# Patient Record
Sex: Female | Born: 1960 | Race: White | Hispanic: No | Marital: Married | Smoking: Former smoker
Health system: Southern US, Community
[De-identification: ages and names within clinical notes are randomized; demographics above are authoritative.]

## PROBLEM LIST (undated history)

## (undated) DIAGNOSIS — F4310 Post-traumatic stress disorder, unspecified: Secondary | ICD-10-CM

## (undated) DIAGNOSIS — F418 Other specified anxiety disorders: Secondary | ICD-10-CM

## (undated) DIAGNOSIS — N200 Calculus of kidney: Secondary | ICD-10-CM

## (undated) DIAGNOSIS — M1990 Unspecified osteoarthritis, unspecified site: Secondary | ICD-10-CM

## (undated) DIAGNOSIS — J189 Pneumonia, unspecified organism: Secondary | ICD-10-CM

## (undated) DIAGNOSIS — Z9889 Other specified postprocedural states: Secondary | ICD-10-CM

## (undated) DIAGNOSIS — J40 Bronchitis, not specified as acute or chronic: Secondary | ICD-10-CM

## (undated) DIAGNOSIS — G2581 Restless legs syndrome: Secondary | ICD-10-CM

## (undated) DIAGNOSIS — N3010 Interstitial cystitis (chronic) without hematuria: Secondary | ICD-10-CM

## (undated) DIAGNOSIS — N19 Unspecified kidney failure: Secondary | ICD-10-CM

## (undated) DIAGNOSIS — D3A020 Benign carcinoid tumor of the appendix: Secondary | ICD-10-CM

## (undated) DIAGNOSIS — J449 Chronic obstructive pulmonary disease, unspecified: Secondary | ICD-10-CM

## (undated) DIAGNOSIS — R112 Nausea with vomiting, unspecified: Secondary | ICD-10-CM

## (undated) DIAGNOSIS — R0602 Shortness of breath: Secondary | ICD-10-CM

## (undated) DIAGNOSIS — M542 Cervicalgia: Secondary | ICD-10-CM

## (undated) DIAGNOSIS — K589 Irritable bowel syndrome without diarrhea: Secondary | ICD-10-CM

## (undated) DIAGNOSIS — C801 Malignant (primary) neoplasm, unspecified: Secondary | ICD-10-CM

## (undated) DIAGNOSIS — F419 Anxiety disorder, unspecified: Secondary | ICD-10-CM

## (undated) DIAGNOSIS — N390 Urinary tract infection, site not specified: Secondary | ICD-10-CM

## (undated) DIAGNOSIS — L659 Nonscarring hair loss, unspecified: Secondary | ICD-10-CM

## (undated) DIAGNOSIS — K219 Gastro-esophageal reflux disease without esophagitis: Secondary | ICD-10-CM

## (undated) DIAGNOSIS — M797 Fibromyalgia: Secondary | ICD-10-CM

## (undated) DIAGNOSIS — I341 Nonrheumatic mitral (valve) prolapse: Secondary | ICD-10-CM

## (undated) DIAGNOSIS — F17200 Nicotine dependence, unspecified, uncomplicated: Secondary | ICD-10-CM

## (undated) HISTORY — DX: Nicotine dependence, unspecified, uncomplicated: F17.200

## (undated) HISTORY — DX: Restless legs syndrome: G25.81

## (undated) HISTORY — DX: Post-traumatic stress disorder, unspecified: F43.10

## (undated) HISTORY — PX: TONSILLECTOMY: SUR1361

## (undated) HISTORY — PX: CHOLECYSTECTOMY: SHX55

## (undated) HISTORY — DX: Other specified anxiety disorders: F41.8

## (undated) HISTORY — DX: Benign carcinoid tumor of the appendix: D3A.020

## (undated) HISTORY — DX: Irritable bowel syndrome, unspecified: K58.9

## (undated) HISTORY — PX: HEMICOLECTOMY: SHX854

## (undated) HISTORY — PX: HAND SURGERY: SHX662

## (undated) HISTORY — PX: COLON SURGERY: SHX602

## (undated) HISTORY — DX: Cervicalgia: M54.2

## (undated) HISTORY — DX: Nonscarring hair loss, unspecified: L65.9

## (undated) HISTORY — PX: CYSTOSTOMY W/ BLADDER DILATION: SHX1432

## (undated) HISTORY — PX: ABDOMINAL HYSTERECTOMY: SHX81

## (undated) HISTORY — DX: Urinary tract infection, site not specified: N39.0

## (undated) HISTORY — PX: ABDOMINAL SURGERY: SHX537

## (undated) HISTORY — PX: CARDIAC CATHETERIZATION: SHX172

## (undated) HISTORY — PX: CARDIAC SURGERY: SHX584

---

## 1998-04-09 ENCOUNTER — Emergency Department (HOSPITAL_COMMUNITY): Admission: EM | Admit: 1998-04-09 | Discharge: 1998-04-09 | Payer: Self-pay | Admitting: *Deleted

## 1998-04-10 ENCOUNTER — Inpatient Hospital Stay (HOSPITAL_COMMUNITY): Admission: EM | Admit: 1998-04-10 | Discharge: 1998-04-16 | Payer: Self-pay | Admitting: Emergency Medicine

## 1998-05-22 ENCOUNTER — Ambulatory Visit (HOSPITAL_COMMUNITY): Admission: RE | Admit: 1998-05-22 | Discharge: 1998-05-22 | Payer: Self-pay | Admitting: Obstetrics and Gynecology

## 1998-07-02 ENCOUNTER — Emergency Department (HOSPITAL_COMMUNITY): Admission: EM | Admit: 1998-07-02 | Discharge: 1998-07-02 | Payer: Self-pay | Admitting: Internal Medicine

## 1998-08-14 ENCOUNTER — Ambulatory Visit (HOSPITAL_COMMUNITY): Admission: RE | Admit: 1998-08-14 | Discharge: 1998-08-14 | Payer: Self-pay | Admitting: Urology

## 1999-01-01 ENCOUNTER — Ambulatory Visit (HOSPITAL_COMMUNITY): Admission: RE | Admit: 1999-01-01 | Discharge: 1999-01-01 | Payer: Self-pay | Admitting: Urology

## 1999-01-03 ENCOUNTER — Inpatient Hospital Stay (HOSPITAL_COMMUNITY): Admission: AD | Admit: 1999-01-03 | Discharge: 1999-01-14 | Payer: Self-pay | Admitting: Specialist

## 1999-01-16 ENCOUNTER — Observation Stay (HOSPITAL_COMMUNITY): Admission: EM | Admit: 1999-01-16 | Discharge: 1999-01-16 | Payer: Self-pay | Admitting: Emergency Medicine

## 1999-02-01 ENCOUNTER — Inpatient Hospital Stay (HOSPITAL_COMMUNITY): Admission: AD | Admit: 1999-02-01 | Discharge: 1999-02-06 | Payer: Self-pay | Admitting: Specialist

## 1999-02-15 ENCOUNTER — Encounter: Payer: Self-pay | Admitting: Orthopedic Surgery

## 1999-02-15 ENCOUNTER — Ambulatory Visit (HOSPITAL_COMMUNITY): Admission: RE | Admit: 1999-02-15 | Discharge: 1999-02-15 | Payer: Self-pay | Admitting: Orthopedic Surgery

## 1999-02-16 ENCOUNTER — Emergency Department (HOSPITAL_COMMUNITY): Admission: EM | Admit: 1999-02-16 | Discharge: 1999-02-16 | Payer: Self-pay | Admitting: Internal Medicine

## 1999-03-25 ENCOUNTER — Other Ambulatory Visit: Admission: RE | Admit: 1999-03-25 | Discharge: 1999-03-25 | Payer: Self-pay | Admitting: Obstetrics and Gynecology

## 1999-04-09 ENCOUNTER — Emergency Department (HOSPITAL_COMMUNITY): Admission: EM | Admit: 1999-04-09 | Discharge: 1999-04-09 | Payer: Self-pay | Admitting: Emergency Medicine

## 1999-04-18 ENCOUNTER — Ambulatory Visit (HOSPITAL_COMMUNITY): Admission: RE | Admit: 1999-04-18 | Discharge: 1999-04-18 | Payer: Self-pay | Admitting: Urology

## 1999-06-03 ENCOUNTER — Emergency Department (HOSPITAL_COMMUNITY): Admission: EM | Admit: 1999-06-03 | Discharge: 1999-06-03 | Payer: Self-pay | Admitting: Emergency Medicine

## 1999-07-11 ENCOUNTER — Inpatient Hospital Stay (HOSPITAL_COMMUNITY): Admission: EM | Admit: 1999-07-11 | Discharge: 1999-07-18 | Payer: Self-pay | Admitting: Emergency Medicine

## 1999-09-24 ENCOUNTER — Ambulatory Visit (HOSPITAL_COMMUNITY): Admission: RE | Admit: 1999-09-24 | Discharge: 1999-09-24 | Payer: Self-pay | Admitting: Urology

## 1999-12-05 ENCOUNTER — Emergency Department (HOSPITAL_COMMUNITY): Admission: EM | Admit: 1999-12-05 | Discharge: 1999-12-05 | Payer: Self-pay | Admitting: Emergency Medicine

## 2000-01-16 ENCOUNTER — Ambulatory Visit (HOSPITAL_COMMUNITY): Admission: RE | Admit: 2000-01-16 | Discharge: 2000-01-16 | Payer: Self-pay | Admitting: Urology

## 2000-01-16 ENCOUNTER — Encounter (INDEPENDENT_AMBULATORY_CARE_PROVIDER_SITE_OTHER): Payer: Self-pay | Admitting: Specialist

## 2000-02-22 ENCOUNTER — Emergency Department (HOSPITAL_COMMUNITY): Admission: EM | Admit: 2000-02-22 | Discharge: 2000-02-22 | Payer: Self-pay | Admitting: Emergency Medicine

## 2000-05-05 ENCOUNTER — Inpatient Hospital Stay (HOSPITAL_COMMUNITY): Admission: AD | Admit: 2000-05-05 | Discharge: 2000-05-05 | Payer: Self-pay | Admitting: Obstetrics and Gynecology

## 2000-05-05 ENCOUNTER — Encounter: Payer: Self-pay | Admitting: Obstetrics and Gynecology

## 2000-07-02 ENCOUNTER — Ambulatory Visit (HOSPITAL_COMMUNITY): Admission: RE | Admit: 2000-07-02 | Discharge: 2000-07-02 | Payer: Self-pay | Admitting: Urology

## 2000-08-18 ENCOUNTER — Emergency Department (HOSPITAL_COMMUNITY): Admission: EM | Admit: 2000-08-18 | Discharge: 2000-08-18 | Payer: Self-pay

## 2000-09-05 ENCOUNTER — Encounter: Payer: Self-pay | Admitting: Emergency Medicine

## 2000-09-05 ENCOUNTER — Emergency Department (HOSPITAL_COMMUNITY): Admission: EM | Admit: 2000-09-05 | Discharge: 2000-09-05 | Payer: Self-pay | Admitting: Emergency Medicine

## 2000-09-16 ENCOUNTER — Ambulatory Visit (HOSPITAL_COMMUNITY): Admission: RE | Admit: 2000-09-16 | Discharge: 2000-09-16 | Payer: Self-pay | Admitting: Gastroenterology

## 2000-09-16 ENCOUNTER — Encounter (INDEPENDENT_AMBULATORY_CARE_PROVIDER_SITE_OTHER): Payer: Self-pay | Admitting: Specialist

## 2000-09-22 ENCOUNTER — Ambulatory Visit (HOSPITAL_COMMUNITY): Admission: RE | Admit: 2000-09-22 | Discharge: 2000-09-22 | Payer: Self-pay | Admitting: Gastroenterology

## 2000-10-13 ENCOUNTER — Emergency Department (HOSPITAL_COMMUNITY): Admission: EM | Admit: 2000-10-13 | Discharge: 2000-10-13 | Payer: Self-pay | Admitting: Emergency Medicine

## 2000-10-13 ENCOUNTER — Encounter: Payer: Self-pay | Admitting: Emergency Medicine

## 2000-10-31 ENCOUNTER — Inpatient Hospital Stay: Admission: EM | Admit: 2000-10-31 | Discharge: 2000-11-03 | Payer: Self-pay | Admitting: Emergency Medicine

## 2001-01-08 ENCOUNTER — Emergency Department (HOSPITAL_COMMUNITY): Admission: EM | Admit: 2001-01-08 | Discharge: 2001-01-08 | Payer: Self-pay | Admitting: Emergency Medicine

## 2001-01-08 ENCOUNTER — Encounter: Payer: Self-pay | Admitting: Emergency Medicine

## 2001-01-10 ENCOUNTER — Emergency Department (HOSPITAL_COMMUNITY): Admission: EM | Admit: 2001-01-10 | Discharge: 2001-01-10 | Payer: Self-pay | Admitting: Internal Medicine

## 2001-01-19 ENCOUNTER — Ambulatory Visit (HOSPITAL_COMMUNITY): Admission: RE | Admit: 2001-01-19 | Discharge: 2001-01-19 | Payer: Self-pay | Admitting: Urology

## 2001-01-20 ENCOUNTER — Encounter: Payer: Self-pay | Admitting: Emergency Medicine

## 2001-01-20 ENCOUNTER — Emergency Department (HOSPITAL_COMMUNITY): Admission: EM | Admit: 2001-01-20 | Discharge: 2001-01-20 | Payer: Self-pay | Admitting: Emergency Medicine

## 2001-04-16 ENCOUNTER — Emergency Department (HOSPITAL_COMMUNITY): Admission: EM | Admit: 2001-04-16 | Discharge: 2001-04-16 | Payer: Self-pay | Admitting: Emergency Medicine

## 2001-05-17 ENCOUNTER — Emergency Department (HOSPITAL_COMMUNITY): Admission: EM | Admit: 2001-05-17 | Discharge: 2001-05-17 | Payer: Self-pay | Admitting: *Deleted

## 2001-05-19 ENCOUNTER — Encounter: Payer: Self-pay | Admitting: Emergency Medicine

## 2001-05-19 ENCOUNTER — Emergency Department (HOSPITAL_COMMUNITY): Admission: EM | Admit: 2001-05-19 | Discharge: 2001-05-19 | Payer: Self-pay | Admitting: Emergency Medicine

## 2001-07-19 ENCOUNTER — Emergency Department (HOSPITAL_COMMUNITY): Admission: EM | Admit: 2001-07-19 | Discharge: 2001-07-19 | Payer: Self-pay | Admitting: Emergency Medicine

## 2001-07-26 ENCOUNTER — Emergency Department (HOSPITAL_COMMUNITY): Admission: EM | Admit: 2001-07-26 | Discharge: 2001-07-26 | Payer: Self-pay | Admitting: Emergency Medicine

## 2001-07-26 ENCOUNTER — Encounter: Payer: Self-pay | Admitting: Emergency Medicine

## 2001-08-17 ENCOUNTER — Emergency Department (HOSPITAL_COMMUNITY): Admission: EM | Admit: 2001-08-17 | Discharge: 2001-08-18 | Payer: Self-pay | Admitting: Emergency Medicine

## 2001-08-17 ENCOUNTER — Emergency Department (HOSPITAL_COMMUNITY): Admission: EM | Admit: 2001-08-17 | Discharge: 2001-08-17 | Payer: Self-pay | Admitting: Emergency Medicine

## 2001-08-21 ENCOUNTER — Emergency Department (HOSPITAL_COMMUNITY): Admission: EM | Admit: 2001-08-21 | Discharge: 2001-08-21 | Payer: Self-pay | Admitting: Emergency Medicine

## 2001-09-21 ENCOUNTER — Ambulatory Visit (HOSPITAL_COMMUNITY): Admission: RE | Admit: 2001-09-21 | Discharge: 2001-09-21 | Payer: Self-pay | Admitting: Urology

## 2001-10-31 ENCOUNTER — Emergency Department (HOSPITAL_COMMUNITY): Admission: EM | Admit: 2001-10-31 | Discharge: 2001-10-31 | Payer: Self-pay | Admitting: *Deleted

## 2001-10-31 ENCOUNTER — Encounter: Payer: Self-pay | Admitting: *Deleted

## 2002-01-04 ENCOUNTER — Emergency Department (HOSPITAL_COMMUNITY): Admission: EM | Admit: 2002-01-04 | Discharge: 2002-01-04 | Payer: Self-pay | Admitting: Emergency Medicine

## 2002-01-14 ENCOUNTER — Emergency Department (HOSPITAL_COMMUNITY): Admission: EM | Admit: 2002-01-14 | Discharge: 2002-01-14 | Payer: Self-pay | Admitting: Emergency Medicine

## 2002-02-04 ENCOUNTER — Emergency Department (HOSPITAL_COMMUNITY): Admission: EM | Admit: 2002-02-04 | Discharge: 2002-02-04 | Payer: Self-pay | Admitting: *Deleted

## 2002-02-10 ENCOUNTER — Emergency Department (HOSPITAL_COMMUNITY): Admission: EM | Admit: 2002-02-10 | Discharge: 2002-02-10 | Payer: Self-pay | Admitting: *Deleted

## 2002-06-26 ENCOUNTER — Emergency Department (HOSPITAL_COMMUNITY): Admission: EM | Admit: 2002-06-26 | Discharge: 2002-06-26 | Payer: Self-pay | Admitting: Emergency Medicine

## 2004-04-25 ENCOUNTER — Ambulatory Visit (HOSPITAL_COMMUNITY): Admission: RE | Admit: 2004-04-25 | Discharge: 2004-04-25 | Payer: Self-pay | Admitting: Urology

## 2004-10-10 ENCOUNTER — Ambulatory Visit (HOSPITAL_COMMUNITY): Admission: RE | Admit: 2004-10-10 | Discharge: 2004-10-10 | Payer: Self-pay | Admitting: Urology

## 2004-12-22 ENCOUNTER — Emergency Department (HOSPITAL_COMMUNITY): Admission: EM | Admit: 2004-12-22 | Discharge: 2004-12-22 | Payer: Self-pay | Admitting: Emergency Medicine

## 2005-02-05 ENCOUNTER — Other Ambulatory Visit: Admission: RE | Admit: 2005-02-05 | Discharge: 2005-02-05 | Payer: Self-pay | Admitting: Obstetrics and Gynecology

## 2005-02-27 ENCOUNTER — Ambulatory Visit (HOSPITAL_COMMUNITY): Admission: RE | Admit: 2005-02-27 | Discharge: 2005-02-27 | Payer: Self-pay | Admitting: Obstetrics and Gynecology

## 2005-02-27 ENCOUNTER — Encounter (INDEPENDENT_AMBULATORY_CARE_PROVIDER_SITE_OTHER): Payer: Self-pay | Admitting: *Deleted

## 2005-08-26 ENCOUNTER — Emergency Department (HOSPITAL_COMMUNITY): Admission: EM | Admit: 2005-08-26 | Discharge: 2005-08-26 | Payer: Self-pay | Admitting: Emergency Medicine

## 2005-10-12 ENCOUNTER — Emergency Department (HOSPITAL_COMMUNITY): Admission: EM | Admit: 2005-10-12 | Discharge: 2005-10-12 | Payer: Self-pay | Admitting: Emergency Medicine

## 2005-10-30 ENCOUNTER — Ambulatory Visit (HOSPITAL_COMMUNITY): Admission: RE | Admit: 2005-10-30 | Discharge: 2005-10-30 | Payer: Self-pay | Admitting: Internal Medicine

## 2005-11-05 ENCOUNTER — Emergency Department (HOSPITAL_COMMUNITY): Admission: EM | Admit: 2005-11-05 | Discharge: 2005-11-05 | Payer: Self-pay | Admitting: Emergency Medicine

## 2005-11-14 ENCOUNTER — Emergency Department (HOSPITAL_COMMUNITY): Admission: EM | Admit: 2005-11-14 | Discharge: 2005-11-14 | Payer: Self-pay | Admitting: Emergency Medicine

## 2005-11-23 ENCOUNTER — Emergency Department (HOSPITAL_COMMUNITY): Admission: EM | Admit: 2005-11-23 | Discharge: 2005-11-23 | Payer: Self-pay | Admitting: Emergency Medicine

## 2005-12-03 ENCOUNTER — Ambulatory Visit: Payer: Self-pay | Admitting: Orthopedic Surgery

## 2005-12-12 ENCOUNTER — Ambulatory Visit: Payer: Self-pay | Admitting: Physical Medicine & Rehabilitation

## 2005-12-12 ENCOUNTER — Encounter
Admission: RE | Admit: 2005-12-12 | Discharge: 2006-03-12 | Payer: Self-pay | Admitting: Physical Medicine & Rehabilitation

## 2006-01-12 ENCOUNTER — Ambulatory Visit: Payer: Self-pay | Admitting: Physical Medicine & Rehabilitation

## 2006-02-16 ENCOUNTER — Emergency Department (HOSPITAL_COMMUNITY): Admission: EM | Admit: 2006-02-16 | Discharge: 2006-02-16 | Payer: Self-pay | Admitting: Emergency Medicine

## 2006-02-28 ENCOUNTER — Emergency Department (HOSPITAL_COMMUNITY): Admission: EM | Admit: 2006-02-28 | Discharge: 2006-02-28 | Payer: Self-pay | Admitting: Emergency Medicine

## 2006-03-19 ENCOUNTER — Encounter
Admission: RE | Admit: 2006-03-19 | Discharge: 2006-06-17 | Payer: Self-pay | Admitting: Physical Medicine & Rehabilitation

## 2006-03-24 ENCOUNTER — Ambulatory Visit: Payer: Self-pay | Admitting: Physical Medicine & Rehabilitation

## 2006-04-12 ENCOUNTER — Emergency Department (HOSPITAL_COMMUNITY): Admission: EM | Admit: 2006-04-12 | Discharge: 2006-04-12 | Payer: Self-pay | Admitting: Emergency Medicine

## 2006-04-13 ENCOUNTER — Emergency Department (HOSPITAL_COMMUNITY): Admission: EM | Admit: 2006-04-13 | Discharge: 2006-04-13 | Payer: Self-pay | Admitting: Emergency Medicine

## 2006-04-14 ENCOUNTER — Observation Stay (HOSPITAL_COMMUNITY): Admission: AD | Admit: 2006-04-14 | Discharge: 2006-04-18 | Payer: Self-pay | Admitting: Internal Medicine

## 2006-04-16 ENCOUNTER — Ambulatory Visit: Payer: Self-pay | Admitting: Orthopedic Surgery

## 2006-04-16 ENCOUNTER — Encounter (INDEPENDENT_AMBULATORY_CARE_PROVIDER_SITE_OTHER): Payer: Self-pay | Admitting: *Deleted

## 2006-04-30 ENCOUNTER — Emergency Department (HOSPITAL_COMMUNITY): Admission: EM | Admit: 2006-04-30 | Discharge: 2006-04-30 | Payer: Self-pay | Admitting: Emergency Medicine

## 2006-05-19 ENCOUNTER — Encounter: Admission: RE | Admit: 2006-05-19 | Discharge: 2006-05-19 | Payer: Self-pay | Admitting: Internal Medicine

## 2006-06-13 ENCOUNTER — Emergency Department (HOSPITAL_COMMUNITY): Admission: EM | Admit: 2006-06-13 | Discharge: 2006-06-13 | Payer: Self-pay | Admitting: Emergency Medicine

## 2006-06-20 ENCOUNTER — Emergency Department (HOSPITAL_COMMUNITY): Admission: EM | Admit: 2006-06-20 | Discharge: 2006-06-20 | Payer: Self-pay | Admitting: Emergency Medicine

## 2006-08-16 ENCOUNTER — Emergency Department (HOSPITAL_COMMUNITY): Admission: EM | Admit: 2006-08-16 | Discharge: 2006-08-16 | Payer: Self-pay | Admitting: Emergency Medicine

## 2006-09-12 ENCOUNTER — Emergency Department (HOSPITAL_COMMUNITY): Admission: EM | Admit: 2006-09-12 | Discharge: 2006-09-12 | Payer: Self-pay | Admitting: Emergency Medicine

## 2006-09-27 ENCOUNTER — Emergency Department (HOSPITAL_COMMUNITY): Admission: EM | Admit: 2006-09-27 | Discharge: 2006-09-27 | Payer: Self-pay | Admitting: Emergency Medicine

## 2006-10-02 ENCOUNTER — Emergency Department (HOSPITAL_COMMUNITY): Admission: EM | Admit: 2006-10-02 | Discharge: 2006-10-02 | Payer: Self-pay | Admitting: Emergency Medicine

## 2006-10-15 ENCOUNTER — Emergency Department (HOSPITAL_COMMUNITY): Admission: EM | Admit: 2006-10-15 | Discharge: 2006-10-16 | Payer: Self-pay | Admitting: Emergency Medicine

## 2006-10-27 ENCOUNTER — Ambulatory Visit: Payer: Self-pay | Admitting: Orthopedic Surgery

## 2006-10-28 ENCOUNTER — Ambulatory Visit: Payer: Self-pay | Admitting: Internal Medicine

## 2006-10-30 ENCOUNTER — Ambulatory Visit: Payer: Self-pay | Admitting: Cardiology

## 2006-11-07 ENCOUNTER — Emergency Department (HOSPITAL_COMMUNITY): Admission: EM | Admit: 2006-11-07 | Discharge: 2006-11-07 | Payer: Self-pay | Admitting: Emergency Medicine

## 2006-12-04 ENCOUNTER — Ambulatory Visit: Payer: Self-pay | Admitting: Psychiatry

## 2006-12-05 ENCOUNTER — Inpatient Hospital Stay (HOSPITAL_COMMUNITY): Admission: EM | Admit: 2006-12-05 | Discharge: 2006-12-08 | Payer: Self-pay | Admitting: Psychiatry

## 2006-12-16 ENCOUNTER — Inpatient Hospital Stay (HOSPITAL_COMMUNITY): Admission: RE | Admit: 2006-12-16 | Discharge: 2006-12-24 | Payer: Self-pay | Admitting: Psychiatry

## 2007-01-07 ENCOUNTER — Inpatient Hospital Stay (HOSPITAL_COMMUNITY): Admission: RE | Admit: 2007-01-07 | Discharge: 2007-01-13 | Payer: Self-pay | Admitting: Psychiatry

## 2007-01-07 ENCOUNTER — Ambulatory Visit: Payer: Self-pay | Admitting: Psychiatry

## 2007-02-04 ENCOUNTER — Emergency Department (HOSPITAL_COMMUNITY): Admission: EM | Admit: 2007-02-04 | Discharge: 2007-02-04 | Payer: Self-pay | Admitting: Emergency Medicine

## 2007-02-05 ENCOUNTER — Emergency Department (HOSPITAL_COMMUNITY): Admission: EM | Admit: 2007-02-05 | Discharge: 2007-02-05 | Payer: Self-pay | Admitting: Emergency Medicine

## 2007-02-09 ENCOUNTER — Ambulatory Visit: Payer: Self-pay | Admitting: Internal Medicine

## 2007-02-10 ENCOUNTER — Inpatient Hospital Stay (HOSPITAL_COMMUNITY): Admission: EM | Admit: 2007-02-10 | Discharge: 2007-02-11 | Payer: Self-pay | Admitting: Emergency Medicine

## 2007-02-23 ENCOUNTER — Inpatient Hospital Stay (HOSPITAL_COMMUNITY): Admission: EM | Admit: 2007-02-23 | Discharge: 2007-03-04 | Payer: Self-pay | Admitting: Psychiatry

## 2007-02-23 ENCOUNTER — Ambulatory Visit: Payer: Self-pay | Admitting: Psychiatry

## 2007-03-07 ENCOUNTER — Inpatient Hospital Stay (HOSPITAL_COMMUNITY): Admission: EM | Admit: 2007-03-07 | Discharge: 2007-03-11 | Payer: Self-pay | Admitting: Psychiatry

## 2007-03-11 ENCOUNTER — Encounter (INDEPENDENT_AMBULATORY_CARE_PROVIDER_SITE_OTHER): Payer: Self-pay | Admitting: Internal Medicine

## 2007-03-11 ENCOUNTER — Emergency Department (HOSPITAL_COMMUNITY): Admission: EM | Admit: 2007-03-11 | Discharge: 2007-03-11 | Payer: Self-pay | Admitting: Emergency Medicine

## 2007-03-11 LAB — CONVERTED CEMR LAB
ALT: 18 units/L
AST: 18 units/L
Albumin: 3.9 g/dL
BUN: 6 mg/dL
Basophils Absolute: 0 10*3/uL
Basophils Relative: 0 %
CO2: 28 meq/L
Calcium: 9.1 mg/dL
Chloride: 97 meq/L
Glucose, Bld: 100 mg/dL
Hemoglobin: 12.5 g/dL
Lipase: 15 units/L
MCHC: 34.2 g/dL
Monocytes Absolute: 0.5 10*3/uL
Neutro Abs: 6.2 10*3/uL
Neutrophils Relative %: 64 %
Platelets: 351 10*3/uL
Potassium: 3.7 meq/L
RDW: 14 %
Sodium: 133 meq/L
Total Protein: 6.8 g/dL

## 2007-04-20 ENCOUNTER — Ambulatory Visit: Payer: Self-pay | Admitting: Internal Medicine

## 2007-04-26 ENCOUNTER — Ambulatory Visit (HOSPITAL_COMMUNITY): Admission: RE | Admit: 2007-04-26 | Discharge: 2007-04-26 | Payer: Self-pay | Admitting: Neurology

## 2007-05-12 ENCOUNTER — Ambulatory Visit: Payer: Self-pay | Admitting: Internal Medicine

## 2007-05-12 DIAGNOSIS — J45909 Unspecified asthma, uncomplicated: Secondary | ICD-10-CM | POA: Insufficient documentation

## 2007-05-12 DIAGNOSIS — J449 Chronic obstructive pulmonary disease, unspecified: Secondary | ICD-10-CM

## 2007-05-12 DIAGNOSIS — F1920 Other psychoactive substance dependence, uncomplicated: Secondary | ICD-10-CM | POA: Insufficient documentation

## 2007-05-12 DIAGNOSIS — J309 Allergic rhinitis, unspecified: Secondary | ICD-10-CM | POA: Insufficient documentation

## 2007-05-12 DIAGNOSIS — M545 Low back pain: Secondary | ICD-10-CM

## 2007-05-12 DIAGNOSIS — F411 Generalized anxiety disorder: Secondary | ICD-10-CM | POA: Insufficient documentation

## 2007-05-12 DIAGNOSIS — K219 Gastro-esophageal reflux disease without esophagitis: Secondary | ICD-10-CM | POA: Insufficient documentation

## 2007-05-12 DIAGNOSIS — J4489 Other specified chronic obstructive pulmonary disease: Secondary | ICD-10-CM | POA: Insufficient documentation

## 2007-05-12 DIAGNOSIS — F329 Major depressive disorder, single episode, unspecified: Secondary | ICD-10-CM

## 2007-05-13 ENCOUNTER — Encounter (INDEPENDENT_AMBULATORY_CARE_PROVIDER_SITE_OTHER): Payer: Self-pay | Admitting: Internal Medicine

## 2007-05-23 ENCOUNTER — Emergency Department (HOSPITAL_COMMUNITY): Admission: EM | Admit: 2007-05-23 | Discharge: 2007-05-23 | Payer: Self-pay | Admitting: Emergency Medicine

## 2007-05-31 ENCOUNTER — Ambulatory Visit: Payer: Self-pay | Admitting: Cardiology

## 2007-05-31 ENCOUNTER — Inpatient Hospital Stay (HOSPITAL_COMMUNITY): Admission: EM | Admit: 2007-05-31 | Discharge: 2007-06-03 | Payer: Self-pay | Admitting: Emergency Medicine

## 2007-07-05 ENCOUNTER — Ambulatory Visit (HOSPITAL_COMMUNITY): Admission: RE | Admit: 2007-07-05 | Discharge: 2007-07-05 | Payer: Self-pay | Admitting: Family Medicine

## 2007-07-25 ENCOUNTER — Emergency Department (HOSPITAL_COMMUNITY): Admission: EM | Admit: 2007-07-25 | Discharge: 2007-07-25 | Payer: Self-pay | Admitting: Emergency Medicine

## 2007-07-27 ENCOUNTER — Emergency Department (HOSPITAL_COMMUNITY): Admission: EM | Admit: 2007-07-27 | Discharge: 2007-07-27 | Payer: Self-pay | Admitting: Emergency Medicine

## 2007-09-20 ENCOUNTER — Emergency Department (HOSPITAL_COMMUNITY): Admission: EM | Admit: 2007-09-20 | Discharge: 2007-09-21 | Payer: Self-pay | Admitting: Emergency Medicine

## 2007-10-11 ENCOUNTER — Other Ambulatory Visit: Payer: Self-pay

## 2007-10-11 ENCOUNTER — Other Ambulatory Visit: Payer: Self-pay | Admitting: Emergency Medicine

## 2007-10-12 ENCOUNTER — Inpatient Hospital Stay (HOSPITAL_COMMUNITY): Admission: EM | Admit: 2007-10-12 | Discharge: 2007-10-18 | Payer: Self-pay | Admitting: Psychiatry

## 2007-10-12 ENCOUNTER — Ambulatory Visit: Payer: Self-pay | Admitting: Psychiatry

## 2007-10-15 ENCOUNTER — Encounter (HOSPITAL_COMMUNITY): Payer: Self-pay | Admitting: Psychiatry

## 2007-10-19 ENCOUNTER — Emergency Department (HOSPITAL_COMMUNITY): Admission: EM | Admit: 2007-10-19 | Discharge: 2007-10-19 | Payer: Self-pay | Admitting: *Deleted

## 2007-11-17 ENCOUNTER — Ambulatory Visit (HOSPITAL_COMMUNITY): Admission: RE | Admit: 2007-11-17 | Discharge: 2007-11-17 | Payer: Self-pay | Admitting: Family Medicine

## 2007-11-30 ENCOUNTER — Emergency Department (HOSPITAL_COMMUNITY): Admission: EM | Admit: 2007-11-30 | Discharge: 2007-11-30 | Payer: Self-pay | Admitting: Emergency Medicine

## 2008-01-06 ENCOUNTER — Encounter: Payer: Self-pay | Admitting: Internal Medicine

## 2008-01-26 ENCOUNTER — Ambulatory Visit (HOSPITAL_COMMUNITY): Admission: RE | Admit: 2008-01-26 | Discharge: 2008-01-26 | Payer: Self-pay | Admitting: Family Medicine

## 2008-02-16 ENCOUNTER — Emergency Department (HOSPITAL_COMMUNITY): Admission: EM | Admit: 2008-02-16 | Discharge: 2008-02-16 | Payer: Self-pay | Admitting: Emergency Medicine

## 2008-03-18 ENCOUNTER — Inpatient Hospital Stay (HOSPITAL_COMMUNITY): Admission: AD | Admit: 2008-03-18 | Discharge: 2008-03-22 | Payer: Self-pay | Admitting: *Deleted

## 2008-03-18 ENCOUNTER — Encounter: Payer: Self-pay | Admitting: Emergency Medicine

## 2008-03-20 ENCOUNTER — Ambulatory Visit: Payer: Self-pay | Admitting: *Deleted

## 2008-04-21 ENCOUNTER — Encounter: Payer: Self-pay | Admitting: Internal Medicine

## 2008-08-10 ENCOUNTER — Emergency Department (HOSPITAL_COMMUNITY): Admission: EM | Admit: 2008-08-10 | Discharge: 2008-08-10 | Payer: Self-pay | Admitting: Emergency Medicine

## 2008-09-13 ENCOUNTER — Emergency Department (HOSPITAL_COMMUNITY): Admission: EM | Admit: 2008-09-13 | Discharge: 2008-09-13 | Payer: Self-pay | Admitting: Emergency Medicine

## 2008-10-09 IMAGING — CT CT ABDOMEN WO/W CM
3 of 6 series · 12 of 46 positions shown, 17 images · IV contrast (Omnipaque 300)
Comparison: 04/15/06.

CLINICAL DATA: 45 year old with left lower quadrant abdominal pain for two months.  The patient has a history of total abdominal hysterectomy and bilateral oophorectomy.
 ABDOMEN CT WITHOUT AND WITH CONTRAST ? 07/05/07:
TECHNIQUE: Multidetector CT imaging of the abdomen was performed following the standard protocol both before and during bolus administration of intravenous contrast.
 Contrast:  100 cc Omnipaque 300 IV.
TECHNIQUE: Multidetector CT imaging of the pelvis was performed following the standard protocol both before and during bolus administration of intravenous contrast.

[Series 3: abd_pel_with 5.0 b40f · axial · 0.64mm/px · z∈[-442,-67]mm · 8 of 97 slices shown, 13 images]
[im 11/97  soft-tissue]
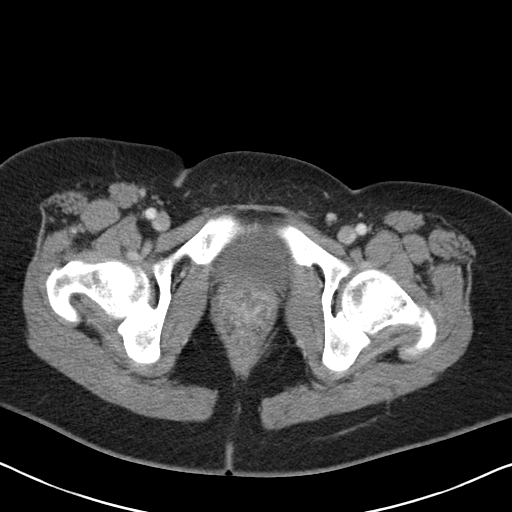
[im 11/97  bone]
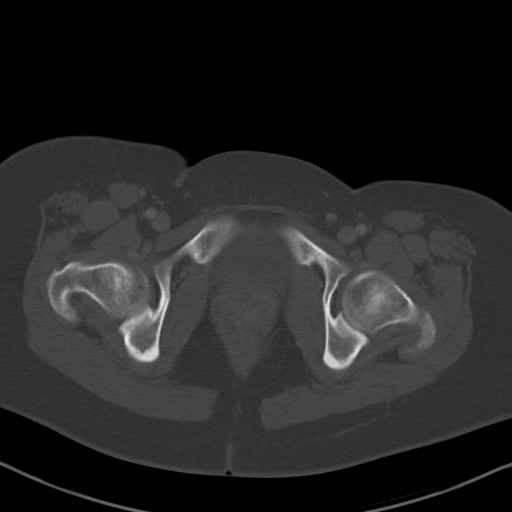
[im 22/97  soft-tissue]
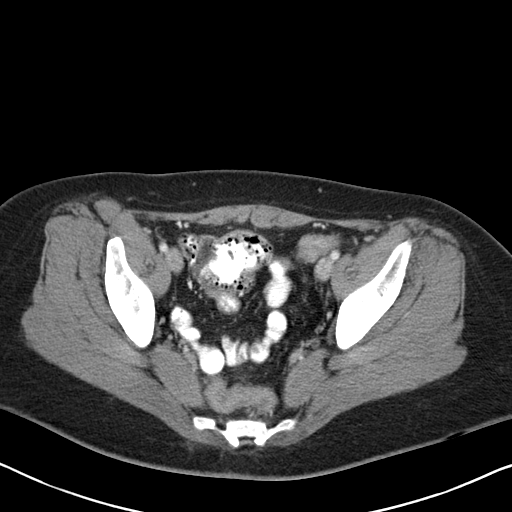
[im 33/97  soft-tissue]
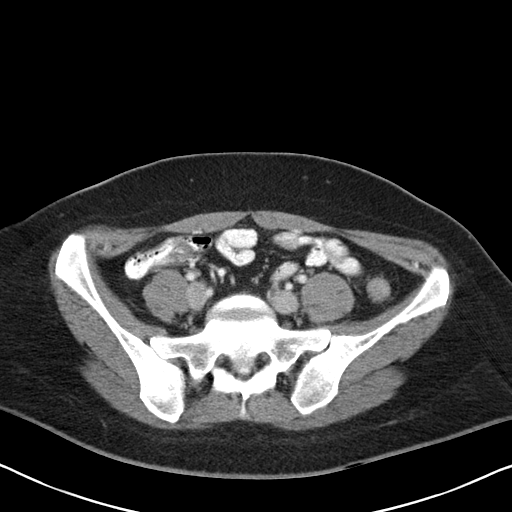
[im 43/97  soft-tissue]
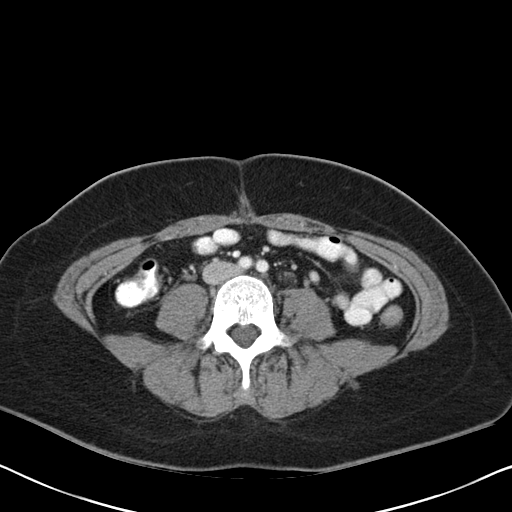
[im 54/97  soft-tissue]
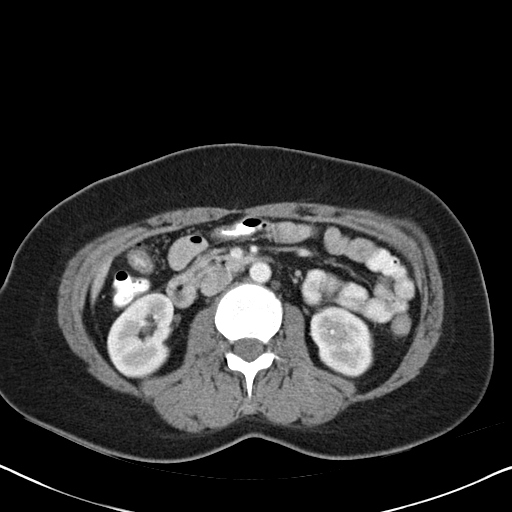
[im 54/97  lung]
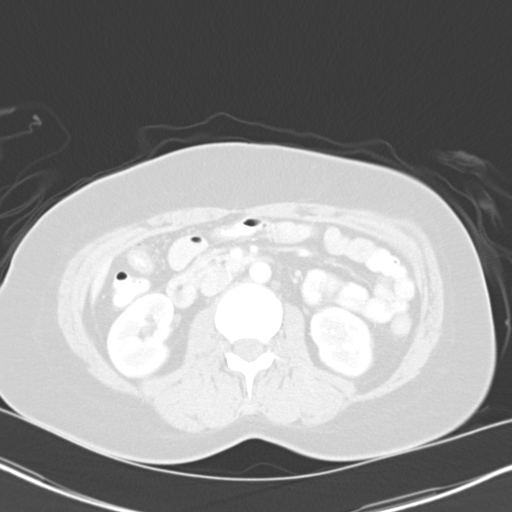
[im 65/97  soft-tissue]
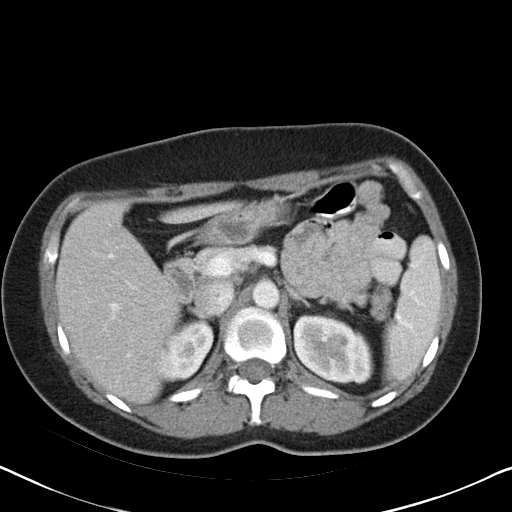
[im 65/97  lung]
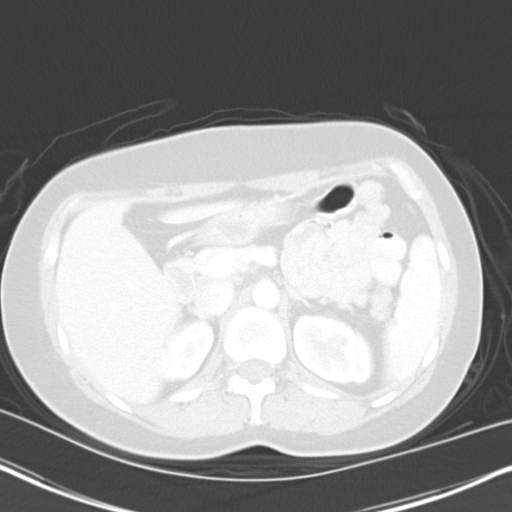
[im 75/97  soft-tissue]
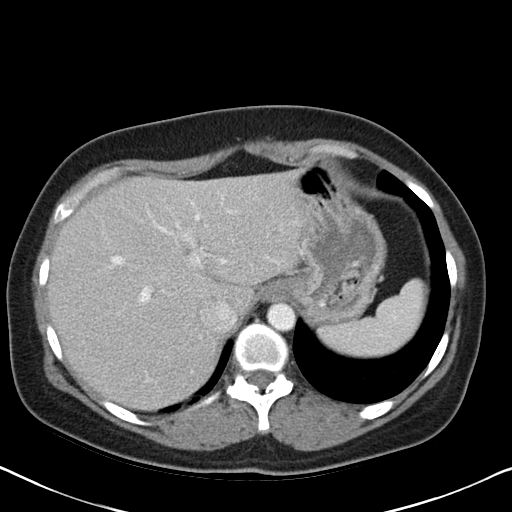
[im 75/97  lung]
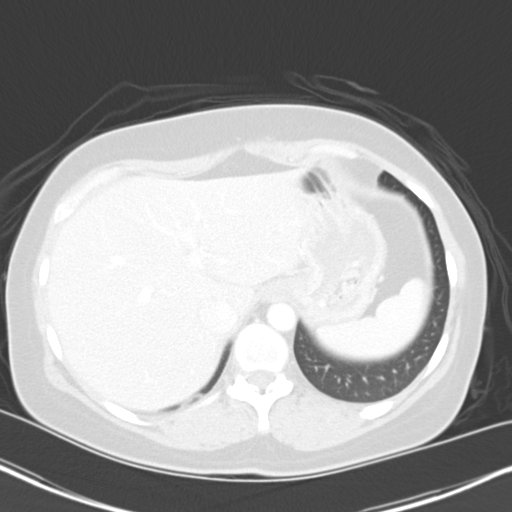
[im 86/97  soft-tissue]
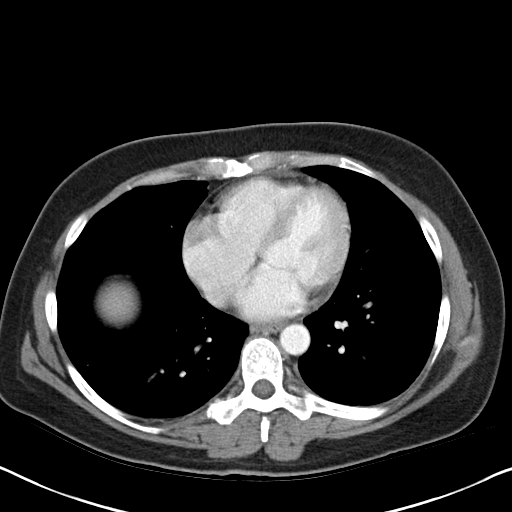
[im 86/97  lung]
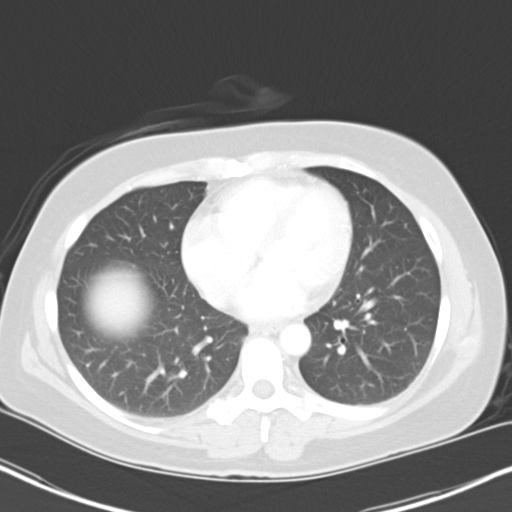

[Series 5: mpr coronal a/p cor · coronal · 0.87mm/px · 3 of 73 slices shown]
[im 25/73  soft-tissue]
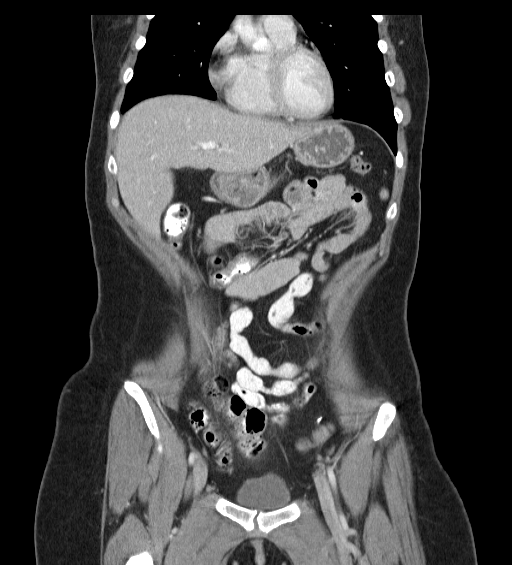
[im 33/73  soft-tissue]
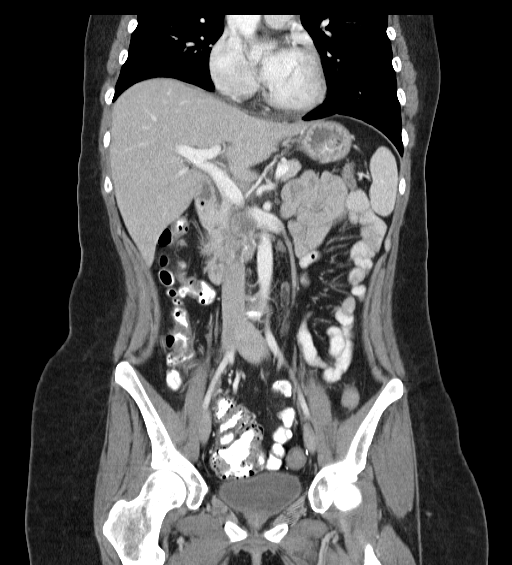
[im 41/73  soft-tissue]
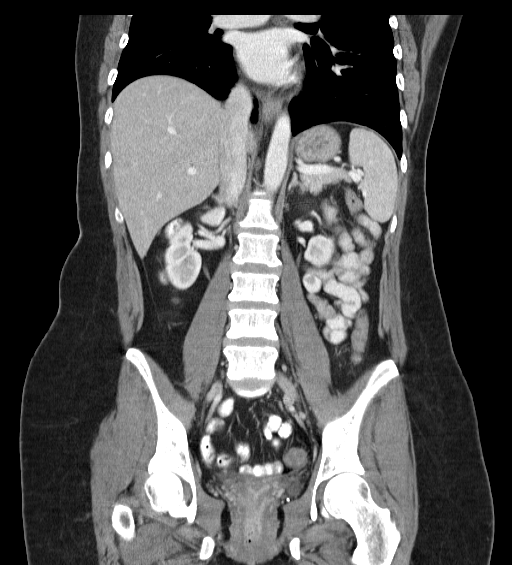

[Series 6: mpr sagittal a/p sag · sagittal · 0.58mm/px · 1 of 105 slices shown]
[im 35/105  soft-tissue]
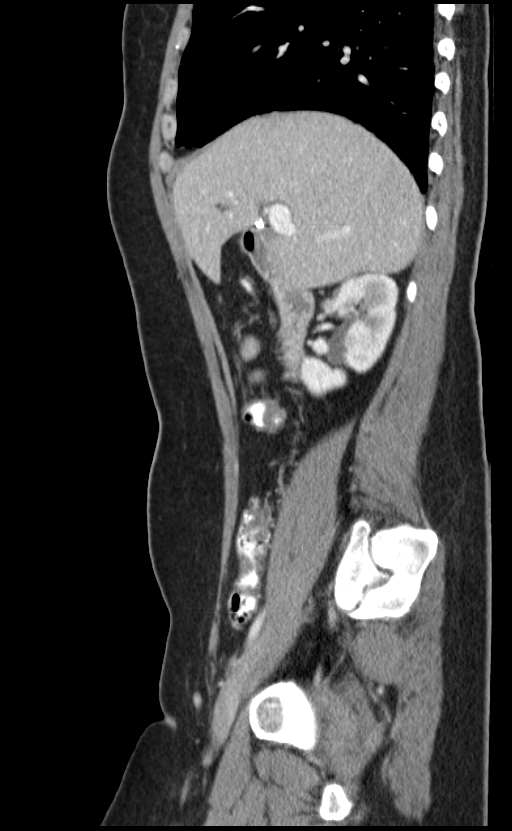

[12 of 46 positions shown; findings below may reference images not displayed]

FINDINGS: The lung bases are clear.  The heart size is normal.   No pericardial effusion.  The distal descending thoracic aorta is normal in caliber and the distal esophagus is unremarkable.
 CT Abdomen:  The patient has had a cholecystectomy.  There is no intra- or extrahepatic biliary dilatation.  The liver demonstrates no focal lesions.  The spleen is normal in size.  The pancreas, adrenal glands, and kidneys demonstrate no significant findings. 
 The aorta is normal in caliber.  No dissection.  Major branch vessels are normal.  There are no mesenteric or retroperitoneal masses or adenopathy.
 The stomach is not well distended with contrast but no gross abnormalities are seen.  The duodenum, small bowel, and colon demonstrate no significant findings.
 No significant bony findings.
IMPRESSION: 1.  Unremarkable CT examination of the abdomen.  No acute abdominal findings, mass lesions, or adenopathy.
 2.  Status post cholecystectomy.
 PELVIS CT WITHOUT AND WITH CONTRAST ? 07/05/07:
FINDINGS: The rectum, sigmoid colon, and visualized small bowel loops are unremarkable.   The appendix is visualized and is normal.  There is a low-lying cecum in the midline of the low pelvis.  The terminal ileum is normal.  The patient has had a hysterectomy and bilateral oophorectomy.  No pelvic masses, adenopathy, or free pelvic fluid collections.   Major vascular structures are normal.  No significant bony findings.  No inguinal adenopathy.  No inguinal hernia is seen.
IMPRESSION: No acute abdominal findings, mass lesions, or adenopathy.

## 2008-12-13 ENCOUNTER — Emergency Department (HOSPITAL_COMMUNITY): Admission: EM | Admit: 2008-12-13 | Discharge: 2008-12-13 | Payer: Self-pay | Admitting: Emergency Medicine

## 2009-03-16 ENCOUNTER — Emergency Department (HOSPITAL_COMMUNITY): Admission: EM | Admit: 2009-03-16 | Discharge: 2009-03-17 | Payer: Self-pay | Admitting: Unknown Physician Specialty

## 2009-03-22 ENCOUNTER — Emergency Department (HOSPITAL_COMMUNITY): Admission: EM | Admit: 2009-03-22 | Discharge: 2009-03-23 | Payer: Self-pay | Admitting: Emergency Medicine

## 2009-04-23 ENCOUNTER — Emergency Department (HOSPITAL_COMMUNITY): Admission: EM | Admit: 2009-04-23 | Discharge: 2009-04-23 | Payer: Self-pay | Admitting: Emergency Medicine

## 2009-05-28 ENCOUNTER — Emergency Department (HOSPITAL_COMMUNITY): Admission: EM | Admit: 2009-05-28 | Discharge: 2009-05-28 | Payer: Self-pay | Admitting: Emergency Medicine

## 2009-08-27 ENCOUNTER — Emergency Department (HOSPITAL_COMMUNITY): Admission: EM | Admit: 2009-08-27 | Discharge: 2009-08-27 | Payer: Self-pay | Admitting: Emergency Medicine

## 2009-08-29 ENCOUNTER — Emergency Department (HOSPITAL_COMMUNITY): Admission: EM | Admit: 2009-08-29 | Discharge: 2009-08-29 | Payer: Self-pay | Admitting: Emergency Medicine

## 2009-08-31 ENCOUNTER — Emergency Department (HOSPITAL_COMMUNITY): Admission: EM | Admit: 2009-08-31 | Discharge: 2009-08-31 | Payer: Self-pay | Admitting: Emergency Medicine

## 2009-10-13 ENCOUNTER — Emergency Department (HOSPITAL_COMMUNITY): Admission: EM | Admit: 2009-10-13 | Discharge: 2009-10-13 | Payer: Self-pay | Admitting: Emergency Medicine

## 2009-10-15 ENCOUNTER — Emergency Department (HOSPITAL_COMMUNITY): Admission: EM | Admit: 2009-10-15 | Discharge: 2009-10-15 | Payer: Self-pay | Admitting: Emergency Medicine

## 2009-11-03 ENCOUNTER — Observation Stay (HOSPITAL_COMMUNITY): Admission: EM | Admit: 2009-11-03 | Discharge: 2009-11-05 | Payer: Self-pay | Admitting: Emergency Medicine

## 2009-11-03 ENCOUNTER — Ambulatory Visit: Payer: Self-pay | Admitting: Internal Medicine

## 2009-12-17 ENCOUNTER — Ambulatory Visit (HOSPITAL_COMMUNITY): Admission: RE | Admit: 2009-12-17 | Discharge: 2009-12-17 | Payer: Self-pay | Admitting: Neurology

## 2010-01-08 ENCOUNTER — Emergency Department (HOSPITAL_COMMUNITY): Admission: EM | Admit: 2010-01-08 | Discharge: 2010-01-08 | Payer: Self-pay | Admitting: Emergency Medicine

## 2010-02-21 ENCOUNTER — Emergency Department (HOSPITAL_COMMUNITY): Admission: EM | Admit: 2010-02-21 | Discharge: 2010-02-21 | Payer: Self-pay | Admitting: Emergency Medicine

## 2010-03-26 ENCOUNTER — Emergency Department: Payer: Self-pay | Admitting: Emergency Medicine

## 2010-05-29 ENCOUNTER — Emergency Department (HOSPITAL_COMMUNITY): Admission: EM | Admit: 2010-05-29 | Discharge: 2010-05-29 | Payer: Self-pay | Admitting: Emergency Medicine

## 2010-06-26 ENCOUNTER — Emergency Department (HOSPITAL_COMMUNITY): Admission: EM | Admit: 2010-06-26 | Discharge: 2010-06-26 | Payer: Self-pay | Admitting: Emergency Medicine

## 2010-08-08 ENCOUNTER — Ambulatory Visit (HOSPITAL_COMMUNITY): Admission: RE | Admit: 2010-08-08 | Discharge: 2010-08-08 | Payer: Self-pay | Admitting: Pulmonary Disease

## 2010-09-11 ENCOUNTER — Ambulatory Visit (HOSPITAL_COMMUNITY): Admission: RE | Admit: 2010-09-11 | Discharge: 2010-09-11 | Payer: Self-pay | Admitting: Pulmonary Disease

## 2010-09-14 ENCOUNTER — Inpatient Hospital Stay (HOSPITAL_COMMUNITY)
Admission: EM | Admit: 2010-09-14 | Discharge: 2010-09-19 | Payer: Self-pay | Source: Home / Self Care | Admitting: Emergency Medicine

## 2010-10-24 ENCOUNTER — Emergency Department (HOSPITAL_COMMUNITY): Admission: EM | Admit: 2010-10-24 | Discharge: 2010-10-24 | Payer: Self-pay | Admitting: Emergency Medicine

## 2010-11-28 ENCOUNTER — Emergency Department (HOSPITAL_COMMUNITY)
Admission: EM | Admit: 2010-11-28 | Discharge: 2010-11-29 | Payer: Self-pay | Source: Home / Self Care | Admitting: Emergency Medicine

## 2011-01-01 ENCOUNTER — Emergency Department (HOSPITAL_COMMUNITY)
Admission: EM | Admit: 2011-01-01 | Discharge: 2011-01-01 | Payer: Self-pay | Source: Home / Self Care | Admitting: Emergency Medicine

## 2011-01-02 ENCOUNTER — Emergency Department (HOSPITAL_COMMUNITY)
Admission: EM | Admit: 2011-01-02 | Discharge: 2011-01-03 | Payer: Self-pay | Source: Home / Self Care | Admitting: Emergency Medicine

## 2011-01-03 ENCOUNTER — Inpatient Hospital Stay (HOSPITAL_COMMUNITY)
Admission: RE | Admit: 2011-01-03 | Discharge: 2011-01-10 | Payer: Self-pay | Source: Home / Self Care | Attending: Pulmonary Disease | Admitting: Pulmonary Disease

## 2011-01-13 LAB — COMPREHENSIVE METABOLIC PANEL
ALT: 13 U/L (ref 0–35)
AST: 18 U/L (ref 0–37)
Albumin: 3.5 g/dL (ref 3.5–5.2)
Alkaline Phosphatase: 56 U/L (ref 39–117)
BUN: 8 mg/dL (ref 6–23)
CO2: 24 mEq/L (ref 19–32)
Calcium: 8.4 mg/dL (ref 8.4–10.5)
Chloride: 109 mEq/L (ref 96–112)
Creatinine, Ser: 0.91 mg/dL (ref 0.4–1.2)
GFR calc Af Amer: >60 mL/min (ref >60)
GFR calc non Af Amer: >60 mL/min (ref >60)
Glucose, Bld: 154 mg/dL — ABNORMAL HIGH (ref 70–99)
Potassium: 3.4 mEq/L — ABNORMAL LOW (ref 3.5–5.1)
Sodium: 140 mEq/L (ref 135–145)
Total Bilirubin: 0.3 mg/dL (ref 0.3–1.2)
Total Protein: 5.6 g/dL — ABNORMAL LOW (ref 6.0–8.3)

## 2011-01-13 LAB — GLUCOSE, CAPILLARY: Glucose-Capillary: 108 mg/dL — ABNORMAL HIGH (ref 70–99)

## 2011-01-13 LAB — CBC
HCT: 32.7 % — ABNORMAL LOW (ref 36.0–46.0)
Hemoglobin: 11.7 g/dL — ABNORMAL LOW (ref 12.0–15.0)
MCH: 31.4 pg (ref 26.0–34.0)
MCHC: 35.8 g/dL (ref 30.0–36.0)
MCV: 87.7 fL (ref 78.0–100.0)
Platelets: 251 10*3/uL (ref 150–400)
RBC: 3.73 MIL/uL — ABNORMAL LOW (ref 3.87–5.11)
RDW: 12.9 % (ref 11.5–15.5)
WBC: 14.2 10*3/uL — ABNORMAL HIGH (ref 4.0–10.5)

## 2011-01-13 LAB — DIFFERENTIAL
Basophils Absolute: 0 10*3/uL (ref 0.0–0.1)
Basophils Relative: 0 % (ref 0–1)
Eosinophils Absolute: 0 10*3/uL (ref 0.0–0.7)
Eosinophils Relative: 0 % (ref 0–5)
Lymphocytes Relative: 19 % (ref 12–46)
Lymphs Abs: 2.7 10*3/uL (ref 0.7–4.0)
Monocytes Absolute: 0.7 10*3/uL (ref 0.1–1.0)
Monocytes Relative: 5 % (ref 3–12)
Neutro Abs: 10.8 10*3/uL — ABNORMAL HIGH (ref 1.7–7.7)
Neutrophils Relative %: 76 % (ref 43–77)

## 2011-01-19 ENCOUNTER — Encounter: Payer: Self-pay | Admitting: Internal Medicine

## 2011-01-19 ENCOUNTER — Encounter: Payer: Self-pay | Admitting: Family Medicine

## 2011-01-20 ENCOUNTER — Encounter: Payer: Self-pay | Admitting: Neurology

## 2011-02-24 ENCOUNTER — Other Ambulatory Visit (HOSPITAL_COMMUNITY): Payer: Self-pay | Admitting: Pulmonary Disease

## 2011-02-24 DIAGNOSIS — M25511 Pain in right shoulder: Secondary | ICD-10-CM

## 2011-02-25 ENCOUNTER — Other Ambulatory Visit (HOSPITAL_COMMUNITY): Payer: Self-pay | Admitting: Pulmonary Disease

## 2011-02-25 ENCOUNTER — Ambulatory Visit (HOSPITAL_COMMUNITY)
Admission: RE | Admit: 2011-02-25 | Discharge: 2011-02-25 | Disposition: A | Discharge status: Home / Self Care | Payer: Medicare Other | Source: Ambulatory Visit | Attending: Pulmonary Disease | Admitting: Pulmonary Disease

## 2011-02-25 DIAGNOSIS — M25511 Pain in right shoulder: Secondary | ICD-10-CM

## 2011-02-25 DIAGNOSIS — M67919 Unspecified disorder of synovium and tendon, unspecified shoulder: Secondary | ICD-10-CM | POA: Insufficient documentation

## 2011-02-25 DIAGNOSIS — M719 Bursopathy, unspecified: Secondary | ICD-10-CM | POA: Insufficient documentation

## 2011-02-25 DIAGNOSIS — M19019 Primary osteoarthritis, unspecified shoulder: Secondary | ICD-10-CM | POA: Insufficient documentation

## 2011-03-01 ENCOUNTER — Emergency Department (HOSPITAL_COMMUNITY)
Admission: EM | Admit: 2011-03-01 | Discharge: 2011-03-01 | Disposition: A | Discharge status: Home / Self Care | Payer: Medicare Other | Attending: Emergency Medicine | Admitting: Emergency Medicine

## 2011-03-01 DIAGNOSIS — G2581 Restless legs syndrome: Secondary | ICD-10-CM | POA: Insufficient documentation

## 2011-03-01 DIAGNOSIS — F329 Major depressive disorder, single episode, unspecified: Secondary | ICD-10-CM | POA: Insufficient documentation

## 2011-03-01 DIAGNOSIS — J4489 Other specified chronic obstructive pulmonary disease: Secondary | ICD-10-CM | POA: Insufficient documentation

## 2011-03-01 DIAGNOSIS — J449 Chronic obstructive pulmonary disease, unspecified: Secondary | ICD-10-CM | POA: Insufficient documentation

## 2011-03-01 DIAGNOSIS — M25519 Pain in unspecified shoulder: Secondary | ICD-10-CM | POA: Insufficient documentation

## 2011-03-01 DIAGNOSIS — R05 Cough: Secondary | ICD-10-CM | POA: Insufficient documentation

## 2011-03-01 DIAGNOSIS — R059 Cough, unspecified: Secondary | ICD-10-CM | POA: Insufficient documentation

## 2011-03-01 DIAGNOSIS — F3289 Other specified depressive episodes: Secondary | ICD-10-CM | POA: Insufficient documentation

## 2011-03-01 DIAGNOSIS — M069 Rheumatoid arthritis, unspecified: Secondary | ICD-10-CM | POA: Insufficient documentation

## 2011-03-01 DIAGNOSIS — IMO0001 Reserved for inherently not codable concepts without codable children: Secondary | ICD-10-CM | POA: Insufficient documentation

## 2011-03-01 DIAGNOSIS — F4310 Post-traumatic stress disorder, unspecified: Secondary | ICD-10-CM | POA: Insufficient documentation

## 2011-03-10 LAB — COMPREHENSIVE METABOLIC PANEL
ALT: 18 U/L (ref 0–35)
AST: 19 U/L (ref 0–37)
CO2: 25 mEq/L (ref 19–32)
Calcium: 8.6 mg/dL (ref 8.4–10.5)
GFR calc Af Amer: >60 mL/min (ref >60)
GFR calc non Af Amer: 58 mL/min — ABNORMAL LOW (ref >60)
Potassium: 4.2 mEq/L (ref 3.5–5.1)
Sodium: 137 mEq/L (ref 135–145)
Total Protein: 6.4 g/dL (ref 6.0–8.3)

## 2011-03-10 LAB — URINALYSIS, ROUTINE W REFLEX MICROSCOPIC
Bilirubin Urine: NEGATIVE
Glucose, UA: NEGATIVE mg/dL
Ketones, ur: NEGATIVE mg/dL
Protein, ur: NEGATIVE mg/dL

## 2011-03-10 LAB — URINE MICROSCOPIC-ADD ON

## 2011-03-10 LAB — DIFFERENTIAL
Eosinophils Absolute: 0.5 10*3/uL (ref 0.0–0.7)
Eosinophils Relative: 6 % — ABNORMAL HIGH (ref 0–5)
Lymphs Abs: 2 10*3/uL (ref 0.7–4.0)
Monocytes Relative: 6 % (ref 3–12)

## 2011-03-10 LAB — CBC
Hemoglobin: 13.1 g/dL (ref 12.0–15.0)
MCHC: 36.2 g/dL — ABNORMAL HIGH (ref 30.0–36.0)
WBC: 8.5 10*3/uL (ref 4.0–10.5)

## 2011-03-13 LAB — POCT I-STAT, CHEM 8
Chloride: 108 mEq/L (ref 96–112)
Glucose, Bld: 101 mg/dL — ABNORMAL HIGH (ref 70–99)
HCT: 38 % (ref 36.0–46.0)
Potassium: 3.8 mEq/L (ref 3.5–5.1)

## 2011-03-13 LAB — CBC
Hemoglobin: 12.4 g/dL (ref 12.0–15.0)
MCHC: 35 g/dL (ref 30.0–36.0)
RBC: 3.91 MIL/uL (ref 3.87–5.11)

## 2011-03-13 LAB — DIFFERENTIAL
Basophils Relative: 0 % (ref 0–1)
Lymphs Abs: 1.6 10*3/uL (ref 0.7–4.0)
Monocytes Relative: 3 % (ref 3–12)
Neutro Abs: 11.5 10*3/uL — ABNORMAL HIGH (ref 1.7–7.7)
Neutrophils Relative %: 85 % — ABNORMAL HIGH (ref 43–77)

## 2011-03-15 LAB — URINALYSIS, ROUTINE W REFLEX MICROSCOPIC
Bilirubin Urine: NEGATIVE
Leukocytes, UA: NEGATIVE
Nitrite: NEGATIVE
Specific Gravity, Urine: 1.02 (ref 1.005–1.030)
pH: 5.5 (ref 5.0–8.0)

## 2011-03-15 LAB — URINE MICROSCOPIC-ADD ON

## 2011-03-19 LAB — URINE MICROSCOPIC-ADD ON

## 2011-03-19 LAB — DIFFERENTIAL
Eosinophils Relative: 4 % (ref 0–5)
Lymphocytes Relative: 33 % (ref 12–46)
Lymphs Abs: 2.9 10*3/uL (ref 0.7–4.0)
Monocytes Absolute: 0.5 10*3/uL (ref 0.1–1.0)
Monocytes Relative: 6 % (ref 3–12)

## 2011-03-19 LAB — URINALYSIS, ROUTINE W REFLEX MICROSCOPIC
Glucose, UA: NEGATIVE mg/dL
Ketones, ur: NEGATIVE mg/dL
Leukocytes, UA: NEGATIVE
pH: 5.5 (ref 5.0–8.0)

## 2011-03-19 LAB — BASIC METABOLIC PANEL
Chloride: 105 mEq/L (ref 96–112)
GFR calc Af Amer: >60 mL/min (ref >60)
GFR calc non Af Amer: 51 mL/min — ABNORMAL LOW (ref >60)
Potassium: 3.4 mEq/L — ABNORMAL LOW (ref 3.5–5.1)
Sodium: 141 mEq/L (ref 135–145)

## 2011-03-19 LAB — CBC
HCT: 38.2 % (ref 36.0–46.0)
Hemoglobin: 13 g/dL (ref 12.0–15.0)
MCV: 92.9 fL (ref 78.0–100.0)
Platelets: 290 10*3/uL (ref 150–400)
RBC: 4.11 MIL/uL (ref 3.87–5.11)
WBC: 8.9 10*3/uL (ref 4.0–10.5)

## 2011-03-19 LAB — PREGNANCY, URINE: Preg Test, Ur: NEGATIVE

## 2011-04-02 LAB — CBC
HCT: 33.6 % — ABNORMAL LOW (ref 36.0–46.0)
HCT: 37.5 % (ref 36.0–46.0)
Hemoglobin: 11.7 g/dL — ABNORMAL LOW (ref 12.0–15.0)
Hemoglobin: 12.6 g/dL (ref 12.0–15.0)
MCHC: 34.9 g/dL (ref 30.0–36.0)
MCHC: 35.3 g/dL (ref 30.0–36.0)
MCV: 93.2 fL (ref 78.0–100.0)
MCV: 93.3 fL (ref 78.0–100.0)
Platelets: 246 10*3/uL (ref 150–400)
Platelets: 282 10*3/uL (ref 150–400)
RBC: 3.59 MIL/uL — ABNORMAL LOW (ref 3.87–5.11)
RBC: 3.81 MIL/uL — ABNORMAL LOW (ref 3.87–5.11)
RBC: 4.05 MIL/uL (ref 3.87–5.11)
RDW: 12.3 % (ref 11.5–15.5)
WBC: 6.7 10*3/uL (ref 4.0–10.5)
WBC: 7.6 10*3/uL (ref 4.0–10.5)
WBC: 8.7 10*3/uL (ref 4.0–10.5)

## 2011-04-02 LAB — POCT CARDIAC MARKERS
Myoglobin, poc: 75.1 ng/mL (ref 12–200)
Troponin i, poc: <0.05 ng/mL (ref 0.00–0.09)

## 2011-04-02 LAB — PROTIME-INR
INR: 0.9 (ref 0.00–1.49)
Prothrombin Time: 12.1 seconds (ref 11.6–15.2)

## 2011-04-02 LAB — BASIC METABOLIC PANEL
BUN: 9 mg/dL (ref 6–23)
Chloride: 105 mEq/L (ref 96–112)
Creatinine, Ser: 0.85 mg/dL (ref 0.4–1.2)
GFR calc Af Amer: >60 mL/min (ref >60)
GFR calc non Af Amer: >60 mL/min (ref >60)
Potassium: 3.8 mEq/L (ref 3.5–5.1)
Sodium: 140 mEq/L (ref 135–145)

## 2011-04-02 LAB — TROPONIN I: Troponin I: 0.01 ng/mL (ref 0.00–0.06)

## 2011-04-02 LAB — POCT I-STAT, CHEM 8
BUN: 12 mg/dL (ref 6–23)
Chloride: 103 mEq/L (ref 96–112)
Sodium: 138 mEq/L (ref 135–145)

## 2011-04-02 LAB — CARDIAC PANEL(CRET KIN+CKTOT+MB+TROPI)
CK, MB: 1 ng/mL (ref 0.3–4.0)
Relative Index: 0.9 (ref 0.0–2.5)
Total CK: 123 U/L (ref 7–177)
Troponin I: 0.01 ng/mL (ref 0.00–0.06)

## 2011-04-02 LAB — D-DIMER, QUANTITATIVE: D-Dimer, Quant: 0.28 ug/mL-FEU (ref 0.00–0.48)

## 2011-04-02 LAB — HEPARIN LEVEL (UNFRACTIONATED)
Heparin Unfractionated: 0.59 IU/mL (ref 0.30–0.70)
Heparin Unfractionated: 0.64 IU/mL (ref 0.30–0.70)

## 2011-04-02 LAB — HEPATIC FUNCTION PANEL
ALT: 16 U/L (ref 0–35)
Total Protein: 5.7 g/dL — ABNORMAL LOW (ref 6.0–8.3)

## 2011-04-02 LAB — LIPID PANEL: Cholesterol: 229 mg/dL — ABNORMAL HIGH (ref 0–200)

## 2011-04-02 LAB — APTT: aPTT: 31 seconds (ref 24–37)

## 2011-04-03 LAB — CBC
MCHC: 34.7 g/dL (ref 30.0–36.0)
Platelets: 314 10*3/uL (ref 150–400)
RDW: 12.5 % (ref 11.5–15.5)

## 2011-04-03 LAB — COMPREHENSIVE METABOLIC PANEL
ALT: 24 U/L (ref 0–35)
AST: 21 U/L (ref 0–37)
Albumin: 4.1 g/dL (ref 3.5–5.2)
Alkaline Phosphatase: 63 U/L (ref 39–117)
Calcium: 9.2 mg/dL (ref 8.4–10.5)
GFR calc Af Amer: >60 mL/min (ref >60)
Potassium: 4 mEq/L (ref 3.5–5.1)
Sodium: 140 mEq/L (ref 135–145)
Total Protein: 6.7 g/dL (ref 6.0–8.3)

## 2011-04-03 LAB — POCT CARDIAC MARKERS: Troponin i, poc: <0.05 ng/mL (ref 0.00–0.09)

## 2011-04-03 LAB — DIFFERENTIAL
Eosinophils Absolute: 0.3 10*3/uL (ref 0.0–0.7)
Lymphs Abs: 3.2 10*3/uL (ref 0.7–4.0)
Monocytes Absolute: 0.5 10*3/uL (ref 0.1–1.0)
Monocytes Relative: 5 % (ref 3–12)
Neutrophils Relative %: 63 % (ref 43–77)

## 2011-04-05 ENCOUNTER — Emergency Department (HOSPITAL_COMMUNITY): Payer: Medicare Other

## 2011-04-05 ENCOUNTER — Emergency Department (HOSPITAL_COMMUNITY)
Admission: EM | Admit: 2011-04-05 | Discharge: 2011-04-06 | Disposition: A | Discharge status: Home / Self Care | Payer: Medicare Other | Attending: Emergency Medicine | Admitting: Emergency Medicine

## 2011-04-05 DIAGNOSIS — M069 Rheumatoid arthritis, unspecified: Secondary | ICD-10-CM | POA: Insufficient documentation

## 2011-04-05 DIAGNOSIS — M1990 Unspecified osteoarthritis, unspecified site: Secondary | ICD-10-CM | POA: Insufficient documentation

## 2011-04-05 DIAGNOSIS — J449 Chronic obstructive pulmonary disease, unspecified: Secondary | ICD-10-CM | POA: Insufficient documentation

## 2011-04-05 DIAGNOSIS — IMO0001 Reserved for inherently not codable concepts without codable children: Secondary | ICD-10-CM | POA: Insufficient documentation

## 2011-04-05 DIAGNOSIS — R109 Unspecified abdominal pain: Secondary | ICD-10-CM | POA: Insufficient documentation

## 2011-04-05 DIAGNOSIS — Z79899 Other long term (current) drug therapy: Secondary | ICD-10-CM | POA: Insufficient documentation

## 2011-04-05 DIAGNOSIS — F3289 Other specified depressive episodes: Secondary | ICD-10-CM | POA: Insufficient documentation

## 2011-04-05 DIAGNOSIS — R112 Nausea with vomiting, unspecified: Secondary | ICD-10-CM | POA: Insufficient documentation

## 2011-04-05 DIAGNOSIS — R10814 Left lower quadrant abdominal tenderness: Secondary | ICD-10-CM | POA: Insufficient documentation

## 2011-04-05 DIAGNOSIS — F329 Major depressive disorder, single episode, unspecified: Secondary | ICD-10-CM | POA: Insufficient documentation

## 2011-04-05 DIAGNOSIS — J4489 Other specified chronic obstructive pulmonary disease: Secondary | ICD-10-CM | POA: Insufficient documentation

## 2011-04-05 LAB — URINALYSIS, ROUTINE W REFLEX MICROSCOPIC
Ketones, ur: NEGATIVE mg/dL
Nitrite: NEGATIVE
Nitrite: NEGATIVE
Protein, ur: NEGATIVE mg/dL
Specific Gravity, Urine: >1.03 — ABNORMAL HIGH (ref 1.005–1.030)
Urobilinogen, UA: 0.2 mg/dL (ref 0.0–1.0)
pH: 5.5 (ref 5.0–8.0)

## 2011-04-05 LAB — DIFFERENTIAL
Basophils Absolute: 0 10*3/uL (ref 0.0–0.1)
Lymphocytes Relative: 36 % (ref 12–46)
Neutro Abs: 4.1 10*3/uL (ref 1.7–7.7)
Neutrophils Relative %: 54 % (ref 43–77)

## 2011-04-05 LAB — COMPREHENSIVE METABOLIC PANEL
CO2: 24 mEq/L (ref 19–32)
Calcium: 8.5 mg/dL (ref 8.4–10.5)
Creatinine, Ser: 1.02 mg/dL (ref 0.4–1.2)
GFR calc non Af Amer: 58 mL/min — ABNORMAL LOW (ref >60)
Glucose, Bld: 89 mg/dL (ref 70–99)

## 2011-04-05 LAB — CBC
HCT: 39.5 % (ref 36.0–46.0)
Hemoglobin: 13.9 g/dL (ref 12.0–15.0)
RDW: 12.8 % (ref 11.5–15.5)
WBC: 7.6 10*3/uL (ref 4.0–10.5)

## 2011-04-05 LAB — LIPASE, BLOOD: Lipase: 20 U/L (ref 11–59)

## 2011-04-05 MED ORDER — IOHEXOL 300 MG/ML  SOLN
100.0000 mL | Freq: Once | INTRAMUSCULAR | Status: AC | PRN
  Administered 2011-04-05: 100 mL via INTRAVENOUS

## 2011-04-08 LAB — URINE MICROSCOPIC-ADD ON

## 2011-04-08 LAB — COMPREHENSIVE METABOLIC PANEL
ALT: 16 U/L (ref 0–35)
AST: 16 U/L (ref 0–37)
Albumin: 3.6 g/dL (ref 3.5–5.2)
CO2: 29 mEq/L (ref 19–32)
Calcium: 8.8 mg/dL (ref 8.4–10.5)
GFR calc Af Amer: >60 mL/min (ref >60)
Sodium: 139 mEq/L (ref 135–145)
Total Protein: 5.5 g/dL — ABNORMAL LOW (ref 6.0–8.3)

## 2011-04-08 LAB — DIFFERENTIAL
Eosinophils Absolute: 0.2 10*3/uL (ref 0.0–0.7)
Eosinophils Relative: 3 % (ref 0–5)
Lymphs Abs: 2.8 10*3/uL (ref 0.7–4.0)
Monocytes Absolute: 0.4 10*3/uL (ref 0.1–1.0)
Monocytes Relative: 5 % (ref 3–12)

## 2011-04-08 LAB — CBC
MCHC: 36 g/dL (ref 30.0–36.0)
Platelets: 264 10*3/uL (ref 150–400)
RBC: 3.79 MIL/uL — ABNORMAL LOW (ref 3.87–5.11)
RDW: 12.6 % (ref 11.5–15.5)

## 2011-04-08 LAB — URINALYSIS, ROUTINE W REFLEX MICROSCOPIC
Specific Gravity, Urine: >1.03 — ABNORMAL HIGH (ref 1.005–1.030)
Urobilinogen, UA: 0.2 mg/dL (ref 0.0–1.0)

## 2011-04-09 LAB — CBC
HCT: 40.7 % (ref 36.0–46.0)
Hemoglobin: 14.1 g/dL (ref 12.0–15.0)
MCHC: 34.8 g/dL (ref 30.0–36.0)
MCV: 91.6 fL (ref 78.0–100.0)
Platelets: 300 K/uL (ref 150–400)
RBC: 4.44 MIL/uL (ref 3.87–5.11)
RDW: 13.1 % (ref 11.5–15.5)
WBC: 7.3 K/uL (ref 4.0–10.5)

## 2011-04-09 LAB — COMPREHENSIVE METABOLIC PANEL WITH GFR
ALT: 19 U/L (ref 0–35)
AST: 16 U/L (ref 0–37)
Albumin: 4 g/dL (ref 3.5–5.2)
Alkaline Phosphatase: 78 U/L (ref 39–117)
BUN: 10 mg/dL (ref 6–23)
CO2: 28 meq/L (ref 19–32)
Calcium: 9.4 mg/dL (ref 8.4–10.5)
Chloride: 103 meq/L (ref 96–112)
Creatinine, Ser: 0.66 mg/dL (ref 0.4–1.2)
GFR calc non Af Amer: >60 mL/min
Glucose, Bld: 118 mg/dL — ABNORMAL HIGH (ref 70–99)
Potassium: 3.5 meq/L (ref 3.5–5.1)
Sodium: 139 meq/L (ref 135–145)
Total Bilirubin: 0.3 mg/dL (ref 0.3–1.2)
Total Protein: 6.7 g/dL (ref 6.0–8.3)

## 2011-04-09 LAB — URINALYSIS, ROUTINE W REFLEX MICROSCOPIC
Glucose, UA: NEGATIVE mg/dL
Hgb urine dipstick: NEGATIVE
Ketones, ur: NEGATIVE mg/dL
Nitrite: NEGATIVE
Protein, ur: NEGATIVE mg/dL
Specific Gravity, Urine: >1.03 — ABNORMAL HIGH (ref 1.005–1.030)
Urobilinogen, UA: 0.2 mg/dL (ref 0.0–1.0)
pH: 5 (ref 5.0–8.0)

## 2011-04-09 LAB — DIFFERENTIAL
Basophils Relative: 1 % (ref 0–1)
Eosinophils Absolute: 0.1 10*3/uL (ref 0.0–0.7)
Neutrophils Relative %: 53 % (ref 43–77)

## 2011-04-09 LAB — RAPID URINE DRUG SCREEN, HOSP PERFORMED
Amphetamines: NOT DETECTED
Barbiturates: NOT DETECTED
Benzodiazepines: POSITIVE — AB
Cocaine: NOT DETECTED
Opiates: POSITIVE — AB
Tetrahydrocannabinol: NOT DETECTED

## 2011-04-10 LAB — DIFFERENTIAL
Basophils Relative: 0 % (ref 0–1)
Eosinophils Absolute: 0.1 10*3/uL (ref 0.0–0.7)
Eosinophils Relative: 2 % (ref 0–5)
Lymphs Abs: 2.3 10*3/uL (ref 0.7–4.0)

## 2011-04-10 LAB — BASIC METABOLIC PANEL
BUN: 7 mg/dL (ref 6–23)
CO2: 25 mEq/L (ref 19–32)
Chloride: 106 mEq/L (ref 96–112)
Glucose, Bld: 117 mg/dL — ABNORMAL HIGH (ref 70–99)
Potassium: 3.8 mEq/L (ref 3.5–5.1)

## 2011-04-10 LAB — CBC
HCT: 36.6 % (ref 36.0–46.0)
MCHC: 32.2 g/dL (ref 30.0–36.0)
MCV: 83.4 fL (ref 78.0–100.0)
Platelets: 255 10*3/uL (ref 150–400)
RDW: 13.9 % (ref 11.5–15.5)

## 2011-05-13 NOTE — Discharge Summary (Signed)
NAMESEQUOYAH, [name] NO.:  [id number]   MEDICAL RECORD NO.:  [id number]          PATIENT TYPE:  INP   LOCATION:  A217                          FACILITY:  [facility]   PHYSICIAN:  Skeet Latch, DO    DATE OF BIRTH:  1961/04/22   DATE OF ADMISSION:  05/31/2007  DATE OF DISCHARGE:  06/05/2008LH                               DISCHARGE SUMMARY   DISCHARGE DIAGNOSES:  1. Chest pain  2. Anxiety.  3. History of depression.  4. Fibromyalgia.   BRIEF HOSPITAL COURSE:  This is a 50 year old Caucasian female who  presented to the ER with chest pain.  Chest pain is of several months'  duration, but had been worsening recently.  The patient reported the  pain to be 10/10 at its worst, substernal and pressure-like.  The  patient states the pressure waxes and wanes.  The patient states that  the pain started back in November, both not seen by a cardiologist, and  she had multiple admissions since her mother passed away with behavioral  health due to panic attacks and anxiety.  The patient was seen in the  emergency room and placed on IV nitroglycerin, which helped relieve some  of her pain.  The patient was complaining of some nausea and vomiting on  admission.  In the emergency room, she was found to have a normal EKG,  normal chest x-ray, normal cardiac markers.  The patient was still  admitted for evaluation.  She was admitted with a Cardiology consult to  rule out myocardial infarction.  The patient was placed on IV pain  medication, continued on IV nitroglycerin and placed on DVT as well as  GI prophylaxis.  The patient did have a stress Myoview which was  negative.  The patient also had a chest x-ray which also was negative.  The patient's IV medications were discontinued and the patient's pain  also has improved, patient now ready for discharge.   VITALS ON DISCHARGE:  Temperature is 97.5, pulse 58, respirations 18 and  blood pressure 84/52.   LABORATORY DATA:  No  labs on day of discharge.   DISCHARGE MEDICATIONS:  The patient was sent home on the following  medications:  1. Requip 2 mg twice a day.  2. Lyrica 75 mg twice a day.  3. Prozac 60 mg daily.  4. Xanax 1 mg twice a day.  5. Trazodone 100 mg at night.  6. Hydrochlorothiazide 25 mg daily.  7. Fentanyl 75 mcg q.72 h.  8. Oxycodone 15 mg every 4 hours p.r.n. if needed; the patient was      given a prescription for oxycodone 15 mg one p.o. q.4 h. p.r.n.,      #20.  9. Albuterol inhaler as needed.  10.Zantac over the counter 150 mg daily.   DIET:  She is to maintain a low-salt heart-healthy diet.   ACTIVITY:  Increase activity slowly.   FOLLOWUP:  The patient is to follow up with Dr. Janna Arch on June 09, 2007.   DISCHARGE INSTRUCTIONS:  The patient is to follow up with the above  physician  on June 09, 2007.  The patient is told to return to the  emergency room or call primary care physician if chest pain returns, or  call 911.  I feel the patient understood these instructions.      Skeet Latch, DO  Electronically Signed     SM/MEDQ  D:  06/03/2007  T:  06/04/2007  Job:  [id number]   cc:   Melvyn Novas, MD  Fax: [phone]
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
## 2011-05-13 NOTE — H&P (Signed)
[name], [name]               ACCOUNT NO.:  [id number]   MEDICAL RECORD NO.:  [id number]          PATIENT TYPE:  INP   LOCATION:  A217                          FACILITY:  [facility]   PHYSICIAN:  Margaretmary Dys, M.D.DATE OF BIRTH:  12-May-1961   DATE OF ADMISSION:  05/31/2007  DATE OF DISCHARGE:  LH                              HISTORY & PHYSICAL   INCOMPLETE   ADMISSION DIAGNOSES:  1. Chest pain, rule out myocardial infarction.  2. Chronic pain syndrome.  3. History of severe depression with anxiety disorder.   CHIEF COMPLAINT:  Chest pain of several days' duration, but initially  started back in November 2007.   HISTORY OF PRESENT ILLNESS:  [name] is a 50 year old Caucasian  female who presented to the emergency room with left-sided retrosternal  chest pain.  She describes this as a 10/10.      Margaretmary Dys, M.D.  Electronically Signed     AM/MEDQ  D:  06/01/2007  T:  06/01/2007  Job:  [id number]
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
## 2011-05-13 NOTE — Consult Note (Signed)
NAMEBRITTNAE, [name]:  [id number]   MEDICAL RECORD NO.:  [id number]          PATIENT TYPE:  INP   LOCATION:  A217                          FACILITY:  [facility]   PHYSICIAN:  Gerrit Friends. Dietrich Pates, MD, FACCDATE OF BIRTH:  1961-07-03   DATE OF CONSULTATION:  06/01/2007  DATE OF DISCHARGE:                                 CONSULTATION   HISTORY OF PRESENT ILLNESS:  A 50 year old woman with multiple medical  problems but no known cardiovascular disease, presents with chest pain.  [name] describes some chest discomfort over the past several months  that was fairly modest.  This is precordial, present intermittently and  described as pressure.  There has been radiation to the left arm and  neck.  It typically occurs with exertion and is relieved with rest.  There is associated dyspnea, diaphoresis and nausea.  She presented to  the emergency department due to increase in severity of these symptoms  and an episode of emesis.  She described a plethora of other issues  including anxiety, anorexia, depression, diarrhea, leg swelling and  weight loss.  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   There is a history of cigarette smoking with consumption of  approximately 20 pack-years.  She has been told that her cholesterol is  okay in the past.  She has not had hypertension nor diabetes.   PAST MEDICAL HISTORY:  Past medical history is otherwise notable for a  history of fibromyalgia, interstitial cystitis with multiple urologic  procedures, irritable bowel syndrome, asthma, bipolar illness,  migraines, rheumatoid arthritis and sleep apnea.  She was admitted to  [facility] earlier this year with pneumonia that responded well  to medical therapy.  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   ALLERGIES:  THE PATIENT REPORTS ALLERGIES  TO PHENOTHIAZINES, BIAXIN AND  REGLAN.   MEDICATIONS:  Recent medications include:  1. Hydrochlorothiazide 25 mg daily.  2. Lyrica 75 mg b.i.d.  3. Oxycodone 50 mg every 4 hours p.r.n.  4. Requip 2 mg b.i.d.  5. Trazodone 100 mg daily.  6. Xanax 1 mg b.i.d.  7. Prozac 50 mg daily.  8. Zantac 150 milligrams daily.   SOCIAL HISTORY:  Divorced; current tobacco consumption is one pack per  day; no history of excessive alcohol use or illicit drugs.   FAMILY HISTORY:  Mother died at age 23 due to myocardial infarction.   REVIEW OF SYSTEMS:  The review of systems is notable for multiple  somatic complaints as noted above.   PHYSICAL EXAMINATION:  GENERAL:  On exam, a pleasant well-nourished  appearing woman in no acute distress.  VITAL SIGNS:  The heart rate is 70 and regular, blood pressure 100/60,  temperature 98, weight 68 kg, respirations 18.  HEENT:  Anicteric sclerae; EOMs full; normal lids and conjunctivae;  moist oral mucosa.  NECK:  No jugular venous distension; normal carotid upstrokes without  bruits.  ENDOCRINE:  No thyromegaly.  HEMATOPOIETIC:  No adenopathy.  CARDIAC:  Normal first and second heart sounds; minimal systolic  ejection murmur.  LUNGS:  Clear.  ABDOMEN:  Soft and nontender; no organomegaly; normal bowel sounds.  EXTREMITIES:  No edema; normal distal pulses.  NEUROMUSCULAR:  Symmetric strength and tone; normal cranial nerves.  SKIN:  No significant lesions.   LABORATORY DATA:  Chest x-ray shows no acute abnormalities.  Cardiac  markers are negative.  Metabolic profile is normal.  CBC shows a minimal  anemia with hemoglobin of 12 and a normal MCV.  EKG:  Normal sinus  rhythm; within normal limits.   IMPRESSION:  [name] is at low risk for coronary disease.  Her  symptoms are very impressive, but she has negative cardiac markers, an  entirely normal EKG and an unremarkable examination.  She notes relief  of discomfort with intravenous nitroglycerin  and recurrence when that  medication is discontinued.  Obviously this will not be a long-term  solution but can be continued for now.  We will proceed with a stress  nuclear study to exclude significant coronary disease.  Assuming that  study is negative, treatment with p.r.n. sublingual nitroglycerin will  probably be appropriate.      Gerrit Friends. Dietrich Pates, MD, [facility]  Electronically Signed     RMR/MEDQ  D:  06/01/2007  T:  06/02/2007  Job:  [id number]

## 2011-05-16 NOTE — Discharge Summary (Signed)
. [facility]  Patient:    [name], [name] Visit Number: [id number] MRN: [id number]          Service Type: EMS Location: ED Attending Physician:  Herbert Seta Admit Date:  01/14/2002 Discharge Date: 01/14/2002                             Discharge Summary  HISTORY OF PRESENT ILLNESS:  [name] is a 50 year old married Caucasian female with a history of recurring depression.  She was doing well until about two weeks ago when she began to decompensate.  She had been feeling very depressed, hopeless, helpless, worthless.  She began to have crying spells and could not control her emotions.  She could not sleep well at night.  Her appetite had decreased.  She had become suicidal to the point that she had put a gun under the pillow.  She came to see me on an emergency basis on May 15, 1997, and voiced suicidal ideations.  Hence the patient was hospitalized.  ADMISSION DIAGNOSES:   AXIS I:  Adjustment disorder with depressed mood,            rule out major depressive disorder, recurrent episode,            severe, without psychotic features, rule out atypical            bipolar disorder.  AXIS II:  Deferred. AXIS III:  Interstitial cystitis, history of multiple abdominal]            surgeries for interstitial cystitis, cholecystectomy,            tonsillectomy, hysterectomy and irritable bowel            syndrome.  AXIS IV:  Marital discord.   AXIS V:  Global assessment of functioning 30.  HOSPITAL COURSE:  The patient was hospitalized.  She was resumed on her Prozac which is 10 mg p.o. q.a.m.  Ambien 10 mg p.o. q.h.s. was prescribed for insomnia.  Actually she was resumed on Prozac 20 mg.  It was decreased to 10 mg q.a.m.  The patient began to improve.  GI consultation was obtained for her constant diarrhea.  The patient had appropriate tests by Dr. Loreta Ave.  She began to improve.  Her drug screen was positive for benzodiazepines.  Thyroid  functions were within normal limits.  UA was negative.  Liver functions were essentially clinically within normal limits.  The patient improved.  She was able to contract for safety.  Hence she was discharged home after she had her colonoscopy by Dr. Loreta Ave. She was discharged on Prozac 20 mg q.a.m, Xanax 0.5 t.i.d, Ambien 10 q.h.s.  She was provided with outpatient follow up.  DISCHARGE DIAGNOSES:   AXIS I:  Adjustment disorder with depressed, rule out major            depressive disorder, current episode severe, without            psychotic features.  AXIS II:  Deferred. AXIS III:  Interstitial cystitis.  History of multiple abdominal            surgeries, cholecystitis, tonsillectomy, hysterectomy            and irritable bowel syndrome.  AXIS IV:  Marital discord.   AXIS V:  Global assessment of functioning 60. Attending Physician:  Herbert Seta DD:  06/20/97 TD:  06/21/97 Job: [id number]

## 2011-05-16 NOTE — Op Note (Signed)
NAMEBEVERELY, SUEN               ACCOUNT NO.:  [id number]   MEDICAL RECORD NO.:  [id number]          PATIENT TYPE:  AMB   LOCATION:  SDC                           FACILITY:  [facility]   PHYSICIAN:  Guy Sandifer. Tomblin II, M.D.DATE OF BIRTH:  January 19, 1961   DATE OF PROCEDURE:  02/27/2005  DATE OF DISCHARGE:                                 OPERATIVE REPORT   PREOPERATIVE DIAGNOSIS:  Pelvic pain.   POSTOPERATIVE DIAGNOSIS:  Right ovarian cyst.   PROCEDURE:  Open laparoscopy with right salpingo-oophorectomy.   SURGEON:  Guy Sandifer. Henderson Cloud, M.D.   ANESTHESIA:  General with endotracheal intubation.   ESTIMATED BLOOD LOSS:  Less than 30 mL.   SPECIMENS:  Right tube and ovary.   INDICATIONS AND CONSENT:  This patient is a 50 year old divorced white  female, G2, P1, status post LVH and LSO previously with known endometriosis,  who has had progressively worsening right lower quadrant pain.  Details are  dictated in the history and physical.  Laparoscopy, possible right salpingo-  oophorectomy is discussed with the patient preoperatively.  Potential risks  and complications are reviewed preoperatively, including but not limited to  infection; bowel, bladder or ureteral damage; bleeding requiring transfusion  of blood products and possible transfusion reaction, HIV and hepatitis  acquisition; DVT, PE, pneumonia, recurrent pain.  Issues of menopause have  been discussed.  All questions are answered, and consent is signed on the  chart.   FINDINGS:  There is an omental adhesion superior to the umbilicus.  The  uterus and left tube and ovary are surgically absent.  The right ovary  contains multiple 2-3 cm smooth cysts, largely replacing the ovary.   PROCEDURE:  The patient is taken to the operating room, where she is  identified, placed in the dorsal supine position, and general anesthesia is  induced by endotracheal intubation.  She is then placed in the dorsal  lithotomy position, where she is  prepped abdominally and vaginally, the  bladder is straight-catheterized.  A sponge on a stick is placed at the  vagina, and she is draped in a sterile fashion.  An infraumbilical incision  is made between two Allis clamps, and the dissection is carried out in  layers to the fascia.  The fascia is then grasped with Kocher clamps to  elevate the abdominal wall.  The fascia is then entered, and the peritoneum  is bluntly entered without difficulty.  Anchoring sutures of 0 Vicryl are  placed at each angle with care being taken not to pick up any underlying  structures.  This is then used to tie down a disposable open laparoscopic  trocar sleeve.  Pneumoperitoneum is created and inspection reveals no damage  to surrounding structures.  A small suprapubic incision is made and a 5 mm  disposable trocar sleeve is placed under direct visualization without  difficulty.  The above findings are noted.  The course of the ureter is  identified and seen to be clear of the area of surgery.  Then using the  Gyrus bipolar cautery cutting instrument, the right infundibulopelvic  ligament followed by  the mesosalpinx is taken down.  The specimen is placed  in the pelvis.  Then using the 5 mm laparoscope and the EndoCatch, the  specimen is delivered through the umbilical incision without difficulty.  Switching back to the operative laparoscope, inspection reveals good  hemostasis under reduced pneumoperitoneum.  Irrigation is carried out, and  fluid is removed.  The suprapubic trocar sleeve is removed.  The umbilical  trocar sleeve is removed and the fascia is reapproximated by elevating the  abdominal wall and tying the angle sutures together.  Both incisions are  then injected with 0.5% plain Marcaine and the umbilical incision is closed  with subcuticular 2-0 Vicryl.  Dermabond is placed on both incisions.  The  sponge stick is removed.  All counts are correct.  The patient is awakened  and taken to the  recovery room in good, stable condition.      JET/MEDQ  D:  02/27/2005  T:  02/27/2005  Job:  [id number]

## 2011-05-16 NOTE — Op Note (Signed)
[facility]  Patient:    [name], [name]                      MRN: [id number] Proc. Date: 01/19/01 Adm. Date:  [id number] Attending:  Evlyn Clines                           Operative Report  PREOPERATIVE DIAGNOSIS:  Left flank pain with history of stone and interstitial cystitis.  POSTOPERATIVE DIAGNOSIS:  Left flank pain with history of stone and interstitial cystitis.  No stone found.  OPERATION: 1. Cystoscopy. 2. Left retrograde pyelogram with interpretation. 3. Hydrodistention of the bladder. 4. Urethral dilation. 5. Installation Clorpactin, Pyridium, and Marcaine.  SURGEON:  Excell Seltzer. Annabell Howells, M.D.  ANESTHESIA:  General.  COMPLICATIONS:  None.  INDICATIONS:  The patient is a 50 year old white female with a history of chronic interstitial cystitis who has had multiple hydrodistentions.  She presents with recurrent bladder symptoms.  She has also had recent left flank pain and history of a stone.  CT and IVP were negative.  It was felt that a retrograde pyelogram would be worthwhile to completely rule out stone during her cystoscopic procedure.  FINDINGS AND PROCEDURE: The patient was taken to the operating room where general anesthetic was induced.  She was given p.o. Tequin preoperatively. She was placed in the lithotomy position.  Her perineum and genitalia were prepped with Betadine solution, and she was draped in the usual sterile fashion.  Cystoscopy was performed using a 22-French scope and and the 30 degree lens. Examination revealed a normal urethra.  The bladder wall was noted to to be without lesion, stones, or inflammation.  The ureteral orifices were in normal anatomic position and efflux of clear urine.   Once cystoscopy had been completed, the left cannulated with a 5-French opening catheter, and contrast was instilled in retrograde fashion.  This study revealed a completely normal ureter and intrarenal collecting  system without hydronephrosis, filling defect, or any evidence of stone.  Drainage views were taken as well.  The system drained rapidly.  After completion of retrograde pyelogram, the cystoscope was removed, and an 18-French Foley catheter was inserted and balloon filled with 10 cc of air. The patients bladder was then filled to under 80 cm of water pressure to capacity.  This was held for 5 minutes, and the bladder was then drained.  She held 600 cc under pressure.  At the end of drainage, there was no blood in the efflux, but upon removal of the catheter, there was some blood in the last few drops of urine.  Reinspection of the bladder revealed a few glomerular hemorrhages consistent with the patients history of interstitial cystitis.  At this point, the Foley catheter was reinserted.  The bladder was filled with 300 cc of Clorpactin solution 1 amp in 1 liter of normal saline.  This was left indwelling for 3 minutes.  The bladder was then drained.  The urethra was calibrated to 36-French with female sounds.  No stenosis was noted.  The bladder was then instilled with 30 cc of 0.25 Marcaine with 100 mg of crushed Pyridium.  She was taken down from lithotomy position.  Her anesthetic was reversed.  She was admitted to the recovery room in stable condition.  There were no complications. DD:  01/19/01 TD:  01/19/01 Job: [id number]

## 2011-05-16 NOTE — H&P (Signed)
NAMETRENIYA, LOBB NO.:  [id number]   MEDICAL RECORD NO.:  [id number]          PATIENT TYPE:  IPS   LOCATION:  0508                          FACILITY:  [facility]   PHYSICIAN:  Anselm Jungling, MD  DATE OF BIRTH:  05-Aug-1961   DATE OF ADMISSION:  12/16/2006  DATE OF DISCHARGE:                       PSYCHIATRIC ADMISSION ASSESSMENT   IDENTIFYING INFORMATION:  The patient is a 50 year old divorced white  female voluntarily admitted on December 17, 2006.   HISTORY OF PRESENT ILLNESS:  The patient presents with a history of  depression, stating that she was not feeling safe.  She was recently  discharged from [facility] approximately two weeks ago  with suicidal ideation.  The patient was having some suicidal thoughts  to stab herself.  The patient states that she has recently stopped one  of her medications due to nausea.  She has also lost about 20 pounds,  sleeping only about three hours a night.  The patient reports her  stressors are her mother's death in 12/03/2006 where she performed  CPR on her and is also having some conflict with her fiance.   PAST PSYCHIATRIC HISTORY:  Second visit to [facility].  Was here approximately two weeks ago for suicidal thoughts.  She is to  see Dr. Lolly Mustache for outpatient mental health services.  Has a therapist  in [place].  In the past has been on Effexor but reports weight  gain, Paxil and Celexa.   SOCIAL HISTORY:  This is a 50 year old divorced white female living with  her fiance and his 7 year old son.  She has completed 1-1/2 years of  college.  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   FAMILY HISTORY:  Father with alcohol.   ALCOHOL/DRUG HISTORY:  The patient smokes.  Denies any alcohol or drug  use.   PRIMARY CARE PHYSICIAN:  Dr. Gerilyn Pilgrim in [place].   MEDICAL PROBLEMS:  Arthritis, fibromyalgia, nodules right lung,  asthma,  interstitial cystitis and degenerative disk disease.   MEDICATIONS:  Has been on oxycodone 50 mg every eight hours p.r.n.,  fentanyl patch 75 mg every two days, Prozac 60 mg q.h.s., Requip 1 mg, 1  in the morning, 1 at bedtime, Vistaril 25 mg every four hours as needed,  Klonopin 1 mg, 1 in the morning, 1 at 3 p.m., 1 at h.s., albuterol  inhaler, Spiriva inhaler 18 mcg daily, Veramyst 27.5 mg daily, Tessalon  Perles, Protonix 40 mg daily and trazodone p.o. q.h.s. p.r.n. for sleep.  Also is currently on a Z-Pak for upper respiratory infection and was  taking Lyrica 150 mg daily.   ALLERGIES:  The patient reports allergy to ALL DECONGESTANTS, PHENERGAN,  TIGAN and TOFRANIL.   REVIEW OF SYSTEMS:  Positive for cough, shortness of breath, followed by  her primary care Amedee Cerrone with a CAT scan showing a nodule in her right  lung.  No nausea or vomiting or diarrhea.  Positive for asthma.  Positive for urinary frequency.  No urgency, dysuria or incontinence.  Positive for visual  disturbances, wears glasses.  No diarrhea and no  headaches.  No seizures.  Positive for depression, positive for suicidal  ideation.   PHYSICAL EXAMINATION:  VITAL SIGNS:  Her temperature is 99, heart rate  73, respirations 18, blood pressure 114/66.  She is 5 feet 1 inch tall,  144 pounds.  GENERAL:  This is a well-developed, well-nourished female in no acute  distress.  NECK:  Negative lymphadenopathy.  No thyromegaly.  CHEST:  clear to auscultation bilaterally.  BREAST:  Exam was deferred.  HEART:  Regular rate and rhythm.  ABDOMEN:  Bowel sounds are positive.  No hepatosplenomegaly.  No  tenderness.  GU:  Exam was deferred.  EXTREMITIES:  2+ positive pulses.  No edema.  No clubbing.  SKIN:  Warm and dry.  NEUROLOGICAL:  Cranial nerves are intact.  Patella reflexes are 3+  brisk.   LABORATORY DATA:  Urine drug screen is negative.  Urinalysis is  negative.   MENTAL STATUS EXAM:  She is fully  alert, cooperative, fair eye contact.  Speech is clear, normal rate and tone.  The patient feels depressed.  The patient appears sad and pleasant.  Thought processes are coherent.  No evidence of psychosis.  Cognitive function intact.  Memory is good.  Judgment is good.  Insight is fair, agreeable to suggestions.   DIAGNOSES:  AXIS I:  Major depressive disorder, severe, recurrent.  AXIS II:  Deferred.  AXIS III:  Arthritis, fibromyalgia, asthma, interstitial cystitis and  degenerative disk disease.  AXIS IV:  Psychosocial problems, medical problems.  AXIS V:  Current 35.   PLAN:  The patient was considered a high fall risk.  We will continue  her medications, reinforce medication compliance.  The patient did  receive benefit of Paxil in the past.  We will discontinue Prozac and  initiate patient on Paxil.  Will have a family session with her fiance  for support.  The patient is to follow up with Dr. Lolly Mustache for outpatient  services.   TENTATIVE LENGTH OF STAY:  Three to five days.      Landry Corporal, N.P.      Anselm Jungling, MD  Electronically Signed    JO/MEDQ  D:  12/18/2006  T:  12/19/2006  Job:  [id number]

## 2011-05-16 NOTE — Assessment & Plan Note (Signed)
[facility]                                   ON-CALL NOTE   NAME:GAINES, RYELEE ALBEE                      MRN:          [id number]  DATE:11/02/2006                            DOB:          05/30/1961    SUBJECTIVE:  [name] called at 11:15 on the evening of November 02, 2006  with a multitude of complaints and concerns.  Apparently, she has been  recently diagnosed with a lung nodule and with a low cortisol level.  She  complains of tachycardia and leg cramps.  Mostly, she seems extremely  anxious and even tearful over the phone.  She does not sound to be in any  distress and she denies any chest pain, pressure, tightness, or heaviness.  She denies shortness of breath.  She is concerned over the numerous symptoms  that she has, which also include numbness and tingling in her hands and  feet.  She questions whether she need to be seen tonight.   PLAN:  I have reassured her that I thought she would be okay tonight and  that she should call Dr. Roxy Cedar office in the morning to arrange  appropriate and timely followup.  The timeliness of her followup needs to  take into account her extreme level of anxiety over her current medical  condition.    ______________________________  Oley Balm Sung Amabile, MD    DBS/MedQ  DD: 11/02/2006  DT: 11/03/2006  Job #: [id number]   cc:   Joni Fears D. Maple Hudson, MD, FCCP, FACP

## 2011-05-16 NOTE — Discharge Summary (Signed)
[name], [name] NO.:  [id number]   MEDICAL RECORD NO.:  [id number]          PATIENT TYPE:  IPS   LOCATION:  0306                          FACILITY:  [facility]   PHYSICIAN:  Anselm Jungling, MD  DATE OF BIRTH:  07-11-1961   DATE OF ADMISSION:  01/07/2007  DATE OF DISCHARGE:  01/13/2007                               DISCHARGE SUMMARY   IDENTIFYING DATA/REASON FOR ADMISSION:  This was a readmission to the  behavioral health service for [name], a 50 year old female, who was well-  known to [place].  She returned due to increasing depression and suicidal  ideation.  She had lost her mother several months prior, and continued  to grieve in a complicated fashion due to her guilt.  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  She came to [place] on a regimen of  Paxil and Klonopin.  Medical issues included fibromyalgia, chronic pain,  asthma, and hypertension.  Please refer to the admission note for  further details pertaining to the symptoms, circumstances and history  that led to her hospitalization.  She was given initial Axis I diagnoses  of major depressive disorder, recurrent without psychotic features, rule  out bipolar depression, and complicated bereavement.   MEDICAL/LABORATORY:  The patient was medically and physically assessed  by the psychiatric nurse practitioner.  As referenced above, she had  various medical problems.  She was continued on a regimen of Duragesic  patch 75 mcg every two days, Lyrica 150 mg twice daily, Spiriva 18 mcg  daily, and Requip 1 mg twice daily.  There were no acute medical issues.   HOSPITAL COURSE:  The patient was admitted to the adult inpatient  psychiatric service.  She presented as a well-nourished, well-developed  woman who was alert and fully oriented.  She was depressed with sad  affect.  She denied suicidal ideation in the initial interview and  throughout the rest of her stay, stating that she felt safer in the  inpatient unit.  There were no signs or symptoms of psychosis or thought  disorder.  She verbalized a strong desire for help.   She was re-involved in milieu therapy, in various therapeutic groups and  activities designed help her acquire better coping skills, a better  understanding of her underlying disorders and dynamics, and the  development of an aftercare plan.  She was continued on a psychotropic  regimen consisting of Paxil 20 mg daily, Klonopin 1 mg t.i.d., and  trazodone 50 mg at bedtime.   She was a good participant in the treatment program, except on those  certain occasions when she complained that she had too much fibromyalgia  pain to get out of bed.   During this hospital stay we were unable to identify anyone with whom to  have a family conference.   The patient gradually felt better over the course of her hospital stay,  and was ready for discharge on the seventh hospital day.   AFTERCARE:  The patient was to follow  up with [name], appointment to be arranged at the time of discharge.   DISCHARGE MEDICATIONS:  Klonopin 1 mg t.i.d., Duragesic patch 75 mcg  every two days, trazodone 50 mg q.h.s., Lyrica 150 mg b.i.d., Spiriva 18  mcg daily, Requip 1 mg b.i.d. and Paxil 20 mg daily.   DISCHARGE DIAGNOSES:  AXIS I: Major depressive disorder, recurrent  without psychotic features, and complicated bereavement.  AXIS II: Deferred.  AXIS III: History of fibromyalgia, hypertension, asthma.  AXIS IV: Stressors severe.  AXIS V: GAF on discharge 66.      Anselm Jungling, MD  Electronically Signed     SPB/MEDQ  D:  01/14/2007  T:  01/14/2007  Job:  [id number]

## 2011-05-16 NOTE — Group Therapy Note (Signed)
[name], [name]               ACCOUNT NO.:  [id number]   MEDICAL RECORD NO.:  [id number]          PATIENT TYPE:  INP   LOCATION:  A309                          FACILITY:  [facility]   PHYSICIAN:  Kofi A. Gerilyn Pilgrim, M.D. DATE OF BIRTH:  [birth year]   DATE OF PROCEDURE:  DATE OF DISCHARGE:                                   PROGRESS NOTE   The patient continues to report her symptoms have remained unabated.  Wanted  to try her on imipramine but she apparently has a long list of drug  allergies, including imipramine, Vioxx, tiagabine, Compazine, Demerol,  morphine sulfate, and Phenergan.  The patient's MRI was reviewed and  essentially is negative.  There are no hyperintensities noted on diffusion  imaging.  The white matter tracks are completely normal on flare imaging.  No blood vessels or abnormalities are noted on T2 sequences.   ASSESSMENT:  A lady with multiple neuropathic symptoms and chronic pain  without any clear unifying diagnosis.  She may have more of a functional  problem.  She has had some nausea on emesis today, but she apparently has  this on a chronic basis. We are going to  increase the dose of Lyrica.  The  dose of the fentanyl patch could also be increased, probably tomorrow.  Blood testing will be done, including vitamin B12 level, homocysteine,  thyroid function tests, and sed rate.      Kofi A. Gerilyn Pilgrim, M.D.  Electronically Signed     KAD/MEDQ  D:  04/16/2006  T:  04/16/2006  Job:  [id number]

## 2011-05-16 NOTE — Op Note (Signed)
[name], [name]                         ACCOUNT NO.:  [id number]   MEDICAL RECORD NO.:  [id number]                   PATIENT TYPE:  AMB   LOCATION:  DAY                                  FACILITY:  [facility]   PHYSICIAN:  Excell Seltzer. Annabell Howells, M.D.                 DATE OF BIRTH:  [birth year]   DATE OF PROCEDURE:  04/25/2004  DATE OF DISCHARGE:                                 OPERATIVE REPORT   PROCEDURE:  Cystoscopy, hydrodistention of the bladder and urethral dilation  and instillation of Pyridium and Marcaine.   PREOPERATIVE DIAGNOSIS:  Interstitial cystitis.   POSTOPERATIVE DIAGNOSIS:  Interstitial cystitis.   SURGEON:  Excell Seltzer. Annabell Howells, M.D.   ANESTHESIA:  General.   COMPLICATIONS:  None.   INDICATIONS FOR PROCEDURE:  The patient is a 50 year old white female with a  long history of interstitial cystitis who has had recurrent symptoms.  She  generally responds to hydrodistention.   DESCRIPTION OF PROCEDURE:  The patient was taken to the operating room.  She  received Cipro preoperatively.  General anesthetic was induced and she was  placed in the lithotomy position. Her perineum and genitalia were prepped  with Betadine solution and she was draped in the usual sterile fashion.  Cystoscopy was performed using a 17 [place] scope and 70 degree lenses.  Examination revealed a normal urethra.  The bladder wall was examined and  was without tumor, stones or inflammation.  The ureteral orifices were  unremarkable.  The cystoscope was removed and an 40 French Foley catheter  was inserted and the balloon was filled with 10 mL of sterile fluid.  The  bladder was then dilated under 80 cm of water pressure capacity. This was  held for five minutes.  The bladder was then drained and her capacity under  anesthesia was 750 mL.  Her terminal efflux is bloody.  Repeat cystoscopy  revealed a few glomerular hemorrhages in the trigone, but was otherwise  unremarkable.  The scope was then removed.  The  urethra was then calibrated  with a 27 [place] with female sounds.  No stenosis was needed.  She then  underwent instillation of 30 mL of 1/4% Marcaine with 400 mg of crushed  Pyridium into the bladder.  She was taken down from the lithotomy position.  Her anesthetic was reversed.  She was moved to the recovery room in stable  condition.  There were no complications.                                               Excell Seltzer. Annabell Howells, M.D.    JJW/MEDQ  D:  04/25/2004  T:  04/25/2004  Job:  [id number]

## 2011-05-16 NOTE — H&P (Signed)
NAMESHANECA, ORNE NO.:  [id number]   MEDICAL RECORD NO.:  [id number]          PATIENT TYPE:  INP   LOCATION:  A309                          FACILITY:  [facility]   PHYSICIAN:  [name], MD  DATE OF BIRTH:  [birth year]   DATE OF ADMISSION:  04/14/2006  DATE OF DISCHARGE:  LH                                HISTORY & PHYSICAL   CHIEF COMPLAINT:  Back pain.   HISTORY OF PRESENT ILLNESS:  The patient has had intractable back pain for  multiple months, minimal responsive recently to large doses of Roxicodone 15  mg q.4h. p.r.n. pain.  She underwent magnetic resonance imaging of her C-  spine in November 2006, MRI of her T-spine November 2006, and MRI of L-spine  on April 13, 2005, all with negative acute process to explain her  intractable pain.  She complains of frequent falling and new-onset of leg  weakness bilaterally.  She has had no incontinence, but complains of bladder  pressure and noted hematuria that has been persistent.   PAST MEDICAL HISTORY:  Fairly unremarkable.  She had a fall that resulted in  hematoma formation in the subacute past with sciatica resulting from that.  This occurred in February 2006.   PAST MEDICAL HISTORY:  Otherwise, noncontributory.   SOCIAL HISTORY:  Noncontributory.   FAMILY HISTORY:  Noncontributory.   REVIEW OF SYSTEMS:  Negative in detail.   PHYSICAL EXAMINATION:  SKIN:  Unremarkable.  HEENT:  Head and neck with no JVD or adenopathy.  NECK:  Supple.  CHEST:  Clear.  CARDIAC:  Regular rate and rhythm without murmurs, rubs or gallops.  ABDOMEN:  Benign with no organomegaly or masses.  EXTREMITIES:  Mild hyperreflexia of the bilateral lower extremities.  Straight legs negative bilaterally.  Motor strength appears adequate and  gait is normal.   ASSESSMENT/PLAN:  1.  Intractable back pain, question etiology with normal anatomy.  It is      possible the magnetic resonance imaging missed a disc and myelography  might be indicated.  2.  Possibility of peripheral neuropathy.  Will have orthopedic      consultation, neurology consultation with appropriate studies ordered as      indicated.  In the meantime, we are going to control her pain with      parenteral pain medicine, i.e. Dilaudid p.r.n. until we can sort through      this.      [name], MD  Electronically Signed     LJF/MEDQ  D:  04/14/2006  T:  04/14/2006  Job:  [id number]

## 2011-05-16 NOTE — Procedures (Signed)
[facility]  Patient:    [name], [name]                      MRN: [id number] Adm. Date:  [id number] Attending:  Evlyn Clines                           Procedure Report  PROCEDURE:  Cystoscopy, urethral and bladder dilation and instillation of Clorpactin, Pyridium and Marcaine.  PREOPERATIVE DIAGNOSIS:  Interstitial cystitis.  POSTOPERATIVE DIAGNOSIS:  Interstitial cystitis.  SURGEON:  Excell Seltzer. Annabell Howells, M.D.  ANESTHESIA:  General.  COMPLICATIONS:  None.  INDICATION:  [name] is a 50 year old white female with chronic interstitial cystitis who has had recurrent symptoms despite intensive conservative management.  She has elected to undergo hydrodistention of the bladder with instillation of Clorpactin.  FINDINGS AND PROCEDURE:  The patient was taken to the operating room after receiving p.o. antibiotic.  A general anesthetic was induced.  She was placed in the lithotomy position.  Her perineum and genitalia were prepped with Betadine solution and she was draped in the usual sterile fashion.  Cystoscopy was performed using a 22-French scope and the 12 and 70 degree lenses. Examination revealed a normal urethra.  The bladder wall was smooth with some increased vascularity but no evidence of tumor, stones or inflammation.  The ureteral orifices were in the normal anatomic position, effluxing clear urine. The cystoscope was removed and an 18-French Foley catheter was inserted.  The balloon was filled with 10 cc of air.  The bladder was then filled under 80 cmH20 pressure to capacity.  This was held for five minutes and the bladder was drained.  Her capacity under anesthesia was 800 cc.  The terminal efflux was bloody.  At this point, her bladder was instilled with approximately 300 cc of Clorpactin solution, 1 ampule in 1 L.  This was left indwelling for approximately three minutes.  The urethra was then calibrated to 34-French with female  sounds.  There was no evidence of narrowing.  She then underwent instillation of 400 mg of Pyridium dissolved in 30 cc of 0.25% Marcaine.  At this point, she was taken down from the lithotomy position, her anesthetic was reversed and she was moved to the recovery room in stable condition.  There were no complications.DD:  07/02/00 TD:  07/02/00 Job: [id number]

## 2011-05-16 NOTE — Discharge Summary (Signed)
[name], [name]:  [id number]   MEDICAL RECORD NO.:  [id number]          PATIENT TYPE:  IPS   LOCATION:  0506                          [facility]ILITY[facility]  [facility]   PHYSICIAN:  Geoffery Lyons, M.D.      DATE OF BIRTH:  [birth year]   DATE OF ADMISSION:  03/07/2007  DATE OF DISCHARGE:  03/11/2007                               DISCHARGE SUMMARY   CHIEF COMPLAINT AND PRESENTING ILLNESS:  This was one of multiple recent  admissions to [facility] for this 50 year old white  female voluntarily admitted.  Claimed visual hallucinations, terrible  nightmares, unable to sleep, feeling that she was having medication side  effects, tearful, claiming that she was seen threatening images of  knives, more depressed on the Prozac.  She would harm herself, having  suicidal thoughts, migraine headaches.   PAST PSYCHIATRIC HISTORY:  Had been admitted back-to-back from February 23, 2007, to March 05, 2007; January 07, 2007, through January 13, 2007;  then December 16, 2006, to December 23, 2006; and December 05, 2006, to  December 08, 2006.   ALCOHOL AND DRUG HISTORY:  Claims no substance abuse.   MEDICAL HISTORY:  1. Migraine headache.  2. Sleep apnea.  3. Fibromyalgia.  4. Rheumatoid arthritis.  5. Irritable bowel syndrome.  6. Hypertension.   MEDICATIONS:  1. Klonopin 1 mg three times a day.  2. Fentanyl patch 75 mcg daily every 72 hours.  3. Geodon 40 mg twice a day.  4. Hydrochlorothiazide.  5. Lamictal 25 mg per day.  6. Lyrica 150 mg twice a day.  7. Oxycodone IR 15 mg every 8 hours as needed for breakthrough pain.  8. Prozac 60 mg per day.  9. Requip 2 mg in the morning and at night.  10.Trazodone 150 mg at bedtime.  11.Spiriva inhaler 18 mcg daily.  12.Estradiol patch.  13.Robaxin 350 mg every 8 hours.   PHYSICAL EXAMINATION:  Performed and failed to show any acute findings.   LABORATORY WORK:  CBC:  White blood cells 8, hemoglobin 12.  Drug  screen  positive for opiates, benzodiazepines.  Sodium 138, potassium 3.7,  glucose 103, BUN 12, creatinine 0.79, SGOT 27, SGPT 28.   MENTAL STATUS EXAM:  Fully alert and cooperative female.  Mood  depressed.  Affect depressed.  Thought processes logical, coherent and  relevant.  Endorsed suicidal ideations but can contract for safety.  No  evidence of delusions.  No homicidal ideas, no hallucinations.  Cognition well preserved.   ADMISSION DIAGNOSES:  AXIS I:  Major depression, recurrent.  AXIS II:  No diagnosis.  AXIS III:  Irritable bowel syndrome.  Rheumatoid arthritis.  Migraine  headaches.  Fibromyalgia.  AXIS IV: Moderate.  AXIS V:  Upon admission 35, highest in the year 60.   COURSE IN THE HOSPITAL:  She was admitted, started individual and group  psychotherapy.  She was maintained on her medication but Prozac was  discontinued and she was placed on Effexor XR 37.5 mg per day.  This was  increased to 75 mg.  Placed on Lamictal 25 mg.  As already stated, this  was a 50 year old female readmitted after being discharged March 7.  Claims she went with a cousin, felt increasingly more depressed, and she  was hallucinating and was seeing knives.  Hard times.  Started thinking  about killing herself, went into a very stressful situation at the  cousin's house, said that she could not handle the way she was feeling,  started feeling suicidal and got scared that she would carry out the  plans.  Felt that the medications were not helping.  In looking back,  has been on Prozac, Paxil, Zoloft, Celexa, Lexapro, Cymbalta, and  Wellbutrin.  Seemed that Effexor was the best and was wanting to give  this medication another try, so we went ahead and started it.  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  Continued to work on Pharmacologist, on  CBT, with some DBT component, mood swings, dreams of her mother still  wanting to go with her and cannot get past the thought that she is a  paramedic and could not save her, and that she has saved a lot of people  before but she could not save her own mother, dreams, flashbacks of her  trying to revive her mother.  Has not been able to see herself as able  to move on, feels her whole life has changed, and little support from  family, although she claims they say the right things.  Considering  going back with boyfriend as she was feeling very lonely, although this  was a very conflicted relationship.  A little endorsed pain, little  sleep.  We worked with the meds.  On March 13, she turned around and she  was in full contact with reality.  Endorsed no active suicidal or  homicidal ideas.  No hallucinations.  No delusions.  Endorsed she was  better.  Endorsed that she had bad fibromyalgia days but was feeling  better, and she had an appointment on the same day, March 13, with her  PCP at 3 p.m. who was going to continue to reassess her pain management.  Concerned that if she were to miss that appointment, she was not going  to be able to see him for a long time, and she needed to have some  reassessment of her pain management regimen.   DISCHARGE DIAGNOSES:  AXIS I:  Major depression, recurrent.  Posttraumatic stress disorder.  AXIS II:  No diagnosis.  AXIS III:  Irritable bowel syndrome.  Rheumatoid arthritis.  Migraine  headaches.  Fibromyalgia.  AXIS IV:  Moderate.  AXIS V:  Upon discharge 51.   DISCHARGE MEDICATIONS:  1. Lyrica 150 mg twice a day.  2. Requip 2 mg twice a day.  3. Spiriva 18 mg nebulizer daily.  4. Duragesic 75 mcg patch every 72 hours.  5. Klonopin 1 mg three times a day.  6. Protonix 40 mg twice a day.  7. Climara 0.1 mg every 7 days.  8. Effexor XR 75 mg per day.  9. Lamictal 25 mg per  day. 10.Ventolin 2.5 mg every 4 hours.  11.Roxicodone 15 mg every 8 hours.  12.Trazodone 50 mg at bedtime as needed.   FOLLOW-UP CARE:  [facility] [place] [place].  Geoffery Lyons, M.D.  Electronically Signed     IL/MEDQ  D:  04/07/2007  T:  04/08/2007  Job:  [id number]

## 2011-05-16 NOTE — Assessment & Plan Note (Signed)
[facility]                                 ON-CALL NOTE   NAME:GAINES, Eletha                        MRN:          [id number]  DATE:11/28/2006                            DOB:          08-21-1961    CALL DATE:  November 28, 2006.   Patient's phone number:  [phone]. Call time:  9 a.m.   PROBLEM:  The patient called complaining of bronchitis and reported  coughing green and brown sputum. She had recently been put on prednisone  by Dr. Margaretmary Bayley. She says her mother died, and therefore she missed  last appointment at our office. She was asking for [facility] and  an antibiotic which was discussed.   PLAN:  Tessalon Perles 100 mg #30, one or two q.6 h., Zithromax Z-Pak  take as directed. Will call to her drug store, [phone]. She is call  office for followup appointment.     Clinton D. Maple Hudson, MD, Tonny Bollman, FACP  Electronically Signed    CDY/MedQ  DD: 11/30/2006  DT: 12/01/2006  Job #: [id number]

## 2011-05-16 NOTE — Consult Note (Signed)
[name], [name]               ACCOUNT NO.:  [id number]   MEDICAL RECORD NO.:  [id number]          PATIENT TYPE:  INP   LOCATION:  A309                          FACILITY:  [facility]   PHYSICIAN:  Kofi A. Gerilyn Pilgrim, M.D. DATE OF BIRTH:  Jan 17, 1961   DATE OF CONSULTATION:  04/15/2006  DATE OF DISCHARGE:                                   CONSULTATION   REASON FOR CONSULTATION:  Pain & headache.   HISTORY:  This is a 50 year old white female who has a history of back pain.  She has had imaging of the lumbar/thoracic regions. She has seen the  neurosurgeons in the [place] who said she had some mild disease and  should have not have surgery.  However, the patient has had significant pain  in the low back region which radiates down both legs, especially the left  side. She does report having some paresthesias involving the left leg. The  patient's symptoms apparently got abruptly worse and she fell in February of  this year. She apparently had a large indentation in her buttocks on the  left side which made the neuropathic symptoms on the left leg a lot worse.  She has had difficulty ambulating and has had problems walking around since  that fall a few weeks ago. The patient's pain has been so severe that she  was admitted for further evaluation. She has had a lumbar spine MRI done on  April 16th during this hospitalization which showed some mild degenerative  changes, especially involving the facet, more particularly L5-S1. The other  levels appear unremarkable. There is no clear herniated disk. She has also  had a thoracic spine MRI which was negative and this was conducted in  November 2006. The cervical spine MRI was also conducted in November 2006  showing a central disk protrusion at C5-6 and C6-7, with effacement of CSF.  There is no spinal cord compression. She does report having some neck  pain.Most of thisseems to be involved in the lumbar region. She has been on  several  medications including Lyrica, Cymbalta, Neurontin, which she has  taken for many years. She has also been on Prozac, Ultram, and lidocaine  patches. Most recently she has been on hydrocodone 10/50 and this was  subsequently switched over to oxycodone 15 mg. The Lyrica was started  recently and she believes this does help. She cannot take the Cymbalta. The  patient endorses having insomnia due to the low back pain. She also has some  paresthesias involving the right leg, although the left leg seems more  symptomatic.   PAST MEDICAL HISTORY:  Unremarkable. When she fell she apparently developed  a hematoma on the buttock region on the left.   REVIEW OF SYSTEMS:  Negative.   PHYSICAL EXAMINATION:  GENERAL: This is a mildly overweight lady in no acute  distress.  VITAL SIGNS: Temperature 98.2, pulse 70, respirations 20, blood pressure  113/73.  HEENT: Head is normocephalic and atraumatic.  NECK: Supple.  ABDOMEN: Soft.  EXTREMITIES: No varicosities or edema. The patient is noted to have a  swollen area in  the mid buttocks on the left side.  NEUROLOGIC: Mentation--she is awake, alert, and converses well. Speech,  language, and cognition are intact. Cranial nerve evaluation--pupils equal,  round, and reactive to light and accommodation. Extraocular movements are  full. Facial muscle strength is symmetric. Visual fields are intact. Tongue  is midline. Motor examination shows normal tone, bulk, and strength of the  upper extremities. There appears to be a little give way weakness of the  left leg. Coordination is intact. Reflexes are normal with plantar reflexes  both downgoing. Gait is normal. There is no spasticity.   ASSESSMENT:  1.  A lady with neuropathic symptoms involving the left leg, with negative      imaging. She certainly may have had in the distant past residual      symptoms. Her most recent event a few weeks ago resulted in hematoma and      may have caused damage of the  lumbar plexus. This will need to be      started out probably with EMGs. I am somewhat concerned about multiple      symptoms as she could have multiple sclerosis and a brain MRI should      probably be done.  2.  Neck pain.  3.  Insomnia.   RECOMMENDATIONS:  1.  MRI of the brain.  2.  Imipramine.  3.  Fentanyl patch 25 mcg, increase as tolerated.   Further recommendations will depend upon the initial evaluation.      Kofi A. Gerilyn Pilgrim, M.D.  Electronically Signed     KAD/MEDQ  D:  04/16/2006  T:  04/16/2006  Job:  [id number]

## 2011-05-16 NOTE — Op Note (Signed)
[facility]  Patient:    [name], [name] Visit Number: [id number] MRN: [id number]          Service Type: DSU Location: DAY Attending Physician:  Evlyn Clines Dictated by:   Excell Seltzer. Annabell Howells, M.D. Proc. Date: 09/21/01 Admit Date:  09/21/2001                             Operative Report  PREOPERATIVE DIAGNOSIS:  Interstitial cystitis.  POSTOPERATIVE DIAGNOSIS:  Interstitial cystitis.  PROCEDURES:  Cystoscopy, hydrodistention of the bladder, urethral dilation, instillation of Clorpactin, Pyridium, and Marcaine.  SURGEON:  Excell Seltzer. Annabell Howells, M.D.  ANESTHESIA:  General.  COMPLICATIONS:  None.  INDICATION:  [name] is a 50 year old white female with a long history of interstitial cystitis, who has had multiple hydrodistentions.  She has had recurrent symptoms recently and is to undergo a repeat hydrodistention.  FINDINGS AND PROCEDURE:  The patient was given p.o. Tequin.  She was taken to the operating room, where general anesthetic was induced.  She was placed in lithotomy position.  Her perineum and genitalia were prepped with Betadine solution, and she was draped in the usual sterile fashion.  Cystoscopy was performed using a 22 [place] scope and the 70 degree lens.  Examination revealed a normal bladder wall, normal ureteral orifices, and an unremarkable urethra.  The cystoscope was removed, and an 54 French Foley catheter was inserted.  The balloon was filled.  The bladder was then filled under 80 cmH2O pressure to capacity.  This was held for five minutes.  The bladder was then drained.  The capacity under anesthesia was 700 cc.  The terminal efflux was bloody.  Re-cystoscopy after hydrodistention revealed glomerulations of the lateral wall upon the right and left.  At this point the bladder was drained, the Foley catheter was reinserted, and 300 cc of Clorpactin 1 amp in 1 L of saline was instilled.  This was left indwelling for two minutes.   The urethra was calibrated with female sounds to 1 [place].  She was tight at 36.  The bladder was drained after the two minutes of Clorpactin dwell time, and the bladder was then instilled with 30 cc of 0.25% Marcaine with 400 mg of crushed Pyridium.  She was then taken down from lithotomy position after irrigation of the perineum.  Her anesthetic was reversed.  She was removed to recovery in stable condition.  There were no complications. Dictated by:   Excell Seltzer. Annabell Howells, M.D. Attending Physician:  Evlyn Clines DD:  09/21/01 TD:  09/21/01 Job: [id number]

## 2011-05-16 NOTE — Assessment & Plan Note (Signed)
[name]                             PULMONARY OFFICE NOTE   NAME:[name], [name] GLIDDEN                      MRN:          [id number]  DATE:04/20/2007                            DOB:          [birth year]    PROBLEM:  1. Bronchitis/chronic obstructive pulmonary disease.  2. Depression.  3. Migraines.  4. Sleep apnea.  5. Fibromyalgia.  6. Rheumatoid arthritis.  7. Irritable bowel.  8. Hypertension.   HISTORY:  She continues to smoke.  She had seen the nurse practitioner  several times during the winter and had to be hospitalized at [name] from February 26 through March 6, and from March 9 through March  13.  Discharge medications from her most recent discharge summary:  Lyrica 150 mg b.i.d., Requip 2 mg b.i.d., Spiriva once daily, Duragesic  75 mcg patch every 72 hours, Klonopin 1 mg t.i.d., Protonix 40 mg  b.i.d., Climara 0.1 mg every seven days, Effexor XR 75 mg, Lamictal 25  mg, Ventolin nebulizer every four hours p.r.n., Roxicodone 15 mg every  eight hours, trazodone 50 mg nightly p.r.n.   DRUG INTOLERANCE:  PREVIOUSLY TO TIGAN, COMPAZINE, PHENERGAN, STADOL,  BIAXIN, PREDNISONE.   She complains that fentanyl patches for fibromyalgia are not working  well.  Complains of a squeeze and burning pain along the left upper  abdominal quadrant with no relation to meals or position.  She is  needing CT scan followup of a right upper lobe nodule and a left lower  lobe infiltrate seen on films this winter.  Frequent heartburn is helped  by Zantac.  She expresses a good deal of distress about her father's  adjustment since his wife died.   OBJECTIVE:  VITAL SIGNS:  Weight 152 pounds.  Blood pressure 102/68.  Pulse regular 85.  Room air saturation 95 percent.  GENERAL:  She is rather whiny but coherent.  When distracted, she will  smile and carry on conversation.  Speech is a little bit nasal and  stuffy with only mild turbinate edema, no  postnasal drainage.  No  adenopathy.  LUNGS:  Clear and quiet, with no cough or wheeze.  HEART:  Heart sounds regular without murmur or gallop.  ABDOMEN:  She was soft in the left and right upper quadrants.  I could  feel no organomegaly.  I could hear no bruit.  There is no evidence of  guarding.   IMPRESSION:  1. Tobacco abuse.  2. Lung nodules/pneumonia needing followup.  3. Mild seasonal rhinitis.  4. Active reflux.   PLAN:  1. CT scan of the chest, noncontrast, for comparison with her November      CT.  2. Smoking cessation was heavily emphasized.  3. Try Veramyst nasal spray once each nostril daily.  4. Schedule return in three weeks, earlier p.r.n.     [name] D. Maple Hudson, MD, [name], FACP  Electronically Signed    CDY/MedQ  DD: 04/22/2007  DT: 04/23/2007  Job #: [id number]   cc:   [name]. [name] Gambler, MD

## 2011-05-16 NOTE — Op Note (Signed)
[name], [name]               ACCOUNT NO.:  [id number]   MEDICAL RECORD NO.:  [id number]          PATIENT TYPE:  AMB   LOCATION:  DAY                          FACILITY:  [facility]   PHYSICIAN:  Excell Seltzer. Annabell Howells, M.D.    DATE OF BIRTH:  May 05, 1961   DATE OF PROCEDURE:  10/10/2004  DATE OF DISCHARGE:                                 OPERATIVE REPORT   PROCEDURE:  Cystoscopy, hydrodistention of the bladder, urethral dilation,  installation of Pyridium and Marcaine.   PREOPERATIVE DIAGNOSES:  Interstitial cystitis.   POSTOPERATIVE DIAGNOSES:  Interstitial cystitis.   SURGEON:  Excell Seltzer. Annabell Howells, M.D.   ANESTHESIA:  General.   COMPLICATIONS:  None.   INDICATIONS FOR PROCEDURE:  [name] is a 50 year old white female with a  history of IC who has recurrent bladder pain requiring hydrodistention  approximately every six months.   DESCRIPTION OF PROCEDURE:  She was taken to the operating room where a  general anesthetic was induced. She received p.o. antibiotics  preoperatively. She was placed in lithotomy position, her perineum and  genitalia were prepped with Betadine solution and she was draped in the  usual sterile fashion. Cystoscopy was performed with a 22 [place] scope and  the 70 degree lens. Examination revealed a normal urethra. The bladder wall  was smooth and pale with __________ the ureteral orifices were unremarkable.  The cystoscope was then removed and an 14 French Foley catheter was  inserted, the balloon was inflated, the bladder was then filled under 80  cmH2O pressure to capacity and held for five minutes. The bladder was then  drained to capacity under anesthesia and was only 600 mL.  The terminal  efflux was bloody.  The catheter was then removed, repeat cystoscopy was  performed and this revealed some glomerular hemorrhages on the right and  left lateral walls of the bladder that was not particularly severe.  At this  point, the urethra was calibrated from 28 to 33  [place] with female sounds.  She was tight at 34.  The bladder was then instilled with 30 mL of 0.25%  Marcaine with 400 mg of Pyridium and a B&O suppository was placed. The  patient was taken down from lithotomy position, her anesthetic was reversed  and she was removed to the recovery room in stable condition and there were  no complications.      JJW/MEDQ  D:  10/10/2004  T:  10/10/2004  Job:  [id number]

## 2011-05-16 NOTE — H&P (Signed)
NAMESTEPHENIE, [name]:  [id number]   MEDICAL RECORD NO.:  [id number]          PATIENT TYPE:  EMS   LOCATION:  ED                           FACILITY:  [facility]   PHYSICIAN:  Lonia Blood, M.D.       DATE OF BIRTH:  [birth year]   DATE OF ADMISSION:  02/09/2007  DATE OF DISCHARGE:                              HISTORY & PHYSICAL   The patient's primary care physician, Dr. Felecia Shelling in [place], [place] which makes the patient unassigned for [facility].   CHIEF COMPLAINTS:  Cough.   HISTORY OF PRESENT ILLNESS:  Mrs. [name] is a 50 year old woman with  history of depression and asthma who reports that for the past week she  has been getting progressively weaker and having difficulties with a  breathing, coughing.  The patient reports that they went to see Dr.  Maple Hudson in the office and received a dose of intravenous Solu-Medrol.  She  left from the office but continued to feel poorly and shortly after  leaving the office started having nausea, vomiting and was feeling  worse.  The patient presented back to the [facility] Room and  repeat chest x-ray showed worsening left lower lobe pneumonia.   PAST MEDICAL HISTORY:  1. Asthma.  2. Depression.  3. Fibromyalgia.  4. Interstitial cystitis.  5. Rheumatoid arthritis.  6. Restless leg syndrome.  7. Hysterectomy.  8. Cholecystectomy.  9. Migraine headaches.  10.Chronic pain syndrome.   HOME MEDICATIONS:  1. Spiriva 1 capsule daily.  2. Klonopin 1 mg twice a day.  3. Prozac 40 mg daily.  4. Fentanyl patch 75 mcg daily.  5. Requip 2 mg at bedtime.  6. Oxycodone 15 mg as needed.  7. Hydrochlorothiazide 25 mg daily.   SOCIAL HISTORY:  The patient smokes a few cigarettes a day.  Does not  drink alcohol.  She lives with a boyfriend.  She has recently been  through a traumatic event after she had to perform CPR on her own  mother.  The patient's family history is positive for sudden death in  the patient's mother.   DRUG ALLERGIES:  Are multiple and include intolerances to BIAXIN,  COMPAZINE, PHENERGAN, SUDAFED, TIGAN, TOFRANIL and STADOL.   REVIEW OF SYSTEMS:  Positive for the nausea and vomiting.  Negative for  abdominal pain.  Negative for chest pain.  Positive for migraine  headaches.  Positive for severe depression.   PHYSICAL EXAMINATION:  Upon admission, temperature 98.1, blood pressure  92/50, pulse 95, respirations 20s saturation 97% on room air.  GENERAL APPEARANCE:  A well-developed, well-nourished woman with  depressed mood in no acute distress sitting on the stretcher.  Alert and  oriented to place, person and time.  Calm and cooperative, mildly  anxious.  Her head appears normocephalic, atraumatic with eyes pupil  equal, round and react to light and accommodation.  Extraocular  movements intact.  Conjunctivae are pale.  Sclerae anicteric.  Throat  clear.  NECK:  Supple.  No JVD.  CHEST:  Crackles in the left base with rhonchi and localized wheezes  in  the left base.  No accessory muscle use and no tachypneic.  HEART:  Regular rate and rhythm without murmurs, rubs or gallop.  ABDOMEN:  The patient's abdomen is soft, nontender, nondistended.  Bowel  sounds are present.  LOWER EXTREMITIES: Have no edema.   ASSESSMENT AND PLAN:  1. Worsening left lower lobe pneumonia with nausea, vomiting and an      episode of worsening shortness of breath:  At this point in time, I      feel that it would be safer to admit Mrs. [name] for 23-hour      observation and to place her on intravenous antibiotics,      intravenous fluids, oxygen, nebulizer treatments and intravenous      steroids.  Further course will be dictated by way the patient will      feel in the next 23 hours.  2. Severe depression:  I will continue the Paxil and Klonopin without      changes.      Lonia Blood, M.D.  Electronically Signed     SL/MEDQ  D:  02/09/2007  T:  02/09/2007  Job:  [id number]

## 2011-05-16 NOTE — Discharge Summary (Signed)
NAMESHAHARA, HARTSFIELD NO.:  [id number]   MEDICAL RECORD NO.:  [id number]          PATIENT TYPE:  IPS   LOCATION:  0506                          FACILITY:  [facility]   PHYSICIAN:  Geoffery Lyons, M.D.      DATE OF BIRTH:  09-25-1961   DATE OF ADMISSION:  12/05/2006  DATE OF DISCHARGE:  12/08/2006                               DISCHARGE SUMMARY   CHIEF COMPLAINT, PRESENT ILLNESS:  This was the first admission to [facility] for this 50 year old divorced white female who  presented to the Emergency Department complaining of depression and  pain.  Her mother had died unexpectedly in 11/26/23 and she had to  perform CPR but wonders if she could have done more to save her life.  She has been thinking about killing herself with no real plan.  She has  not hurt herself to this point.   She complains of pain all over from her fibromyalgia.  She was using  Fentanyl patches and oxycodone to manage the pain at home.  She took  four methocarbamol muscle relaxers the night before, thinking that she  would not wake up.  She had felt earlier prior to this admission,  however, that was two times before.   TRIGGERS:  Childhood issues, marital issues.  She had treatment with  antidepressants on and off for 25 years; no prior inpatient.   PAST PSYCHIATRIC HISTORY:  As already stated, recording depression and  multiple events in the past, treatment with antidepressants on and off  for 25 years, more so with the family practice physician.  She started  taking antidepressants after postpartum depression after the birth of  her daughter, who is 25.   ALCOHOL AND DRUG HISTORY:  Denies the use of any substances.   MEDICAL HISTORY:  1. Fibromyalgia.  2. Claims nerve damage in her back, bulging disks.  3. Sciatica.  4. Rheumatoid arthritis.  5. Interstitial cystitis.   MEDICATIONS:  1. Prozac 40 mg per day; claims that it was increased up to 60 mg.  2. Klonopin 1 mg 3x  day.  3. Methocarbamol with aspirin 750 mg twice a day.  4. Albuterol inhaler as needed.  5. Spiriva in the morning.  6. Oxygen 2 L by nasal cannula.  7. Requip 2 mg one-half in the morning and one-half at bedtime.  8. Fentanyl patch 75 mcg every 2 days.  9. Oxycodone 50 mg every 6 hours as needed for breakthrough pain.  10.Hydrochlorothiazide 25 mg per day.  11.Zantac 150 mg per day.   PHYSICAL EXAMINATION:  Performed, __________  with findings.   LABORATORY WORKUP:  Sodium 138, potassium 3.8, glucose 87, BUN 6,  creatinine 0.9. Drug screen positive for opiates and benzodiazepines.   MENTAL STATUS EXAM:  Reveals an alert and cooperative female, casually  groomed and dressed.  Speech was normal in rate, tempo and production.  Mood was depressed and anxious.  Affect was congruent.  Thought process  was clear, rational and goal oriented.  Judgment and insight were  intact.  Concentration and memory were well preserved.  Endorsed pain,  __________  focused, being overwhelmed and being depressed.   DIAGNOSES:  Axis I:  Major depression  Axis II:  No diagnosis.  Axis III:  Fibromyalgia, interstitial cystitis, rheumatoid arthritis,  bulging disk, sciatica and nerve damage in her back.  Axis IV:  Moderate.  Axis V:  Upon admission 20; GAF in last year 60.   COURSE IN THE HOSPITAL:  She was admitted.  She started receiving  psychotherapy.  She was maintained on Prozac 60 mg per day, Klonopin 1  mg three times a day, Requip 2 mg one-half tablet in the morning and one-  half tablet in the afternoon, Fentanyl patch 75 mcg every 2 days.  She  was placed on Zyprexa 2.5 mg at night, and she was given trazodone for  sleep.  We started her on Lyrica 150 mg twice a day, and she was given  Lamictal 25 mg per day.   As already stated, she was admitted feeling depressed and overwhelmed.  She continued to have active suicidal ideas.  Her mother died and she  did CPR on her unexpectedly.  Since  then she ruminates that she could  have done more.  She had taken Effexor but notes that she experienced  increased weight. Tofranil increased the heart rate and rash.  Paxil  caused withdrawal.  She had tried Zoloft, Wellbutrin, Cymbalta and  Lexapro.  We went ahead and started Lamictal 25 mg per day and Prozac 60  mg.   She has been very upset since her mother died and she tried to do CPR  and was not successful, grieving for not having been able to help her.  As we continued to work she was able to open up and talk about the  trauma and her response to her mother's death.  She was able to start  addressing some of the feelings associated with her death.  We modified  the medications, and on December 11th, she was in full contact with  reality and endorsed that.  She understood that she was facing a lot of  stressful events as she got ready to go home but she was denying any  suicidal ideation and denied any homicidal ideations.  She is going to  go home with her fiancee.  She feels he is supportive.   She was denying any acute suicidal or homicidal ideations.  She is going  with the fiancee, who she says is supportive.  She was going to get a  lawyer to help get her disability, and she is going to pursue medication  further.   DISCHARGE DIAGNOSES:  Axis I:  Major depression, post-traumatic stress  disorder.  Axis II:  No diagnosis.  Axis III:  Fibromyalgia, interstitial cystitis, rheumatoid arthritis,  bulging disk, sciatica and nerve damage in her back.  Axis IV:  Moderate.  Axis V:  Upon discharge 50-55.   She was discharged on:  1. Klonopin 1 mg three times a day.  2. Duragesic 75 mcg every 48 hours.  3. Trazodone 50 mg at bedtime.  4. Prozac 23 daily.  5. Spiriva 80 mg daily in the morning.  6. Requip 1 mg one in the morning and one at night.  7. Hydrochlorothiazide 25 mg per day.  8. Lamictal 25 mg per day. 9. Lyrica 150 mg twice per day.  10.Albuterol one puff as  needed.  11.Roxicodone 5 mg three every 6 hours as needed for breakthrough      pain.  FOLLOW UP:  [facility].      Geoffery Lyons, M.D.  Electronically Signed     IL/MEDQ  D:  12/30/2006  T:  12/31/2006  Job:  [id number]

## 2011-05-16 NOTE — Discharge Summary (Signed)
[facility]  Patient:    [name], [name]                      MRN: [id number] Adm. Date:  [id number] Disc. Date: [id number] Attending:  Rosanne Sack CC:         Willis Modena. Dreiling, M.D.  Excell Seltzer. Annabell Howells, M.D.  Eliezer Bottom, M.D.  Anselmo Rod, M.D.   Discharge Summary  DATE OF BIRTH:  10-10-1961  DISCHARGE DIAGNOSES: 1. Tylenol and benzodiazepine overdose in a suicidal attempt associated with    mild hepatotoxicity. 2. Major depression, recurrent. 3. Panic disorder with agoraphobia. 4. Interstitial cystitis. 5. Hiatal hernia and gastroesophageal reflux disease. 6. Normocytic anemia, hemoglobin 10.9, mean corpuscular volume 87. 7. Intolerant to morphine, Demerol, Biaxin, decongestants, and imipramine.  DISCHARGE MEDICATIONS: 1. Prozac 40 mg p.o. q.d. 2. Bentyl 20 mg four times a day. 3. Majic mouthwash three times a day. 4. Nicotine patch, three-week protocol. 5. Neurontin 300 mg in a.m., 600 mg in p.m. 6. Nexium 40 mg once a day.  DISCHARGE FOLLOW-UP AND DISPOSITION:  [name] will be followed at the [facility] by Dr. Norva Pavlov on November 06, 2000.  At that time, Dr. Lattie Corns will obtain a new set of LFTs to confirm a total or close resolution of her mild hepatotoxicity associated with the mild ingestion of Darvocet.  An intensive outpatient Behavioral Health appointment was arranged prior to discharge.  Dr. Evelene Croon will also continue following [name] from the major depression standpoint within one to two weeks.  CONSULTATIONS:  Adelene Amas. Williford, M.D., psychiatry.  PROCEDURES:  None.  DISCHARGE LABORATORY DATA:  Alkaline phosphatase 49, SGOT 36, SGPT 64, albumin 2.9, Tylenol level 12.6 on November 01, 2000.  Negative urine pregnancy test. Negative for urinalysis.  Sodium 141, potassium 3.7, chloride 112, CO2 24, BUN less than 5, creatinine 0.8, glucose 99.  Total bilirubin 0.3.  PT 14.7, PTT 28,  hemoglobin 10.9, MCV 87, WBC 10.4, platelets 263.  HOSPITAL COURSE: #1 - OVERDOSE:  [name] is a very pleasant 50 year old female admitted to [facility] on October 31, 2000, with an intentional overdose of benzodiazepine on Darvocet in a suicidal attempt.  The patient was admitted for supportive care and close monitoring.  The patient received activated charcoal in the emergency department.  ______ also was started.  The Tylenol level was 100 about 4 hours post ingestion.  Thirty minutes later, this Tylenol level was 88.  The following day, the Tylenol level came down to 12.6. The patient required IV fluids due to mild hypotension on admission.  Mild lethargy also was noticed on arrival to the emergency department thought to be associated with 8 to 10 tablets of Xanax (2 mg tablets) that she took 4 hours earlier.  The lethargy resolved by day #2 without complications.  The LFTs ______ SGOT 96, SGPT 82.  The day prior to discharge, the SGOT was down to 36 and the SGPT down to 64.  Dr. Jeanie Sewer saw the patient in consultation who agreed with the diagnosis of major depression associated with panic disorder and agoraphobia.  Dr. Jeanie Sewer recommended to increase the Prozac to 40 mg a day and add Bentyl.  The patient was cleared from the psychiatry standpoint to be discharged with outpatient follow-up.  Intensive outpatient group therapy sessions were arranged prior to discharge.  Dr. Evelene Croon, the patients psychiatrist, also will continue following [name] from the depression standpoint.  #  2 - HEPATOTOXICITY SECONDARY TO TYLENOL OVERDOSE:  See problem #1.  Dr. Amada Jupiter Dreiling will repeat another set of LFTs on November 06, 2000, at the [facility].  MEDICAL CONDITION AT TIME OF DISCHARGE:  Improved. DD:  11/03/00 TD:  11/03/00 Job: [id number]

## 2011-05-16 NOTE — H&P (Signed)
[facility]  Patient:    [name], [name]                      MRN: [id number] Adm. Date:  [id number] Attending:  Jetty Duhamel T CC:         Excell Seltzer. Annabell Howells, M.D.  Anselmo Rod, M.D.  Eliezer Bottom, M.D.   History and Physical  ADMITTING DIAGNOSIS:  Intentional overdose of benzodiazepine, Darvocet-N 100.  HISTORY OF PRESENT ILLNESS:  [name] is a 50 year old divorced woman, who comes in because she took eight to ten 2 mg Xanax in addition to eight to ten Darvocet-N 100.  The purpose of this was to be able to go to sleep.  She states that her ex-husband has recently withdrawn all financial support from her.  She is currently out of a job.  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  She also has skills in data entry.  She does see Dr. Milagros Evener, who provides her medications.  The patient lives alone. She has had suicidal ideation in the past, but no previous suicide attempts.  CURRENT MEDICATIONS: 1. Xanax 2 mg t.i.d. 2. Prozac 20 mg q.d. 3. Neurontin 300 mg in the morning and 600 mg in the evening. 4. Darvocet-N 100 1 p.o. q.6h. p.r.n. pain from interstitial cystitis. 5. She takes Nexium 40 mg q.d.  PHYSICIANS:  She sees Dr. Charna Elizabeth for GI complaints.  She sees Dr. Annabell Howells for interstitial cystitis and Dr. Evelene Croon for psychiatric problems.  She also sees Dr. Amada Jupiter Dreiling as her primary care physician, but she does frequent the [facility] and sees Dr. Merla Riches there.  MEDICAL PROBLEMS:  Her medical problems include depression, interstitial cystitis, hiatal hernia, and GERD.  ALLERGIES:  She is allergic or intolerant of the following medications: MORPHINE, DEMEROL, BIAXIN, DECONGESTANTS, and IMIPRAMINE.  SOCIAL HISTORY:  The patient has been in a three year long divorce process. She was married four years prior to that.  She has no children.  Her ex-husband has currently cut her off financially.  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  She smokes 1-1/2 packs of cigarettes for the past three years.  Rare to no alcohol use.  FAMILY HISTORY:  Depression is very prominent in the family, in mom, dad, brother and sisters.  Diabetes in paternal grandmother.  Hypertension in mom. Coronary artery disease in paternal grandfather and maternal grandfather. Cancer, uncle with some kind of tumor in the back.  REVIEW OF SYSTEMS:  The patient complains of a headache and being hungry.  She denies blurred vision.  No chest pain.  No shortness of breath.  No dysuria. She is currently feeling blue and lethargic.  She is somewhat despondent about psychosocial issues.  PHYSICAL EXAMINATION:  VITAL SIGNS:  The patient was hypotensive on arrival.  Blood pressure 74/60. She received oral charcoal, plus 3 L of fluid and now blood pressure is 124/78, in Trendelenburg, however.  Her pulse is 80.  Respirations 20.  She is afebrile.  HEENT:  Pupils are slightly sluggish, but extraocular muscles are intact bilaterally.  Conjunctivae are normal.  Cranial nerves II-XI are intact.  The patient is alert and oriented, although somewhat slow in her speech at the time of my examination.  Oropharynx is charcoal stained, but otherwise clear. TMs are normal.  NECK:  Supple, without lymphadenopathy.  LUNGS:  Clear to auscultation.  HEART:  Regular rate  and rhythm, without murmur.  BREASTS:  Normal.  ABDOMEN:  Increased bowel sounds.  No hepatosplenomegaly.  No masses.  PELVIC:  Not done.  Not pertinent to current problem.  RECTAL:  Deferred.  No pertinent to current problem.  EXTREMITIES:  Without edema or cyanosis.  NEUROLOGICAL:  Sensory is intact to pinprick and light touch.  Her strength is 5/5.  LABORATORY DATA:  Her liver test showed slight elevation of her SGOT at 41, with the rest of the liver tests normal.  Her white count was 10.1, hematocrit 36.7, hemoglobin 13.1 with an  elevated platelet count of 455,000, 250 segs, 30 lymphs, 5 monos, and 2 eosinophils.  Her chemistry panel is normal, with a slight elevation of her glucose at 133.  Her potassium is slightly low at 3.4. Her acetaminophen level was 99.5 at 6:25 p.m. and at 7:10 p.m. it was 88 and going down.  ASSESSMENT AND PLAN: 1. Intentional overdose with Xanax and Darvocet.  She is now alert sand not    suicidal.  She sees Dr. Evelene Croon, who will need to see the patient.  She does    have a mild increase of her liver tests.  She has a nontoxic level of    Tylenol, with levels decreasing.  Her hypotension is resolving with fluids    and currently will admit for telemetry observation.  She will need to be    seen by her psychiatrist. 2. Interstitial cystitis.  She does take Darvocet.  This will need to be    discussed with Dr. Annabell Howells due to the suicide attempt. DD:  10/31/00 TD:  11/01/00 Job: [id number]

## 2011-05-16 NOTE — H&P (Signed)
[name], [name]               ACCOUNT NO.:  [id number]   MEDICAL RECORD NO.:  [id number]          PATIENT TYPE:  AMB   LOCATION:  SDC                           FACILITY:  [facility]   PHYSICIAN:  Guy Sandifer. Tomblin II, M.D.DATE OF BIRTH:  [birth year]   DATE OF ADMISSION:  02/27/2005  DATE OF DISCHARGE:                                HISTORY & PHYSICAL   CHIEF COMPLAINT:  Pelvic pain.   HISTORY OF PRESENT ILLNESS:  This patient is a 50 year old, divorced, white  female, G2, P1, status post laparoscopically assisted vaginal hysterectomy,  status post left salpingo-oophorectomy, with severe and persistent right  lower quadrant pain.  She has a known history of endometriosis.  Her pain  has gone from being cyclic to basically continuous in the right lower  quadrant.  She also has disabling dyspareunia.  Ultrasound, on February 17, 2005, was consistent with a 1.5-cm cyst on her remaining right ovary that is  hemorrhagic versus an endometrioma.  After a careful discussion of the  options, she is being admitted for laparoscopy with probable right salpingo-  oophorectomy.  Potential risks and complications of surgery have been  reviewed preoperatively.  Issues of menopause and possible treatments have  also been reviewed preoperatively.   PAST MEDICAL HISTORY:  1.  Interstitial cystitis.  2.  History of asthma.  3.  Mitral valve prolapse.  4.  Depression.  5.  Endometriosis.  6.  Fibromyalgia.  7.  Rheumatoid arthritis.  8.  Irritable bowel syndrome.   PAST SURGICAL HISTORY:  1.  Status post LAVH.  2.  Status post subsequent LSO.  3.  She has had multiple laparoscopies for diagnosis and treatment of      endometriosis and pelvic pain.  4.  Tonsillectomy in 1982.  5.  She has had multiple urethral dilations, numbering greater than 12-15.   MEDICATIONS:  1.  Prozac 40 mg daily.  2.  Neurontin 1200 mg.  3.  Klonopin 0.5 mg daily.  4.  Lortab p.r.n.  5.  Albuterol p.r.n.  6.  Advil  p.r.n.   ALLERGIES:  1.  STADOL.  2.  ALL DECONGESTANTS.  3.  PHENERGAN.  4.  COMPAZINE.  5.  TIGAN.  6.  TOFRANIL.  7.  BIAXIN.  8.  MORPHINE.  9.  DEMEROL.   FAMILY HISTORY:  Insulin-dependent diabetes in paternal grandmother and  maternal grandmother.  Cervical cancer in sister.  Stroke in maternal  grandmother.  Coronary artery disease in paternal grandmother.   SOCIAL HISTORY:  The patient denies tobacco, alcohol, or drug abuse.   REVIEW OF SYSTEMS:  NEURO:  Denies headache.  CARDIAC:  Denies chest pain.  PULMONARY:  History of asthma as above.  GI:  Has had nausea and vomiting  associated with pain, also right lower quadrant pain as described above.   PHYSICAL EXAMINATION:  VITAL SIGNS:  Height 5 feet 2 inches, weight 149  pounds, blood pressure 104/68.  HEENT:  Without thyromegaly.  LUNGS:  Clear to auscultation.  HEART:  Regular rate and rhythm.  BACK:  Without CVA tenderness.  BREAST:  With bilateral expressible galactorrhea.  No masses or retraction.  ABDOMEN:  Soft.  Bilateral lower quadrant tenderness, greater on the right  than on the left.  No rebound or masses.  PELVIC:  Vulva, vagina without lesion.  There is tenderness at the vaginal  cuff, especially on the right.  No masses palpable.  EXTREMITIES:  Grossly within normal limits.  NEUROLOGIC:  Grossly within normal limits.   ASSESSMENT:  Pelvic pain with a history of endometriosis.   PLAN:  Laparoscopy probable right salpingo-oophorectomy.      JET/MEDQ  D:  02/26/2005  T:  02/26/2005  Job:  [id number]

## 2011-05-16 NOTE — Assessment & Plan Note (Signed)
[facility]                                   ON-CALL NOTE   NAME:[name], [name] KUENNEN                      MRN:          [id number]  DATE:11/07/2006                            DOB:          1961-01-25    ON CALL TELEPHONE NOTE:  [name] called today, November 07, 2006 to  notify me that she is having severe joint pain, diffuse myalgias, weakness  and fatigue.  She is followed by Dr. Fannie Knee and tells me that she has a  history of either adrenal insufficiency or addisonian crisis. She was able  to describe in general her symptoms and prior treatment but was not able to  tell me which of these problems has been her actual diagnosis.  She states  that in consultation with Dr. Maple Hudson, arrangements have been made for her to  see Dr. Margaretmary Bayley for endocrinology evaluation.  She has not yet seen  him; however, this weekend she is experiencing similar symptoms but feels  that they are worsening.  She is tearful on the phone and is unable to tell  me any new specific complaints, just that her overall stated health is  declining.  I have asked her to go to the nearest emergency department for  evaluation which is at [facility].  There they will be able to  perform the laboratory assessment and will be able to confirm whether an  outpatient workup is adequate or whether she needs to be seen in an  inpatient setting.  She stated that she would go to the emergency department  today.  I called the [facility] and spoke to Dr. Radford Pax  who will be expecting to see [name].  I reviewed the issues as she  described them to me, for him.    ______________________________  Leslye Peer, MD    RSB/MedQ  DD: 11/07/2006  DT: 11/07/2006  Job #: [id number]   cc:   Joni Fears D. Maple Hudson, MD, FCCP, FACP

## 2011-05-16 NOTE — Discharge Summary (Signed)
NAMEEVALENE, [name]:  [id number]   MEDICAL RECORD NO.:  [id number]          PATIENT TYPE:  IPS   LOCATION:  0508                          FACILITY:  [facility]   PHYSICIAN:  Anselm Jungling, MD  DATE OF BIRTH:  Jun 19, 1961   DATE OF ADMISSION:  12/16/2006  DATE OF DISCHARGE:  12/24/2006                               DISCHARGE SUMMARY   IDENTIFYING DATA/REASON FOR ADMISSION:  This was the second [facility]  admission for [name], a 50 year old divorced white female who had been  discharged from Dr. Runell Gess service approximately two weeks prior to this  readmission.  She continued with worsening depression.  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  The  patient had been started on a trial of Lamictal here, but discontinued  it at home due to nausea.  The patient came to [place] with multiple medical  issues as well including chronic pain.  Please refer to the admission  note for further details pertaining to the symptoms, circumstances and  history that led to her hospitalization.   INITIAL DIAGNOSTIC IMPRESSION:  She was given an initial AXIS I  diagnosis of depressive disorder not otherwise specified, bereavement,  and pain disorder.   MEDICAL/LABORATORY:  The patient came to [place] with a history of arthritis,  fibromyalgia, asthma, interstitial cystitis and degenerative disk  disease.  She was medically and physically assessed by the psychiatric  nurse practitioner.  She was continued on nonpsychotropic medications  for the above, including Protonix, Requip, Spiriva, Duragesic patch,  Nasonex, and Lyrica.  There were no acute medical issues.   HOSPITAL COURSE:  The patient was admitted to the adult inpatient  psychiatric service.  The patient presented as a well-nourished, well-  developed woman who was alert and fully oriented, and very pleasant but  sad.  She  denied any active suicidal ideation.  There were no signs or  symptoms of psychosis.  She verbalized a strong desire for help.  Her  medication situation was reassessed.  She was continued on Paxil 10 mg  daily to address depression, and Klonopin 1 mg t.i.d.  She was involved  again in various therapeutic groups and activities and was a good  participant in the milieu program.  She did not show any further  suicidal ideation throughout her inpatient stay.   After approximately seven days in the inpatient unit, she continued  depressed, anxious and fragile.  On December 22, 2006, Christmas day,  she announced that she would like to go home for Christmas but, after  extensive discussion with her, she admitted that she was not ready for  this.  She indicated that she was feeling more than the usual level of  pain and requested an increase in her fentanyl dose.  I was reluctant to  do so, but agreed to a trial of Lidoderm skin patch to her low back, her  site of pain.   On the day prior to discharge, the patient was in bed because  she was  severely constipated.  She was given magnesium citrate, with good  results.  The following day, she appeared to be ready for discharge.  She was experiencing some sinus pain and a sore throat, but she denied  suicidal ideation and felt that she was ready to go home.   AFTERCARE:  The patient was discharged with a plan to follow up at  [facility] with an appointment on January 05, 2007.   DISCHARGE MEDICATIONS:  1. Protonix 40 mg daily.  2. Requip 1 mg 9 a.m. and bedtime, and Requip 0.5 mg at 2 p.m.  3. Spiriva 18 mcg daily.  4. Klonopin 1 mg t.i.d.  5. Duragesic patch 75 mcg, change every 48 hours.  6. Paxil 10 mg daily.  7. Nasonex 2 sprays daily.  8. Lyrica 150 mg daily.   The patient was instructed to follow up with her usual medical physician  regarding medical issues.   DISCHARGE DIAGNOSES:  AXIS I:  Major depressive  disorder, recurrent.  Bereavement.  AXIS II:  Deferred.  AXIS III:  History of fibromyalgia, arthritis, gastroesophageal reflux  disease, chronic pain.  AXIS IV:  Stressors:  Severe.  AXIS V:  GAF on discharge 65.      Anselm Jungling, MD  Electronically Signed     SPB/MEDQ  D:  01/08/2007  T:  01/10/2007  Job:  [id number]

## 2011-05-16 NOTE — Consult Note (Signed)
[name], DALOIA NO.:  [id number]   MEDICAL RECORD NO.:  [id number]          PATIENT TYPE:  INP   LOCATION:  A309                          FACILITY:  [facility]   PHYSICIAN:  Dennie Maizes, M.D.   DATE OF BIRTH:  11/24/1961   DATE OF CONSULTATION:  04/15/2006  DATE OF DISCHARGE:                                   CONSULTATION   ATTENDING PHYSICIAN:  Elfredia Nevins, M.D.   CONSULTING PHYSICIANS:  Dennie Maizes, M.D.   REASON FOR CONSULTATION:  Intermittent hematuria, urinary frequency, pelvic  pain and interstitial cystitis.   HISTORY OF PRESENT ILLNESS:  This is a 50 year old female who has been  admitted to the hospital with intractable back pain radiating to the legs.  She has a history of a fall on the back resulting in hematoma of the lower  back area.  This all happened in February of 2006.  She has been evaluated  with an MRI of the lumbar spine, as well as thoracic spine.  No cause for  the intractable pain has been found so far.   She also has a history of chronic interstitial cystitis for the past 18  years.  She is under the care of Dr. Bjorn Pippin at [facility]  She has  undergone multiple cystoscopies, urethral dilations, hydrodistention of the  bladder with insulation of medications into the bladder.  The last  cystoscopy and hydrodistention was done in 2005.   The patient complains of trouble with some urinary frequency x15-20,  nocturia x10-15, suprapubic discomfort and pelvic pain.  She did not have  any voiding difficulty.  She has pain with a distended bladder and she has  relief of pain after voiding.  She also has complaints of dysuria.  She has  noticed a mild intermittent hematuria recently.  She has a remote history of  having kidney stones.  She has not had any urinary tract infections  recently.   PAST MEDICAL HISTORY:  History of chronic interstitial cystitis, bronchial  asthma, mitral valve prolapse, depression, endometriosis,  fibromyalgia,  remote arthritis and irritable bowel syndrome status post laparoscopic  assisted vaginal hysterectomy, status post excision of right ovarian cyst,  multiple laparoscopies for diagnosis and treatment of endometriosis, and  pelvic pain.  Status post tonsillectomy.  Status post multiple cystoscopies  urethral dilations and hydrodistentions of the bladder.   ALLERGIES:  SHE IS ALLERGIC TO STADOL, PHENERGAN, COMPAZINE, TIGAN,  TOFRANIL, VIOXX, MORPHINE, DEMEROL, ALL DECONGESTANTS.   EXAMINATION:  ABDOMEN:  Soft.  No palpable flank mass.  No CVA tenderness.  Bladder is not palpable.  No suprapubic tenderness.   ADMISSION LABORATORY:  BUN 3 and creatinine 0.8.  Electrolytes:  Sodium 132,  potassium 3.4, chloride 101 and CO2 27.  CBC:  WBC 9.1, hemoglobin 12.8 and  hematocrit 36.7.  Urinalysis:  Blood large, RBCs 7-10 per high-powered field  and bacteria rare.   IMPRESSION:  1.  Chronic interstitial cystitis.  2.  Microscopic hematuria.   The patient has been evaluatedand had a CT scan of the abdomen and pelvis.  The kidneys are normal.  There is no evidence of any renal mass,  hydronephrosis or calculus.  There is no evidence of any urethral or bladder  calculi.   PLAN:  1.  Urine cytology to evaluate the microhematuria.  2.  The patient is being evaluated for intractable back pain.  Once the      evaluation is complete, we will schedule her to have cystoscopy,      bilateral ureter pyelograms, hydrodistention of the bladder and      anesthesia as an outpatient.   Thanks for this consult.      Dennie Maizes, M.D.  Electronically Signed     SK/MEDQ  D:  04/15/2006  T:  04/16/2006  Job:  [id number]

## 2011-05-16 NOTE — Assessment & Plan Note (Signed)
[facility]                               PULMONARY OFFICE NOTE   NAME:[name], [name]                      MRN:          [id number]  DATE:10/30/2006                            DOB:          November 05, 1961    PROBLEM:  A 50 year old woman seen in pulmonary consultation at the kind  request of Dr. Sherwood Gambler for complaint of asthma and wheezing.   HISTORY:  She gives a complex history, saying she was diagnosed with asthma  in 1994, and that she has been wheezing all of the time for at least that  long.  It is made worse by exercise, house cleaning.  She started smoking  about 3 years ago, and says she averages about one-half pack a day.  Smoke  causes productive cough, and she has seen some streaks of blood, apparently  just in the last month or so.  She had a chest x-ray at [facility] 1 month  ago, showed nothing specific.  She was treated with a series of antibiotics,  including Biaxin, Zithromax and then Levaquin and she complains this has  made her constipated, and she has lost 20 pounds.  She complains of  excessive thirst, and feels that she stays hoarse, short of breath and  with diffuse chest pains, which are generally not positional or exertional.  She has a fiance who wears CPAP.  She had a sleep study, which found  nocturnal oxygen desaturations, but no loud snoring, and she did not meet  diagnostic criteria for treatable sleep apnea.   MEDICATIONS:  1. Prozac 60 mg.  2. Klonopin 0.5 mg.  3. Requip 2 mg.  4. Hydrochlorothiazide.  5. Fentanyl patch 50 mcg p.r.n. for breakthrough pain.  6. Oxycodone 15 mg every 8 hours.  7. Calcium with vitamin D.  8. She has a home nebulizer with Albuterol.   Drug intolerant to Tigan, Compazine, Phenergan, Stadol, Biaxin and  prednisone.   REVIEW OF SYMPTOMS:  She says she hurts allover and has an appointment with  a rheumatologist.  Feels hoarse.  Recognizes some reflux.  Feels exhausted,  but not sleepy.   She complains of dyspnea with activity and at rest,  productive and nonproductive cough, streak hemoptysis, diffuse nonspecific  chest pain,  palpitations, acid indigestion, anorexia, headaches, nasal  congestion, anxiety and depression.  She reports fibromyalgia with severe  pains interfering with nocturnal rest, severe facial pains and joint pains.   PAST MEDICAL HISTORY:  1. Fibromyalgia.  2. Irritable bowel syndrome with no gastroenterologist.  3. Possible angina.  4. Asthma.  5. Allergies and sinus trouble.  6. Chronic headaches.  7. Chronic obstructive pulmonary disease with pneumonia, treated a month      ago.  8. Interstitial cystitis with 52 bladder dilatations.  9. Question of mitral valve prolapse.   SURGERIES:  1. Gallbladder.  2. Hysterectomy.   She reports having 2 L of oxygen at night for slight apnea.  She reports  having taken prednisone in the past, and says it makes her adrenal glands  shut down.  She has had cortisol  injections.  She was evaluated by an  endocrinologist in [place], told that her adrenal glands were asleep and by  staying off of cortisone, they would come back.   SOCIAL HISTORY:  She started smoking 3 years ago.  Is currently not married.  Lives with her finance.  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   FAMILY HISTORY:  Father is a significant alcoholic according to her.  There  is significant family history of asthma and chronic obstructive pulmonary  disease, rheumatism, cancer and heart disease.   OBJECTIVE:  VITAL SIGNS:  Weight 143 pounds.  BP 104/64.  Pulse regular at  85.  Room air saturation 96%.  GENERAL:  Depressed affect.  SKIN:  No rash.  ADENOPATHY:  None found.  HEENT:  Nose and throat are clear.  No neck vein distention.  No thyromegaly.  CHEST:  Transient rhonchi heard right mid lateral chest, cleared with deep  breath.  HEART:   Sounds regular without murmur or gallop.  No hepatosplenomegaly palpable.  EXTREMITIES:  Without cyanosis, clubbing or edema.  LABORATORY:  An overnight oximetry obtained July 22, 2006 recorded 1.7% of  the night with saturation less than 88%.  Lab work drawn October 28, 2006  and available in time for dictation includes white blood count total of  11.6, hemoglobin 12.6, platelet count 422,000.  7.9% eosinophils.  Chemistry  profile significant for sodium 138, potassium 3.7, chloride 101, carbon  dioxide 31, glucose 108, BUN 5, creatinine 0.8, total protein 6.2.  Liver  enzymes normal.  Serum cortisol returns at 1.7.   IMPRESSION:  1. Bronchitis after recent pneumonia with slight oxygen desaturation      during sleep.  2. Self-reported chronic obstructive pulmonary disease, having smoked for      only 3 years.  We need more objective clarification.  3. Depression.  4. Generalized malaise.  5. Constipation.  6. Diffuse pain, nonspecific, probably related to depression, but possibly      also reflecting her serum cortisol level.  7. Adrenal insufficiency.  It is not clear how much cortisone she has had      and whether this is secondary or primary, but it needs early treatment.      I have contacted her about this.  She said she had low cortisol as far      back as a year ago, and was treated by an endocrinologist last year in      [place], who told her that she did not need medication, her adrenal      function would return.  I have no detail.  We will try to get her in as      soon as possible with an endocrinologist here.  Her blood pressure      appears stable, and electrolytes are stable.  This is not an acute      situation.  8. Tobacco use.   PLAN:  1. We are steering her towards the [facility].  2. We are arranging endocrine consultation for adrenal insufficiency.     Hopefully, that can be done soon enough that they can see her before      she is  treated, otherwise I will need to start her on some replacement      therapy, pending that appointment, and hopefully many of her      symptomatic complaints can resolve with that intervention.  3. CT  scan of chest for evaluation of hemoptysis, which was difficult to      quantitate in talking with her.  4. Sample Veramyst for nasal congestion, 1 spray each nostril daily.  5. Sample Symbicort 160/4.5 two puffs b.i.d. rinse in mouth.  6. Schedule return in 1 month, earlier p.r.n.     Clinton D. Maple Hudson, MD, Tonny Bollman, FACP  Electronically Signed    CDY/MedQ  DD: 10/30/2006  DT: 10/31/2006  Job #: [id number]   cc:   [name], MD  Dorisann Frames, M.D.
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
## 2011-05-16 NOTE — Assessment & Plan Note (Signed)
MEDICAL RECORD NUMBER:  [id number]   [name] is here in follow up of her likely fibromyalgia syndrome. She has  had a lot of pain since we last met, particularly in her knees and hands.  The knees are painful with bending and prolonged walking. She rates her pain  at a 6-7/10. She had good results with the trigger point injections in the  neck and shoulders and pain is much improved there. Flexeril has helped  somewhat with her sleep but she is still having problems with a good night's  rest. She is using 20 mg at this point at bedtime. She has had her labs  drawn today that we requested last visit. She continues on Neurontin 600 mg  t.i.d. She took her last Neurontin pill this morning. She found the Lidoderm  patches were not helpful. Another big complaint of hers today is significant  pain in hands including the thumb and the second two fingers. She was  diagnosed electrodiagnostically with carpal tunnel syndrome approximately 4  years ago.   The patient describes her pain as sharp, burning, dull, constant, aching,  and tingling. It interferes with general activity, relations with others,  and enjoyment of life on a severe level. Pain increases with most activity.  She can walk about 20-30 minutes at a time. She tries to walk on a regular  basis. She was started on Prozac by the local mental health group.   REVIEW OF SYSTEMS:  The patient reports weakness, numbness, tingling,  spasms, dizziness, depression, confusion, anxiety, fever, chills, weight  gain, night sweats, swelling, nausea, vomiting, diarrhea, abdominal pain,  appetite problems, urinary retention, coughing, wheezing, shortness of  breath. She recently had to go to the emergency room and was diagnosed with  pneumonia and is currently on a course of Levaquin.   SOCIAL HISTORY:  The patient is divorced and living alone. She smokes a half  pack of cigarettes per day.   PHYSICAL EXAMINATION:  Blood pressure is 109/71,  pulse is 102, respiratory  rate 16, she is saturating 97% in room air. The patient is anxious but in no  acute distress. Heart has regular rate and rhythm, lungs are clear, abdomen  soft and nontender. The patient is alert and oriented to place. Gait is  antalgic bilaterally. Coordination is 2+. Reflexes are 2+. Sensation is  generally within normal limits except for the hands, which appear to have  some decrease in the median nerve distribution. Tinel's test and Phalen's  test were positive in both hands. OK sign was positive on both hands. The  patient had fair neck range of motion today. She was a bit restricted in the  low back with flexion and extension. Knees were both swollen, right greater  than left, with pain on palpation of the peripatellar region. She had pain  with resisted flexion and extension today.   ASSESSMENT:  1.  Likely fibromyalgia syndrome with concomitant interstitial cystitis,      irritable bowel syndrome, migraine headaches, temporomandibular joint      pain, restless legs, depression, insomnia, and myofascial pain.  2.  Carpal tunnel syndrome.  3.  Osteoarthritis of the knees.   PLAN:  1.  Await laboratory testing from today. Consider follow-up rheumatologic      lab work.  2.  Observe progress with the Prozac. I did refill the patient's Klonopin      0.5 mg q.12h.  3.  Will change the patient from Neurontin to Lyrica. Begin at 40  mg t.i.d.      and increase to 100 mg t.i.d. after essentially a week to 10 days time.  4.  After informed consent we injected both knees with 40 mg of Kenalog and      3 mL of 1% lidocaine. The patient tolerated these well.  5.  Encouraged exercise as tolerated, as well as proper sleep and appetite.  6.  We switched the patient from Vicodin to Percocet 10/650 one q.6h. p.r.n.      for breakthrough symptoms.  7.  I will see the patient back in about 1 month time.      Ranelle Oyster, M.D.  Electronically Signed      ZTS/MedQ  D:  01/13/2006 13:38:04  T:  01/13/2006 15:38:43  Job #:  [id number]   cc:   [name], MD  Fax: [phone]

## 2011-05-16 NOTE — H&P (Signed)
NAMEDUNG, PRIEN NO.:  [id number]   MEDICAL RECORD NO.:  [id number]          PATIENT TYPE:  IPS   LOCATION:  0506                          FACILITY:  [facility]   PHYSICIAN:  Geoffery Lyons, M.D.      DATE OF BIRTH:  1961-07-03   DATE OF ADMISSION:  12/05/2006  DATE OF DISCHARGE:                       PSYCHIATRIC ADMISSION ASSESSMENT   This is a voluntary admission to the services of Dr. Geoffery Lyons.   IDENTIFYING INFORMATION:  This is a 50 year old divorced white female.  She presented to the emergency department at [facility] yesterday  around 4 p.m.  She came in with a chief complaint of depression and  pain.  She reported that her mother had died unexpectedly in Nov 12, 2023,  and she had to performed CPR, but wonders if she could have done more to  save her mother.  She states she has been thinking about hurting  herself, has no real plan, and has not harmed herself to this point.  She was also complaining of pain all over from her fibromyalgia.  She  uses fentanyl patches and oxycodone to manage the pain at home.  She  rated her pain in the ED as a 9 out of 10.  She stated that she had  taken 4 methocarbamol muscle relaxers the night before last, thinking  that she might not wake up.  She stated that she had had suicidal  ideation earlier in the day prior to admission, and she stated that she  has overdosed before 2 times.  Trigger for past attempts were childhood  issues, marital issues, and she has had treatment with antidepressants  on and off for almost 25 yeas.  She has had no prior inpatient.  Her  outpatient care has not been with a psychiatrist, it has been with  family practice people.  She stated that she began taking any  antidepressants for postpartum depression after the birth of her  daughter, who is now 57.   SOCIAL HISTORY:  She graduated high school.  She later attended college  and was employed as an Museum/gallery exhibitions officer.  She has been married 4 times.  She is  currently engaged.  She has 1 daughter, 53.  Her daughter is married,  and she has a 69-year-old grandson.  She has not worked in 2 years.  Her  fiance supports her.  She states her depression and fibromyalgia prevent  her from being employable.   FAMILY HISTORY:  She states her whole family are alcoholics.   ALCOHOL AND DRUG HISTORY:  She smokes 1-1/2 packs of cigarettes per day.  She denies any other alcohol or drug use.  Her UDS was positive for  opiates and benzodiazepines, however, she is prescribed.  Her alcohol  level was less than 5.   PRIMARY CARE Kennedie Pardoe:  She states that she will be going to a new one,  a Dr. Felecia Shelling, and Dr. __________ in [place] is her pain doctor.   MEDICAL PROBLEMS:  She has fibromyalgia.  She has nerve damage in her  back, a bulging disk, sciatica, rheumatoid arthritis, and interstitial  cystitis.  She states she is under Dr. __________ care for the  interstitial cystitis.   MEDICATIONS:  She is currently prescribed Prozac 40 mg p.o. daily.  She  last picked that up on November 24, however, she states that her primary  care Maisha Bogen has bumped her up to 60 mg a day.  Klonopin 1 mg p.o.  t.i.d., last picked up on November 14.  Methocarbamol with aspirin 750  mg p.o. b.i.d., last picked up October 3.  Albuterol inhaler p.o. p.r.n.  Spiriva p.o. q.a.m.  O2 2 L by nasal cannula at h.s.  Requip 2 mg one-  half in the a.m. and one-half at h.s.  Fentanyl patch 75 mcg every 2  days, last prescribed November 14.  Oxycodone 15 mg p.o. q.6 h p.r.n.  breakthrough pain, last picked up November 15.  Hydrochlorothiazide 25  mg p.o. daily, and Zantac 150 mg p.o. daily.   DRUG ALLERGIES:  COMPAZINE, PHENERGAN, AND TIGAN.  SHE FEELS EDGY.  ALL  DECONGESTANTS BREAK HER OUT, AND TOFRANIL GAVE HER A RASH AND  TACHYCARDIA.   POSITIVE PHYSICAL FINDINGS:  She was medically cleared in the ED at  [facility]  As already stated, her UDS was positive for  opiates  and benzodiazepines, however, she is prescribed.  Her alcohol  level was less than 5, and her labs only had an elevation of carbon  dioxide at 33.  The normal range is 19 to 32.  Her other labs are  currently pending.  Her vital signs on admission to our unit show that  she is 5 feet 2, weighs 142, temperature was 97, blood pressure is  113/73 to 96/65, pulse is 65 to 80, and respirations are 20.  She is  status post a hysterectomy, a gallbladder and bladder dilation.  Also, 3  laparoscopies, and she has no other remarkable physical findings.   MENTAL STATUS EXAM:  Today she is alert and oriented x3.  Her appearance  is casual.  She is casually groomed and dressed.  She is adequately  nourished.  She moves as if she in pain.  Her speech is not pressured.  Her mood is depressed and anxious.  Her affect is congruent.  Her  thought processes are clear, rational, and goal-oriented.  Judgment/insight are intact.  Concentration and memory are good.  Intelligence is at least average.  She is not actively suicidal,  although she is still not sure if she wants to wake up.  She is not  homicidal, and she denies auditory/visual hallucinations.   DIAGNOSES:  Axis I:  Major depressive disorder without psychotic  features, grief reaction.  Axis II:  Personality disorder not otherwise specified.  Axis III:  Fibromyalgia, interstitial cystitis, rheumatoid arthritis,  and bulging disk, sciatica, and nerve damage in her back.  Axis IV:  Severe.  Problems with primary support group, occupational,  housing, and economic issues.  Axis V:  20.   PLAN:  Admit for safety and stabilization, to adjust her meds.  Towards  that end, maybe we should add a small amount of Zyprexa 2.5 or 5 mg at  h.s., and she needs to go to grief counseling hospice.  Probably DBT  would not hurt either.      Mickie Leonarda Salon, P.A.-C.     Geoffery Lyons, M.D.  Electronically Signed    MD/MEDQ  D:  12/05/2006  T:   12/05/2006  Job:  [id number]

## 2011-06-13 ENCOUNTER — Emergency Department (HOSPITAL_COMMUNITY): Payer: Medicare Other

## 2011-06-13 ENCOUNTER — Emergency Department (HOSPITAL_COMMUNITY)
Admission: EM | Admit: 2011-06-13 | Discharge: 2011-06-13 | Disposition: A | Discharge status: Home / Self Care | Payer: Medicare Other | Attending: Emergency Medicine | Admitting: Emergency Medicine

## 2011-06-13 DIAGNOSIS — K5730 Diverticulosis of large intestine without perforation or abscess without bleeding: Secondary | ICD-10-CM | POA: Insufficient documentation

## 2011-06-13 DIAGNOSIS — F329 Major depressive disorder, single episode, unspecified: Secondary | ICD-10-CM | POA: Insufficient documentation

## 2011-06-13 DIAGNOSIS — IMO0001 Reserved for inherently not codable concepts without codable children: Secondary | ICD-10-CM | POA: Insufficient documentation

## 2011-06-13 DIAGNOSIS — M069 Rheumatoid arthritis, unspecified: Secondary | ICD-10-CM | POA: Insufficient documentation

## 2011-06-13 DIAGNOSIS — F3289 Other specified depressive episodes: Secondary | ICD-10-CM | POA: Insufficient documentation

## 2011-06-13 DIAGNOSIS — J4489 Other specified chronic obstructive pulmonary disease: Secondary | ICD-10-CM | POA: Insufficient documentation

## 2011-06-13 DIAGNOSIS — R059 Cough, unspecified: Secondary | ICD-10-CM | POA: Insufficient documentation

## 2011-06-13 DIAGNOSIS — Z79899 Other long term (current) drug therapy: Secondary | ICD-10-CM | POA: Insufficient documentation

## 2011-06-13 DIAGNOSIS — R5381 Other malaise: Secondary | ICD-10-CM | POA: Insufficient documentation

## 2011-06-13 DIAGNOSIS — J449 Chronic obstructive pulmonary disease, unspecified: Secondary | ICD-10-CM | POA: Insufficient documentation

## 2011-06-13 DIAGNOSIS — G2581 Restless legs syndrome: Secondary | ICD-10-CM | POA: Insufficient documentation

## 2011-06-13 DIAGNOSIS — R05 Cough: Secondary | ICD-10-CM | POA: Insufficient documentation

## 2011-06-13 DIAGNOSIS — F4310 Post-traumatic stress disorder, unspecified: Secondary | ICD-10-CM | POA: Insufficient documentation

## 2011-06-13 DIAGNOSIS — M1990 Unspecified osteoarthritis, unspecified site: Secondary | ICD-10-CM | POA: Insufficient documentation

## 2011-06-13 DIAGNOSIS — R51 Headache: Secondary | ICD-10-CM | POA: Insufficient documentation

## 2011-06-13 LAB — URINALYSIS, ROUTINE W REFLEX MICROSCOPIC
Leukocytes, UA: NEGATIVE
Nitrite: NEGATIVE
Protein, ur: NEGATIVE mg/dL
Urobilinogen, UA: 0.2 mg/dL (ref 0.0–1.0)

## 2011-06-13 LAB — DIFFERENTIAL
Basophils Relative: 0 % (ref 0–1)
Eosinophils Absolute: 0.2 10*3/uL (ref 0.0–0.7)
Monocytes Relative: 7 % (ref 3–12)
Neutrophils Relative %: 53 % (ref 43–77)

## 2011-06-13 LAB — CBC
MCH: 31.2 pg (ref 26.0–34.0)
MCHC: 35.1 g/dL (ref 30.0–36.0)
Platelets: 289 10*3/uL (ref 150–400)
RBC: 3.91 MIL/uL (ref 3.87–5.11)

## 2011-06-13 LAB — BASIC METABOLIC PANEL
CO2: 28 mEq/L (ref 19–32)
Calcium: 9.6 mg/dL (ref 8.4–10.5)
GFR calc non Af Amer: >60 mL/min (ref >60)
Sodium: 139 mEq/L (ref 135–145)

## 2011-07-02 ENCOUNTER — Emergency Department (HOSPITAL_COMMUNITY)
Admission: EM | Admit: 2011-07-02 | Discharge: 2011-07-03 | Disposition: A | Discharge status: Home / Self Care | Payer: Medicare Other | Attending: Emergency Medicine | Admitting: Emergency Medicine

## 2011-07-02 ENCOUNTER — Emergency Department (HOSPITAL_COMMUNITY): Payer: Medicare Other

## 2011-07-02 DIAGNOSIS — F3289 Other specified depressive episodes: Secondary | ICD-10-CM | POA: Insufficient documentation

## 2011-07-02 DIAGNOSIS — M1990 Unspecified osteoarthritis, unspecified site: Secondary | ICD-10-CM | POA: Insufficient documentation

## 2011-07-02 DIAGNOSIS — F329 Major depressive disorder, single episode, unspecified: Secondary | ICD-10-CM | POA: Insufficient documentation

## 2011-07-02 DIAGNOSIS — F17200 Nicotine dependence, unspecified, uncomplicated: Secondary | ICD-10-CM | POA: Insufficient documentation

## 2011-07-02 DIAGNOSIS — J45909 Unspecified asthma, uncomplicated: Secondary | ICD-10-CM | POA: Insufficient documentation

## 2011-07-02 DIAGNOSIS — J069 Acute upper respiratory infection, unspecified: Secondary | ICD-10-CM | POA: Insufficient documentation

## 2011-07-02 DIAGNOSIS — IMO0001 Reserved for inherently not codable concepts without codable children: Secondary | ICD-10-CM | POA: Insufficient documentation

## 2011-07-02 DIAGNOSIS — G40909 Epilepsy, unspecified, not intractable, without status epilepticus: Secondary | ICD-10-CM | POA: Insufficient documentation

## 2011-07-02 DIAGNOSIS — M069 Rheumatoid arthritis, unspecified: Secondary | ICD-10-CM | POA: Insufficient documentation

## 2011-08-28 ENCOUNTER — Emergency Department (HOSPITAL_COMMUNITY)
Admission: EM | Admit: 2011-08-28 | Discharge: 2011-08-28 | Disposition: A | Discharge status: Home / Self Care | Payer: Medicare Other | Attending: Emergency Medicine | Admitting: Emergency Medicine

## 2011-08-28 ENCOUNTER — Encounter: Payer: Self-pay | Admitting: Emergency Medicine

## 2011-08-28 ENCOUNTER — Emergency Department (HOSPITAL_COMMUNITY): Payer: Medicare Other

## 2011-08-28 DIAGNOSIS — Z79899 Other long term (current) drug therapy: Secondary | ICD-10-CM | POA: Insufficient documentation

## 2011-08-28 DIAGNOSIS — J449 Chronic obstructive pulmonary disease, unspecified: Secondary | ICD-10-CM | POA: Insufficient documentation

## 2011-08-28 DIAGNOSIS — R05 Cough: Secondary | ICD-10-CM

## 2011-08-28 DIAGNOSIS — R112 Nausea with vomiting, unspecified: Secondary | ICD-10-CM | POA: Insufficient documentation

## 2011-08-28 DIAGNOSIS — J3489 Other specified disorders of nose and nasal sinuses: Secondary | ICD-10-CM | POA: Insufficient documentation

## 2011-08-28 DIAGNOSIS — R0602 Shortness of breath: Secondary | ICD-10-CM | POA: Insufficient documentation

## 2011-08-28 DIAGNOSIS — R509 Fever, unspecified: Secondary | ICD-10-CM | POA: Insufficient documentation

## 2011-08-28 DIAGNOSIS — IMO0001 Reserved for inherently not codable concepts without codable children: Secondary | ICD-10-CM | POA: Insufficient documentation

## 2011-08-28 DIAGNOSIS — Z8739 Personal history of other diseases of the musculoskeletal system and connective tissue: Secondary | ICD-10-CM | POA: Insufficient documentation

## 2011-08-28 DIAGNOSIS — J4489 Other specified chronic obstructive pulmonary disease: Secondary | ICD-10-CM | POA: Insufficient documentation

## 2011-08-28 DIAGNOSIS — R079 Chest pain, unspecified: Secondary | ICD-10-CM | POA: Insufficient documentation

## 2011-08-28 DIAGNOSIS — R059 Cough, unspecified: Secondary | ICD-10-CM | POA: Insufficient documentation

## 2011-08-28 HISTORY — DX: Fibromyalgia: M79.7

## 2011-08-28 HISTORY — DX: Chronic obstructive pulmonary disease, unspecified: J44.9

## 2011-08-28 HISTORY — DX: Bronchitis, not specified as acute or chronic: J40

## 2011-08-28 HISTORY — DX: Unspecified osteoarthritis, unspecified site: M19.90

## 2011-08-28 HISTORY — DX: Interstitial cystitis (chronic) without hematuria: N30.10

## 2011-08-28 LAB — URINALYSIS, ROUTINE W REFLEX MICROSCOPIC
Ketones, ur: NEGATIVE mg/dL
Nitrite: NEGATIVE
Specific Gravity, Urine: 1.015 (ref 1.005–1.030)
Urobilinogen, UA: 0.2 mg/dL (ref 0.0–1.0)
pH: 5.5 (ref 5.0–8.0)

## 2011-08-28 LAB — BASIC METABOLIC PANEL
Chloride: 100 mEq/L (ref 96–112)
GFR calc Af Amer: >60 mL/min (ref >60)
Potassium: 4.1 mEq/L (ref 3.5–5.1)

## 2011-08-28 LAB — SEDIMENTATION RATE: Sed Rate: 9 mm/hr (ref 0–22)

## 2011-08-28 LAB — CBC
Platelets: 281 10*3/uL (ref 150–400)
RDW: 12.6 % (ref 11.5–15.5)
WBC: 8.1 10*3/uL (ref 4.0–10.5)

## 2011-08-28 LAB — URINE MICROSCOPIC-ADD ON

## 2011-08-28 MED ORDER — BENZONATATE 100 MG PO CAPS: 200.0000 mg | ORAL_CAPSULE | Freq: Two times a day (BID) | ORAL | Status: AC | PRN

## 2011-08-28 MED ORDER — HYDROMORPHONE HCL 1 MG/ML IJ SOLN
1.0000 mg | Freq: Once | INTRAMUSCULAR | Status: AC
  Administered 2011-08-28: 1 mg via INTRAMUSCULAR
  Filled 2011-08-28: qty 1

## 2011-08-28 NOTE — ED Notes (Signed)
Pt co increasing pain, pt states pain is 9/10 and her head now is throbbing. Pt "wants dilaudid and benadryl."  Dr Hyacinth Meeker notified.

## 2011-08-28 NOTE — ED Notes (Signed)
Pt states has had cold like symptoms ; cough congestion, body aches, and generalized pain x 1 month.  States her hair has been falling out and throat is sore "like lymph node gland sore".

## 2011-08-28 NOTE — ED Provider Notes (Signed)
History     CSN: [id number] Arrival date & time: 08/28/2011  5:29 PM  Chief Complaint  Patient presents with  . Cough  . Nasal Congestion  . Fever  . Nausea  . Emesis   HPI Comments: Sent to ER by Dr. Juanetta Gosling for CXR to r/o pna as she has hx of COPD and is currently not responding to meds for cough.  Patient is a 50 y.o. female presenting with cough, fever, and vomiting. The history is provided by the patient. No language interpreter was used.  Cough Chronicity: last 4 weeks. Episode onset: last 4 months. The problem occurs constantly. The problem has not changed since onset.Cough characteristics: non productive cough. Maximum temperature: subjective fever. Associated symptoms include headaches, sore throat, shortness of breath and wheezing. Pertinent negatives include no chest pain, no chills, no sweats, no weight loss, no ear pain, no myalgias and no eye redness. Associated symptoms comments: myalgias. Treatments tried: albuterol MDI, and albuterol nebs and antibiotics including Amox, Zithromax and Levaquin (most recently Felicity Coyer) The treatment provided mild relief.  Fever Primary symptoms of the febrile illness include fever, headaches, cough, wheezing, shortness of breath and vomiting. Primary symptoms do not include myalgias.  Emesis  Associated symptoms include cough, a fever and headaches. Pertinent negatives include no chills, no myalgias and no sweats.    Past Medical History  Diagnosis Date  . Arthritis   . COPD (chronic obstructive pulmonary disease)   . Bronchitis   . Fibromyalgia   . Interstitial cystitis     Past Surgical History  Procedure Date  . Abdominal hysterectomy     Family History  Problem Relation Age of Onset  . Diabetes Mother   . Cancer Mother   . Cancer Father   . Diabetes Father     History  Substance Use Topics  . Smoking status: Current Everyday Smoker -- 0.5 packs/day  . Smokeless tobacco: Not on file  . Alcohol Use: No    OB History    Grav Para Term Preterm Abortions TAB SAB Ect Mult Living   1 1 1       1       Review of Systems  Constitutional: Positive for fever. Negative for chills and weight loss.  HENT: Positive for sore throat. Negative for ear pain.   Eyes: Negative for redness.  Respiratory: Positive for cough, shortness of breath and wheezing.   Cardiovascular: Negative for chest pain.  Gastrointestinal: Positive for vomiting.  Musculoskeletal: Negative for myalgias.  Neurological: Positive for headaches.  All other systems reviewed and are negative.    Physical Exam  BP 154/98  Pulse 76  Temp 98.8 F (37.1 C)  Resp 22  Ht 5\' 2"  (1.575 m)  Wt 156 lb (70.761 kg)  BMI 28.53 kg/m2  SpO2 98%  LMP 12/29/1993  Physical Exam  Nursing note and vitals reviewed. Constitutional: She appears well-developed and well-nourished. No distress.  HENT:  Head: Normocephalic and atraumatic.  Mouth/Throat: Oropharynx is clear and moist. No oropharyngeal exudate.  Eyes: Conjunctivae and EOM are normal. Pupils are equal, round, and reactive to light. Right eye exhibits no discharge. Left eye exhibits no discharge. No scleral icterus.  Neck: Normal range of motion. Neck supple. No JVD present. No thyromegaly present.  Cardiovascular: Normal rate, regular rhythm, normal heart sounds and intact distal pulses.  Exam reveals no gallop and no friction rub.   No murmur heard. Pulmonary/Chest: Effort normal. No respiratory distress. She has wheezes. She has no rales.  Mild diffuse end exp wheezing  Abdominal: Soft. Bowel sounds are normal. She exhibits no distension and no mass. There is no tenderness.  Musculoskeletal: Normal range of motion. She exhibits no edema and no tenderness.  Lymphadenopathy:    She has no cervical adenopathy.  Neurological: She is alert. Coordination normal.  Skin: Skin is warm and dry. No rash noted. No erythema.  Psychiatric: She has a normal mood and affect. Her behavior is normal.     ED Course  Procedures  MDM Overall the patient appears very well with normal vital signs including temperature, blood pressure, pulse and oxygen saturation. She has mild end expiratory wheezing which is not surprising given her history of COPD. She is in no distress. She does have fibromyalgia per her report. Will initiate workup with chest x-ray and labs to rule out any other source of her feeling. We'll also give IM medications for pain.    X-ray and laboratory data reveal no specific source for the patient's symptoms. She improved slightly with Dilantin. Has some component of chronic pain and is in the care of Dr. Juanetta Gosling who is a respiratory specialist and admitted. We'll discharge him for followup with her primary doctor.  Results for orders placed during the hospital encounter of 08/28/11  CBC      Component Value Range   WBC 8.1  4.0 - 10.5 (K/uL)   RBC 4.31  3.87 - 5.11 (MIL/uL)   Hemoglobin 13.6  12.0 - 15.0 (g/dL)   HCT 16.1  09.6 - 04.5 (%)   MCV 90.7  78.0 - 100.0 (fL)   MCH 31.6  26.0 - 34.0 (pg)   MCHC 34.8  30.0 - 36.0 (g/dL)   RDW 40.9  81.1 - 91.4 (%)   Platelets 281  150 - 400 (K/uL)  BASIC METABOLIC PANEL      Component Value Range   Sodium 136  135 - 145 (mEq/L)   Potassium 4.1  3.5 - 5.1 (mEq/L)   Chloride 100  96 - 112 (mEq/L)   CO2 29  19 - 32 (mEq/L)   Glucose, Bld 98  70 - 99 (mg/dL)   BUN 8  6 - 23 (mg/dL)   Creatinine, Ser 7.82  0.50 - 1.10 (mg/dL)   Calcium 9.2  8.4 - 95.6 (mg/dL)   GFR calc non Af Amer >60  >60 (mL/min)   GFR calc Af Amer >60  >60 (mL/min)  URINALYSIS, ROUTINE W REFLEX MICROSCOPIC      Component Value Range   Color, Urine STRAW (*) YELLOW    Appearance CLEAR  CLEAR    Specific Gravity, Urine 1.015  1.005 - 1.030    pH 5.5  5.0 - 8.0    Glucose, UA NEGATIVE  NEGATIVE (mg/dL)   Hgb urine dipstick LARGE (*) NEGATIVE    Bilirubin Urine NEGATIVE  NEGATIVE    Ketones, ur NEGATIVE  NEGATIVE (mg/dL)   Protein, ur NEGATIVE   NEGATIVE (mg/dL)   Urobilinogen, UA 0.2  0.0 - 1.0 (mg/dL)   Nitrite NEGATIVE  NEGATIVE    Leukocytes, UA NEGATIVE  NEGATIVE   SEDIMENTATION RATE      Component Value Range   Sed Rate 9  0 - 22 (mm/hr)  URINE MICROSCOPIC-ADD ON      Component Value Range   Squamous Epithelial / LPF MANY (*) RARE    WBC, UA 0-2  <3 (WBC/hpf)   RBC / HPF 21-50  <3 (RBC/hpf)   Bacteria, UA FEW (*) RARE  Dg Chest 2 View  08/28/2011  *RADIOLOGY REPORT*  Clinical Data: Shortness of breath and cough, chest pain  CHEST - 2 VIEW  Comparison: 07/02/2011  Findings: Cardiomediastinal silhouette is within normal limits. The lungs are clear. No pleural effusion.  No pneumothorax.  No acute osseous abnormality.  Cholecystectomy clips noted.  IMPRESSION: Normal chest.  Original Report Authenticated By: Harrel Lemon, M.D.      Vida Roller, MD 08/28/11 2003

## 2011-08-28 NOTE — ED Notes (Signed)
MD at bedside. Dr Hyacinth Meeker in to speak with pt.

## 2011-08-28 NOTE — ED Notes (Signed)
Pt c/o cough/congestion/fever/body aches/n/v x 1 month.

## 2011-08-28 NOTE — ED Notes (Signed)
Pt in restroom, walked unassisted without difficulty. Explained urine sample needed . With instructions on how to obtain correctly.

## 2011-09-22 LAB — BASIC METABOLIC PANEL
GFR calc non Af Amer: >60
Potassium: 3.7
Sodium: 138

## 2011-09-22 LAB — URINALYSIS, ROUTINE W REFLEX MICROSCOPIC
Bilirubin Urine: NEGATIVE
Nitrite: NEGATIVE
Specific Gravity, Urine: 1.019
Urobilinogen, UA: 0.2

## 2011-09-22 LAB — CBC
Hemoglobin: 13.1
Platelets: 282
RDW: 12.8

## 2011-09-22 LAB — ETHANOL: Alcohol, Ethyl (B): <5

## 2011-09-22 LAB — DIFFERENTIAL
Basophils Absolute: 0.1
Lymphocytes Relative: 43
Monocytes Absolute: 0.6
Neutro Abs: 3.5
Neutrophils Relative %: 41 — ABNORMAL LOW

## 2011-09-22 LAB — RAPID URINE DRUG SCREEN, HOSP PERFORMED: Tetrahydrocannabinol: NOT DETECTED

## 2011-09-29 LAB — URINALYSIS, ROUTINE W REFLEX MICROSCOPIC
Nitrite: NEGATIVE
Specific Gravity, Urine: 1.025
pH: 5

## 2011-10-02 LAB — URINALYSIS, ROUTINE W REFLEX MICROSCOPIC
Bilirubin Urine: NEGATIVE
Glucose, UA: NEGATIVE mg/dL
Specific Gravity, Urine: 1.01 (ref 1.005–1.030)
pH: 6 (ref 5.0–8.0)

## 2011-10-02 LAB — CBC
HCT: 38.9 % (ref 36.0–46.0)
Hemoglobin: 13.4 g/dL (ref 12.0–15.0)
WBC: 11.3 10*3/uL — ABNORMAL HIGH (ref 4.0–10.5)

## 2011-10-02 LAB — DIFFERENTIAL
Eosinophils Relative: 5 % (ref 0–5)
Lymphocytes Relative: 31 % (ref 12–46)
Lymphs Abs: 3.5 10*3/uL (ref 0.7–4.0)
Monocytes Absolute: 0.7 10*3/uL (ref 0.1–1.0)
Neutro Abs: 6.6 10*3/uL (ref 1.7–7.7)

## 2011-10-02 LAB — BASIC METABOLIC PANEL
Calcium: 9 mg/dL (ref 8.4–10.5)
GFR calc Af Amer: >60 mL/min (ref >60)
GFR calc non Af Amer: >60 mL/min (ref >60)
Potassium: 3.7 mEq/L (ref 3.5–5.1)
Sodium: 138 mEq/L (ref 135–145)

## 2011-10-02 LAB — URINE MICROSCOPIC-ADD ON

## 2011-10-06 LAB — DIFFERENTIAL
Eosinophils Absolute: 0.2
Eosinophils Relative: 2
Lymphocytes Relative: 31
Lymphs Abs: 3
Monocytes Relative: 7
Neutrophils Relative %: 59

## 2011-10-06 LAB — BASIC METABOLIC PANEL
BUN: 8
Chloride: 101
GFR calc Af Amer: >60
GFR calc non Af Amer: >60
Potassium: 3.8

## 2011-10-06 LAB — CBC
HCT: 40
MCV: 89
Platelets: ADEQUATE
RBC: 4.49
WBC: 9.6

## 2011-10-06 LAB — POCT CARDIAC MARKERS
Myoglobin, poc: 76.6
Operator id: 106841
Troponin i, poc: <0.05

## 2011-10-09 LAB — COMPREHENSIVE METABOLIC PANEL
ALT: 16
AST: 21
Albumin: 3.9
CO2: 29
Calcium: 9.2
Chloride: 97
GFR calc Af Amer: >60
GFR calc non Af Amer: >60
Sodium: 135
Total Bilirubin: 0.4

## 2011-10-09 LAB — BASIC METABOLIC PANEL
BUN: 5 — ABNORMAL LOW
Chloride: 103
Creatinine, Ser: 0.79
Glucose, Bld: 92

## 2011-10-09 LAB — RAPID URINE DRUG SCREEN, HOSP PERFORMED
Amphetamines: NOT DETECTED
Cocaine: NOT DETECTED
Tetrahydrocannabinol: NOT DETECTED

## 2011-10-09 LAB — DIFFERENTIAL
Basophils Absolute: 0
Basophils Relative: 1
Eosinophils Absolute: 1.3 — ABNORMAL HIGH
Eosinophils Relative: 14 — ABNORMAL HIGH
Lymphocytes Relative: 37
Lymphs Abs: 4 — ABNORMAL HIGH
Lymphs Abs: 2.7
Monocytes Relative: 5
Neutro Abs: 5.3
Neutrophils Relative %: 49
Neutrophils Relative %: 51

## 2011-10-09 LAB — CBC
HCT: 39.4
HCT: 32.6 — ABNORMAL LOW
Hemoglobin: 13.7
MCHC: 34.8
MCHC: 34.7
MCV: 89.4
MCV: 89.4
Platelets: 284
RDW: 13.3
RDW: 13.6
WBC: 9.5

## 2011-10-09 LAB — POCT CARDIAC MARKERS
Operator id: 216461
Operator id: 217071
Troponin i, poc: <0.05

## 2011-10-09 LAB — D-DIMER, QUANTITATIVE: D-Dimer, Quant: 0.39

## 2011-10-09 LAB — URINE MICROSCOPIC-ADD ON

## 2011-10-09 LAB — URINALYSIS, ROUTINE W REFLEX MICROSCOPIC
Ketones, ur: NEGATIVE
Nitrite: NEGATIVE
Protein, ur: NEGATIVE

## 2011-10-13 LAB — BASIC METABOLIC PANEL
CO2: 29
Calcium: 9.2
GFR calc Af Amer: >60
Glucose, Bld: 96
Potassium: 4.6
Sodium: 140

## 2011-10-13 LAB — DIFFERENTIAL
Eosinophils Relative: 6 — ABNORMAL HIGH
Lymphocytes Relative: 20
Monocytes Absolute: 0.5
Monocytes Relative: 5
Neutro Abs: 7.1

## 2011-10-13 LAB — BLOOD GAS, ARTERIAL
Acid-Base Excess: 1.6
Bicarbonate: 25.5 — ABNORMAL HIGH
O2 Saturation: 90.1
TCO2: 22.7
pCO2 arterial: 39.8
pO2, Arterial: 51.8 — ABNORMAL LOW

## 2011-10-13 LAB — CBC
HCT: 39.3
Hemoglobin: 13.4
MCHC: 34.2
RBC: 4.38
RDW: 13.4

## 2011-10-16 LAB — DIFFERENTIAL
Basophils Absolute: 0
Eosinophils Relative: 8 — ABNORMAL HIGH
Lymphocytes Relative: 39
Lymphs Abs: 3.2
Neutro Abs: 4
Neutrophils Relative %: 48

## 2011-10-16 LAB — CARDIAC PANEL(CRET KIN+CKTOT+MB+TROPI)
CK, MB: 0.9
Relative Index: INVALID
Total CK: 89
Troponin I: 0.02

## 2011-10-16 LAB — BASIC METABOLIC PANEL
BUN: 5 — ABNORMAL LOW
Calcium: 8.6
Calcium: 8.9
Creatinine, Ser: 0.7
GFR calc Af Amer: >60
GFR calc non Af Amer: >60
GFR calc non Af Amer: >60
Potassium: 3.8
Sodium: 137

## 2011-10-16 LAB — LIPID PANEL
Cholesterol: 178
HDL: 29 — ABNORMAL LOW
LDL Cholesterol: 117 — ABNORMAL HIGH
Total CHOL/HDL Ratio: 6.1
Triglycerides: 160 — ABNORMAL HIGH

## 2011-10-16 LAB — POCT CARDIAC MARKERS
Myoglobin, poc: 43.6
Operator id: 286941
Troponin i, poc: <0.05

## 2011-10-16 LAB — CBC
HCT: 34.1 — ABNORMAL LOW
Platelets: 268
RDW: 13.5
WBC: 8.3

## 2011-11-12 LAB — CBC WITH DIFFERENTIAL/PLATELET
BUN: 10 mg/dL (ref 4–21)
Chloride: 105 mmol/L
Cholesterol: 192 mg/dL (ref 0–200)
Glucose: 84 mg/dL
HCT: 40 %
Potassium: 4.6 mmol/L
Sodium: 139 mmol/L (ref 137–147)
Triglycerides: 168

## 2011-11-25 ENCOUNTER — Ambulatory Visit: Payer: Medicare Other | Admitting: Urgent Care

## 2011-12-01 ENCOUNTER — Encounter: Payer: Self-pay | Admitting: Internal Medicine

## 2011-12-02 ENCOUNTER — Ambulatory Visit (INDEPENDENT_AMBULATORY_CARE_PROVIDER_SITE_OTHER): Payer: Medicare Other | Admitting: Urgent Care

## 2011-12-02 ENCOUNTER — Encounter: Payer: Self-pay | Admitting: Urgent Care

## 2011-12-02 VITALS — BP 120/80 | HR 75 | Temp 97.4°F | Ht 61.0 in | Wt 151.4 lb

## 2011-12-02 DIAGNOSIS — K589 Irritable bowel syndrome without diarrhea: Secondary | ICD-10-CM | POA: Insufficient documentation

## 2011-12-02 DIAGNOSIS — R1310 Dysphagia, unspecified: Secondary | ICD-10-CM | POA: Insufficient documentation

## 2011-12-02 DIAGNOSIS — R197 Diarrhea, unspecified: Secondary | ICD-10-CM

## 2011-12-02 DIAGNOSIS — R109 Unspecified abdominal pain: Secondary | ICD-10-CM | POA: Insufficient documentation

## 2011-12-02 DIAGNOSIS — K219 Gastro-esophageal reflux disease without esophagitis: Secondary | ICD-10-CM

## 2011-12-02 DIAGNOSIS — K529 Noninfective gastroenteritis and colitis, unspecified: Secondary | ICD-10-CM

## 2011-12-02 DIAGNOSIS — R634 Abnormal weight loss: Secondary | ICD-10-CM

## 2011-12-02 NOTE — Progress Notes (Signed)
Referring Provider: Dr Juanetta Gosling Primary Care Physician:  Fredirick Maudlin, MD Primary Gastroenterologist:  Dr. Jena Gauss  Chief Complaint  Patient presents with  . Abdominal Pain  . Nausea    HPI:  [name] is a 50 y.o. female here as a referral from Dr. Juanetta Gosling.  Hx IBS & bulemia.  Chronic GERD w/ heartburn, palpitations & pressure in esophagus.  C/o dysphagia w/ both liquids/solids x 6 mo.  Hx EGD Dr Loreta Ave yrs ago- found HH.  C/o RUQ pain radiates to RLQ, pain 7/10, constant x 1 week.  Levsin helps w/ pain.  Ate a couple almonds & felt pain.  "Burning" + chills, no fever.  Vomiting twice 2 days ago.  Diarrhea w/ mucous past couple weeks 2-3 times per day.  Denies rectal bleeding or melena. Usuallly alternates w/ constipation, but not recently.  +Recent antibiotics.  + Well water.  C/o hair falling out, fatigue.  Wt loss of 30# in past 6 mo.  TSH 1.193 on 11/12/11.  She had normal hepatic function panel, CBC, and met 7.  Vaginal cuff slightly prominent with soft tissue thickening noted on last CT of abdomen and pelvis with IV contrast from 03/2011. Otherwise normal CT.  Past Medical History  Diagnosis Date  . Arthritis   . COPD (chronic obstructive pulmonary disease)   . Bronchitis   . Fibromyalgia   . Interstitial cystitis   . Migraine   . Depression with anxiety   . UTI (lower urinary tract infection)   . RLS (restless legs syndrome)   . Diverticulitis of colon (without mention of hemorrhage)   . Alopecia, unspecified   . Cervicalgia   . Tobacco use disorder   . PTSD (post-traumatic stress disorder)   . IBS (irritable bowel syndrome)    Past Surgical History  Procedure Date  . Abdominal hysterectomy   . Cholecystectomy   . Tonsillectomy   . Cardiac catheterization    Current Outpatient Prescriptions  Medication Sig Dispense Refill  . ACIPHEX 20 MG tablet Take 20 mg by mouth daily.       Marland Kitchen ALPRAZolam (XANAX) 1 MG tablet Take 1 mg by mouth at bedtime as needed. For sleep        . estradiol (VIVELLE-DOT) 0.1 MG/24HR Place 1 patch onto the skin 2 (two) times a week.        Marland Kitchen FLUoxetine (PROZAC) 40 MG capsule Take 40 mg by mouth daily.        . hyoscyamine (LEVSIN SL) 0.125 MG SL tablet Take 0.125 mg by mouth every 4 (four) hours as needed.       . ondansetron (ZOFRAN) 4 MG tablet Take 4 mg by mouth every 8 (eight) hours as needed.       Marland Kitchen oxyCODONE-acetaminophen (PERCOCET) 10-325 MG per tablet Take 1 tablet by mouth every 4 (four) hours as needed.        . pramipexole (MIRAPEX) 0.5 MG tablet Take 0.5 mg by mouth 3 (three) times daily.       Marland Kitchen topiramate (TOPAMAX) 50 MG tablet Take 50 mg by mouth daily.        . VENTOLIN HFA 108 (90 BASE) MCG/ACT inhaler 2 puffs Daily.      Marland Kitchen amphetamine-dextroamphetamine (ADDERALL) 20 MG tablet Take 1 capsule by mouth Once daily as needed.       Allergies as of 12/02/2011 - Review Complete 12/02/2011  Allergen Reaction Noted  . Clarithromycin Itching and Nausea And Vomiting   . Pseudoephedrine Hives   .  Sulfa antibiotics Nausea And Vomiting 08/28/2011  . Factive (gemifloxacin mesylate) Rash 08/28/2011  . Prochlorperazine edisylate Anxiety   . Promethazine hcl Anxiety   . Trimethobenzamide hcl Anxiety    Family History:There is no known family history of colorectal carcinoma , liver disease, or inflammatory bowel disease.  Problem Relation Age of Onset  . Coronary artery disease Mother   . Alcohol abuse Father   . Diabetes Father   . COPD Father    History   Social History  . Marital Status: Married    Spouse Name: N/A    Number of Children: 1  . Years of Education: N/A   Occupational History  . disabled    Social History Main Topics  . Smoking status: Current Everyday Smoker -- 0.2 packs/day for 8 years  . Smokeless tobacco: Not on file  . Alcohol Use: No  . Drug Use: No  . Sexually Active: Not on file  Review of Systems: Gen: Positive for chills, anorexia, fatigue, weakness, malaise, and sleep disorder CV:  Denies chest pain, angina, palpitations, syncope, orthopnea, PND, peripheral edema, and claudication. Resp: Denies dyspnea at rest, dyspnea with exercise, cough, sputum, wheezing, coughing up blood, and pleurisy. GI: Denies vomiting blood, jaundice.  GU : Positive for recent UTI urinary burning, urinary frequency, urinary hesitancy, nocturnal urination. MS: Chronic joint pain, limitation of movement, and swelling, stiffness, low back pain, extremity pain. Chronic muscle weakness, cramps with history of fibromyalgia. Derm: Denies rash, itching, dry skin, hives, moles, warts, or unhealing ulcers.  Psych: Denies depression, anxiety, memory loss, suicidal ideation, hallucinations, paranoia, and confusion. Heme: Denies bruising, bleeding, and enlarged lymph nodes.  Physical Exam: BP 120/80  Pulse 75  Temp(Src) 97.4 F (36.3 C) (Temporal)  Ht 5\' 1"  (1.549 m)  Wt 151 lb 6.4 oz (68.675 kg)  BMI 28.61 kg/m2  LMP 12/29/1993 General:   Alert,  Well-developed, well-nourished, pleasant and cooperative in NAD.  She is accompanied by her friend today. Head:  Normocephalic and atraumatic. Eyes:  Sclera clear, no icterus.   Conjunctiva pink. Ears:  Normal auditory acuity. Nose:  No deformity, discharge,  or lesions. Mouth:  No deformity or lesions, oropharynx pink & moist. Neck:  Supple; no masses or thyromegaly. Lungs:  Clear throughout to auscultation.   No wheezes, crackles, or rhonchi. No acute distress. Heart:  Regular rate and rhythm; no murmurs, clicks, rubs,  or gallops. Abdomen:  Soft, nontender and nondistended. No masses, hepatosplenomegaly or hernias noted. Normal bowel sounds, without guarding, and without rebound.   Rectal:  Deferred until time of colonoscopy.   Msk:  Symmetrical without gross deformities. Normal posture. Pulses:  Normal pulses noted. Extremities:  Without clubbing or edema. Neurologic:  Alert and  oriented x4;  grossly normal neurologically. Skin:  Intact without  significant lesions or rashes. Cervical Nodes:  No significant cervical adenopathy. Psych:  Alert and cooperative. Normal mood and affect.

## 2011-12-02 NOTE — Patient Instructions (Signed)
We have requested lab work from Dr. Juanetta Gosling office if we need any further labs we will call you. Please get your stool samples to the lab as soon as possible. You will need an EGD and possibly a colonoscopy with Dr. Jena Gauss in the near future. Multimedia programmer daily.   Continue AcipHex 20 mg daily. Continue Levsin as needed up to 4 times a day. Follow up with your gynecologist regarding Pap smear and GYN exam, as well as vaginal cuff changes are noted on last CT scan.

## 2011-12-04 ENCOUNTER — Encounter: Payer: Self-pay | Admitting: Urgent Care

## 2011-12-04 LAB — HEPATIC FUNCTION PANEL
ALT: 11 U/L (ref 7–35)
AST: 14 U/L
Bilirubin, Direct: 0.1 mg/dL (ref 0.01–0.4)
TSH: 1.19 u[IU]/mL (ref 0.41–5.90)
Total Protein ELP: 6.6
Vit D, 25-Hydroxy: 40

## 2011-12-04 NOTE — Assessment & Plan Note (Signed)
Chronic GERD with dysphagia to both solids and only quit. She is going to need EGD with possible esophageal dilation to look for complicated GERD , gastritis, Esophageal web, ring, or stricture. She has a history of bulimia, but tells me she is not actively bulimic at this time.  I have discussed risks & benefits which include, but are not limited to, bleeding, infection, perforation & drug reaction.  The patient agrees with this plan & written consent will be obtained.   Procedure will need to be done with deep sedation (propofol) in the OR under the direction of anesthesia services for multiple psychoactive medications.

## 2011-12-04 NOTE — Assessment & Plan Note (Addendum)
Multifactorial. Underlying history of irritable bowel syndrome and functional abdominal pain.  See workup for GERD and diarrhea.  Follow up with your gynecologist regarding Pap smear and GYN exam, as well as vaginal cuff changes are noted on last CT scan. Pt agrees.

## 2011-12-04 NOTE — Assessment & Plan Note (Addendum)
[name] is a 50 y.o. female referred to the courtesy of Dr. Juanetta Gosling for GERD, diarrhea, and abdominal pain. Over the last 6 months she's also had significant weight loss reported at 30 pounds. She is underlying history of irritable bowel syndrome, however this diarrhea seems to be worse than her baseline. She is going to need further evaluation was complete set of stool studies to rule out C. difficile given recent antibiotic use, infectious process, and inflammatory bowel disease. If stool studies fail to reveal calls of her diarrhea, she is going to need ileocolonoscopy with possible biopsies.  I have discussed risks & benefits which include, but are not limited to, bleeding, infection, perforation & drug reaction.  The patient agrees with this plan & written consent will be obtained.    Stool culture, lactoferrin, giardia, c diff.  TTG IgA. Multimedia programmer daily.   Continue Levsin as needed up to 4 times a day after returning stool studies. Plenty fluids.

## 2011-12-04 NOTE — Progress Notes (Signed)
Cc to PCP 

## 2011-12-04 NOTE — Assessment & Plan Note (Signed)
Unintentional weight loss is concerning and patient will undergo EGD and colonoscopy for further evaluation to rule out occult malignancy, inflammatory bowel disease, gastritis or peptic ulcer disease.

## 2011-12-04 NOTE — Assessment & Plan Note (Signed)
EGD/Esophageal dilation as needed. Continue AcipHex 20 mg daily.

## 2011-12-10 ENCOUNTER — Telehealth: Payer: Self-pay | Admitting: Internal Medicine

## 2011-12-10 NOTE — Telephone Encounter (Signed)
Pt called to see if she could get another stool container. She was told that she would have to re-do her stool sample since she had been sick with the flu. I told her that she could stop by front window and I would let the nurse know.

## 2011-12-10 NOTE — Telephone Encounter (Signed)
The patient turned stools in to lab?

## 2011-12-10 NOTE — Telephone Encounter (Signed)
Stool containers and orders at front desk.

## 2011-12-10 NOTE — Telephone Encounter (Signed)
Called the lab- they have no record of the pt coming in and them telling her she could not turn in her stool studies because she had the flu.

## 2011-12-16 LAB — GIARDIA/CRYPTOSPORIDIUM (EIA)
Cryptosporidium Screen (EIA): NEGATIVE
Giardia Screen (EIA): NEGATIVE

## 2011-12-16 LAB — FECAL LACTOFERRIN, QUANT: Lactoferrin: NEGATIVE

## 2011-12-17 ENCOUNTER — Telehealth: Payer: Self-pay | Admitting: Internal Medicine

## 2011-12-17 ENCOUNTER — Encounter (HOSPITAL_COMMUNITY): Payer: Self-pay | Admitting: Pharmacy Technician

## 2011-12-17 LAB — CLOSTRIDIUM DIFFICILE BY PCR: Toxigenic C. Difficile by PCR: NOT DETECTED

## 2011-12-17 NOTE — Progress Notes (Signed)
Quick Note:  Please call lap to check on c diff Thanks ______

## 2011-12-17 NOTE — Telephone Encounter (Signed)
I don't see CDiff results???

## 2011-12-17 NOTE — Telephone Encounter (Signed)
Patient is wanting Lab Results & Stool Cx Results Please advise?

## 2011-12-17 NOTE — Telephone Encounter (Signed)
It is under the microbiology tab in her chart. It showed not detected on 12/13/11

## 2011-12-18 NOTE — Progress Notes (Signed)
Quick Note:  Please call patient. Her stool studies are all normal. There is no evidence of celiac disease either. She should proceed with colonoscopy and EGD as planned. Thanks ______

## 2011-12-19 LAB — STOOL CULTURE

## 2011-12-25 ENCOUNTER — Other Ambulatory Visit: Payer: Self-pay | Admitting: Gastroenterology

## 2011-12-25 DIAGNOSIS — R634 Abnormal weight loss: Secondary | ICD-10-CM

## 2011-12-25 MED ORDER — PEG-KCL-NACL-NASULF-NA ASC-C 100 G PO SOLR: 1.0000 | Freq: Once | ORAL | Status: DC

## 2011-12-26 ENCOUNTER — Encounter (HOSPITAL_COMMUNITY)
Admission: RE | Admit: 2011-12-26 | Discharge: 2011-12-26 | Payer: Medicare Other | Source: Ambulatory Visit | Admitting: Internal Medicine

## 2011-12-26 NOTE — Patient Instructions (Addendum)
[name]  12/26/2011   Your procedure is scheduled on:  01/01/2012  Report to [facility] at  930  AM.  Call this number if you have problems the morning of surgery: [phone]   Remember:   Do not eat food:After Midnight.  May have clear liquids:until Midnight .  Clear liquids include soda, tea, black coffee, apple or grape juice, broth.  Take these medicines the morning of surgery with A SIP OF WATER: aciphex,xanax,prozac,topamax. Take ventolin inhaler before you come.   Do not wear jewelry, make-up or nail polish.  Do not wear lotions, powders, or perfumes. You may wear deodorant.  Do not shave 48 hours prior to surgery.  Do not bring valuables to the hospital.  Contacts, dentures or bridgework may not be worn into surgery.  Leave suitcase in the car. After surgery it may be brought to your room.  For patients admitted to the hospital, checkout time is 11:00 AM the day of discharge.   Patients discharged the day of surgery will not be allowed to drive home.  Name and phone number of your driver: family  Special Instructions: N/A   Please read over the following fact sheets that you were given: Pain Booklet, Surgical Site Infection Prevention, Anesthesia Post-op Instructions and Care and Recovery After Surgery Esophagogastroduodenoscopy This is an endoscopic procedure (a procedure that uses a device like a flexible telescope) that allows your caregiver to view the upper stomach and small bowel. This test allows your caregiver to look at the esophagus. The esophagus carries food from your mouth to your stomach. They can also look at your duodenum. This is the first part of the small intestine that attaches to the stomach. This test is used to detect problems in the bowel such as ulcers and inflammation. PREPARATION FOR TEST Nothing to eat after midnight the day before the test. NORMAL FINDINGS Normal esophagus, stomach, and duodenum. Ranges for normal findings may vary among  different laboratories and hospitals. You should always check with your doctor after having lab work or other tests done to discuss the meaning of your test results and whether your values are considered within normal limits. MEANING OF TEST  Your caregiver will go over the test results with you and discuss the importance and meaning of your results, as well as treatment options and the need for additional tests if necessary. OBTAINING THE TEST RESULTS It is your responsibility to obtain your test results. Ask the lab or department performing the test when and how you will get your results. Document Released: 04/17/2005 Document Revised: 08/27/2011 Document Reviewed: 11/24/2008 [facility] Patient Information 2012 [place], [place].Colonoscopy A colonoscopy is an exam to evaluate your entire colon. In this exam, your colon is cleansed. A long fiberoptic tube is inserted through your rectum and into your colon. The fiberoptic scope (endoscope) is a long bundle of enclosed and very flexible fibers. These fibers transmit light to the area examined and send images from that area to your caregiver. Discomfort is usually minimal. You may be given a drug to help you sleep (sedative) during or prior to the procedure. This exam helps to detect lumps (tumors), polyps, inflammation, and areas of bleeding. Your caregiver may also take a small piece of tissue (biopsy) that will be examined under a microscope. LET YOUR CAREGIVER KNOW ABOUT:   Allergies to food or medicine.   Medicines taken, including vitamins, herbs, eyedrops, over-the-counter medicines, and creams.   Use of steroids (by mouth or creams).  Previous problems with anesthetics or numbing medicines.   History of bleeding problems or blood clots.   Previous surgery.   Other health problems, including diabetes and kidney problems.   Possibility of pregnancy, if this applies.  BEFORE THE PROCEDURE   A clear liquid diet may be required for 2 days  before the exam.   Ask your caregiver about changing or stopping your regular medications.   Liquid injections (enemas) or laxatives may be required.   A large amount of electrolyte solution may be given to you to drink over a short period of time. This solution is used to clean out your colon.   You should be present 60 minutes prior to your procedure or as directed by your caregiver.  AFTER THE PROCEDURE   If you received a sedative or pain relieving medication, you will need to arrange for someone to drive you home.   Occasionally, there is a little blood passed with the first bowel movement. Do not be concerned.  FINDING OUT THE RESULTS OF YOUR TEST Not all test results are available during your visit. If your test results are not back during the visit, make an appointment with your caregiver to find out the results. Do not assume everything is normal if you have not heard from your caregiver or the medical facility. It is important for you to follow up on all of your test results. HOME CARE INSTRUCTIONS   It is not unusual to pass moderate amounts of gas and experience mild abdominal cramping following the procedure. This is due to air being used to inflate your colon during the exam. Walking or a warm pack on your belly (abdomen) may help.   You may resume all normal meals and activities after sedatives and medicines have worn off.   Only take over-the-counter or prescription medicines for pain, discomfort, or fever as directed by your caregiver. Do not use aspirin or blood thinners if a biopsy was taken. Consult your caregiver for medicine usage if biopsies were taken.  SEEK IMMEDIATE MEDICAL CARE IF:   You have a fever.   You pass large blood clots or fill a toilet with blood following the procedure. This may also occur 10 to 14 days following the procedure. This is more likely if a biopsy was taken.   You develop abdominal pain that keeps getting worse and cannot be relieved  with medicine.  Document Released: 12/12/2000 Document Revised: 08/27/2011 Document Reviewed: 07/27/2008 [facility] Patient Information 2012 [place], [place].PATIENT INSTRUCTIONS POST-ANESTHESIA  IMMEDIATELY FOLLOWING SURGERY:  Do not drive or operate machinery for the first twenty four hours after surgery.  Do not make any important decisions for twenty four hours after surgery or while taking narcotic pain medications or sedatives.  If you develop intractable nausea and vomiting or a severe headache please notify your doctor immediately.  FOLLOW-UP:  Please make an appointment with your surgeon as instructed. You do not need to follow up with anesthesia unless specifically instructed to do so.  WOUND CARE INSTRUCTIONS (if applicable):  Keep a dry clean dressing on the anesthesia/puncture wound site if there is drainage.  Once the wound has quit draining you may leave it open to air.  Generally you should leave the bandage intact for twenty four hours unless there is drainage.  If the epidural site drains for more than 36-48 hours please call the anesthesia department.  QUESTIONS?:  Please feel free to call your physician or the hospital operator if you have any questions,  and they will be happy to assist you.     [facility] Anesthesia Department [address] [place] [phone]

## 2011-12-29 ENCOUNTER — Ambulatory Visit (HOSPITAL_COMMUNITY)
Admission: RE | Admit: 2011-12-29 | Discharge: 2011-12-29 | Disposition: A | Discharge status: Home / Self Care | Payer: Medicare Other | Source: Ambulatory Visit | Attending: Internal Medicine | Admitting: Internal Medicine

## 2011-12-29 ENCOUNTER — Encounter (HOSPITAL_COMMUNITY): Payer: Self-pay

## 2011-12-29 HISTORY — DX: Other specified postprocedural states: Z98.890

## 2011-12-29 HISTORY — DX: Shortness of breath: R06.02

## 2011-12-29 HISTORY — DX: Calculus of kidney: N20.0

## 2011-12-29 HISTORY — DX: Anxiety disorder, unspecified: F41.9

## 2011-12-29 HISTORY — DX: Gastro-esophageal reflux disease without esophagitis: K21.9

## 2011-12-29 HISTORY — DX: Nausea with vomiting, unspecified: R11.2

## 2011-12-29 LAB — BASIC METABOLIC PANEL
CO2: 24 mEq/L (ref 19–32)
Chloride: 106 mEq/L (ref 96–112)
GFR calc Af Amer: >90 mL/min (ref >90)
Potassium: 3.8 mEq/L (ref 3.5–5.1)

## 2011-12-29 LAB — HEMOGLOBIN AND HEMATOCRIT, BLOOD: Hemoglobin: 13.1 g/dL (ref 12.0–15.0)

## 2012-01-01 ENCOUNTER — Encounter (HOSPITAL_COMMUNITY): Payer: Self-pay | Admitting: *Deleted

## 2012-01-01 ENCOUNTER — Encounter (HOSPITAL_COMMUNITY)
Admission: RE | Disposition: A | Discharge status: Home / Self Care | Payer: Self-pay | Source: Ambulatory Visit | Attending: Internal Medicine

## 2012-01-01 ENCOUNTER — Ambulatory Visit (HOSPITAL_COMMUNITY)
Admission: RE | Admit: 2012-01-01 | Discharge: 2012-01-01 | Disposition: A | Discharge status: Home / Self Care | Payer: Medicare Other | Source: Ambulatory Visit | Attending: Internal Medicine | Admitting: Internal Medicine

## 2012-01-01 ENCOUNTER — Emergency Department (HOSPITAL_COMMUNITY)
Admission: EM | Admit: 2012-01-01 | Discharge: 2012-01-01 | Disposition: A | Discharge status: Home / Self Care | Payer: Medicare Other | Attending: Emergency Medicine | Admitting: Emergency Medicine

## 2012-01-01 ENCOUNTER — Emergency Department (HOSPITAL_COMMUNITY): Payer: Medicare Other

## 2012-01-01 ENCOUNTER — Other Ambulatory Visit: Payer: Self-pay | Admitting: Internal Medicine

## 2012-01-01 ENCOUNTER — Ambulatory Visit (HOSPITAL_COMMUNITY): Payer: Medicare Other | Admitting: Anesthesiology

## 2012-01-01 ENCOUNTER — Encounter (HOSPITAL_COMMUNITY): Payer: Self-pay | Admitting: Anesthesiology

## 2012-01-01 ENCOUNTER — Encounter (HOSPITAL_COMMUNITY): Payer: Self-pay

## 2012-01-01 ENCOUNTER — Other Ambulatory Visit: Payer: Self-pay

## 2012-01-01 DIAGNOSIS — F341 Dysthymic disorder: Secondary | ICD-10-CM | POA: Insufficient documentation

## 2012-01-01 DIAGNOSIS — K219 Gastro-esophageal reflux disease without esophagitis: Secondary | ICD-10-CM | POA: Insufficient documentation

## 2012-01-01 DIAGNOSIS — IMO0001 Reserved for inherently not codable concepts without codable children: Secondary | ICD-10-CM | POA: Insufficient documentation

## 2012-01-01 DIAGNOSIS — J449 Chronic obstructive pulmonary disease, unspecified: Secondary | ICD-10-CM | POA: Insufficient documentation

## 2012-01-01 DIAGNOSIS — R1031 Right lower quadrant pain: Secondary | ICD-10-CM | POA: Insufficient documentation

## 2012-01-01 DIAGNOSIS — Z9079 Acquired absence of other genital organ(s): Secondary | ICD-10-CM | POA: Insufficient documentation

## 2012-01-01 DIAGNOSIS — R109 Unspecified abdominal pain: Secondary | ICD-10-CM | POA: Insufficient documentation

## 2012-01-01 DIAGNOSIS — R197 Diarrhea, unspecified: Secondary | ICD-10-CM | POA: Insufficient documentation

## 2012-01-01 DIAGNOSIS — D126 Benign neoplasm of colon, unspecified: Secondary | ICD-10-CM | POA: Insufficient documentation

## 2012-01-01 DIAGNOSIS — G8929 Other chronic pain: Secondary | ICD-10-CM | POA: Insufficient documentation

## 2012-01-01 DIAGNOSIS — Z860100 Personal history of colon polyps, unspecified: Secondary | ICD-10-CM | POA: Insufficient documentation

## 2012-01-01 DIAGNOSIS — G43909 Migraine, unspecified, not intractable, without status migrainosus: Secondary | ICD-10-CM | POA: Insufficient documentation

## 2012-01-01 DIAGNOSIS — M129 Arthropathy, unspecified: Secondary | ICD-10-CM | POA: Insufficient documentation

## 2012-01-01 DIAGNOSIS — K589 Irritable bowel syndrome without diarrhea: Secondary | ICD-10-CM | POA: Insufficient documentation

## 2012-01-01 DIAGNOSIS — Z136 Encounter for screening for cardiovascular disorders: Secondary | ICD-10-CM | POA: Insufficient documentation

## 2012-01-01 DIAGNOSIS — J4489 Other specified chronic obstructive pulmonary disease: Secondary | ICD-10-CM | POA: Insufficient documentation

## 2012-01-01 DIAGNOSIS — K5710 Diverticulosis of small intestine without perforation or abscess without bleeding: Secondary | ICD-10-CM | POA: Insufficient documentation

## 2012-01-01 DIAGNOSIS — R112 Nausea with vomiting, unspecified: Secondary | ICD-10-CM | POA: Insufficient documentation

## 2012-01-01 DIAGNOSIS — Z01812 Encounter for preprocedural laboratory examination: Secondary | ICD-10-CM | POA: Insufficient documentation

## 2012-01-01 DIAGNOSIS — Z87442 Personal history of urinary calculi: Secondary | ICD-10-CM | POA: Insufficient documentation

## 2012-01-01 DIAGNOSIS — R1310 Dysphagia, unspecified: Secondary | ICD-10-CM | POA: Insufficient documentation

## 2012-01-01 DIAGNOSIS — D131 Benign neoplasm of stomach: Secondary | ICD-10-CM | POA: Insufficient documentation

## 2012-01-01 DIAGNOSIS — F17200 Nicotine dependence, unspecified, uncomplicated: Secondary | ICD-10-CM | POA: Insufficient documentation

## 2012-01-01 DIAGNOSIS — F4310 Post-traumatic stress disorder, unspecified: Secondary | ICD-10-CM | POA: Insufficient documentation

## 2012-01-01 HISTORY — PX: ESOPHAGEAL DILATION: SHX303

## 2012-01-01 HISTORY — PX: OTHER SURGICAL HISTORY: SHX169

## 2012-01-01 LAB — URINALYSIS, ROUTINE W REFLEX MICROSCOPIC
Glucose, UA: NEGATIVE mg/dL
Ketones, ur: NEGATIVE mg/dL
Leukocytes, UA: NEGATIVE
Protein, ur: NEGATIVE mg/dL

## 2012-01-01 SURGERY — COLONOSCOPY WITH PROPOFOL
Anesthesia: Monitor Anesthesia Care | Site: Rectum

## 2012-01-01 MED ORDER — MIDAZOLAM HCL 2 MG/2ML IJ SOLN
INTRAMUSCULAR | Status: AC
  Filled 2012-01-01: qty 2

## 2012-01-01 MED ORDER — DEXAMETHASONE SODIUM PHOSPHATE 4 MG/ML IJ SOLN
INTRAMUSCULAR | Status: AC
  Administered 2012-01-01: 4 mg via INTRAVENOUS
  Filled 2012-01-01: qty 1

## 2012-01-01 MED ORDER — HYDROMORPHONE HCL PF 1 MG/ML IJ SOLN
1.0000 mg | Freq: Once | INTRAMUSCULAR | Status: AC
  Administered 2012-01-01: 1 mg via INTRAVENOUS
  Filled 2012-01-01: qty 1

## 2012-01-01 MED ORDER — ONDANSETRON HCL 4 MG/2ML IJ SOLN
INTRAMUSCULAR | Status: AC
  Filled 2012-01-01: qty 2

## 2012-01-01 MED ORDER — BUTAMBEN-TETRACAINE-BENZOCAINE 2-2-14 % EX AERO
1.0000 | INHALATION_SPRAY | Freq: Once | CUTANEOUS | Status: AC
  Administered 2012-01-01: 1 via TOPICAL
  Filled 2012-01-01: qty 56

## 2012-01-01 MED ORDER — LACTATED RINGERS IV SOLN
INTRAVENOUS | Status: DC
  Administered 2012-01-01: 75 mL via INTRAVENOUS

## 2012-01-01 MED ORDER — DIPHENHYDRAMINE HCL 25 MG PO CAPS
25.0000 mg | ORAL_CAPSULE | Freq: Once | ORAL | Status: AC
  Administered 2012-01-01: 25 mg via ORAL
  Filled 2012-01-01: qty 1

## 2012-01-01 MED ORDER — ONDANSETRON HCL 4 MG/2ML IJ SOLN: 4.0000 mg | Freq: Once | INTRAMUSCULAR | Status: DC | PRN

## 2012-01-01 MED ORDER — STERILE WATER FOR IRRIGATION IR SOLN
Status: DC | PRN
  Administered 2012-01-01: 11:00:00

## 2012-01-01 MED ORDER — MIDAZOLAM HCL 2 MG/2ML IJ SOLN
1.0000 mg | INTRAMUSCULAR | Status: DC | PRN
  Administered 2012-01-01: 2 mg via INTRAVENOUS

## 2012-01-01 MED ORDER — ONDANSETRON HCL 4 MG/2ML IJ SOLN
4.0000 mg | Freq: Once | INTRAMUSCULAR | Status: AC
  Administered 2012-01-01: 4 mg via INTRAVENOUS

## 2012-01-01 MED ORDER — MIDAZOLAM HCL 2 MG/2ML IJ SOLN
INTRAMUSCULAR | Status: AC
  Administered 2012-01-01: 2 mg via INTRAVENOUS
  Filled 2012-01-01: qty 2

## 2012-01-01 MED ORDER — ONDANSETRON HCL 4 MG/2ML IJ SOLN
4.0000 mg | Freq: Once | INTRAMUSCULAR | Status: AC
  Administered 2012-01-01: 4 mg via INTRAVENOUS
  Filled 2012-01-01: qty 2

## 2012-01-01 MED ORDER — PROPOFOL 10 MG/ML IV EMUL
INTRAVENOUS | Status: DC | PRN
  Administered 2012-01-01: 12:00:00 via INTRAVENOUS
  Administered 2012-01-01: 100 ug/kg/min via INTRAVENOUS

## 2012-01-01 MED ORDER — FENTANYL CITRATE 0.05 MG/ML IJ SOLN
INTRAMUSCULAR | Status: DC | PRN
  Administered 2012-01-01 (×2): 50 ug via INTRAVENOUS

## 2012-01-01 MED ORDER — ONDANSETRON HCL 4 MG/2ML IJ SOLN
INTRAMUSCULAR | Status: AC
  Administered 2012-01-01: 4 mg via INTRAVENOUS
  Filled 2012-01-01: qty 2

## 2012-01-01 MED ORDER — MIDAZOLAM HCL 5 MG/5ML IJ SOLN
INTRAMUSCULAR | Status: DC | PRN
  Administered 2012-01-01: 2 mg via INTRAVENOUS

## 2012-01-01 MED ORDER — SODIUM CHLORIDE 0.9 % IV BOLUS (SEPSIS)
1000.0000 mL | Freq: Once | INTRAVENOUS | Status: AC
  Administered 2012-01-01: 1000 mL via INTRAVENOUS

## 2012-01-01 MED ORDER — PROPOFOL 10 MG/ML IV EMUL
INTRAVENOUS | Status: AC
  Filled 2012-01-01: qty 20

## 2012-01-01 MED ORDER — FENTANYL CITRATE 0.05 MG/ML IJ SOLN
INTRAMUSCULAR | Status: AC
  Filled 2012-01-01: qty 2

## 2012-01-01 MED ORDER — GLYCOPYRROLATE 0.2 MG/ML IJ SOLN
0.2000 mg | Freq: Once | INTRAMUSCULAR | Status: AC
  Administered 2012-01-01: 0.2 mg via INTRAVENOUS

## 2012-01-01 MED ORDER — FENTANYL CITRATE 0.05 MG/ML IJ SOLN: 25.0000 ug | INTRAMUSCULAR | Status: DC | PRN

## 2012-01-01 MED ORDER — GLYCOPYRROLATE 0.2 MG/ML IJ SOLN
INTRAMUSCULAR | Status: AC
  Filled 2012-01-01: qty 1

## 2012-01-01 MED ORDER — GLYCOPYRROLATE 0.2 MG/ML IJ SOLN
INTRAMUSCULAR | Status: AC
  Administered 2012-01-01: 0.2 mg via INTRAVENOUS
  Filled 2012-01-01: qty 1

## 2012-01-01 MED ORDER — DEXAMETHASONE SODIUM PHOSPHATE 4 MG/ML IJ SOLN
4.0000 mg | Freq: Once | INTRAMUSCULAR | Status: AC
Start: 2012-01-01 — End: 2012-01-01
  Administered 2012-01-01: 4 mg via INTRAVENOUS

## 2012-01-01 SURGICAL SUPPLY — 26 items
BLOCK BITE 60FR ADLT L/F BLUE (MISCELLANEOUS) ×3 IMPLANT
DEVICE CLIP HEMOSTAT 235CM (CLIP) IMPLANT
ELECT REM PT RETURN 9FT ADLT (ELECTROSURGICAL) ×3
ELECTRODE REM PT RTRN 9FT ADLT (ELECTROSURGICAL) ×2 IMPLANT
FCP BXJMBJMB 240X2.8X (CUTTING FORCEPS)
FLOOR PAD 36X40 (MISCELLANEOUS) ×3
FORCEP RJ3 GP 1.8X160 W-NEEDLE (CUTTING FORCEPS) IMPLANT
FORCEPS BIOP RAD 4 LRG CAP 4 (CUTTING FORCEPS) IMPLANT
FORCEPS BIOP RJ4 240 W/NDL (CUTTING FORCEPS)
FORCEPS BXJMBJMB 240X2.8X (CUTTING FORCEPS) IMPLANT
INJECTOR/SNARE I SNARE (MISCELLANEOUS) IMPLANT
LUBRICANT JELLY 4.5OZ STERILE (MISCELLANEOUS) ×3 IMPLANT
MANIFOLD NEPTUNE II (INSTRUMENTS) ×3 IMPLANT
MANIFOLD NEPTUNE WASTE (CANNULA) ×3 IMPLANT
NEEDLE SCLEROTHERAPY 25GX240 (NEEDLE) IMPLANT
PAD FLOOR 36X40 (MISCELLANEOUS) ×2 IMPLANT
PROBE APC STR FIRE (PROBE) IMPLANT
PROBE INJECTION GOLD (MISCELLANEOUS)
PROBE INJECTION GOLD 7FR (MISCELLANEOUS) IMPLANT
SNARE ROTATE MED OVAL 20MM (MISCELLANEOUS) ×3 IMPLANT
SPONGE GAUZE 4X4 12PLY (GAUZE/BANDAGES/DRESSINGS) ×6 IMPLANT
SYR 50ML LL SCALE MARK (SYRINGE) ×3 IMPLANT
TRAP SPECIMEN MUCOUS 40CC (MISCELLANEOUS) IMPLANT
TUBING ENDO SMARTCAP PENTAX (MISCELLANEOUS) ×3 IMPLANT
TUBING IRRIGATION ENDOGATOR (MISCELLANEOUS) ×3 IMPLANT
WATER STERILE IRR 1000ML POUR (IV SOLUTION) ×3 IMPLANT

## 2012-01-01 NOTE — H&P (Signed)
  I have seen & examined the patient prior to the procedure(s) today and reviewed the history and physical/consultation.  There have been no changes.  After consideration of the risks, benefits, alternatives and imponderables, the patient has consented to the procedure(s).   

## 2012-01-01 NOTE — ED Notes (Signed)
Pt requesting pain medication and states "Benadryl works great for my nausea." EDP made aware. Orders received.

## 2012-01-01 NOTE — ED Notes (Signed)
MD at bedside. 

## 2012-01-01 NOTE — Op Note (Signed)
[facility] [address] [place], [place]  [address]  COLONOSCOPY PROCEDURE REPORT  PATIENT:  [name], [name]  MR#:  [id number] BIRTHDATE:  04-19-1961, 50 yrs. old  GENDER:  female ENDOSCOPIST:  R. Roetta Sessions, MD FACP [facility] REF. BY:          Dr. Juanetta Gosling PROCEDURE DATE:  01/01/2012 PROCEDURE:  ileo-colonoscopy with biopsy and multiple snare polypectomies  INDICATIONS:  chronic diarrhea.  INFORMED CONSENT:  The risks, benefits, alternatives and imponderables including but not limited to bleeding, perforation as well as the possibility of a missed lesion have been reviewed. The potential for biopsy, lesion removal, etc. have also been discussed.  Questions have been answered.  All parties agreeable. Please see the history  and physical in the medical record for more information.  MEDICATIONS:  deep sedation per Dr. [facility]  DESCRIPTION OF PROCEDURE:  After a digital rectal exam was performed, the  colonoscope was advanced from the anus through the rectum and colon to the area of the cecum, ileocecal valve and appendiceal orifice.  The cecum was deeply intubated.  These structures were well-seen and photographed for the record.  From the level of the cecum and ileocecal valve, the scope was slowly and cautiously withdrawn.  The mucosal surfaces were carefully surveyed utilizing scope tip deflection to facilitate fold flattening as needed.  The scope was pulled down into the rectum where a thorough examination was performed. Unable to retroflex because of the small nature of the rectal vault. However, the same reason, we were able to see the rectal mucosa very well on face. <<PROCEDUREIMAGES>>  FINDINGS  :adequate preparation.  Multiple colonic polyps-  the largest polyp being found in the proximal sigmoid as described below. Otherwise, normal-appearing colon. The distal 10 cm of terminal ileal mucosa appeared normal  THERAPEUTIC / DIAGNOSTIC MANEUVERS  PERFORMED:   multiple colonic polyps removed with hot or cold snare technique. The largest being a pedunculated, 1.5 cm angry polyp), in the proximal sigmoid segment .  COMPLICATIONS:  none  CECAL WITHDRAWAL TIME:  15 minutes  IMPRESSION:  Multiple colonic polyps removed as described above. Biopsies taken rule out microscopic colitis.  RECOMMENDATIONS:   See pathology report. See EGD report.   _____________________________ R. Roetta Sessions, MD Caleen Essex  CC:  Shaune Pollack, MD  n. eSIGNED:   R. Roetta Sessions at 01/01/2012 12:17 PM  Cox, Bjorn Loser, [phone]

## 2012-01-01 NOTE — ED Notes (Signed)
Pt reports having had pain in r upper and lower abd x 1 year.  Reports pain has been more intense over the past month.  Pt had egd and colonoscopy today and was told had some polyps removed.  Reports has vomited x 2 today.  LBM was early this morning but pt had drank the prep for the colonoscopy.

## 2012-01-01 NOTE — Op Note (Signed)
[facility] [address] [place], [place]  [address]  ENDOSCOPY PROCEDURE REPORT  PATIENT:  [name], Walker  MR#:  [id number] BIRTHDATE:  Jan 18, 1961, 50 yrs. old  GENDER:  female  ENDOSCOPIST:  R. Roetta Sessions, MD [facility] Referred by:          Dr. Juanetta Gosling  PROCEDURE DATE:  01/01/2012 PROCEDURE:  EGD with Elease Hashimoto dilation, gastric and duodenal biopsy  INDICATIONS:  Esophageal dysphagia, chronic diarrhea and abdominal pain.  INFORMED CONSENT:   The risks, benefits, limitations, alternatives and imponderables have been discussed.  The potential for biopsy, esophogeal dilation, etc. have also been reviewed.  Questions have been answered.  All parties agreeable.  Please see the history and physical in the medical record for more information.  MEDICATIONS:   Deep sedation per Dr. [facility]  DESCRIPTION OF PROCEDURE:   The  endoscope was introduced through the mouth and advanced to the second portion of the duodenum without difficulty or limitations.  The mucosal surfaces were surveyed very carefully during advancement of the scope and upon withdrawal.  Retroflexion view of the proximal stomach and esophagogastric junction was performed.  <<PROCEDUREIMAGES>>  FINDINGS:  Normal-appearing, patent tubular esophagus. Multiple small gastric polyps - largest being 3 mm. Otherwise, normal gastric     mucosa. Single large second portion duodenum diverticulum; otherwise, the duodenum through the third portion appeared     normal.  THERAPEUTIC / DIAGNOSTIC MANEUVERS PERFORMED:   A 54 French Maloney dilator was passed easily. Subsequently, one of the gastric polyp was biopsied. Finally, biopsies of the second and third portions of the duodenum were taken for histologic study  COMPLICATIONS:   None  IMPRESSION:     Normal esophagus - status post dilation. Gastric polyps status post biopsy.  Duodenal diverticulum. Status post biopsy  RECOMMENDATIONS:   Followup  on pathology. See colonoscopy  ______________________________ R. Roetta Sessions, MD Caleen Essex  CC:  Shaune Pollack, MD  n. eSIGNED:   R. Roetta Sessions at 01/01/2012 12:11 PM  Cox, Bjorn Loser, [phone]

## 2012-01-01 NOTE — Anesthesia Preprocedure Evaluation (Signed)
Anesthesia Evaluation  Patient identified by MRN, date of birth, ID band Patient awake    Reviewed: Allergy & Precautions, H&P , NPO status   History of Anesthesia Complications (+) PONV  Airway Mallampati: I      Dental  (+) Teeth Intact and Implants   Pulmonary asthma , COPD   Pulmonary exam normal       Cardiovascular neg cardio ROS Regular Normal    Neuro/Psych  Headaches, Anxiety Depression  Neuromuscular disease    GI/Hepatic hiatal hernia, GERD-  Medicated,  Endo/Other    Renal/GU      Musculoskeletal  (+) Fibromyalgia -  Abdominal   Peds  Hematology   Anesthesia Other Findings   Reproductive/Obstetrics                           Anesthesia Physical Anesthesia Plan  ASA: III  Anesthesia Plan: MAC   Post-op Pain Management:    Induction: Intravenous  Airway Management Planned: Simple Face Mask  Additional Equipment:   Intra-op Plan:   Post-operative Plan:   Informed Consent: I have reviewed the patients History and Physical, chart, labs and discussed the procedure including the risks, benefits and alternatives for the proposed anesthesia with the patient or authorized representative who has indicated his/her understanding and acceptance.     Plan Discussed with:   Anesthesia Plan Comments:         Anesthesia Quick Evaluation

## 2012-01-01 NOTE — ED Notes (Signed)
Pt had outpt colonoscopy done today and had polyps removed per pt. Pt called Dr. Jena Gauss and informed pt she may "appendicitis"

## 2012-01-01 NOTE — ED Provider Notes (Signed)
History     CSN: [id number]  Arrival date & time 01/01/12  1646   First MD Initiated Contact with Patient 01/01/12 1806     Chief Complaint  Patient presents with  . Abdominal Pain    Patient is a 51 y.o. female presenting with abdominal pain. The history is provided by the patient.  Abdominal Pain The primary symptoms of the illness include abdominal pain, nausea, vomiting and diarrhea. The primary symptoms of the illness do not include fever, dysuria or vaginal bleeding. Episode onset: a year ago. The onset of the illness was gradual. The problem has been gradually worsening.  The illness is associated with eating. The patient states that she believes she is currently not pregnant. Additional symptoms associated with the illness include chills.  Pt has had abd pain for a year with extensive workup as outpatient She had EGD/colonoscopy with polypectomy today by her GI physician She reports pain is now worse, though it is located in same area (RLQ) as her chronic pain and is similar to prior pain She reports she spoke to her GI physician and called her PCP office and spoke to front office staff who told her to go to the ER  Past Medical History  Diagnosis Date  . Arthritis   . COPD (chronic obstructive pulmonary disease)   . Bronchitis   . Fibromyalgia   . Interstitial cystitis   . Migraine   . Depression with anxiety   . UTI (lower urinary tract infection)   . RLS (restless legs syndrome)   . Diverticulitis of colon (without mention of hemorrhage)   . Alopecia, unspecified   . Cervicalgia   . Tobacco use disorder   . PTSD (post-traumatic stress disorder)   . IBS (irritable bowel syndrome)   . GERD (gastroesophageal reflux disease)   . Dysphagia   . PONV (postoperative nausea and vomiting)   . Shortness of breath   . Kidney stones   . Anxiety     Past Surgical History  Procedure Date  . Abdominal hysterectomy   . Cholecystectomy   . Tonsillectomy   . Cardiac  catheterization   . Cystostomy w/ bladder dilation     x 52 [place]    Family History  Problem Relation Age of Onset  . Coronary artery disease Mother   . Heart attack Mother   . Pulmonary embolism Mother   . Alcohol abuse Father   . Diabetes Father   . COPD Father   . Lung disease Father   . Anesthesia problems Neg Hx   . Hypotension Neg Hx   . Malignant hyperthermia Neg Hx   . Pseudochol deficiency Neg Hx     History  Substance Use Topics  . Smoking status: Current Everyday Smoker -- 0.2 packs/day for 8 years    Types: Cigarettes  . Smokeless tobacco: Not on file  . Alcohol Use: No    OB History    Grav Para Term Preterm Abortions TAB SAB Ect Mult Living   1 1 1       1       Review of Systems  Constitutional: Positive for chills. Negative for fever.  Gastrointestinal: Positive for nausea, vomiting, abdominal pain and diarrhea.  Genitourinary: Negative for dysuria and vaginal bleeding.  All other systems reviewed and are negative.    Allergies  Clarithromycin; Nubain; Pseudoephedrine; Sulfa antibiotics; Factive; Prochlorperazine edisylate; Promethazine hcl; and Trimethobenzamide hcl  Home Medications   Current Outpatient Rx  Name Route Sig Dispense Refill  .  ACIPHEX 20 MG PO TBEC Oral Take 20 mg by mouth daily.     Marland Kitchen ALPRAZOLAM 1 MG PO TABS Oral Take 1 mg by mouth at bedtime as needed. For sleep    . AMPHETAMINE-DEXTROAMPHETAMINE 20 MG PO TABS Oral Take 1 capsule by mouth Once daily as needed. For attention    . CETIRIZINE HCL 10 MG PO TABS Oral Take 10 mg by mouth daily.      . CYCLOSPORINE 0.05 % OP EMUL Both Eyes Place 1 drop into both eyes 2 (two) times daily.      Marland Kitchen ESTRADIOL 0.1 MG/24HR TD PTTW Transdermal Place 1 patch onto the skin 2 (two) times a week.      Marland Kitchen FLUOXETINE HCL 40 MG PO CAPS Oral Take 40 mg by mouth at bedtime.     Marland Kitchen HYOSCYAMINE SULFATE 0.125 MG SL SUBL Oral Take 0.125 mg by mouth every 4 (four) hours as needed. For cramps    .  ONDANSETRON HCL 4 MG PO TABS Oral Take 4 mg by mouth every 8 (eight) hours as needed. For nausea    . OXYCODONE-ACETAMINOPHEN 10-325 MG PO TABS Oral Take 1 tablet by mouth every 4 (four) hours as needed. For pain    . PEG-KCL-NACL-NASULF-NA ASC-C 100 G PO SOLR Oral Take 1 kit (100 g total) by mouth once. As directed Please purchase 1 Fleets enema to use with the prep 1 kit 0  . PRAMIPEXOLE DIHYDROCHLORIDE 0.5 MG PO TABS Oral Take 0.5 mg by mouth at bedtime.     . TOPIRAMATE 50 MG PO TABS Oral Take 25 mg by mouth daily.     . VENTOLIN HFA 108 (90 BASE) MCG/ACT IN AERS Inhalation Inhale 2 puffs into the lungs every 6 (six) hours as needed. For shortness of breath      BP 143/87  Pulse 99  Temp(Src) 98.3 F (36.8 C) (Oral)  Resp 20  Ht 5\' 2"  (1.575 m)  Wt 148 lb (67.132 kg)  BMI 27.07 kg/m2  SpO2 99%  LMP 12/29/1993  Physical Exam CONSTITUTIONAL: Well developed/well nourished HEAD AND FACE: Normocephalic/atraumatic EYES: EOMI/PERRL ENMT: Mucous membranes moist NECK: supple no meningeal signs SPINE:entire spine nontender CV: S1/S2 noted, no murmurs/rubs/gallops noted LUNGS: Lungs are clear to auscultation bilaterally, no apparent distress ABDOMEN: soft, tenderness in RLQ but no rebound/guarding is noted GU:no cva tenderness NEURO: Pt is awake/alert, moves all extremitiesx4 EXTREMITIES: pulses normal, full ROM SKIN: warm, color normal PSYCH: no abnormalities of mood noted    ED Course  Procedures   Labs Reviewed  URINALYSIS, ROUTINE W REFLEX MICROSCOPIC   Dg Abd Acute W/chest  01/01/2012  *RADIOLOGY REPORT*  Clinical Data: The patient had EGD and colonoscopy today.  Eight polyps were removed.  History of chronic abdominal pain, hysterectomy.  ACUTE ABDOMEN SERIES (ABDOMEN 2 VIEW & CHEST 1 VIEW)  Comparison: CT of the abdomen and pelvis 04/05/2011, chest x-ray 08/28/2011  Findings: Cardiomediastinal silhouette is within normal limits. The lungs are free of focal consolidations  and pleural effusions. No edema.  Bowel gas pattern is nonobstructive.  Surgical clips are present in the right upper quadrant of the abdomen.  Multiple calcifications in the pelvis are likely phleboliths. Visualized osseous structures have a normal appearance.  IMPRESSION:  1. No evidence for acute cardiopulmonary abnormality. 2.  Nonobstructive bowel gas pattern.  Original Report Authenticated By: Patterson Hammersmith, M.D.    I discussed with Dr Jena Gauss He reports that her pain is chronic, and not concerned for  acute appendicitis and did not refer her to the ED She is to f/u with surgery as outpatient for her chronic abd pain We did agree to check acute abd plain films as perforation from EGD/Colonoscopy possible though unlikely  I doubt this is an acute abd process after reviewing the Xray findings   8:07 PM Pt stable, vitals appropriate The patient appears reasonably screened and/or stabilized for discharge and I doubt any other medical condition or other [facility] requiring further screening, evaluation, or treatment in the ED at this time prior to discharge.   MDM  Nursing notes reviewed and considered in documentation All labs/vitals reviewed and considered xrays reviewed and considered Previous records reviewed and considered        Date: 01/01/2012  Rate: 84  Rhythm: normal sinus rhythm  QRS Axis: normal  Intervals: normal  ST/T Wave abnormalities: normal  Conduction Disutrbances:none  Narrative Interpretation:   Old EKG Reviewed: unchanged    Joya Gaskins, MD 01/01/12 2008

## 2012-01-01 NOTE — ED Notes (Signed)
Pt up to the bathroom at this time.

## 2012-01-01 NOTE — Anesthesia Postprocedure Evaluation (Signed)
  Anesthesia Post-op Note  Patient: [name]  Procedure(s) Performed:  COLONOSCOPY WITH PROPOFOL - start time 11:37 in cecum 11:45 out 11:59; ESOPHAGOGASTRODUODENOSCOPY (EGD) WITH PROPOFOL - maloney 54 fr dialator; time end 11:31; ESOPHAGEAL DILATION  Patient Location: PACU  Anesthesia Type: MAC  Level of Consciousness: awake, alert  and oriented  Airway and Oxygen Therapy: Patient Spontanous Breathing  Post-op Pain: none  Post-op Assessment: Post-op Vital signs reviewed, Patient's Cardiovascular Status Stable, Respiratory Function Stable and No signs of Nausea or vomiting  Post-op Vital Signs: Reviewed and stable  Complications: No apparent anesthesia complications

## 2012-01-01 NOTE — ED Notes (Signed)
Pt back from x-ray.

## 2012-01-01 NOTE — Transfer of Care (Signed)
Immediate Anesthesia Transfer of Care Note  Patient: [name]  Procedure(s) Performed:  COLONOSCOPY WITH PROPOFOL - start time 11:37 in cecum 11:45; ESOPHAGOGASTRODUODENOSCOPY (EGD) WITH PROPOFOL - maloney 54 fr dialator; time end 11:31; ESOPHAGEAL DILATION  Patient Location: PACU  Anesthesia Type: MAC  Level of Consciousness: awake, alert  and oriented  Airway & Oxygen Therapy: Patient Spontanous Breathing  Post-op Assessment: Report given to PACU RN  Post vital signs: Reviewed and stable  Complications: No apparent anesthesia complications

## 2012-01-02 ENCOUNTER — Inpatient Hospital Stay (HOSPITAL_COMMUNITY)
Admission: EM | Admit: 2012-01-02 | Discharge: 2012-01-06 | DRG: 343 | Disposition: A | Discharge status: Home / Self Care | Payer: Medicare Other | Attending: Pulmonary Disease | Admitting: Pulmonary Disease

## 2012-01-02 ENCOUNTER — Encounter (HOSPITAL_COMMUNITY): Payer: Self-pay | Admitting: Emergency Medicine

## 2012-01-02 DIAGNOSIS — F329 Major depressive disorder, single episode, unspecified: Secondary | ICD-10-CM | POA: Diagnosis present

## 2012-01-02 DIAGNOSIS — R1110 Vomiting, unspecified: Secondary | ICD-10-CM

## 2012-01-02 DIAGNOSIS — K589 Irritable bowel syndrome without diarrhea: Secondary | ICD-10-CM | POA: Diagnosis present

## 2012-01-02 DIAGNOSIS — K36 Other appendicitis: Principal | ICD-10-CM | POA: Diagnosis present

## 2012-01-02 DIAGNOSIS — J45909 Unspecified asthma, uncomplicated: Secondary | ICD-10-CM | POA: Diagnosis present

## 2012-01-02 DIAGNOSIS — Z860100 Personal history of colon polyps, unspecified: Secondary | ICD-10-CM

## 2012-01-02 DIAGNOSIS — IMO0001 Reserved for inherently not codable concepts without codable children: Secondary | ICD-10-CM | POA: Diagnosis present

## 2012-01-02 DIAGNOSIS — F3289 Other specified depressive episodes: Secondary | ICD-10-CM | POA: Diagnosis present

## 2012-01-02 DIAGNOSIS — J4489 Other specified chronic obstructive pulmonary disease: Secondary | ICD-10-CM | POA: Diagnosis present

## 2012-01-02 DIAGNOSIS — F411 Generalized anxiety disorder: Secondary | ICD-10-CM | POA: Diagnosis present

## 2012-01-02 DIAGNOSIS — K219 Gastro-esophageal reflux disease without esophagitis: Secondary | ICD-10-CM | POA: Diagnosis present

## 2012-01-02 DIAGNOSIS — R109 Unspecified abdominal pain: Secondary | ICD-10-CM | POA: Diagnosis present

## 2012-01-02 DIAGNOSIS — R112 Nausea with vomiting, unspecified: Secondary | ICD-10-CM | POA: Diagnosis present

## 2012-01-02 DIAGNOSIS — R1031 Right lower quadrant pain: Secondary | ICD-10-CM | POA: Diagnosis present

## 2012-01-02 DIAGNOSIS — M545 Low back pain, unspecified: Secondary | ICD-10-CM | POA: Diagnosis present

## 2012-01-02 DIAGNOSIS — J449 Chronic obstructive pulmonary disease, unspecified: Secondary | ICD-10-CM | POA: Diagnosis present

## 2012-01-02 LAB — CBC
HCT: 33.9 % — ABNORMAL LOW (ref 36.0–46.0)
MCHC: 35.4 g/dL (ref 30.0–36.0)
MCV: 89.7 fL (ref 78.0–100.0)
RDW: 12.7 % (ref 11.5–15.5)

## 2012-01-02 MED ORDER — ONDANSETRON HCL 4 MG/2ML IJ SOLN
4.0000 mg | Freq: Once | INTRAMUSCULAR | Status: AC
  Administered 2012-01-02: 4 mg via INTRAVENOUS
  Filled 2012-01-02: qty 2

## 2012-01-02 MED ORDER — SODIUM CHLORIDE 0.9 % IV SOLN
Freq: Once | INTRAVENOUS | Status: AC
  Administered 2012-01-02: 20:00:00 via INTRAVENOUS

## 2012-01-02 MED ORDER — ONDANSETRON HCL 4 MG/2ML IJ SOLN
4.0000 mg | Freq: Four times a day (QID) | INTRAMUSCULAR | Status: DC | PRN
  Administered 2012-01-03 – 2012-01-05 (×7): 4 mg via INTRAVENOUS
  Filled 2012-01-02 (×7): qty 2

## 2012-01-02 MED ORDER — METRONIDAZOLE IN NACL 5-0.79 MG/ML-% IV SOLN
500.0000 mg | Freq: Four times a day (QID) | INTRAVENOUS | Status: DC
  Administered 2012-01-03 – 2012-01-05 (×13): 500 mg via INTRAVENOUS
  Filled 2012-01-02 (×15): qty 100

## 2012-01-02 MED ORDER — CYCLOSPORINE 0.05 % OP EMUL
1.0000 [drp] | Freq: Two times a day (BID) | OPHTHALMIC | Status: DC
  Administered 2012-01-03 – 2012-01-05 (×5): 1 [drp] via OPHTHALMIC
  Filled 2012-01-02 (×9): qty 1

## 2012-01-02 MED ORDER — SODIUM CHLORIDE 0.9 % IV SOLN
INTRAVENOUS | Status: DC
  Administered 2012-01-03 – 2012-01-04 (×3): via INTRAVENOUS

## 2012-01-02 MED ORDER — HYDROMORPHONE HCL PF 1 MG/ML IJ SOLN
1.0000 mg | INTRAMUSCULAR | Status: DC | PRN
  Administered 2012-01-03 – 2012-01-06 (×30): 1 mg via INTRAVENOUS
  Filled 2012-01-02 (×30): qty 1

## 2012-01-02 MED ORDER — ONDANSETRON HCL 4 MG PO TABS: 4.0000 mg | ORAL_TABLET | Freq: Four times a day (QID) | ORAL | Status: DC | PRN

## 2012-01-02 MED ORDER — HYDROMORPHONE HCL PF 1 MG/ML IJ SOLN
1.0000 mg | Freq: Once | INTRAMUSCULAR | Status: AC
  Administered 2012-01-02: 1 mg via INTRAVENOUS
  Filled 2012-01-02: qty 1

## 2012-01-02 MED ORDER — PROPOFOL 10 MG/ML IV EMUL
INTRAVENOUS | Status: AC
  Filled 2012-01-02: qty 20

## 2012-01-02 MED ORDER — ACETAMINOPHEN 650 MG RE SUPP: 650.0000 mg | Freq: Four times a day (QID) | RECTAL | Status: DC | PRN

## 2012-01-02 MED ORDER — KETOROLAC TROMETHAMINE 30 MG/ML IJ SOLN
30.0000 mg | Freq: Once | INTRAMUSCULAR | Status: AC
  Administered 2012-01-02: 30 mg via INTRAVENOUS
  Filled 2012-01-02: qty 1

## 2012-01-02 MED ORDER — HYOSCYAMINE SULFATE 0.125 MG SL SUBL
0.1250 mg | SUBLINGUAL_TABLET | SUBLINGUAL | Status: DC | PRN
  Filled 2012-01-02: qty 1

## 2012-01-02 MED ORDER — DIPHENHYDRAMINE HCL 50 MG/ML IJ SOLN
50.0000 mg | Freq: Four times a day (QID) | INTRAMUSCULAR | Status: DC | PRN
  Administered 2012-01-02 – 2012-01-06 (×12): 50 mg via INTRAVENOUS
  Filled 2012-01-02 (×12): qty 1

## 2012-01-02 MED ORDER — OXYCODONE HCL 5 MG PO TABS
5.0000 mg | ORAL_TABLET | ORAL | Status: DC | PRN
  Administered 2012-01-03 – 2012-01-04 (×2): 5 mg via ORAL
  Filled 2012-01-02 (×3): qty 1

## 2012-01-02 MED ORDER — ACETAMINOPHEN 325 MG PO TABS: 650.0000 mg | ORAL_TABLET | Freq: Four times a day (QID) | ORAL | Status: DC | PRN

## 2012-01-02 MED ORDER — PANTOPRAZOLE SODIUM 40 MG IV SOLR
40.0000 mg | INTRAVENOUS | Status: DC
  Administered 2012-01-03: 40 mg via INTRAVENOUS
  Filled 2012-01-02: qty 40

## 2012-01-02 MED ORDER — ENOXAPARIN SODIUM 40 MG/0.4ML ~~LOC~~ SOLN
40.0000 mg | SUBCUTANEOUS | Status: DC
  Administered 2012-01-03 – 2012-01-05 (×4): 40 mg via SUBCUTANEOUS
  Filled 2012-01-02 (×4): qty 0.4

## 2012-01-02 MED ORDER — CIPROFLOXACIN IN D5W 400 MG/200ML IV SOLN
400.0000 mg | Freq: Two times a day (BID) | INTRAVENOUS | Status: DC
  Administered 2012-01-03 – 2012-01-05 (×7): 400 mg via INTRAVENOUS
  Filled 2012-01-02 (×8): qty 200

## 2012-01-02 MED ORDER — ZOLPIDEM TARTRATE 5 MG PO TABS
10.0000 mg | ORAL_TABLET | Freq: Every evening | ORAL | Status: DC | PRN
  Administered 2012-01-03 – 2012-01-04 (×2): 10 mg via ORAL
  Filled 2012-01-02 (×2): qty 2

## 2012-01-02 NOTE — H&P (Signed)
[name] MRN: [id number] DOB/AGE: [birth year] 51 y.o. Primary Care Physician:Jakylan Ron L, MD, MD Admit date: 01/02/2012 Chief Complaint: Abdominal pain HPI: She says she's been having abdominal pain for several months. This is initially been diffuse and it seems to be more in the right lower quadrant recently. She had colonoscopy and EGD yesterday and was found to have several polyps. She says she's not able to keep any food down for several weeks and she has had a 30 pound weight loss. She says that her gastroenterologist mentioned that may be she had something like chronic appendicitis and that she might need a laparoscopy. She has not had any fever and she just had her bowel prep for the colonoscopy and had diarrhea from that but did not have diarrhea previously. She has multiple medical problems as listed below  Past Medical History  Diagnosis Date  . Arthritis   . COPD (chronic obstructive pulmonary disease)   . Bronchitis   . Fibromyalgia   . Interstitial cystitis   . Migraine   . Depression with anxiety   . UTI (lower urinary tract infection)   . RLS (restless legs syndrome)   . Diverticulitis of colon (without mention of hemorrhage)   . Alopecia, unspecified   . Cervicalgia   . Tobacco use disorder   . PTSD (post-traumatic stress disorder)   . IBS (irritable bowel syndrome)   . GERD (gastroesophageal reflux disease)   . Dysphagia   . PONV (postoperative nausea and vomiting)   . Shortness of breath   . Kidney stones   . Anxiety    Past Surgical History  Procedure Date  . Abdominal hysterectomy   . Cholecystectomy   . Tonsillectomy   . Cardiac catheterization   . Cystostomy w/ bladder dilation     x 52 [place]        Family History  Problem Relation Age of Onset  . Coronary artery disease Mother   . Heart attack Mother   . Pulmonary embolism Mother   . Alcohol abuse Father   . Diabetes Father   . COPD Father   . Lung disease Father   . Anesthesia  problems Neg Hx   . Hypotension Neg Hx   . Malignant hyperthermia Neg Hx   . Pseudochol deficiency Neg Hx     Social History:  reports that she has been smoking Cigarettes.  She has a 1.6 pack-year smoking history. She does not have any smokeless tobacco history on file. She reports that she does not drink alcohol or use illicit drugs. She is married and lives at home with her husband  Allergies:  Allergies  Allergen Reactions  . Clarithromycin Itching and Nausea And Vomiting  . Nubain (Nalbuphine Hcl) Other (See Comments)    Deathly sick  . Pseudoephedrine Hives  . Sulfa Antibiotics Nausea And Vomiting  . Tofranil-Pm   . Factive (Gemifloxacin Mesylate) Rash  . Prochlorperazine Edisylate Anxiety  . Promethazine Hcl Anxiety  . Trimethobenzamide Hcl Anxiety    Medications Prior to Admission  Medication Dose Route Frequency Provider Last Rate Last Dose  . 0.9 %  sodium chloride infusion   Intravenous Once [employer]. Colon Branch, MD 75 mL/hr at 01/02/12 2022    . butamben-tetracaine-benzocaine (CETACAINE) spray 1 spray  1 spray Topical Once Laurene Footman, MD   1 spray at 01/01/12 1100  . dexamethasone (DECADRON) injection 4 mg  4 mg Intravenous Once Laurene Footman, MD   4 mg at 01/01/12 1057  . diphenhydrAMINE (BENADRYL)  capsule 25 mg  25 mg Oral Once Joya Gaskins, MD   25 mg at 01/01/12 1935  . glycopyrrolate (ROBINUL) injection 0.2 mg  0.2 mg Intravenous Once Laurene Footman, MD   0.2 mg at 01/01/12 1100  . HYDROmorphone (DILAUDID) injection 1 mg  1 mg Intravenous Once Joya Gaskins, MD   1 mg at 01/01/12 1837  . HYDROmorphone (DILAUDID) injection 1 mg  1 mg Intravenous Once Joya Gaskins, MD   1 mg at 01/01/12 1937  . HYDROmorphone (DILAUDID) injection 1 mg  1 mg Intravenous Once Joya Gaskins, MD   1 mg at 01/01/12 2012  . HYDROmorphone (DILAUDID) injection 1 mg  1 mg Intravenous Once [employer]. Colon Branch, MD   1 mg at 01/02/12 2024  . HYDROmorphone (DILAUDID) injection 1 mg  1  mg Intravenous Once [employer]. Colon Branch, MD   1 mg at 01/02/12 2206  . ketorolac (TORADOL) 30 MG/ML injection 30 mg  30 mg Intravenous Once [employer]. Colon Branch, MD   30 mg at 01/02/12 2022  . ondansetron (ZOFRAN) injection 4 mg  4 mg Intravenous Once Laurene Footman, MD   4 mg at 01/01/12 1101  . ondansetron (ZOFRAN) injection 4 mg  4 mg Intravenous Once Joya Gaskins, MD   4 mg at 01/01/12 2014  . ondansetron (ZOFRAN) injection 4 mg  4 mg Intravenous Once [employer]. Colon Branch, MD   4 mg at 01/02/12 2024  . propofol (DIPRIVAN) 10 MG/ML infusion           . sodium chloride 0.9 % bolus 1,000 mL  1,000 mL Intravenous Once Joya Gaskins, MD   1,000 mL at 01/01/12 1837  . DISCONTD: fentaNYL (SUBLIMAZE) 0.05 MG/ML injection           . DISCONTD: fentaNYL (SUBLIMAZE) injection 25-50 mcg  25-50 mcg Intravenous Q5 min PRN Laurene Footman, MD      . DISCONTD: fentaNYL (SUBLIMAZE) injection    PRN Glynn Octave   50 mcg at 01/01/12 1139  . DISCONTD: glycopyrrolate (ROBINUL) 0.2 MG/ML injection           . DISCONTD: lactated ringers infusion   Intravenous Continuous Laurene Footman, MD 75 mL/hr at 01/01/12 1054 75 mL at 01/01/12 1054  . DISCONTD: midazolam (VERSED) 2 MG/2ML injection           . DISCONTD: midazolam (VERSED) 2 MG/2ML injection           . DISCONTD: midazolam (VERSED) 5 MG/5ML injection    PRN Glynn Octave   2 mg at 01/01/12 1110  . DISCONTD: midazolam (VERSED) injection 1-2 mg  1-2 mg Intravenous Q5 Min x 3 PRN Laurene Footman, MD   2 mg at 01/01/12 1055  . DISCONTD: ondansetron (ZOFRAN) 4 MG/2ML injection           . DISCONTD: ondansetron (ZOFRAN) injection 4 mg  4 mg Intravenous Once PRN Laurene Footman, MD      . DISCONTD: propofol (DIPRIVAN) 10 MG/ML infusion    Continuous PRN Glynn Octave   75 mcg/kg/min at 01/01/12 1152  . DISCONTD: propofol (DIPRIVAN) 10 MG/ML infusion           . DISCONTD: simethicone susp in sterile water 1000 mL irrigation    PRN [name] Ade, MD       Medications Prior to  Admission  Medication Sig Dispense Refill  . ACIPHEX 20 MG tablet Take 20 mg by mouth daily.       Marland Kitchen  ALPRAZolam (XANAX) 1 MG tablet Take 1 mg by mouth at bedtime as needed. For sleep      . amphetamine-dextroamphetamine (ADDERALL) 20 MG tablet Take 1 capsule by mouth Once daily as needed. For attention      . cetirizine (ZYRTEC) 10 MG tablet Take 10 mg by mouth daily.        . cycloSPORINE (RESTASIS) 0.05 % ophthalmic emulsion Place 1 drop into both eyes 2 (two) times daily.        Marland Kitchen estradiol (VIVELLE-DOT) 0.1 MG/24HR Place 1 patch onto the skin 2 (two) times a week.        Marland Kitchen FLUoxetine (PROZAC) 40 MG capsule Take 40 mg by mouth at bedtime.       . hyoscyamine (LEVSIN SL) 0.125 MG SL tablet Take 0.125 mg by mouth every 4 (four) hours as needed. For cramps      . ondansetron (ZOFRAN) 4 MG tablet Take 4 mg by mouth every 8 (eight) hours as needed. For nausea      . oxyCODONE-acetaminophen (PERCOCET) 10-325 MG per tablet Take 1 tablet by mouth every 4 (four) hours as needed. For pain      . pramipexole (MIRAPEX) 0.5 MG tablet Take 0.5 mg by mouth at bedtime.       . topiramate (TOPAMAX) 50 MG tablet Take 25 mg by mouth daily.       . VENTOLIN HFA 108 (90 BASE) MCG/ACT inhaler Inhale 2 puffs into the lungs every 6 (six) hours as needed. For shortness of breath           WJX:BJYNW from the symptoms mentioned above,there are no other symptoms referable to all systems reviewed.  Physical Exam: Blood pressure 124/80, pulse 65, temperature 98.4 F (36.9 C), temperature source Oral, resp. rate 18, height 5\' 2"  (1.575 m), weight 67.132 kg (148 lb), last menstrual period 12/29/1993, SpO2 99.00%. She is awake and alert. She is in mild distress. Her pupils are reactive. Her mucous membranes are slightly dry. Her neck is supple without masses. Her chest is clear. Her heart is regular without murmur gallop or rub. Her abdomen is relatively soft with some tenderness in the right lower quadrant and somewhat  hyperactive bowel sounds. Her extremity showed no edema. Her central nervous system examination shows a she's anxious but otherwise okay    Basename 01/02/12 2015  WBC 9.8  NEUTROABS --  HGB 12.0  HCT 33.9*  MCV 89.7  PLT 316   No results found for this basename: NA:2,K:2,CL:2,CO2:2,GLUCOSE:2,BUN:2,CREATININE:2,CALCIUM:2,MG:2,PHO in the last 72 hourslablast2(ast:2,ALT:2,alkphos:2,bilitot:2,prot:2,albumin:2)@    No results found for this or any previous visit (from the past 240 hour(s)).   Dg Abd Acute W/chest  01/01/2012  *RADIOLOGY REPORT*  Clinical Data: The patient had EGD and colonoscopy today.  Eight polyps were removed.  History of chronic abdominal pain, hysterectomy.  ACUTE ABDOMEN SERIES (ABDOMEN 2 VIEW & CHEST 1 VIEW)  Comparison: CT of the abdomen and pelvis 04/05/2011, chest x-ray 08/28/2011  Findings: Cardiomediastinal silhouette is within normal limits. The lungs are free of focal consolidations and pleural effusions. No edema.  Bowel gas pattern is nonobstructive.  Surgical clips are present in the right upper quadrant of the abdomen.  Multiple calcifications in the pelvis are likely phleboliths. Visualized osseous structures have a normal appearance.  IMPRESSION:  1. No evidence for acute cardiopulmonary abnormality. 2.  Nonobstructive bowel gas pattern.  Original Report Authenticated By: Patterson Hammersmith, M.D.   Impression: She has had abdominal pain and she's  has been going on for some time. She's had weight loss. She just had EGD and colonoscopy which I do not think showed a cause for her problem. There is some question about some sort of a chronic appendicitis. Active Problems:  * No active hospital problems. *      Plan: I will have her admitted started on IV fluids since she's not been able to keep anything down she'll have IV Zofran and IV hydromorphone for pain. I will ask for both gastroenterology and general surgery consultation. She will have a CT the abdomen  and pelvis in the morning      Lynnell Fiumara L Pager [phone]  01/02/2012, 10:08 PM

## 2012-01-02 NOTE — ED Notes (Signed)
300 unit called, unable to give report as RN is at lunch.

## 2012-01-02 NOTE — ED Provider Notes (Signed)
History   This chart was scribed for [employer]. Colon Branch, MD scribed by Magnus Sinning. The patient was seen in room APA12/APA12  Seen at 19:37.   CSN: [id number]  Arrival date & time 01/02/12  1859   First MD Initiated Contact with Patient 01/02/12 1919      Chief Complaint  Patient presents with  . Emesis    (Consider location/radiation/quality/duration/timing/severity/associated sxs/prior treatment) HPI [name] is a 51 y.o. female who presents to the Emergency Department complaining of persistent moderate emesis with associated right side abd pain and lower back pain, onset yesterday. Pt was seen yesterday for vomiting and abd pain after having a colonoscopy and an EGD yesterday.She states that she has been unable to keep anything down since yesterday and so she called her PCP and he advised her to report to the ED.  Pt has hx of bladder polyps and chronic abd pain with questionable diverticulosis  PCP: Dr. Juanetta Gosling Gastroenterologist: Dr. Kendell Bane Past Medical History  Diagnosis Date  . Arthritis   . COPD (chronic obstructive pulmonary disease)   . Bronchitis   . Fibromyalgia   . Interstitial cystitis   . Migraine   . Depression with anxiety   . UTI (lower urinary tract infection)   . RLS (restless legs syndrome)   . Diverticulitis of colon (without mention of hemorrhage)   . Alopecia, unspecified   . Cervicalgia   . Tobacco use disorder   . PTSD (post-traumatic stress disorder)   . IBS (irritable bowel syndrome)   . GERD (gastroesophageal reflux disease)   . Dysphagia   . PONV (postoperative nausea and vomiting)   . Shortness of breath   . Kidney stones   . Anxiety     Past Surgical History  Procedure Date  . Abdominal hysterectomy   . Cholecystectomy   . Tonsillectomy   . Cardiac catheterization   . Cystostomy w/ bladder dilation     x 52 [place]    Family History  Problem Relation Age of Onset  . Coronary artery disease Mother   . Heart attack Mother     . Pulmonary embolism Mother   . Alcohol abuse Father   . Diabetes Father   . COPD Father   . Lung disease Father   . Anesthesia problems Neg Hx   . Hypotension Neg Hx   . Malignant hyperthermia Neg Hx   . Pseudochol deficiency Neg Hx     History  Substance Use Topics  . Smoking status: Current Everyday Smoker -- 0.2 packs/day for 8 years    Types: Cigarettes  . Smokeless tobacco: Not on file  . Alcohol Use: No    OB History    Grav Para Term Preterm Abortions TAB SAB Ect Mult Living   1 1 1       1       Review of Systems 10 Systems reviewed and are negative for acute change except as noted in the HPI. Allergies  Clarithromycin; Nubain; Pseudoephedrine; Sulfa antibiotics; Tofranil-pm; Factive; Prochlorperazine edisylate; Promethazine hcl; and Trimethobenzamide hcl  Home Medications   Current Outpatient Rx  Name Route Sig Dispense Refill  . ACIPHEX 20 MG PO TBEC Oral Take 20 mg by mouth daily.     Marland Kitchen ALPRAZOLAM 1 MG PO TABS Oral Take 1 mg by mouth at bedtime as needed. For sleep    . AMPHETAMINE-DEXTROAMPHETAMINE 20 MG PO TABS Oral Take 1 capsule by mouth Once daily as needed. For attention    . CETIRIZINE  HCL 10 MG PO TABS Oral Take 10 mg by mouth daily.      . CYCLOSPORINE 0.05 % OP EMUL Both Eyes Place 1 drop into both eyes 2 (two) times daily.      Marland Kitchen ESTRADIOL 0.1 MG/24HR TD PTTW Transdermal Place 1 patch onto the skin 2 (two) times a week.      Marland Kitchen FLUOXETINE HCL 40 MG PO CAPS Oral Take 40 mg by mouth at bedtime.     Marland Kitchen HYOSCYAMINE SULFATE 0.125 MG SL SUBL Oral Take 0.125 mg by mouth every 4 (four) hours as needed. For cramps    . ONDANSETRON HCL 4 MG PO TABS Oral Take 4 mg by mouth every 8 (eight) hours as needed. For nausea    . OXYCODONE-ACETAMINOPHEN 10-325 MG PO TABS Oral Take 1 tablet by mouth every 4 (four) hours as needed. For pain    . PEG-KCL-NACL-NASULF-NA ASC-C 100 G PO SOLR Oral Take 1 kit (100 g total) by mouth once. As directed Please purchase 1 Fleets  enema to use with the prep 1 kit 0  . PRAMIPEXOLE DIHYDROCHLORIDE 0.5 MG PO TABS Oral Take 0.5 mg by mouth at bedtime.     . TOPIRAMATE 50 MG PO TABS Oral Take 25 mg by mouth daily.     . VENTOLIN HFA 108 (90 BASE) MCG/ACT IN AERS Inhalation Inhale 2 puffs into the lungs every 6 (six) hours as needed. For shortness of breath      BP 141/86  Pulse 75  Temp(Src) 98.4 F (36.9 C) (Oral)  Resp 18  Ht 5\' 2"  (1.575 m)  Wt 148 lb (67.132 kg)  BMI 27.07 kg/m2  SpO2 100%  LMP 12/29/1993  Physical Exam  Nursing note and vitals reviewed. Constitutional: She is oriented to person, place, and time. She appears well-developed and well-nourished. No distress.  HENT:  Head: Normocephalic and atraumatic.  Mouth/Throat: Oropharynx is clear and moist.  Eyes: EOM are normal. Pupils are equal, round, and reactive to light.  Neck: Neck supple. No tracheal deviation present.  Cardiovascular: Normal rate, regular rhythm and normal heart sounds.   Pulmonary/Chest: Effort normal and breath sounds normal. No respiratory distress.  Abdominal: Soft. Bowel sounds are normal. She exhibits no distension. There is no tenderness. There is no rebound and no guarding.  Musculoskeletal: Normal range of motion. She exhibits no edema.  Neurological: She is alert and oriented to person, place, and time. No sensory deficit.  Skin: Skin is warm and dry.  Psychiatric: She has a normal mood and affect. Her behavior is normal.    ED Course  Procedures (including critical care time) DIAGNOSTIC STUDIES: Oxygen Saturation is 100% on room air, normal by my interpretation.    COORDINATION OF CARE: 21:07: Physician performs recheck and patient reports that she is feeling better, but that the pain is beginning to return. Physician discusses results with the patient and provides plans of actions for the patient's consideration at this time. Patient explains that she will see how she feels and will agree to an appropriate plan.    21:37: Physician discusses recheck and results with Dr. Juanetta Gosling. Dr. Juanetta Gosling is coming to see the patient. Results for orders placed during the hospital encounter of 01/02/12  CBC      Component Value Range   WBC 9.8  4.0 - 10.5 (K/uL)   RBC 3.78 (*) 3.87 - 5.11 (MIL/uL)   Hemoglobin 12.0  12.0 - 15.0 (g/dL)   HCT 16.1 (*) 09.6 - 46.0 (%)  MCV 89.7  78.0 - 100.0 (fL)   MCH 31.7  26.0 - 34.0 (pg)   MCHC 35.4  30.0 - 36.0 (g/dL)   RDW 16.1  09.6 - 04.5 (%)   Platelets 316  150 - 400 (K/uL)   Dg Abd Acute W/chest  01/01/2012  *RADIOLOGY REPORT*  Clinical Data: The patient had EGD and colonoscopy today.  Eight polyps were removed.  History of chronic abdominal pain, hysterectomy.  ACUTE ABDOMEN SERIES (ABDOMEN 2 VIEW & CHEST 1 VIEW)  Comparison: CT of the abdomen and pelvis 04/05/2011, chest x-ray 08/28/2011  Findings: Cardiomediastinal silhouette is within normal limits. The lungs are free of focal consolidations and pleural effusions. No edema.  Bowel gas pattern is nonobstructive.  Surgical clips are present in the right upper quadrant of the abdomen.  Multiple calcifications in the pelvis are likely phleboliths. Visualized osseous structures have a normal appearance.  IMPRESSION:  1. No evidence for acute cardiopulmonary abnormality. 2.  Nonobstructive bowel gas pattern.  Original Report Authenticated By: Patterson Hammersmith, M.D.   No diagnosis found.    MDM  Patient with h/o chronic RLQ pain who has had EGD and colonoscopy yesterday with significant continued RLQ pain, nausea and vomiting. She was seen in the ER yesterday after the procedure for similar c/o. Reviewed pre op labs. Obtained CBC which was normal. Given IVF, analgesic x 2, antiemetic.She felt a little better but pain began to return. Will arrange admission with PCP, Dr. Juanetta Gosling.Pt stable in ED with no significant deterioration in condition.The patient appears reasonably stabilized for admission considering the current resources,  flow, and capabilities available in the ED at this time, and I doubt any other [facility] requiring further screening and/or treatment in the ED prior to admission.  I personally performed the services described in this documentation, which was scribed in my presence. The recorded information has been reviewed and considered.  MDM Reviewed: previous chart, nursing note and vitals Reviewed previous: labs (EDG and colooscopy reports) Interpretation: labs Total time providing critical care: 35.         Nicoletta Dress. Colon Branch, MD 01/02/12 2215

## 2012-01-02 NOTE — ED Notes (Signed)
Patient had colonoscopy yesterday; was seen in ER last night for vomiting and abdominal pain.  Patient states has had continued vomiting today.  States spoke with Dr. Juanetta Gosling and he told her to come to ER.

## 2012-01-02 NOTE — ED Notes (Signed)
Pt  States has had diarrhea and abd pain x 4 months. Pt has had polyp removal a "couple of days ago and is now having increased pain. Pt reports Dr [name] instructed pt to come to hospital for possible appendix.  Pt reports being nauseated with last time vomited was at 0700 this morning.

## 2012-01-03 ENCOUNTER — Inpatient Hospital Stay (HOSPITAL_COMMUNITY): Payer: Medicare Other

## 2012-01-03 DIAGNOSIS — R112 Nausea with vomiting, unspecified: Secondary | ICD-10-CM | POA: Diagnosis present

## 2012-01-03 DIAGNOSIS — Z9889 Other specified postprocedural states: Secondary | ICD-10-CM

## 2012-01-03 DIAGNOSIS — R1031 Right lower quadrant pain: Secondary | ICD-10-CM

## 2012-01-03 LAB — CBC
HCT: 33 % — ABNORMAL LOW (ref 36.0–46.0)
MCH: 31.3 pg (ref 26.0–34.0)
MCHC: 34.8 g/dL (ref 30.0–36.0)
Platelets: 314 10*3/uL (ref 150–400)
Platelets: 292 10*3/uL (ref 150–400)
RBC: 3.55 MIL/uL — ABNORMAL LOW (ref 3.87–5.11)
RDW: 12.9 % (ref 11.5–15.5)
WBC: 8.6 10*3/uL (ref 4.0–10.5)

## 2012-01-03 LAB — COMPREHENSIVE METABOLIC PANEL
ALT: 10 U/L (ref 0–35)
Alkaline Phosphatase: 55 U/L (ref 39–117)
BUN: 5 mg/dL — ABNORMAL LOW (ref 6–23)
CO2: 25 mEq/L (ref 19–32)
Chloride: 106 mEq/L (ref 96–112)
GFR calc Af Amer: >90 mL/min (ref >90)
GFR calc non Af Amer: >90 mL/min (ref >90)
Glucose, Bld: 85 mg/dL (ref 70–99)
Potassium: 3.6 mEq/L (ref 3.5–5.1)
Sodium: 137 mEq/L (ref 135–145)
Total Bilirubin: 0.1 mg/dL — ABNORMAL LOW (ref 0.3–1.2)
Total Protein: 6.3 g/dL (ref 6.0–8.3)

## 2012-01-03 MED ORDER — SODIUM CHLORIDE 0.9 % IJ SOLN
INTRAMUSCULAR | Status: AC
  Administered 2012-01-03: 10 mL
  Filled 2012-01-03: qty 3

## 2012-01-03 MED ORDER — HYOSCYAMINE SULFATE 0.125 MG SL SUBL
0.1250 mg | SUBLINGUAL_TABLET | Freq: Three times a day (TID) | SUBLINGUAL | Status: DC
  Administered 2012-01-03 – 2012-01-06 (×6): 0.125 mg via ORAL
  Filled 2012-01-03 (×12): qty 1

## 2012-01-03 MED ORDER — CIPROFLOXACIN IN D5W 400 MG/200ML IV SOLN
INTRAVENOUS | Status: AC
  Filled 2012-01-03: qty 200

## 2012-01-03 MED ORDER — METRONIDAZOLE IN NACL 5-0.79 MG/ML-% IV SOLN
INTRAVENOUS | Status: AC
  Filled 2012-01-03: qty 200

## 2012-01-03 MED ORDER — PANTOPRAZOLE SODIUM 40 MG IV SOLR
40.0000 mg | Freq: Two times a day (BID) | INTRAVENOUS | Status: DC
  Administered 2012-01-03 – 2012-01-06 (×6): 40 mg via INTRAVENOUS
  Filled 2012-01-03 (×6): qty 40

## 2012-01-03 MED ORDER — SODIUM CHLORIDE 0.9 % IJ SOLN
INTRAMUSCULAR | Status: AC
  Administered 2012-01-03: 15:00:00
  Filled 2012-01-03: qty 3

## 2012-01-03 MED ORDER — IOHEXOL 300 MG/ML  SOLN
100.0000 mL | Freq: Once | INTRAMUSCULAR | Status: AC | PRN
  Administered 2012-01-03: 100 mL via INTRAVENOUS

## 2012-01-03 MED ORDER — LORAZEPAM 2 MG/ML IJ SOLN
1.0000 mg | INTRAMUSCULAR | Status: DC | PRN
  Administered 2012-01-03 – 2012-01-05 (×7): 1 mg via INTRAVENOUS
  Administered 2012-01-06: 02:00:00 via INTRAVENOUS
  Administered 2012-01-06: 1 mg via INTRAVENOUS
  Filled 2012-01-03 (×9): qty 1

## 2012-01-03 MED ORDER — INFLUENZA VIRUS VACC SPLIT PF IM SUSP
0.5000 mL | INTRAMUSCULAR | Status: AC
  Administered 2012-01-04: 0.5 mL via INTRAMUSCULAR
  Filled 2012-01-03: qty 0.5

## 2012-01-03 NOTE — Consult Note (Signed)
Reason for consultation; acute on chronic RLQ abdominal pain in a patient who is status post EGD and colonoscopy with polypectomy on 01/01/2012. History of present illness; Patient is 51 year old Caucasian female with multiple medical problems who was recently evaluated at [facility] for nausea, vomiting, dysphagia, chronic diarrhea 30 pound weight loss and predominantly right-sided abdominal pain. Her stool studies were negative. Celiac antibody panel was also negative. She has been diagnosed with irritable bowel syndrome not responding to therapy. She underwent esophagogastroduodenoscopy and colonoscopy by Dr. Roetta Sessions on 01/01/2012. EGD revealed gastric polyps at fundus; on biopsy these are fundic gland polyps. She also had duodenal biopsy revealing normal morphology. Also had single duodenal diverticulum. Her esophagus is also dilated with 54 [place] dilator. Colonoscopy revealed multiple polyps all of which were hot or cold snared. Largest.related polyp was in the sigmoid colon but 15 mm in diameter. All in all there were 7 polyps. Only 2 of these polyps turn out to be adenomatous. 6 mm polyp at ascending colon was tubular adenoma. 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 She also had random biopsy from proximal colon which revealed normal histology and no evidence of collagenous or microscopic colitis. Patient returned to the emergency room one day after these procedures complaining of severe pain in right lower quadrant along with nausea and vomiting. She reported a temp of 100 however one day prior to her procedure she had a temp of 102. She also noted bloating but denies rectal bleeding or melena. Patient described this pain to be worse than her chronic pain. She was evaluated by ER physician and admitted to Dr. Juanetta Gosling service. She had abdominopelvic CT this morning which did not show any  significant finding. I have reviewed CT films and result is under lab data. This morning she feels some better. She is tolerating clear liquids. She is on Dilaudid which she takes every 2 hours. Patient has been on chronic narcotic therapy for abdominal pain as well as pain from her fibromyalgia on regular basis. She denies dysuria, hematuria or vaginal discharge. She complains of hair loss which started several months ago Current medications; Ciprofloxacin 400 mg IV every 12 hours. Metronidazole 500 mg IV every 6 hours Hyoscyamine SL1 tablet sublingual every 4 hours as needed Hydromorphone 1 mg IV every 8 2 when necessary pain. Enoxaparin 40 mg subcutaneous every 24 hours. Pantoprazole 40 mg IV every 24 hours When necessary medications include acetaminophen, diphenhydramine, lorazepam, and Ondansetron, oxycodone and zolpidem. Home meds; AcipHex, alprazolam, Adderall, Zyrtec, cyclosporin ophthalmic emulsion, estradiol patch 0.1 mg every 24 hours, fluoxetine, hyoscyamine, ondansetron, oxycodone/ acetaminophen(10/325 one tablet every 4 hours as needed), pramipexole, topiramate and Ventolin inhaler. Past medical history; Chronic GERD. Irritable bowel syndrome. Chronic abdominal pain. Colonic adenomas removed 2 days ago as above. COPD; patient continues to smoke. Rheumatoid arthritis diagnosed 4 years ago via physician at [facility] currently on no therapy. Fibromyalgia. History of migraine. Interstitial cystitis; she's had urethral dilation  52 times. Bipolar disorder(anxiety and depression). Restless leg syndrome. History of endometriosis. BSO with hysterectomy. Cholecystectomy in 1996 for cholelithiasis. Tonsillectomy. Allergies; She is allergic to Clarithromycin, Nubain, Pseudoephedrine, Sulfa, Tofranil, Prochlorpromazine, Promethazine,  Trimethyl benzamide and Factive. Social history; she is married; she is an EMT but now disabled; she smokes one pack of cigarettes per day does not drink  alcohol. Family history; negative for IBD or CRC. Objective; BP 127/70  Pulse 75  Temp(Src) 98 F (  36.7 C) (Oral)  Resp 16  Ht 5\' 2"  (1.575 m)  Wt 148 lb 11.2 oz (67.45 kg)  BMI 27.20 kg/m2  SpO2 98%  LMP 12/29/1993 Conjunctiva is pink. Sclerae nonicteric. Oropharyngeal mucosa is normal. No neck masses or thyromegaly noted. Cardiac exam with regular rhythm normal S1 and S2. No murmur or gallop noted. Lungs are clear to  Auscultation. Abdomen is symmetrical. I'll sounds are normal. No bruits noted. She has a small area with ecchymosis in left midabdomen initiate Lovenox injection. She has moderate tenderness in right lower quadrant with rebound no guarding noted. She has mild tenderness in right upper quadrant as well as hypogastric region and LLQ. No organomegaly or masses noted. Rectal examination deferred. No LE edema or clubbing noted. Lab data; Abdominopelvic CT reviewed; study performed with oral and IV contrast. No evidence of ascites or pneumoperitoneum. She is status post cholecystectomy and hysterectomy. Incidental finding a small air pocket in left mid abdominal wall site of Lovenox injection. No evidence of colonic wall thickening. CBC from 01/01/2011 WBC 9.8 hemoglobin 12 hematocrit 33.9 platelet count 316K. WBC from this morning 8.6. Serum sodium 137 potassium 3.6, chloride of 106, CO2 25, glucose 85, BUN 5, creatinine 0.72, bilirubin 0.1, AP 55, AST 17, ALT 10, total protein 6.3 albumin 3.5 and calcium 9.1. Assessment; Acute on chronic right-sided abdominal pain may be secondary to irritable bowel syndrome triggered with colonoscopy( insufflation and manipulation). She could also have post-polypectomy syndrome however she does not have leukocytosis or fever. No evidence to suggest colitis or appendicitis. In this setting symptomatic therapy would be appropriate with close monitoring of her symptoms. Agree with idea of surgical consultation as there has been question of "chronic  appendicitis" . Recommendations; CBC in a.m. Changed hyoscyamine schedule to before each meal. Increase pantoprazole to 40 mg twice daily. Advanced diet to full liquids. Await Dr. Lovell Sheehan opinion. Please note that I have reviewed all the biopsy findings with the patient. I will reevaluate patient in a.m. and [facility] team will assume her care on 01/05/2012

## 2012-01-03 NOTE — Progress Notes (Signed)
Subjective: She continues to complain of nausea. She also still has right lower quadrant abdominal pain. She has no other new complaints. She is set for CT of the abdomen today.  Objective: Vital signs in last 24 hours: Temp:  [98 F (36.7 C)-98.4 F (36.9 C)] 98 F (36.7 C) (01/05 1610) Pulse Rate:  [65-75] 75  (01/05 0608) Resp:  [16-18] 16  (01/05 0608) BP: (124-141)/(70-86) 127/70 mmHg (01/05 0608) SpO2:  [98 %-100 %] 98 % (01/05 0608) Weight:  [67.132 kg (148 lb)-67.45 kg (148 lb 11.2 oz)] 148 lb 11.2 oz (67.45 kg) (01/05 0027) Weight change:  Last BM Date: 01/02/12  Intake/Output from previous day: 01/04 0701 - 01/05 0700 In: 1027 [I.V.:727; IV Piggyback:300] Out: -   PHYSICAL EXAM General appearance: alert, cooperative and mild distress Resp: clear to auscultation bilaterally Cardio: regular rate and rhythm, S1, S2 normal, no murmur, click, rub or gallop GI: Mildly diffusely tender with active bowel sounds Extremities: extremities normal, atraumatic, no cyanosis or edema  Lab Results:    Basic Metabolic Panel:  Basename 01/03/12  NA 137  K 3.6  CL 106  CO2 25  GLUCOSE 85  BUN 5*  CREATININE 0.72  CALCIUM 9.1  MG --  PHOS --   Liver Function Tests:  Basename 01/03/12  AST 17  ALT 10  ALKPHOS 55  BILITOT 0.1*  PROT 6.3  ALBUMIN 3.5   No results found for this basename: LIPASE:2,AMYLASE:2 in the last 72 hours No results found for this basename: AMMONIA:2 in the last 72 hours CBC:  Basename 01/03/12 0630 01/02/12 2015  WBC 8.6 9.8  NEUTROABS -- --  HGB 11.5* 12.0  HCT 33.0* 33.9*  MCV 90.7 89.7  PLT 314 316   Cardiac Enzymes: No results found for this basename: CKTOTAL:3,CKMB:3,CKMBINDEX:3,TROPONINI:3 in the last 72 hours BNP: No results found for this basename: PROBNP:3 in the last 72 hours D-Dimer: No results found for this basename: DDIMER:2 in the last 72 hours CBG: No results found for this basename: GLUCAP:6 in the last 72  hours Hemoglobin A1C: No results found for this basename: HGBA1C in the last 72 hours Fasting Lipid Panel: No results found for this basename: CHOL,HDL,LDLCALC,TRIG,CHOLHDL,LDLDIRECT in the last 72 hours Thyroid Function Tests: No results found for this basename: TSH,T4TOTAL,FREET4,T3FREE,THYROIDAB in the last 72 hours Anemia Panel: No results found for this basename: VITAMINB12,FOLATE,FERRITIN,TIBC,IRON,RETICCTPCT in the last 72 hours Coagulation: No results found for this basename: LABPROT:2,INR:2 in the last 72 hours Urine Drug Screen: Drugs of Abuse     Component Value Date/Time   LABOPIA POSITIVE* 04/23/2009 1637   COCAINSCRNUR NONE DETECTED 04/23/2009 1637   LABBENZ POSITIVE* 04/23/2009 1637   AMPHETMU NONE DETECTED 04/23/2009 1637   THCU NONE DETECTED 04/23/2009 1637   LABBARB  Value: NONE DETECTED        DRUG SCREEN FOR MEDICAL PURPOSES ONLY.  IF CONFIRMATION IS NEEDED FOR ANY PURPOSE, NOTIFY LAB WITHIN 5 DAYS.        LOWEST DETECTABLE LIMITS FOR URINE DRUG SCREEN Drug Class       Cutoff (ng/mL) Amphetamine      1000 Barbiturate      200 Benzodiazepine   200 Tricyclics       300 Opiates          300 Cocaine          300 THC              50 04/23/2009 1637    Alcohol Level: No results  found for this basename: ETH:2 in the last 72 hours Urinalysis:  Misc. Labs:  ABGS No results found for this basename: PHART,PCO2,PO2ART,TCO2,HCO3 in the last 72 hours CULTURES No results found for this or any previous visit (from the past 240 hour(s)). Studies/Results: Dg Abd Acute W/chest  01/01/2012  *RADIOLOGY REPORT*  Clinical Data: The patient had EGD and colonoscopy today.  Eight polyps were removed.  History of chronic abdominal pain, hysterectomy.  ACUTE ABDOMEN SERIES (ABDOMEN 2 VIEW & CHEST 1 VIEW)  Comparison: CT of the abdomen and pelvis 04/05/2011, chest x-ray 08/28/2011  Findings: Cardiomediastinal silhouette is within normal limits. The lungs are free of focal consolidations and pleural  effusions. No edema.  Bowel gas pattern is nonobstructive.  Surgical clips are present in the right upper quadrant of the abdomen.  Multiple calcifications in the pelvis are likely phleboliths. Visualized osseous structures have a normal appearance.  IMPRESSION:  1. No evidence for acute cardiopulmonary abnormality. 2.  Nonobstructive bowel gas pattern.  Original Report Authenticated By: Patterson Hammersmith, M.D.    Medications:  Prior to Admission:  Prescriptions prior to admission  Medication Sig Dispense Refill  . ACIPHEX 20 MG tablet Take 20 mg by mouth daily.       Marland Kitchen ALPRAZolam (XANAX) 1 MG tablet Take 1 mg by mouth at bedtime as needed. For sleep      . amphetamine-dextroamphetamine (ADDERALL) 20 MG tablet Take 1 capsule by mouth Once daily as needed. For attention      . cetirizine (ZYRTEC) 10 MG tablet Take 10 mg by mouth daily.        . cycloSPORINE (RESTASIS) 0.05 % ophthalmic emulsion Place 1 drop into both eyes 2 (two) times daily.        Marland Kitchen estradiol (VIVELLE-DOT) 0.1 MG/24HR Place 1 patch onto the skin 2 (two) times a week.        Marland Kitchen FLUoxetine (PROZAC) 40 MG capsule Take 40 mg by mouth at bedtime.       . hyoscyamine (LEVSIN SL) 0.125 MG SL tablet Take 0.125 mg by mouth every 4 (four) hours as needed. For cramps      . ondansetron (ZOFRAN) 4 MG tablet Take 4 mg by mouth every 8 (eight) hours as needed. For nausea      . oxyCODONE-acetaminophen (PERCOCET) 10-325 MG per tablet Take 1 tablet by mouth every 4 (four) hours as needed. For pain      . pramipexole (MIRAPEX) 0.5 MG tablet Take 0.5 mg by mouth at bedtime.       . topiramate (TOPAMAX) 50 MG tablet Take 25 mg by mouth daily.       . VENTOLIN HFA 108 (90 BASE) MCG/ACT inhaler Inhale 2 puffs into the lungs every 6 (six) hours as needed. For shortness of breath       Scheduled:   . sodium chloride   Intravenous Once  . ciprofloxacin  400 mg Intravenous Q12H  . cycloSPORINE  1 drop Both Eyes BID  . enoxaparin  40 mg  Subcutaneous Q24H  . HYDROmorphone  1 mg Intravenous Once  .  HYDROmorphone (DILAUDID) injection  1 mg Intravenous Once  . influenza  inactive virus vaccine  0.5 mL Intramuscular Tomorrow-1000  . ketorolac  30 mg Intravenous Once  . metronidazole  500 mg Intravenous Q6H  . ondansetron  4 mg Intravenous Once  . pantoprazole (PROTONIX) IV  40 mg Intravenous Q24H   Continuous:   . sodium chloride 125 mL/hr at 01/03/12 0027  Assesment: She has abdominal pain which is been chronic. It is now more localized in the right lower quadrant. She's had significant nausea. She is set for CT of the abdomen and pelvis today. She may have chronic appendicitis and will have surgical consultation Active Problems:  * No active hospital problems. *     Plan: Continue his treatments I added some Ativan through the vein to see if it will help with her anxiety and allow her to rest    LOS: 1 day   [name] L 01/03/2012, 10:18 AM

## 2012-01-04 MED ORDER — FLUOXETINE HCL 20 MG PO CAPS
40.0000 mg | ORAL_CAPSULE | Freq: Every day | ORAL | Status: DC
  Administered 2012-01-04 – 2012-01-06 (×2): 40 mg via ORAL
  Filled 2012-01-04 (×2): qty 1
  Filled 2012-01-04: qty 2

## 2012-01-04 MED ORDER — PRAMIPEXOLE DIHYDROCHLORIDE 1 MG PO TABS
0.5000 mg | ORAL_TABLET | ORAL | Status: DC
  Administered 2012-01-04 – 2012-01-05 (×2): 0.5 mg via ORAL
  Filled 2012-01-04 (×2): qty 1

## 2012-01-04 MED ORDER — TOPIRAMATE 25 MG PO TABS
25.0000 mg | ORAL_TABLET | Freq: Two times a day (BID) | ORAL | Status: DC
  Administered 2012-01-04 – 2012-01-06 (×4): 25 mg via ORAL
  Filled 2012-01-04 (×7): qty 1

## 2012-01-04 MED ORDER — AMPHETAMINE-DEXTROAMPHETAMINE 10 MG PO TABS
20.0000 mg | ORAL_TABLET | Freq: Every day | ORAL | Status: DC
  Filled 2012-01-04: qty 2

## 2012-01-04 MED ORDER — SODIUM CHLORIDE 0.9 % IJ SOLN
INTRAMUSCULAR | Status: AC
  Administered 2012-01-04: 10 mL
  Filled 2012-01-04: qty 3

## 2012-01-04 NOTE — Progress Notes (Signed)
Subjective: She continues to have abdominal pain but seems a little bit less nauseated. They help from Dr. Lovell Sheehan and Karilyn Cota is appreciated. She is scheduled for laparoscopic appendectomy tomorrow  Objective: Vital signs in last 24 hours: Temp:  [97.2 F (36.2 C)-98 F (36.7 C)] 97.2 F (36.2 C) (01/06 0617) Pulse Rate:  [68-85] 85  (01/06 0617) Resp:  [16-18] 16  (01/06 0617) BP: (117-144)/(71-85) 117/71 mmHg (01/06 0617) SpO2:  [95 %-98 %] 95 % (01/06 0617) Weight change:  Last BM Date: 01/04/12  Intake/Output from previous day: 01/05 0701 - 01/06 0700 In: 4342.5 [P.O.:480; I.V.:2962.5; IV Piggyback:900] Out: -   PHYSICAL EXAM General appearance: alert, cooperative and mild distress Resp: clear to auscultation bilaterally Cardio: regular rate and rhythm, S1, S2 normal, no murmur, click, rub or gallop GI: soft, non-tender; bowel sounds normal; no masses,  no organomegaly Extremities: extremities normal, atraumatic, no cyanosis or edema  Lab Results:    Basic Metabolic Panel:  Basename 01/03/12  NA 137  K 3.6  CL 106  CO2 25  GLUCOSE 85  BUN 5*  CREATININE 0.72  CALCIUM 9.1  MG --  PHOS --   Liver Function Tests:  Basename 01/03/12  AST 17  ALT 10  ALKPHOS 55  BILITOT 0.1*  PROT 6.3  ALBUMIN 3.5   No results found for this basename: LIPASE:2,AMYLASE:2 in the last 72 hours No results found for this basename: AMMONIA:2 in the last 72 hours CBC:  Basename 01/03/12 1230 01/03/12 0630  WBC 7.9 8.6  NEUTROABS -- --  HGB 11.1* 11.5*  HCT 32.4* 33.0*  MCV 91.3 90.7  PLT 292 314   Cardiac Enzymes: No results found for this basename: CKTOTAL:3,CKMB:3,CKMBINDEX:3,TROPONINI:3 in the last 72 hours BNP: No results found for this basename: PROBNP:3 in the last 72 hours D-Dimer: No results found for this basename: DDIMER:2 in the last 72 hours CBG: No results found for this basename: GLUCAP:6 in the last 72 hours Hemoglobin A1C: No results found for this  basename: HGBA1C in the last 72 hours Fasting Lipid Panel: No results found for this basename: CHOL,HDL,LDLCALC,TRIG,CHOLHDL,LDLDIRECT in the last 72 hours Thyroid Function Tests: No results found for this basename: TSH,T4TOTAL,FREET4,T3FREE,THYROIDAB in the last 72 hours Anemia Panel: No results found for this basename: VITAMINB12,FOLATE,FERRITIN,TIBC,IRON,RETICCTPCT in the last 72 hours Coagulation: No results found for this basename: LABPROT:2,INR:2 in the last 72 hours Urine Drug Screen: Drugs of Abuse     Component Value Date/Time   LABOPIA POSITIVE* 04/23/2009 1637   COCAINSCRNUR NONE DETECTED 04/23/2009 1637   LABBENZ POSITIVE* 04/23/2009 1637   AMPHETMU NONE DETECTED 04/23/2009 1637   THCU NONE DETECTED 04/23/2009 1637   LABBARB  Value: NONE DETECTED        DRUG SCREEN FOR MEDICAL PURPOSES ONLY.  IF CONFIRMATION IS NEEDED FOR ANY PURPOSE, NOTIFY LAB WITHIN 5 DAYS.        LOWEST DETECTABLE LIMITS FOR URINE DRUG SCREEN Drug Class       Cutoff (ng/mL) Amphetamine      1000 Barbiturate      200 Benzodiazepine   200 Tricyclics       300 Opiates          300 Cocaine          300 THC              50 04/23/2009 1637    Alcohol Level: No results found for this basename: ETH:2 in the last 72 hours Urinalysis:  Misc. Labs:  ABGS  No results found for this basename: PHART,PCO2,PO2ART,TCO2,HCO3 in the last 72 hours CULTURES No results found for this or any previous visit (from the past 240 hour(s)). Studies/Results: Ct Abdomen Pelvis W Contrast  01/03/2012  *RADIOLOGY REPORT*  Clinical Data: Right abdominal pain, recent colonoscopy  CT ABDOMEN AND PELVIS WITH CONTRAST  Technique:  Multidetector CT imaging of the abdomen and pelvis was performed following the standard protocol during bolus administration of intravenous contrast.  Contrast: OMNIPAQUE IOHEXOL 300 MG/ML IV SOLN  Comparison: 04/05/2011  Findings: Lung bases are clear.  Liver, spleen, pancreas, and adrenal glands within normal  limits.  Status post cholecystectomy.  No intrahepatic or extrahepatic ductal dilatation.  Kidneys are within normal limits.  No hydronephrosis.  No evidence of bowel obstruction.  No colonic wall thickening or inflammatory changes.  No evidence of abdominal aortic aneurysm.  No abdominopelvic ascites.  No free air.  No suspicious abdominopelvic lymphadenopathy.  Status post hysterectomy.  No adnexal masses.  Bladder is within normal limits.  Soft tissue gas in the left anterior abdominal wall (series 2/image 48), likely postprocedural.  Visualized osseous structures are within normal limits.  IMPRESSION: No acute findings in the abdomen/pelvis.  No free air.  No CT findings to account for the patient's abdominal pain.  Original Report Authenticated By: Charline Bills, M.D.    Medications:  Scheduled:   . ciprofloxacin  400 mg Intravenous Q12H  . cycloSPORINE  1 drop Both Eyes BID  . enoxaparin  40 mg Subcutaneous Q24H  . hyoscyamine  0.125 mg Oral TID AC  . influenza  inactive virus vaccine  0.5 mL Intramuscular Tomorrow-1000  . metronidazole  500 mg Intravenous Q6H  . pantoprazole (PROTONIX) IV  40 mg Intravenous Q12H  . sodium chloride      . sodium chloride      . sodium chloride      . DISCONTD: pantoprazole (PROTONIX) IV  40 mg Intravenous Q24H   Continuous:   . sodium chloride 125 mL/hr at 01/04/12 0012   PPI:[id number], acetaminophen, diphenhydrAMINE, HYDROmorphone, iohexol, LORazepam, ondansetron (ZOFRAN) IV, ondansetron, oxyCODONE, zolpidem, DISCONTD: hyoscyamine  Assesment: She continues to complain of right lower quadrant abdominal pain which is probably a combination of multiple factors but may be related to chronic appendicitis Principal Problem:  *Abdominal pain Active Problems:  ANXIETY  DEPRESSION  ASTHMA  GERD  LOW BACK PAIN  IBS (irritable bowel syndrome)  Nausea & vomiting    Plan: She is set for laparoscopic cholecystectomy    LOS: 2 days    Malaka Ruffner L 01/04/2012, 10:18 AM

## 2012-01-04 NOTE — Progress Notes (Signed)
Subjective; Patient continues to complain of pain in right upper and right lower quadrant. However it is not as intense as it was yesterday. Her nausea and vomiting has resolved. Her diet has been advanced; she states she does not have a good appetite. She is agreeable to have her appendix removed as recommended by Dr. Franky Macho. Objective; BP 126/72  Pulse 74  Temp(Src) 98.1 F (36.7 C) (Oral)  Resp 16  Ht 5\' 2"  (1.575 m)  Wt 148 lb 11.2 oz (67.45 kg)  BMI 27.20 kg/m2  SpO2 95%  LMP 12/29/1993 Abdomen is soft with mild tenderness at RUQ and RLQ. No rebound noted at RLQ. No organomegaly or masses. Lab data; CBC was repeated yesterday and status this morning; WBC 7.9, hemoglobin 11.1 and hematocrit 32.4. Assessment; Acute on chronic right-sided abdominal pain which is centered at right lower quadrant. Her pain has improved since last seen. Patient has been evaluated by Dr. Lovell Sheehan and she is scheduled to undergo appendectomy in a.m. hopefully this would help in her management and decrease her symptoms. Dr. Jena Gauss and his team will be seeing patient starting 01/05/2012 for GI issues.

## 2012-01-04 NOTE — Consult Note (Signed)
Reason for Consult: Possible chronic appendicitis Referring Physician: Dr. Jonnie Kind [name] is an 51 y.o. female.  HPI: Patient is a 51 year old white female with multiple medical problems who presents with a chronic history of abdominal pain. She recently underwent EGD and colonoscopy. Multiple polyps were removed from the colon, none of which were overtly malignant in nature. CT scan of the abdomen and pelvis was the most part unremarkable. Her pain seems to be in the right lower quadrant of the abdomen. I've been requested to see the patient for possible chronic appendicitis.  Past Medical History  Diagnosis Date  . Arthritis   . COPD (chronic obstructive pulmonary disease)   . Bronchitis   . Fibromyalgia   . Interstitial cystitis   . Migraine   . Depression with anxiety   . UTI (lower urinary tract infection)   . RLS (restless legs syndrome)   . Diverticulitis of colon (without mention of hemorrhage)   . Alopecia, unspecified   . Cervicalgia   . Tobacco use disorder   . PTSD (post-traumatic stress disorder)   . IBS (irritable bowel syndrome)   . GERD (gastroesophageal reflux disease)   . Dysphagia   . PONV (postoperative nausea and vomiting)   . Shortness of breath   . Kidney stones   . Anxiety     Past Surgical History  Procedure Date  . Abdominal hysterectomy   . Cholecystectomy   . Tonsillectomy   . Cardiac catheterization   . Cystostomy w/ bladder dilation     x [address] [place]    Family History  Problem Relation Age of Onset  . Coronary artery disease Mother   . Heart attack Mother   . Pulmonary embolism Mother   . Alcohol abuse Father   . Diabetes Father   . COPD Father   . Lung disease Father   . Anesthesia problems Neg Hx   . Hypotension Neg Hx   . Malignant hyperthermia Neg Hx   . Pseudochol deficiency Neg Hx     Social History:  reports that she has been smoking Cigarettes.  She has a 1.6 pack-year smoking history. She does not have any  smokeless tobacco history on file. She reports that she does not drink alcohol or use illicit drugs.  Allergies:  Allergies  Allergen Reactions  . Clarithromycin Itching and Nausea And Vomiting  . Nubain (Nalbuphine Hcl) Other (See Comments)    Deathly sick  . Pseudoephedrine Hives  . Sulfa Antibiotics Nausea And Vomiting  . Tofranil-Pm   . Factive (Gemifloxacin Mesylate) Rash  . Prochlorperazine Edisylate Anxiety  . Promethazine Hcl Anxiety  . Trimethobenzamide Hcl Anxiety    Medications: I have reviewed the patient's current medications.  Results for orders placed during the hospital encounter of 01/02/12 (from the past 48 hour(s))  CBC     Status: Abnormal   Collection Time   01/02/12  8:15 PM      Component Value Range Comment   WBC 9.8  4.0 - 10.5 (K/uL)    RBC 3.78 (*) 3.87 - 5.11 (MIL/uL)    Hemoglobin 12.0  12.0 - 15.0 (g/dL)    HCT 96.0 (*) 45.4 - 46.0 (%)    MCV 89.7  78.0 - 100.0 (fL)    MCH 31.7  26.0 - 34.0 (pg)    MCHC 35.4  30.0 - 36.0 (g/dL)    RDW 09.8  11.9 - 14.7 (%)    Platelets 316  150 - 400 (K/uL)   COMPREHENSIVE  METABOLIC PANEL     Status: Abnormal   Collection Time   01/03/12 12:00 AM      Component Value Range Comment   Sodium 137  135 - 145 (mEq/L)    Potassium 3.6  3.5 - 5.1 (mEq/L)    Chloride 106  96 - 112 (mEq/L)    CO2 25  19 - 32 (mEq/L)    Glucose, Bld 85  70 - 99 (mg/dL)    BUN 5 (*) 6 - 23 (mg/dL)    Creatinine, Ser 0.45  0.50 - 1.10 (mg/dL)    Calcium 9.1  8.4 - 10.5 (mg/dL)    Total Protein 6.3  6.0 - 8.3 (g/dL)    Albumin 3.5  3.5 - 5.2 (g/dL)    AST 17  0 - 37 (U/L)    ALT 10  0 - 35 (U/L)    Alkaline Phosphatase 55  39 - 117 (U/L)    Total Bilirubin 0.1 (*) 0.3 - 1.2 (mg/dL)    GFR calc non Af Amer >90  >90 (mL/min)    GFR calc Af Amer >90  >90 (mL/min)   CBC     Status: Abnormal   Collection Time   01/03/12  6:30 AM      Component Value Range Comment   WBC 8.6  4.0 - 10.5 (K/uL)    RBC 3.64 (*) 3.87 - 5.11 (MIL/uL)     Hemoglobin 11.5 (*) 12.0 - 15.0 (g/dL)    HCT 40.9 (*) 81.1 - 46.0 (%)    MCV 90.7  78.0 - 100.0 (fL)    MCH 31.6  26.0 - 34.0 (pg)    MCHC 34.8  30.0 - 36.0 (g/dL)    RDW 91.4  78.2 - 95.6 (%)    Platelets 314  150 - 400 (K/uL)   CBC     Status: Abnormal   Collection Time   01/03/12 12:30 PM      Component Value Range Comment   WBC 7.9  4.0 - 10.5 (K/uL)    RBC 3.55 (*) 3.87 - 5.11 (MIL/uL)    Hemoglobin 11.1 (*) 12.0 - 15.0 (g/dL)    HCT 21.3 (*) 08.6 - 46.0 (%)    MCV 91.3  78.0 - 100.0 (fL)    MCH 31.3  26.0 - 34.0 (pg)    MCHC 34.3  30.0 - 36.0 (g/dL)    RDW 57.8  46.9 - 62.9 (%)    Platelets 292  150 - 400 (K/uL)     Ct Abdomen Pelvis W Contrast  01/03/2012  *RADIOLOGY REPORT*  Clinical Data: Right abdominal pain, recent colonoscopy  CT ABDOMEN AND PELVIS WITH CONTRAST  Technique:  Multidetector CT imaging of the abdomen and pelvis was performed following the standard protocol during bolus administration of intravenous contrast.  Contrast: OMNIPAQUE IOHEXOL 300 MG/ML IV SOLN  Comparison: 04/05/2011  Findings: Lung bases are clear.  Liver, spleen, pancreas, and adrenal glands within normal limits.  Status post cholecystectomy.  No intrahepatic or extrahepatic ductal dilatation.  Kidneys are within normal limits.  No hydronephrosis.  No evidence of bowel obstruction.  No colonic wall thickening or inflammatory changes.  No evidence of abdominal aortic aneurysm.  No abdominopelvic ascites.  No free air.  No suspicious abdominopelvic lymphadenopathy.  Status post hysterectomy.  No adnexal masses.  Bladder is within normal limits.  Soft tissue gas in the left anterior abdominal wall (series 2/image 48), likely postprocedural.  Visualized osseous structures are within normal limits.  IMPRESSION:  No acute findings in the abdomen/pelvis.  No free air.  No CT findings to account for the patient's abdominal pain.  Original Report Authenticated By: Charline Bills, M.D.    ROS: See  chart Blood pressure 117/71, pulse 85, temperature 97.2 F (36.2 C), temperature source Oral, resp. rate 16, height 5\' 2"  (1.575 m), weight 67.45 kg (148 lb 11.2 oz), last menstrual period 12/29/1993, SpO2 95.00%. Physical Exam: Well-developed well-nourished white female in no acute distress. Lungs clear to auscultation with equal breath sounds bilaterally. Heart examination reveals a regular rate and rhythm without S3, S4, murmurs. Abdomen is soft, nondistended. No hepatosplenomegaly or masses are noted. The patient does point to the right lower quadrant of the abdomen. No hernias are appreciated. No rigidity is noted.  Assessment/Plan: Chronic abdominal pain, probable multifactorial in nature. She probably does need her appendix out as to keep acute appendicitis from being a possible diagnostic issue in the future. I have told the patient that this may or may not help her with her abdominal pain. She understands and agrees to the surgery. The risks and benefits of the procedure including bleeding, infection, and a possibly of an open procedure were fully explained to the patient, gave informed consent. She is scheduled undergo a laparoscopic appendectomy tomorrow.  [name] A 01/04/2012, 8:13 AM

## 2012-01-05 ENCOUNTER — Encounter (HOSPITAL_COMMUNITY)
Admission: EM | Disposition: A | Discharge status: Home / Self Care | Payer: Self-pay | Source: Home / Self Care | Attending: Pulmonary Disease

## 2012-01-05 ENCOUNTER — Encounter (HOSPITAL_COMMUNITY): Payer: Self-pay | Admitting: Anesthesiology

## 2012-01-05 ENCOUNTER — Other Ambulatory Visit: Payer: Self-pay | Admitting: General Surgery

## 2012-01-05 ENCOUNTER — Encounter (HOSPITAL_COMMUNITY): Payer: Self-pay | Admitting: *Deleted

## 2012-01-05 ENCOUNTER — Inpatient Hospital Stay (HOSPITAL_COMMUNITY): Payer: Medicare Other | Admitting: Anesthesiology

## 2012-01-05 HISTORY — PX: LAPAROSCOPIC APPENDECTOMY: SHX408

## 2012-01-05 HISTORY — PX: APPENDECTOMY: SHX54

## 2012-01-05 SURGERY — APPENDECTOMY, LAPAROSCOPIC
Anesthesia: General | Site: Abdomen | Wound class: Clean Contaminated

## 2012-01-05 MED ORDER — LACTATED RINGERS IV SOLN
INTRAVENOUS | Status: DC
  Administered 2012-01-05: 1000 mL via INTRAVENOUS

## 2012-01-05 MED ORDER — LIDOCAINE HCL (PF) 1 % IJ SOLN
INTRAMUSCULAR | Status: AC
  Filled 2012-01-05: qty 5

## 2012-01-05 MED ORDER — PROPOFOL 10 MG/ML IV EMUL
INTRAVENOUS | Status: AC
  Filled 2012-01-05: qty 20

## 2012-01-05 MED ORDER — MIDAZOLAM HCL 2 MG/2ML IJ SOLN
INTRAMUSCULAR | Status: AC
  Administered 2012-01-05: 2 mg via INTRAVENOUS
  Filled 2012-01-05: qty 2

## 2012-01-05 MED ORDER — ACETAMINOPHEN 10 MG/ML IV SOLN
INTRAVENOUS | Status: AC
  Administered 2012-01-05: 1000 mg via INTRAVENOUS
  Filled 2012-01-05: qty 100

## 2012-01-05 MED ORDER — DEXAMETHASONE SODIUM PHOSPHATE 4 MG/ML IJ SOLN
INTRAMUSCULAR | Status: AC
  Administered 2012-01-05: 4 mg via INTRAVENOUS
  Filled 2012-01-05: qty 1

## 2012-01-05 MED ORDER — GLYCOPYRROLATE 0.2 MG/ML IJ SOLN
0.2000 mg | Freq: Once | INTRAMUSCULAR | Status: AC
  Administered 2012-01-05: 0.2 mg via INTRAVENOUS

## 2012-01-05 MED ORDER — SODIUM CHLORIDE 0.9 % IJ SOLN
INTRAMUSCULAR | Status: AC
  Filled 2012-01-05: qty 3

## 2012-01-05 MED ORDER — GLYCOPYRROLATE 0.2 MG/ML IJ SOLN
INTRAMUSCULAR | Status: DC | PRN
  Administered 2012-01-05: .4 mg via INTRAVENOUS

## 2012-01-05 MED ORDER — ACETAMINOPHEN 10 MG/ML IV SOLN
1000.0000 mg | Freq: Four times a day (QID) | INTRAVENOUS | Status: DC
  Administered 2012-01-05 (×3): 1000 mg via INTRAVENOUS
  Filled 2012-01-05 (×4): qty 100

## 2012-01-05 MED ORDER — ROCURONIUM BROMIDE 50 MG/5ML IV SOLN
INTRAVENOUS | Status: AC
  Filled 2012-01-05: qty 1

## 2012-01-05 MED ORDER — SODIUM CHLORIDE 0.9 % IJ SOLN
INTRAMUSCULAR | Status: AC
  Administered 2012-01-05: 10 mL
  Filled 2012-01-05: qty 3

## 2012-01-05 MED ORDER — ONDANSETRON HCL 4 MG/2ML IJ SOLN
INTRAMUSCULAR | Status: AC
  Administered 2012-01-05: 4 mg via INTRAVENOUS
  Filled 2012-01-05: qty 2

## 2012-01-05 MED ORDER — SODIUM CHLORIDE 0.9 % IR SOLN
Status: DC | PRN
  Administered 2012-01-05 (×2): 1000 mL

## 2012-01-05 MED ORDER — BUPIVACAINE HCL (PF) 0.5 % IJ SOLN
INTRAMUSCULAR | Status: AC
  Filled 2012-01-05: qty 30

## 2012-01-05 MED ORDER — LIDOCAINE HCL (CARDIAC) 10 MG/ML IV SOLN
INTRAVENOUS | Status: DC | PRN
  Administered 2012-01-05: 10 mg via INTRAVENOUS

## 2012-01-05 MED ORDER — FENTANYL CITRATE 0.05 MG/ML IJ SOLN
INTRAMUSCULAR | Status: AC
  Administered 2012-01-05: 50 ug via INTRAVENOUS
  Filled 2012-01-05: qty 5

## 2012-01-05 MED ORDER — GLYCOPYRROLATE 0.2 MG/ML IJ SOLN
INTRAMUSCULAR | Status: AC
  Administered 2012-01-05: 0.2 mg via INTRAVENOUS
  Filled 2012-01-05: qty 1

## 2012-01-05 MED ORDER — PROPOFOL 10 MG/ML IV EMUL
INTRAVENOUS | Status: DC | PRN
  Administered 2012-01-05: 140 mg via INTRAVENOUS
  Administered 2012-01-05: 30 mg via INTRAVENOUS

## 2012-01-05 MED ORDER — GLYCOPYRROLATE 0.2 MG/ML IJ SOLN
INTRAMUSCULAR | Status: AC
  Filled 2012-01-05: qty 1

## 2012-01-05 MED ORDER — NEOSTIGMINE METHYLSULFATE 1 MG/ML IJ SOLN
INTRAMUSCULAR | Status: DC | PRN
  Administered 2012-01-05: 2 mg via INTRAVENOUS

## 2012-01-05 MED ORDER — FENTANYL CITRATE 0.05 MG/ML IJ SOLN
25.0000 ug | INTRAMUSCULAR | Status: DC | PRN
  Administered 2012-01-05 (×2): 50 ug via INTRAVENOUS

## 2012-01-05 MED ORDER — ROCURONIUM BROMIDE 100 MG/10ML IV SOLN
INTRAVENOUS | Status: DC | PRN
  Administered 2012-01-05: 30 mg via INTRAVENOUS
  Administered 2012-01-05: 10 mg via INTRAVENOUS

## 2012-01-05 MED ORDER — DEXAMETHASONE SODIUM PHOSPHATE 4 MG/ML IJ SOLN
4.0000 mg | Freq: Once | INTRAMUSCULAR | Status: AC
  Administered 2012-01-05: 4 mg via INTRAVENOUS

## 2012-01-05 MED ORDER — FENTANYL CITRATE 0.05 MG/ML IJ SOLN
INTRAMUSCULAR | Status: AC
  Administered 2012-01-05: 50 ug via INTRAVENOUS
  Filled 2012-01-05: qty 2

## 2012-01-05 MED ORDER — BUPIVACAINE HCL (PF) 0.5 % IJ SOLN
INTRAMUSCULAR | Status: DC | PRN
Start: 2012-01-05 — End: 2012-01-05
  Administered 2012-01-05: 10 mL

## 2012-01-05 MED ORDER — MIDAZOLAM HCL 2 MG/2ML IJ SOLN
1.0000 mg | INTRAMUSCULAR | Status: DC | PRN
  Administered 2012-01-05 (×2): 2 mg via INTRAVENOUS

## 2012-01-05 MED ORDER — ONDANSETRON HCL 4 MG/2ML IJ SOLN
4.0000 mg | Freq: Once | INTRAMUSCULAR | Status: AC
  Administered 2012-01-05: 4 mg via INTRAVENOUS

## 2012-01-05 MED ORDER — SODIUM CHLORIDE 0.9 % IJ SOLN
INTRAMUSCULAR | Status: AC
  Filled 2012-01-05: qty 10

## 2012-01-05 MED ORDER — FENTANYL CITRATE 0.05 MG/ML IJ SOLN
INTRAMUSCULAR | Status: DC | PRN
  Administered 2012-01-05 (×4): 50 ug via INTRAVENOUS

## 2012-01-05 MED ORDER — ONDANSETRON HCL 4 MG/2ML IJ SOLN: 4.0000 mg | Freq: Once | INTRAMUSCULAR | Status: DC | PRN

## 2012-01-05 SURGICAL SUPPLY — 42 items
BAG HAMPER (MISCELLANEOUS) ×2 IMPLANT
CLOTH BEACON ORANGE TIMEOUT ST (SAFETY) ×2 IMPLANT
COVER LIGHT HANDLE STERIS (MISCELLANEOUS) ×4 IMPLANT
CUTTER ENDO LINEAR 45M (STAPLE) IMPLANT
CUTTER LINEAR ENDO 35 ETS (STAPLE) IMPLANT
CUTTER LINEAR ENDO 35 ETS TH (STAPLE) ×2 IMPLANT
DECANTER SPIKE VIAL GLASS SM (MISCELLANEOUS) ×2 IMPLANT
DISSECTOR BLUNT TIP ENDO 5MM (MISCELLANEOUS) IMPLANT
DURAPREP 26ML APPLICATOR (WOUND CARE) ×2 IMPLANT
ELECT REM PT RETURN 9FT ADLT (ELECTROSURGICAL) ×2
ELECTRODE REM PT RTRN 9FT ADLT (ELECTROSURGICAL) ×1 IMPLANT
FILTER SMOKE EVAC LAPAROSHD (FILTER) ×2 IMPLANT
FORMALIN 10 PREFIL 120ML (MISCELLANEOUS) ×2 IMPLANT
GLOVE BIO SURGEON STRL SZ7.5 (GLOVE) ×2 IMPLANT
GLOVE BIOGEL PI IND STRL 7.0 (GLOVE) ×1 IMPLANT
GLOVE BIOGEL PI INDICATOR 7.0 (GLOVE) ×1
GLOVE ECLIPSE 6.5 STRL STRAW (GLOVE) ×2 IMPLANT
GOWN STRL REIN XL XLG (GOWN DISPOSABLE) ×4 IMPLANT
INST SET LAPROSCOPIC AP (KITS) ×2 IMPLANT
IV NS IRRIG 3000ML ARTHROMATIC (IV SOLUTION) ×2 IMPLANT
KIT ROOM TURNOVER APOR (KITS) ×2 IMPLANT
MANIFOLD NEPTUNE II (INSTRUMENTS) ×2 IMPLANT
NEEDLE INSUFFLATION 14GA 120MM (NEEDLE) ×2 IMPLANT
NS IRRIG 1000ML POUR BTL (IV SOLUTION) ×2 IMPLANT
PACK LAP CHOLE LZT030E (CUSTOM PROCEDURE TRAY) ×2 IMPLANT
PAD ARMBOARD 7.5X6 YLW CONV (MISCELLANEOUS) ×2 IMPLANT
POUCH SPECIMEN RETRIEVAL 10MM (ENDOMECHANICALS) ×2 IMPLANT
RELOAD /EVU35 (ENDOMECHANICALS) IMPLANT
RELOAD 45 VASCULAR/THIN (ENDOMECHANICALS) IMPLANT
RELOAD CUTTER ETS 35MM STAND (ENDOMECHANICALS) IMPLANT
SCALPEL HARMONIC ACE (MISCELLANEOUS) ×2 IMPLANT
SET BASIN LINEN APH (SET/KITS/TRAYS/PACK) ×2 IMPLANT
SET TUBE IRRIG SUCTION NO TIP (IRRIGATION / IRRIGATOR) IMPLANT
SPONGE GAUZE 2X2 8PLY STRL LF (GAUZE/BANDAGES/DRESSINGS) ×6 IMPLANT
STAPLER VISISTAT (STAPLE) ×2 IMPLANT
SUT VICRYL 0 UR6 27IN ABS (SUTURE) ×2 IMPLANT
TRAY FOLEY CATH 14FR (SET/KITS/TRAYS/PACK) ×2 IMPLANT
TROCAR Z-THAD FIOS HNDL 12X100 (TROCAR) ×2 IMPLANT
TROCAR Z-THRD FIOS HNDL 11X100 (TROCAR) ×2 IMPLANT
TROCAR Z-THREAD FIOS 5X100MM (TROCAR) ×2 IMPLANT
WARMER LAPAROSCOPE (MISCELLANEOUS) ×2 IMPLANT
YANKAUER SUCT BULB TIP 10FT TU (MISCELLANEOUS) ×2 IMPLANT

## 2012-01-05 NOTE — Op Note (Signed)
Patient:  [name]  DOB:  [birth year]  MRN:  [id number]   Preop Diagnosis:  Chronic abdominal pain  Postop Diagnosis:  Same, chronic appendicitis  Procedure:  Laparoscopic appendectomy  Surgeon:  Franky Macho, M.D.  Anes:  General endotracheal  Indications:  Patient is a 51 year old white female multiple medical problems who presents with a chronic history of right sided abdominal pain. She has had an extensive workup. She now comes the operating room for exploratory laparoscopy, appendectomy. The risks and benefits of the procedure including bleeding, infection, the possibly of an open procedure, and the possibility of no resolution of her abdominal pain were fully explained to the patient, gave informed consent.  Procedure note:  Patient's placed the supine position. After induction of general endotracheal anesthesia, the abdomen was prepped and draped using usual sterile technique with DuraPrep. Surgical site confirmation was performed.  A supraumbilical incision was made down to the fascia. A Veress needle was introduced into the abdominal cavity and confirmation of placement was done using the saline drop test. The abdomen was then insufflated to 16 mm mercury pressure. 11 mm trocar was introduced into the abdominal cavity under direct visualization without difficulty. Patient was placed in deeper Trendelenburg position additional 5 mm trocar was placed left lower quadrant region and a 12 mm trocar was placed the suprapubic region. There was some omentum adhesed to the anterior abdominal wall through a previous umbilical surgical site. This was divided using the harmonic scalpel. The right lower corner was inspected and there was no evidence of a Meckel's diverticulum or inflammatory bowel disease. The appendix was noted elongated. The mesoappendix was divided using the harmonic scalpel. A vascular Endo GIA was placed across the base the appendix and fired. The appendix was then removed  using an Endo Catch bag and sent to pathology further examination. Staple line was inspected and noted within normal limits. All fluid and air were then evacuated from the abdominal cavity prior to removal of the trochars.  All wounds were gave normal saline. All wounds were injected with 0.5% Sensorcaine. The supraumbilical fascia as well as suprapubic fascia reapproximated using 0 Vicryl interrupted sutures. All skin incisions were closed using staples. Betadine ointment dorsal dressings were applied.  All tape and needle counts were correct the end of the procedure. Patient was extubated in the operating room went back to PACU in stable condition.  Complications:  None  EBL:  Minimal  Specimen:  Appendix

## 2012-01-05 NOTE — Anesthesia Preprocedure Evaluation (Addendum)
Anesthesia Evaluation  Patient identified by MRN, date of birth, ID band Patient awake    Reviewed: Allergy & Precautions, H&P , NPO status , Patient's Chart, lab work & pertinent test results  History of Anesthesia Complications (+) PONV  Airway Mallampati: I      Dental  (+) Teeth Intact and Implants   Pulmonary asthma , COPD   Pulmonary exam normal       Cardiovascular neg cardio ROS Regular Normal    Neuro/Psych  Headaches, PSYCHIATRIC DISORDERS Anxiety Depression  Neuromuscular disease    GI/Hepatic hiatal hernia, GERD-  Medicated,  Endo/Other    Renal/GU      Musculoskeletal  (+) Fibromyalgia -  Abdominal   Peds  Hematology   Anesthesia Other Findings   Reproductive/Obstetrics                           Anesthesia Physical Anesthesia Plan  ASA: III  Anesthesia Plan: General   Post-op Pain Management:    Induction: Intravenous, Rapid sequence and Cricoid pressure planned  Airway Management Planned: Oral ETT  Additional Equipment:   Intra-op Plan:   Post-operative Plan: Extubation in OR  Informed Consent: I have reviewed the patients History and Physical, chart, labs and discussed the procedure including the risks, benefits and alternatives for the proposed anesthesia with the patient or authorized representative who has indicated his/her understanding and acceptance.     Plan Discussed with:   Anesthesia Plan Comments:         Anesthesia Quick Evaluation

## 2012-01-05 NOTE — Anesthesia Procedure Notes (Signed)
Procedure Name: Intubation Date/Time: 01/05/2012 9:32 AM Performed by: Minerva Areola Pre-anesthesia Checklist: Patient identified, Patient being monitored, Timeout performed, Emergency Drugs available and Suction available Patient Re-evaluated:Patient Re-evaluated prior to inductionOxygen Delivery Method: Circle System Utilized Preoxygenation: Pre-oxygenation with 100% oxygen Intubation Type: Rapid sequence and Cricoid Pressure applied Ventilation: Mask ventilation without difficulty Laryngoscope Size: Miller and 2 Grade View: Grade I Tube type: Oral Tube size: 7.0 mm Number of attempts: 1 Airway Equipment and Method: stylet Placement Confirmation: ETT inserted through vocal cords under direct vision,  positive ETCO2 and breath sounds checked- equal and bilateral Secured at: 21 cm Tube secured with: Tape Dental Injury: Teeth and Oropharynx as per pre-operative assessment

## 2012-01-05 NOTE — Anesthesia Postprocedure Evaluation (Signed)
Anesthesia Post Note  Patient: [name]  Procedure(s) Performed:  APPENDECTOMY LAPAROSCOPIC  Anesthesia type: General  Patient location: PACU  Post pain: Pain level controlled  Post assessment: Post-op Vital signs reviewed, Patient's Cardiovascular Status Stable, Respiratory Function Stable, Patent Airway, No signs of Nausea or vomiting and Pain level controlled  Last Vitals:  Filed Vitals:   01/05/12 1022  BP: 136/88  Pulse: 100  Temp: 36.8 C  Resp: 18    Post vital signs: Reviewed and stable  Level of consciousness: awake and alert   Complications: No apparent anesthesia complications

## 2012-01-05 NOTE — Transfer of Care (Signed)
Immediate Anesthesia Transfer of Care Note  Patient: [name]  Procedure(s) Performed:  APPENDECTOMY LAPAROSCOPIC  Patient Location: PACU  Anesthesia Type: General  Level of Consciousness: awake  Airway & Oxygen Therapy: Patient Spontanous Breathing and non-rebreather face mask  Post-op Assessment: Report given to PACU RN, Post -op Vital signs reviewed and stable and Patient moving all extremities  Post vital signs: Reviewed and stable  Complications: No apparent anesthesia complications

## 2012-01-05 NOTE — Progress Notes (Signed)
CARE MANAGEMENT NOTE 01/05/2012  Patient:  [name], [name]   Account Number:  [id number]  Date Initiated:  01/05/2012  Documentation initiated by:  Rosemary Holms  Subjective/Objective Assessment:   Pt admitted with with rt sided abdominal paint. PTA lives independently at home     Action/Plan:   no anticipated HH needs but CM to follow   Anticipated DC Date:     Anticipated DC Plan:        DC Planning Services  CM consult      Choice offered to / List presented to:             Status of service:  In process, will continue to follow Medicare Important Message given?   (If response is "NO", the following Medicare IM given date fields will be blank) Date Medicare IM given:   Date Additional Medicare IM given:    Discharge Disposition:    Per UR Regulation:    Comments:  01/05/12 1200 Simya Tercero Leanord Hawking RN BSN CM

## 2012-01-06 MED ORDER — SODIUM CHLORIDE 0.9 % IJ SOLN
INTRAMUSCULAR | Status: AC
  Filled 2012-01-06: qty 3

## 2012-01-06 MED ORDER — OXYCODONE-ACETAMINOPHEN 10-325 MG PO TABS: 1.0000 | ORAL_TABLET | ORAL | Status: DC | PRN

## 2012-01-06 NOTE — Progress Notes (Signed)
CARE MANAGEMENT NOTE 01/06/2012  Patient:  [name], [name]   Account Number:  [id number]  Date Initiated:  01/05/2012  Documentation initiated by:  Rosemary Holms  Subjective/Objective Assessment:   Pt admitted with with rt sided abdominal paint. PTA lives independently at home     Action/Plan:   no anticipated HH needs but CM to follow   Anticipated DC Date:  01/06/2012   Anticipated DC Plan:  HOME/SELF CARE      DC Planning Services  CM consult      Choice offered to / List presented to:             Status of service:  Completed, signed off Medicare Important Message given?   (If response is "NO", the following Medicare IM given date fields will be blank) Date Medicare IM given:   Date Additional Medicare IM given:    Discharge Disposition:    Per UR Regulation:    Comments:  01/06/12 1000 Marthe Dant RN BSN CM  01/05/12 1200 Courtez Twaddle Leanord Hawking RN BSN CM

## 2012-01-06 NOTE — Discharge Summary (Signed)
Physician Discharge Summary  Patient ID: [name] MRN: [id number] DOB/AGE: [birth year] 50 y.o.  Admit date: 01/02/2012 Discharge date: 01/06/2012  Admission Diagnoses: Chronic right-sided abdominal pain  Discharge Diagnoses: Chronic right-sided abdominal pain Principal Problem:  *Abdominal pain Active Problems:  ANXIETY  DEPRESSION  ASTHMA  GERD  LOW BACK PAIN  IBS (irritable bowel syndrome)  Nausea & vomiting   Discharged Condition: good  Hospital Course: Patient is a 51 year old white female with multiple medical problems who presented to the hospital with worsening right-sided abdominal pain. She had a previous EGD and colonoscopy by Dr. Jena Gauss. Multiple polyps were removed but the were not malignant. I was consulted due to the possibility of chronic appendicitis. After discussion with the patient, it was felt that removing her appendix may help her pain, but will more importantly take the appendix out of in the further diagnostic workup. She underwent a laparoscopic appendectomy on 01/05/2012. She tolerated the procedure well. Her postoperative course was remarkable for mild confusion which did resolve. This is probably secondary to anxiety. She is being discharged home on postoperative day one in good and improving condition.   Consults: GI and general surgery  Treatments: surgery: Laparoscopic appendectomy on 01/05/2012   Discharge Exam: Blood pressure 128/88, pulse 73, temperature 98.1 F (36.7 C), temperature source Oral, resp. rate 18, height 5\' 2"  (1.575 m), weight 67.45 kg (148 lb 11.2 oz), last menstrual period 12/29/1993, SpO2 98.00%. General appearance: alert, cooperative and no distress Resp: clear to auscultation bilaterally Cardio: regular rate and rhythm, S1, S2 normal, no murmur, click, rub or gallop GI: Soft, flat. Minimal incisional pain noted. Dressings dry and intact.  Disposition: Home  Medication List  As of 01/06/2012  9:28 AM   CHANGE how you take  these medications         oxyCODONE-acetaminophen 10-325 MG per tablet   Commonly known as: PERCOCET   Take 1 tablet by mouth every 4 (four) hours as needed for pain. For pain   What changed: reasons to take the med         CONTINUE taking these medications         ACIPHEX 20 MG tablet   Generic drug: RABEprazole      ALPRAZolam 1 MG tablet   Commonly known as: XANAX      amphetamine-dextroamphetamine 20 MG tablet   Commonly known as: ADDERALL      cetirizine 10 MG tablet   Commonly known as: ZYRTEC      cycloSPORINE 0.05 % ophthalmic emulsion   Commonly known as: RESTASIS      estradiol 0.1 MG/24HR   Commonly known as: VIVELLE-DOT      FLUoxetine 40 MG capsule   Commonly known as: PROZAC      hyoscyamine 0.125 MG SL tablet   Commonly known as: LEVSIN SL      ondansetron 4 MG tablet   Commonly known as: ZOFRAN      pramipexole 0.5 MG tablet   Commonly known as: MIRAPEX      topiramate 50 MG tablet   Commonly known as: TOPAMAX      VENTOLIN HFA 108 (90 BASE) MCG/ACT inhaler   Generic drug: albuterol          Where to get your medications    These are the prescriptions that you need to pick up.   You may get these medications from any pharmacy.         oxyCODONE-acetaminophen 10-325 MG per tablet  Follow-up Information    Follow up with Audrey Thull A. Make an appointment on 01/13/2012.   Contact information:   [address] [place] [address] [phone]          Signed: Dalia Heading 01/06/2012, 9:28 AM

## 2012-01-06 NOTE — Progress Notes (Signed)
Patient states understanding of discharge instructions.prescription given 

## 2012-01-06 NOTE — Progress Notes (Signed)
Patient has been very agitated and anxious during the night.  Patient has been very tearful and has stated that her pain has been worse.  Patient has also stated that she has been having difficulty with voiding.  Patient has voided several times, placed a hat in toilet to measure output and patient removed it.  Patient is alert but has disassociated herself from her actions.  Patient pulled out IV and has constantly pulled out iv tubing from pole/pump and has been advised to leave those material alone, but patient continues to mess with pump.  Patient is just very uncooperative and tearful at times.

## 2012-01-08 ENCOUNTER — Encounter (HOSPITAL_COMMUNITY): Payer: Self-pay | Admitting: General Surgery

## 2012-01-08 NOTE — Progress Notes (Signed)
Utilization review completed.  

## 2012-01-13 DIAGNOSIS — D3A020 Benign carcinoid tumor of the appendix: Secondary | ICD-10-CM

## 2012-01-13 HISTORY — DX: Benign carcinoid tumor of the appendix: D3A.020

## 2012-01-16 ENCOUNTER — Encounter (HOSPITAL_COMMUNITY): Payer: Medicare Other | Attending: Oncology | Admitting: Oncology

## 2012-01-16 ENCOUNTER — Ambulatory Visit (HOSPITAL_COMMUNITY): Payer: Medicare Other | Admitting: Oncology

## 2012-01-16 ENCOUNTER — Encounter (HOSPITAL_COMMUNITY): Payer: Self-pay | Admitting: Oncology

## 2012-01-16 DIAGNOSIS — R300 Dysuria: Secondary | ICD-10-CM

## 2012-01-16 DIAGNOSIS — D3A00 Benign carcinoid tumor of unspecified site: Secondary | ICD-10-CM

## 2012-01-16 DIAGNOSIS — C7A020 Malignant carcinoid tumor of the appendix: Secondary | ICD-10-CM

## 2012-01-16 LAB — URINALYSIS, ROUTINE W REFLEX MICROSCOPIC
Ketones, ur: NEGATIVE mg/dL
Leukocytes, UA: NEGATIVE
Nitrite: NEGATIVE
Protein, ur: NEGATIVE mg/dL
Urobilinogen, UA: 0.2 mg/dL (ref 0.0–1.0)

## 2012-01-16 LAB — URINE CULTURE
Colony Count: NO GROWTH
Culture  Setup Time: 201301190359
Culture: NO GROWTH

## 2012-01-16 NOTE — Patient Instructions (Signed)
[name]  [id number] 1961-07-05   [facility]  Discharge Instructions  RECOMMENDATIONS MADE BY THE CONSULTANT AND ANY TEST RESULTS WILL BE SENT TO YOUR REFERRING DOCTOR.   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  Your prognosis is probably very good.  2 options: Because of your young age would consider you having a right hemi-colectomy with lymph nodes removed or just see you every 6 months checking urine and blood work to monitor for recurrence.  MEDICATIONS PRESCRIBED: none   INSTRUCTIONS GIVEN AND DISCUSSED: Other :  Dr. Mariel Sleet will talk with Dr. Lovell Sheehan and Dr. Jena Gauss and we will be back in touch.  SPECIAL INSTRUCTIONS/FOLLOW-UP: Lab work Needed Monday and Return to Clinic in 6 months.   I acknowledge that I have been informed and understand all the instructions given to me and received a copy. I do not have any more questions at this time, but understand that I may call the Specialty Clinic at [facility] at [phone] during business hours should I have any further questions or need assistance in obtaining follow-up care.    __________________________________________  _____________  __________ Signature of Patient or Authorized Representative            Date                   Time    __________________________________________ Nurse's Signature

## 2012-01-16 NOTE — Progress Notes (Signed)
This office note has been dictated.

## 2012-01-16 NOTE — Progress Notes (Signed)
CC:   Edward L. Juanetta Gosling, M.D.  DIAGNOSES: 1. 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 2. 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 Roetta Sessions.  history of asthma times many     years. 3. History of smoking a half a pack a day or less for 6 years. 4. Bilateral nipple discharge that is discolored but with what she     states is negative mammography this past year but she has never had     ductogram and we will try to mammogram reports but they are not     available in [place] or on her medical record.  She states she had     them within the last 3-4 months. 5. History of chronic low back pain. 6. History of irritable bowel syndrome. 7. History of anxiety and depression in years past. 8. History of allergic rhinitis. 9. History of gastroesophageal reflux disease. 10.History of weight loss in the past.  This is a pleasant 51 year old Caucasian lady who is here with her husband.  They have known each other many years, started dating again several years ago, got married about 2 years ago.  She has a history of bulimia and IBS, heartburn, palpitations, pressure in esophagus, actually had cardiac catheterization several years ago at [facility] by the [facility] group for her chest discomfort.  That was a negative study interestingly of the heart certainly consistent with her noncardiac issues.  She had right lower quadrant pain for about a year and a half she states.  Perhaps got worse in the fall.  Had a workup by Dr. Jena Gauss and that included a CT scan prior to her surgery as well as colonoscopy and EGD.  At the time of this procedure she was actually found to have a high-grade approximately 10 mm polyp in the sigmoid colon with high- grade dysplasia.  She also has history of PTSD.  She was pregnant x2, one ended in what sounds  like an elective abortion.  There is also a history of restless leg syndrome.  She has intolerance to several drugs including clarithromycin which gives her itching and nausea and vomiting.  Pseudoephedrine gives her hives.  Sulfa antibiotics give her nausea and vomiting.  Factive gives her a rash and then prochlorperazine, promethazine and trimethobenzamide give her anxiety.  Her mother, sounds like she died of a pulmonary embolus.  Her father may have had a complications of COPD and diabetes and alcohol abuse.  She has 2 sisters and a brother in good health to the best of her knowledge. There is no cancer in the family that she is aware of immediately.  She is accompanied by her husband.  They actually grew up next door to each other and then went their separate ways and got back together several years ago.  PHYSICAL EXAMINATION:  Vital signs:  She weighs 153 pounds which is stable.  Height 5 feet 2 inches, BMI is actually 28.  Blood pressure 111/74 left arm sitting position, pulse 80 and regular.  Respirations 14 to 16 and unlabored.  Temperature is normal.  Lungs:  She does not have any wheezing presently.  No rales.  No abnormal breath sounds.  Neck: She has no thyromegaly.  Lymphs:  She has no lymphadenopathy in any  location including cervical, supraclavicular, infraclavicular, axillary and inguinal areas.  Abdomen:  She still complains of pain in her abdomen.  Her incision sites are healing very nicely.  Her abdomen is not distended.  There is no edema.  Abdomen is soft.  It is tender only close to the incision sites.  She does have a liver that is palpable approximately 2-3 cm below the costal margin, and quiet inspiration. She has a little fullness in the left upper quadrant but I cannot feel a distinct spleen tip.  Breasts:  She can elicit a small amount of discharge that I cannot tell what color it is from the right nipple. There are fibroglandular changes in both breasts  but no obvious masses. She is tender in both breasts mildly.  Heart:  Shows a regular rhythm and rate without murmur, rub or gallop.  HEENT:  She has good dental hygiene.  Throat is clear.  Pupils equally round, they react minimally though I should say to light.  Pulses in her feet are trace to 1+ and symmetrical.  Nails are unremarkable.  Elvena actually has a difficult issue in that she has a well- differentiated carcinoid of the appendix which was small and found because of an exploratory laparotomy done for abdominal pain with the intent of doing an appendectomy.  In spite of her negative CT scan she of course has a low risk of having nodal involvement due to the size of this but at age 51 she has a long life ahead of her hopefully and I think since this is a potentially curable disease reconsideration of a right hemicolectomy with nodal evaluation I think is not unreasonable.  Octreotide scan might be helpful but I think it would be of very little benefit.  I think a serum serotonin and 5 HIAA levels would be of interest.  They should at least be done as a baseline.  So what I have discussed with her and her family is that there are 2 options.  One is just observation with the chances being quite good that she will not have a recurrence of this and the other option is to be aggressive and treat her with a right hemicolectomy to at least evaluate nodes and if any are involved it might be a curative procedure because I certainly do not think we want this disease to come back and plague her at such a young age.  She knows that some of these are very, very slow growing of course.  So she has had a good discussion with Dr. Lovell Sheehan and I have talked to him myself this afternoon.  We will rediscuss her again on Monday.  The other thing she is having is a little burning on urination, irritation of the urethra she states.  We will get a urinalysis today and culture.  I did not repeat  a CBC and CMET.  She had those preoperatively and they were quite good.  So she is somewhat of a difficult decision without question.  Her prognosis is still excellent regardless of what she does. I tried to reinforce that.  Will check her baseline labs and tentatively see her one way or the other in 6 months but I have told her that the 2 options are the above and I want her to think about things.  She actually made the statement that she would like to have the right hemicolectomy without question but I think I want her to think about that anyway.  So  I will see her back one way or the other.    ______________________________ Ladona Horns. Mariel Sleet, MD ESN/MEDQ  D:  01/16/2012  T:  01/16/2012  Job:  [id number]

## 2012-01-17 ENCOUNTER — Other Ambulatory Visit (HOSPITAL_COMMUNITY): Payer: Self-pay | Admitting: Oncology

## 2012-01-17 DIAGNOSIS — Z0000 Encounter for general adult medical examination without abnormal findings: Secondary | ICD-10-CM

## 2012-01-17 DIAGNOSIS — N6452 Nipple discharge: Secondary | ICD-10-CM

## 2012-01-19 ENCOUNTER — Telehealth: Payer: Self-pay | Admitting: Internal Medicine

## 2012-01-19 ENCOUNTER — Other Ambulatory Visit: Payer: Self-pay | Admitting: Gastroenterology

## 2012-01-19 ENCOUNTER — Encounter (HOSPITAL_BASED_OUTPATIENT_CLINIC_OR_DEPARTMENT_OTHER): Payer: Medicare Other

## 2012-01-19 ENCOUNTER — Telehealth (HOSPITAL_COMMUNITY): Payer: Self-pay

## 2012-01-19 ENCOUNTER — Ambulatory Visit (HOSPITAL_COMMUNITY): Payer: Medicare Other

## 2012-01-19 DIAGNOSIS — D3A00 Benign carcinoid tumor of unspecified site: Secondary | ICD-10-CM

## 2012-01-19 DIAGNOSIS — D3A098 Benign carcinoid tumors of other sites: Secondary | ICD-10-CM

## 2012-01-19 DIAGNOSIS — D3A020 Benign carcinoid tumor of the appendix: Secondary | ICD-10-CM

## 2012-01-19 NOTE — Telephone Encounter (Signed)
Pt is scheduled for [facility] on 01/29 @ 7:30- Instructions mailed

## 2012-01-19 NOTE — Telephone Encounter (Signed)
I spoke to Dr. Mariel Sleet regarding this patient today. He feels she should undergo a right hemicolectomy given her relative young age to minimize chance of recurrence. Also he suggested she get her a small valve imaged prior to a right hemicolectomy. She had a normal terminal ileum at the time of colonoscopy. I agree with Dr. Mariel Sleet.  We'll set this lady up for a capsule study of her small bowel as soon as can be arranged.

## 2012-01-19 NOTE — Telephone Encounter (Signed)
Message copied by Sterling Big on Mon Jan 19, 2012  9:57 AM ------      Message from: Mariel Sleet, ERIC S      Created: Mon Jan 19, 2012  9:54 AM       Call Latori urine negative for any infection.

## 2012-01-19 NOTE — Telephone Encounter (Signed)
Tried to call pt- LMOM 

## 2012-01-19 NOTE — Telephone Encounter (Signed)
Patient notified

## 2012-01-19 NOTE — Progress Notes (Signed)
Labs drawn today for serotonin and 24 hour urine for 5HIAA

## 2012-01-20 ENCOUNTER — Encounter (HOSPITAL_COMMUNITY): Payer: Self-pay | Admitting: Pharmacy Technician

## 2012-01-20 NOTE — Telephone Encounter (Signed)
Pt is aware.  

## 2012-01-21 ENCOUNTER — Ambulatory Visit (HOSPITAL_COMMUNITY)
Admission: RE | Admit: 2012-01-21 | Discharge: 2012-01-21 | Disposition: A | Discharge status: Home / Self Care | Payer: Medicare Other | Source: Ambulatory Visit | Attending: Oncology | Admitting: Oncology

## 2012-01-21 ENCOUNTER — Ambulatory Visit (HOSPITAL_COMMUNITY): Payer: Medicare Other | Admitting: Oncology

## 2012-01-21 ENCOUNTER — Ambulatory Visit (HOSPITAL_COMMUNITY): Admission: RE | Admit: 2012-01-21 | Payer: Medicare Other | Source: Ambulatory Visit

## 2012-01-21 DIAGNOSIS — N644 Mastodynia: Secondary | ICD-10-CM | POA: Insufficient documentation

## 2012-01-21 DIAGNOSIS — N6452 Nipple discharge: Secondary | ICD-10-CM

## 2012-01-21 DIAGNOSIS — N6459 Other signs and symptoms in breast: Secondary | ICD-10-CM | POA: Insufficient documentation

## 2012-01-22 LAB — 5 HIAA, QUANTITATIVE, URINE, 24 HOUR: 5-HIAA, 24 Hr Urine: 3.8 mg/24 h (ref <=6.0)

## 2012-01-22 LAB — SEROTONIN SERUM: Serotonin, Serum: 27 ng/mL (ref 26–165)

## 2012-01-27 ENCOUNTER — Encounter (HOSPITAL_COMMUNITY)
Admission: RE | Disposition: A | Discharge status: Home / Self Care | Payer: Self-pay | Source: Ambulatory Visit | Attending: Internal Medicine

## 2012-01-27 ENCOUNTER — Ambulatory Visit (HOSPITAL_COMMUNITY)
Admission: RE | Admit: 2012-01-27 | Discharge: 2012-01-27 | Disposition: A | Discharge status: Home / Self Care | Payer: Medicare Other | Source: Ambulatory Visit | Attending: Internal Medicine | Admitting: Internal Medicine

## 2012-01-27 ENCOUNTER — Encounter (HOSPITAL_COMMUNITY): Payer: Self-pay | Admitting: *Deleted

## 2012-01-27 DIAGNOSIS — C7A020 Malignant carcinoid tumor of the appendix: Secondary | ICD-10-CM | POA: Insufficient documentation

## 2012-01-27 HISTORY — PX: GIVENS CAPSULE STUDY: SHX5432

## 2012-01-27 SURGERY — IMAGING PROCEDURE, GI TRACT, INTRALUMINAL, VIA CAPSULE

## 2012-01-27 MED ORDER — ONDANSETRON 4 MG PO TBDP
ORAL_TABLET | ORAL | Status: AC
  Filled 2012-01-27: qty 1

## 2012-01-27 MED ORDER — ONDANSETRON 4 MG PO TBDP
4.0000 mg | ORAL_TABLET | Freq: Once | ORAL | Status: AC
  Administered 2012-01-27: 4 mg via ORAL

## 2012-01-29 ENCOUNTER — Encounter (HOSPITAL_COMMUNITY): Payer: Self-pay

## 2012-01-29 ENCOUNTER — Other Ambulatory Visit: Payer: Self-pay | Admitting: Internal Medicine

## 2012-01-29 ENCOUNTER — Encounter (HOSPITAL_COMMUNITY)
Admission: RE | Admit: 2012-01-29 | Discharge: 2012-01-29 | Disposition: A | Discharge status: Home / Self Care | Payer: Medicare Other | Source: Ambulatory Visit | Attending: General Surgery | Admitting: General Surgery

## 2012-01-29 ENCOUNTER — Telehealth: Payer: Self-pay | Admitting: Internal Medicine

## 2012-01-29 LAB — TYPE AND SCREEN: Antibody Screen: NEGATIVE

## 2012-01-29 LAB — DIFFERENTIAL
Basophils Relative: 0 % (ref 0–1)
Eosinophils Absolute: 0.5 10*3/uL (ref 0.0–0.7)
Monocytes Absolute: 0.7 10*3/uL (ref 0.1–1.0)
Monocytes Relative: 5 % (ref 3–12)
Neutrophils Relative %: 72 % (ref 43–77)

## 2012-01-29 LAB — CBC
Hemoglobin: 13.1 g/dL (ref 12.0–15.0)
MCH: 31.6 pg (ref 26.0–34.0)
MCHC: 35.6 g/dL (ref 30.0–36.0)

## 2012-01-29 NOTE — H&P (Signed)
[name] is an 51 y.o. female.   Chief Complaint: **Carcinoid tumor of appendix* HPI: *50yo wf who presents for partial colectomy.  Found to have carcinoid tumor incidentially on recent appendectomy for chronic appendicitis.  Referred by Dr. Mariel Sleet for definitive surgery.**  Past Medical History  Diagnosis Date  . Arthritis   . COPD (chronic obstructive pulmonary disease)   . Bronchitis   . Fibromyalgia   . Interstitial cystitis   . Migraine   . Depression with anxiety   . UTI (lower urinary tract infection)   . RLS (restless legs syndrome)   . Diverticulitis of colon (without mention of hemorrhage)   . Alopecia, unspecified   . Cervicalgia   . Tobacco use disorder   . PTSD (post-traumatic stress disorder)   . IBS (irritable bowel syndrome)   . GERD (gastroesophageal reflux disease)   . Dysphagia   . PONV (postoperative nausea and vomiting)   . Shortness of breath   . Kidney stones   . Anxiety   . Carcinoid tumor of appendix     Past Surgical History  Procedure Date  . Abdominal hysterectomy   . Cholecystectomy   . Tonsillectomy   . Cardiac catheterization   . Cystostomy w/ bladder dilation     x 52 [name]  . Esophageal dilation 01/01/2012    Procedure: ESOPHAGEAL DILATION;  Surgeon: [name] Ade, MD;  Location: [facility];  Service: Endoscopy;  Laterality: N/A;  maloney 55fr dialator  . Laparoscopic appendectomy 01/05/2012    Procedure: APPENDECTOMY LAPAROSCOPIC;  Surgeon: Dalia Heading;  Location: [facility];  Service: General;  Laterality: N/A;  . Appendectomy 01/05/2012    Family History  Problem Relation Age of Onset  . Coronary artery disease Mother   . Heart attack Mother   . Pulmonary embolism Mother   . Alcohol abuse Father   . Diabetes Father   . COPD Father   . Lung disease Father   . Anesthesia problems Neg Hx   . Hypotension Neg Hx   . Malignant hyperthermia Neg Hx   . Pseudochol deficiency Neg Hx    Social History:  reports that she has been  smoking Cigarettes.  She has a 1.6 pack-year smoking history. She has never used smokeless tobacco. She reports that she does not drink alcohol or use illicit drugs.  Allergies:  Allergies  Allergen Reactions  . Clarithromycin Itching and Nausea And Vomiting  . Nubain (Nalbuphine Hcl) Other (See Comments)    Deathly sick  . Pseudoephedrine Hives  . Sulfa Antibiotics Nausea And Vomiting  . Factive (Gemifloxacin Mesylate) Rash  . Prochlorperazine Edisylate Anxiety  . Promethazine Hcl Anxiety  . Tofranil-Pm Rash  . Trimethobenzamide Hcl Anxiety    No current facility-administered medications on file as of .   Medications Prior to Admission  Medication Sig Dispense Refill  . ACIPHEX 20 MG tablet Take 20 mg by mouth daily.       Marland Kitchen ALPRAZolam (XANAX) 1 MG tablet Take 1 mg by mouth at bedtime as needed. For sleep      . amphetamine-dextroamphetamine (ADDERALL) 20 MG tablet Take 1 capsule by mouth Once daily as needed. For attention      . cetirizine (ZYRTEC) 10 MG tablet Take 10 mg by mouth daily.        . cycloSPORINE (RESTASIS) 0.05 % ophthalmic emulsion Place 1 drop into both eyes 2 (two) times daily as needed. For dry eyes      . estradiol (VIVELLE-DOT) 0.1  MG/24HR Place 1 patch onto the skin 2 (two) times a week.        Marland Kitchen FLUoxetine (PROZAC) 40 MG capsule Take 40 mg by mouth daily.       . hyoscyamine (LEVSIN SL) 0.125 MG SL tablet Take 0.125 mg by mouth every 4 (four) hours as needed. For cramps      . ondansetron (ZOFRAN) 4 MG tablet Take 4 mg by mouth every 8 (eight) hours as needed. For nausea      . oxyCODONE-acetaminophen (PERCOCET) 10-325 MG per tablet Take 1 tablet by mouth every 4 (four) hours as needed for pain. For pain  40 tablet  0  . pramipexole (MIRAPEX) 0.5 MG tablet Take 0.5 mg by mouth at bedtime.       . topiramate (TOPAMAX) 50 MG tablet Take 25 mg by mouth daily.       . VENTOLIN HFA 108 (90 BASE) MCG/ACT inhaler Inhale 2 puffs into the lungs every 6 (six) hours as  needed. For shortness of breath        No results found for this or any previous visit (from the past 48 hour(s)). No results found.  Review of Systems  Constitutional: Positive for malaise/fatigue.  HENT: Negative.   Eyes: Negative.   Respiratory: Negative.   Cardiovascular: Negative.   Gastrointestinal: Positive for abdominal pain.  Genitourinary: Negative.   Musculoskeletal: Negative.   Skin: Negative.   Neurological: Negative.   Endo/Heme/Allergies: Negative.   Psychiatric/Behavioral: The patient is nervous/anxious.     Last menstrual period 12/29/1993. Physical Exam  Constitutional: She is oriented to person, place, and time. She appears well-developed and well-nourished.  HENT:  Head: Normocephalic and atraumatic.  Eyes: Pupils are equal, round, and reactive to light.  Neck: Normal range of motion. Neck supple.  Cardiovascular: Normal rate, regular rhythm and normal heart sounds.   Respiratory: Effort normal and breath sounds normal.  GI: Soft. Bowel sounds are normal. There is tenderness.       Incisions healing well.  Neurological: She is alert and oriented to person, place, and time.  Skin: Skin is warm and dry.     Assessment/Plan *Imp:  Carcinoid tumor of appendix.  Scheduled for Laparoscopic handassisted partial colectomy on 01/30/12.**  Trinitie Mcgirr A 01/29/2012, 11:48 AM

## 2012-01-29 NOTE — Telephone Encounter (Signed)
I reviewed the capsule study of the small bowel with a AS yesterday;  it does appear to be a normal study. I called the patient at home last evening and conveyed the results to her. In addition, also called Dr. Lovell Sheehan and passed information along. She is slated to have a right hemicolectomy in the near future.

## 2012-01-29 NOTE — Patient Instructions (Signed)
[name]  01/29/2012   Your procedure is scheduled on:  01/30/12  Report to [facility] at 10:45 AM.  Call this number if you have problems the morning of surgery: [phone]   Remember:   Do not eat food:After Midnight.  May have clear liquids:until Midnight .  Clear liquids include soda, tea, black coffee, apple or grape juice, broth.  Take these medicines the morning of surgery with A SIP OF WATER: Aciphex, Adderall, Cetirizine, Fluoxetine and Topamax. Take your Alprazolam, Oxycodone and Hyoscyamine only if needed. Also, take your Ventolin inhaler and bring it with you to the hospital.   Do not wear jewelry, make-up or nail polish.  Do not wear lotions, powders, or perfumes. You may wear deodorant.  Do not shave 48 hours prior to surgery.  Do not bring valuables to the hospital.  Contacts, dentures or bridgework may not be worn into surgery.  Leave suitcase in the car. After surgery it may be brought to your room.  For patients admitted to the hospital, checkout time is 11:00 AM the day of discharge.   Patients discharged the day of surgery will not be allowed to drive home.  Name and phone number of your driver:   Special Instructions: CHG Shower Use Special Wash: 1/2 bottle night before surgery and 1/2 bottle morning of surgery.   Please read over the following fact sheets that you were given: Pain Booklet, MRSA Information, Surgical Site Infection Prevention, Anesthesia Post-op Instructions and Care and Recovery After Surgery    Laparoscopic Colon Resection Laparoscopic colon resection is a relatively new procedure and is not performed in all centers. It may be done to remove a piece of the colon (large intestine) that may be sore and reddened (inflamed). It may be done to remove a portion of bowel that is blocked. The intestine may be blocked because of colon cancer. It is sometimes used to treat diseases of the bowel in which there are multiple small outgrowths from the bowel  wall (polyps), which may predispose a person to cancer. LET YOUR CAREGIVER KNOW ABOUT:  Allergies.   Medications taken including herbs, eye drops, over the counter medications, and creams.   Use of steroids (by mouth or creams).   Previous problems with anesthetics or novocaine.   Possibility of pregnancy, if this applies.   History of blood clots (thrombophlebitis).   History of bleeding or blood problems.   Previous surgery.   Other health problems.  RISKS AND COMPLICATIONS Some problems, which occur following this procedure, include:  Infection: A germ starts growing in the wound. This can usually be treated with medicine that kills germs (antibiotics).   Bleeding following surgery may be a complication of almost all surgeries. Your surgeon takes every precaution to keep this from happening.   Damage to other organs may occur. If damage to other organs or excessive bleeding should occur it may be necessary to convert the laparoscopic procedure into an open abdominal (belly) procedure. This means the surgery is performed by opening the abdomen and performing the surgery under direct vision. Scarring from previous surgeries or disease may also be a cause to change this procedure to an open abdominal operation.   Sometimes a leak can occur in the line where the bowel was sewn together after the portion of bowel was removed.   It is possible for the bowel to become obstructed in the area where it was sewn together. When this happens, it is sometimes necessary to operate  again to repair this. This may be accomplished using the laparoscope or opening the abdomen and operating in the usual manner without the laparoscope.  BEFORE THE PROCEDURE You should be present 2 hours prior to your procedure or as instructed.  PROCEDURE  Laparoscopic means a laparoscope (a small pencil sized telescope) is used. You are made to sleep with medicine (anesthetized). Your surgeon inflates your belly  (abdomen) with a needle like device (trocar and cannula). The inflation is done with a harmless gas (carbon dioxide). This makes your organs easier to see. The laparoscope is inserted into your abdomen through a small slit (incision) that allows your surgeon to see into the abdomen. Other small instruments, such as probes and operating instruments, are inserted into the abdomen through other small openings (ports). These ports allow the surgeon to perform the operation. Often surgeons attach a video camera to the laparoscope to enlarge the view. During the procedure the portion of bowel to be removed is taken out through one of the ports. A port may have to be enlarged if the bowel is too large to be removed. In this case a small incision will be made and some times the bowel is reconnected (anastamosis) outside the abdomen. After the procedure, the gas is released, and your incisions are closed with stitches (sutures). Because these incisions are small (usually less than one-half inch), there is usually minimal discomfort following the procedure. AFTER THE PROCEDURE The recovery time, if there are no problems, is shortened compared to regular surgery. You will rest in a recovery room until you are stable and doing well. Following this, barring other problems you will be allowed to return to your room. Recovery times vary depending on what is found at surgery, the age of the patient, general health, etc. SEEK IMMEDIATE MEDICAL CARE IF:   There is redness, swelling, or increasing pain in the wound area.   Pus is coming from the wound.   An unexplained oral temperature above 102 F (38.9 C) develops or as directed.   You notice a foul smell coming from the wound or dressing.   There is a breaking open of a wound (edges not staying together) after sutures have been removed.   You develop increasing abdominal pain.  Document Released: 03/07/2003 Document Revised: 08/27/2011 Document Reviewed:  01/14/2008 [facility] Patient Information 2012 [place], [place].

## 2012-01-30 ENCOUNTER — Encounter (HOSPITAL_COMMUNITY): Payer: Self-pay | Admitting: Anesthesiology

## 2012-01-30 ENCOUNTER — Encounter (HOSPITAL_COMMUNITY)
Admission: RE | Disposition: A | Discharge status: Home / Self Care | Payer: Self-pay | Source: Ambulatory Visit | Attending: General Surgery

## 2012-01-30 ENCOUNTER — Other Ambulatory Visit: Payer: Self-pay | Admitting: General Surgery

## 2012-01-30 ENCOUNTER — Ambulatory Visit (HOSPITAL_COMMUNITY): Payer: Medicare Other | Admitting: Anesthesiology

## 2012-01-30 ENCOUNTER — Encounter (HOSPITAL_COMMUNITY): Payer: Self-pay | Admitting: *Deleted

## 2012-01-30 ENCOUNTER — Inpatient Hospital Stay (HOSPITAL_COMMUNITY)
Admission: RE | Admit: 2012-01-30 | Discharge: 2012-02-04 | DRG: 330 | Disposition: A | Discharge status: Home / Self Care | Payer: Medicare Other | Source: Ambulatory Visit | Attending: General Surgery | Admitting: General Surgery

## 2012-01-30 DIAGNOSIS — F341 Dysthymic disorder: Secondary | ICD-10-CM | POA: Diagnosis present

## 2012-01-30 DIAGNOSIS — G2581 Restless legs syndrome: Secondary | ICD-10-CM | POA: Diagnosis present

## 2012-01-30 DIAGNOSIS — K449 Diaphragmatic hernia without obstruction or gangrene: Secondary | ICD-10-CM | POA: Diagnosis present

## 2012-01-30 DIAGNOSIS — J449 Chronic obstructive pulmonary disease, unspecified: Secondary | ICD-10-CM | POA: Diagnosis present

## 2012-01-30 DIAGNOSIS — J4489 Other specified chronic obstructive pulmonary disease: Secondary | ICD-10-CM | POA: Diagnosis present

## 2012-01-30 DIAGNOSIS — B009 Herpesviral infection, unspecified: Secondary | ICD-10-CM | POA: Diagnosis not present

## 2012-01-30 DIAGNOSIS — F4310 Post-traumatic stress disorder, unspecified: Secondary | ICD-10-CM | POA: Diagnosis present

## 2012-01-30 DIAGNOSIS — K219 Gastro-esophageal reflux disease without esophagitis: Secondary | ICD-10-CM | POA: Diagnosis present

## 2012-01-30 DIAGNOSIS — M129 Arthropathy, unspecified: Secondary | ICD-10-CM | POA: Diagnosis present

## 2012-01-30 DIAGNOSIS — K589 Irritable bowel syndrome without diarrhea: Secondary | ICD-10-CM | POA: Diagnosis present

## 2012-01-30 DIAGNOSIS — J209 Acute bronchitis, unspecified: Secondary | ICD-10-CM | POA: Diagnosis not present

## 2012-01-30 DIAGNOSIS — F17200 Nicotine dependence, unspecified, uncomplicated: Secondary | ICD-10-CM | POA: Diagnosis present

## 2012-01-30 DIAGNOSIS — D3A020 Benign carcinoid tumor of the appendix: Principal | ICD-10-CM | POA: Diagnosis present

## 2012-01-30 DIAGNOSIS — IMO0001 Reserved for inherently not codable concepts without codable children: Secondary | ICD-10-CM | POA: Diagnosis present

## 2012-01-30 DIAGNOSIS — R1310 Dysphagia, unspecified: Secondary | ICD-10-CM | POA: Diagnosis present

## 2012-01-30 DIAGNOSIS — Z79899 Other long term (current) drug therapy: Secondary | ICD-10-CM

## 2012-01-30 DIAGNOSIS — D649 Anemia, unspecified: Secondary | ICD-10-CM | POA: Diagnosis not present

## 2012-01-30 DIAGNOSIS — J440 Chronic obstructive pulmonary disease with acute lower respiratory infection: Secondary | ICD-10-CM | POA: Diagnosis not present

## 2012-01-30 HISTORY — PX: PARTIAL COLECTOMY: SHX5273

## 2012-01-30 HISTORY — PX: COLON RESECTION: SHX5231

## 2012-01-30 SURGERY — COLECTOMY, HAND-ASSISTED, LAPAROSCOPIC
Anesthesia: General | Wound class: Clean Contaminated

## 2012-01-30 MED ORDER — CYCLOSPORINE 0.05 % OP EMUL
1.0000 [drp] | Freq: Two times a day (BID) | OPHTHALMIC | Status: DC | PRN
  Filled 2012-01-30: qty 1

## 2012-01-30 MED ORDER — LIDOCAINE HCL (PF) 1 % IJ SOLN
INTRAMUSCULAR | Status: AC
  Filled 2012-01-30: qty 5

## 2012-01-30 MED ORDER — ROCURONIUM BROMIDE 50 MG/5ML IV SOLN
INTRAVENOUS | Status: AC
  Filled 2012-01-30: qty 1

## 2012-01-30 MED ORDER — ALPRAZOLAM 1 MG PO TABS
1.0000 mg | ORAL_TABLET | Freq: Three times a day (TID) | ORAL | Status: DC | PRN
  Administered 2012-01-31 – 2012-02-04 (×9): 1 mg via ORAL
  Filled 2012-01-30 (×9): qty 1

## 2012-01-30 MED ORDER — ACETAMINOPHEN 325 MG PO TABS: 325.0000 mg | ORAL_TABLET | ORAL | Status: DC | PRN

## 2012-01-30 MED ORDER — POVIDONE-IODINE 10 % EX OINT
TOPICAL_OINTMENT | CUTANEOUS | Status: AC
  Filled 2012-01-30: qty 2

## 2012-01-30 MED ORDER — ARTIFICIAL TEARS OP OINT
TOPICAL_OINTMENT | OPHTHALMIC | Status: AC
  Filled 2012-01-30: qty 3.5

## 2012-01-30 MED ORDER — ONDANSETRON HCL 4 MG/2ML IJ SOLN: 4.0000 mg | Freq: Four times a day (QID) | INTRAMUSCULAR | Status: DC | PRN

## 2012-01-30 MED ORDER — ONDANSETRON HCL 4 MG/2ML IJ SOLN
INTRAMUSCULAR | Status: AC
  Administered 2012-01-30: 4 mg via INTRAVENOUS
  Filled 2012-01-30: qty 2

## 2012-01-30 MED ORDER — FENTANYL CITRATE 0.05 MG/ML IJ SOLN
INTRAMUSCULAR | Status: AC
  Administered 2012-01-30: 50 ug via INTRAVENOUS
  Filled 2012-01-30: qty 2

## 2012-01-30 MED ORDER — ALVIMOPAN 12 MG PO CAPS
12.0000 mg | ORAL_CAPSULE | Freq: Once | ORAL | Status: AC
  Administered 2012-01-30: 12 mg via ORAL

## 2012-01-30 MED ORDER — FLUOXETINE HCL 20 MG PO CAPS
40.0000 mg | ORAL_CAPSULE | Freq: Every day | ORAL | Status: DC
  Administered 2012-01-30 – 2012-02-04 (×6): 40 mg via ORAL
  Filled 2012-01-30 (×6): qty 2

## 2012-01-30 MED ORDER — MIDAZOLAM HCL 2 MG/2ML IJ SOLN
1.0000 mg | INTRAMUSCULAR | Status: DC | PRN
  Administered 2012-01-30 (×2): 2 mg via INTRAVENOUS

## 2012-01-30 MED ORDER — ENOXAPARIN SODIUM 40 MG/0.4ML ~~LOC~~ SOLN
40.0000 mg | SUBCUTANEOUS | Status: DC
  Administered 2012-01-31 – 2012-02-04 (×5): 40 mg via SUBCUTANEOUS
  Filled 2012-01-30 (×5): qty 0.4

## 2012-01-30 MED ORDER — ENOXAPARIN SODIUM 40 MG/0.4ML ~~LOC~~ SOLN
SUBCUTANEOUS | Status: AC
  Administered 2012-01-30: 40 mg via SUBCUTANEOUS
  Filled 2012-01-30: qty 0.4

## 2012-01-30 MED ORDER — BUPIVACAINE HCL (PF) 0.5 % IJ SOLN
INTRAMUSCULAR | Status: DC | PRN
  Administered 2012-01-30: 10 mL

## 2012-01-30 MED ORDER — DIPHENHYDRAMINE HCL 12.5 MG/5ML PO ELIX
12.5000 mg | ORAL_SOLUTION | Freq: Four times a day (QID) | ORAL | Status: DC | PRN
  Administered 2012-02-01 – 2012-02-02 (×2): 12.5 mg via ORAL
  Filled 2012-01-30 (×2): qty 5

## 2012-01-30 MED ORDER — LACTATED RINGERS IV SOLN
INTRAVENOUS | Status: DC
  Administered 2012-01-30 – 2012-01-31 (×2): via INTRAVENOUS

## 2012-01-30 MED ORDER — HEMOSTATIC AGENTS (NO CHARGE) OPTIME
TOPICAL | Status: DC | PRN
  Administered 2012-01-30: 1 via TOPICAL

## 2012-01-30 MED ORDER — ERTAPENEM SODIUM 1 G IJ SOLR: 1.0000 g | INTRAMUSCULAR | Status: DC

## 2012-01-30 MED ORDER — FENTANYL CITRATE 0.05 MG/ML IJ SOLN
25.0000 ug | INTRAMUSCULAR | Status: DC | PRN
  Administered 2012-01-30 (×2): 50 ug via INTRAVENOUS

## 2012-01-30 MED ORDER — GLYCOPYRROLATE 0.2 MG/ML IJ SOLN
INTRAMUSCULAR | Status: AC
  Filled 2012-01-30: qty 1

## 2012-01-30 MED ORDER — NEOSTIGMINE METHYLSULFATE 1 MG/ML IJ SOLN
INTRAMUSCULAR | Status: DC | PRN
  Administered 2012-01-30: 3 mg via INTRAVENOUS

## 2012-01-30 MED ORDER — HYDROMORPHONE HCL PF 1 MG/ML IJ SOLN
1.0000 mg | INTRAMUSCULAR | Status: DC | PRN
  Administered 2012-01-30: 1 mg via INTRAVENOUS
  Filled 2012-01-30: qty 1

## 2012-01-30 MED ORDER — ROCURONIUM BROMIDE 100 MG/10ML IV SOLN
INTRAVENOUS | Status: DC | PRN
  Administered 2012-01-30: 40 mg via INTRAVENOUS

## 2012-01-30 MED ORDER — FENTANYL CITRATE 0.05 MG/ML IJ SOLN
INTRAMUSCULAR | Status: DC | PRN
  Administered 2012-01-30: 100 ug via INTRAVENOUS
  Administered 2012-01-30: 50 ug via INTRAVENOUS
  Administered 2012-01-30: 100 ug via INTRAVENOUS
  Administered 2012-01-30 (×5): 50 ug via INTRAVENOUS
  Administered 2012-01-30: 100 ug via INTRAVENOUS

## 2012-01-30 MED ORDER — SODIUM CHLORIDE 0.9 % IR SOLN
Status: DC | PRN
  Administered 2012-01-30: 1000 mL

## 2012-01-30 MED ORDER — SODIUM CHLORIDE 0.9 % IV SOLN
INTRAVENOUS | Status: AC
  Filled 2012-01-30: qty 1

## 2012-01-30 MED ORDER — ONDANSETRON HCL 4 MG/2ML IJ SOLN
INTRAMUSCULAR | Status: DC | PRN
  Administered 2012-01-30: 4 mg via INTRAVENOUS

## 2012-01-30 MED ORDER — MIDAZOLAM HCL 5 MG/5ML IJ SOLN
INTRAMUSCULAR | Status: DC | PRN
  Administered 2012-01-30: 2 mg via INTRAVENOUS

## 2012-01-30 MED ORDER — FENTANYL CITRATE 0.05 MG/ML IJ SOLN
INTRAMUSCULAR | Status: AC
  Filled 2012-01-30: qty 2

## 2012-01-30 MED ORDER — ACETAMINOPHEN 10 MG/ML IV SOLN
1000.0000 mg | Freq: Four times a day (QID) | INTRAVENOUS | Status: AC
  Administered 2012-01-30 – 2012-01-31 (×4): 1000 mg via INTRAVENOUS
  Filled 2012-01-30 (×3): qty 100

## 2012-01-30 MED ORDER — ENOXAPARIN SODIUM 40 MG/0.4ML ~~LOC~~ SOLN
40.0000 mg | Freq: Once | SUBCUTANEOUS | Status: AC
  Administered 2012-01-30: 40 mg via SUBCUTANEOUS

## 2012-01-30 MED ORDER — POVIDONE-IODINE 10 % OINT PACKET
TOPICAL_OINTMENT | CUTANEOUS | Status: DC | PRN
  Administered 2012-01-30: 1 via TOPICAL

## 2012-01-30 MED ORDER — DIPHENHYDRAMINE HCL 50 MG/ML IJ SOLN
12.5000 mg | Freq: Four times a day (QID) | INTRAMUSCULAR | Status: DC | PRN
  Administered 2012-01-31 – 2012-02-04 (×6): 12.5 mg via INTRAVENOUS
  Filled 2012-01-30 (×6): qty 1

## 2012-01-30 MED ORDER — ONDANSETRON HCL 4 MG/2ML IJ SOLN
4.0000 mg | Freq: Once | INTRAMUSCULAR | Status: AC
  Administered 2012-01-30: 4 mg via INTRAVENOUS

## 2012-01-30 MED ORDER — NALOXONE HCL 0.4 MG/ML IJ SOLN: 0.4000 mg | INTRAMUSCULAR | Status: DC | PRN

## 2012-01-30 MED ORDER — PROPOFOL 10 MG/ML IV BOLUS
INTRAVENOUS | Status: DC | PRN
  Administered 2012-01-30: 150 mg via INTRAVENOUS

## 2012-01-30 MED ORDER — ONDANSETRON HCL 4 MG/2ML IJ SOLN
4.0000 mg | Freq: Four times a day (QID) | INTRAMUSCULAR | Status: DC | PRN
  Administered 2012-01-30 – 2012-02-04 (×6): 4 mg via INTRAVENOUS
  Filled 2012-01-30 (×6): qty 2

## 2012-01-30 MED ORDER — FENTANYL CITRATE 0.05 MG/ML IJ SOLN
INTRAMUSCULAR | Status: AC
  Filled 2012-01-30: qty 5

## 2012-01-30 MED ORDER — MIDAZOLAM HCL 2 MG/2ML IJ SOLN
INTRAMUSCULAR | Status: AC
  Filled 2012-01-30: qty 2

## 2012-01-30 MED ORDER — PROPOFOL 10 MG/ML IV EMUL
INTRAVENOUS | Status: AC
  Filled 2012-01-30: qty 20

## 2012-01-30 MED ORDER — ONDANSETRON HCL 4 MG/2ML IJ SOLN
INTRAMUSCULAR | Status: AC
  Filled 2012-01-30: qty 2

## 2012-01-30 MED ORDER — ALVIMOPAN 12 MG PO CAPS
ORAL_CAPSULE | ORAL | Status: AC
  Filled 2012-01-30: qty 1

## 2012-01-30 MED ORDER — HYDROMORPHONE HCL PF 1 MG/ML IJ SOLN
2.0000 mg | INTRAMUSCULAR | Status: DC | PRN
  Administered 2012-01-30 – 2012-02-04 (×32): 2 mg via INTRAVENOUS
  Filled 2012-01-30: qty 2
  Filled 2012-01-30: qty 1
  Filled 2012-01-30 (×12): qty 2
  Filled 2012-01-30: qty 1
  Filled 2012-01-30 (×13): qty 2
  Filled 2012-01-30: qty 1
  Filled 2012-01-30 (×4): qty 2

## 2012-01-30 MED ORDER — ALVIMOPAN 12 MG PO CAPS
12.0000 mg | ORAL_CAPSULE | Freq: Two times a day (BID) | ORAL | Status: DC
  Administered 2012-01-31 – 2012-02-04 (×9): 12 mg via ORAL
  Filled 2012-01-30 (×9): qty 1

## 2012-01-30 MED ORDER — ALVIMOPAN 12 MG PO CAPS
ORAL_CAPSULE | ORAL | Status: AC
  Administered 2012-01-30: 12 mg via ORAL
  Filled 2012-01-30: qty 1

## 2012-01-30 MED ORDER — ONDANSETRON HCL 4 MG PO TABS: 4.0000 mg | ORAL_TABLET | Freq: Four times a day (QID) | ORAL | Status: DC | PRN

## 2012-01-30 MED ORDER — ESTRADIOL 0.1 MG/24HR TD PTWK
0.1000 mg | MEDICATED_PATCH | TRANSDERMAL | Status: DC
  Administered 2012-02-02: 0.1 mg via TRANSDERMAL
  Filled 2012-01-30 (×2): qty 1

## 2012-01-30 MED ORDER — LACTATED RINGERS IV SOLN
INTRAVENOUS | Status: DC
  Administered 2012-01-30: 13:00:00 via INTRAVENOUS
  Administered 2012-01-30: 1000 mL via INTRAVENOUS

## 2012-01-30 MED ORDER — ONDANSETRON HCL 4 MG/2ML IJ SOLN: 4.0000 mg | Freq: Once | INTRAMUSCULAR | Status: DC | PRN

## 2012-01-30 MED ORDER — BUPIVACAINE HCL (PF) 0.5 % IJ SOLN
INTRAMUSCULAR | Status: AC
  Filled 2012-01-30: qty 30

## 2012-01-30 MED ORDER — ACETAMINOPHEN 10 MG/ML IV SOLN
INTRAVENOUS | Status: AC
  Filled 2012-01-30: qty 100

## 2012-01-30 MED ORDER — FENTANYL 10 MCG/ML IV SOLN
INTRAVENOUS | Status: DC
  Administered 2012-01-30: 22:00:00 via INTRAVENOUS
  Administered 2012-01-31: 130 ug via INTRAVENOUS
  Administered 2012-01-31: 05:00:00 via INTRAVENOUS
  Administered 2012-01-31: 280 ug via INTRAVENOUS
  Filled 2012-01-30 (×3): qty 50

## 2012-01-30 MED ORDER — MIDAZOLAM HCL 2 MG/2ML IJ SOLN
INTRAMUSCULAR | Status: AC
  Administered 2012-01-30: 2 mg via INTRAVENOUS
  Filled 2012-01-30: qty 4

## 2012-01-30 MED ORDER — SODIUM CHLORIDE 0.9 % IJ SOLN
9.0000 mL | INTRAMUSCULAR | Status: DC | PRN
  Filled 2012-01-30: qty 3
  Filled 2012-01-30: qty 6

## 2012-01-30 MED ORDER — LIDOCAINE HCL 1 % IJ SOLN
INTRAMUSCULAR | Status: DC | PRN
  Administered 2012-01-30: 50 mg via INTRADERMAL

## 2012-01-30 MED ORDER — GLYCOPYRROLATE 0.2 MG/ML IJ SOLN
INTRAMUSCULAR | Status: DC | PRN
  Administered 2012-01-30: .4 mg via INTRAVENOUS

## 2012-01-30 MED ORDER — GLYCOPYRROLATE 0.2 MG/ML IJ SOLN
0.2000 mg | Freq: Once | INTRAMUSCULAR | Status: AC
  Administered 2012-01-30: 0.2 mg via INTRAVENOUS

## 2012-01-30 MED ORDER — GLYCOPYRROLATE 0.2 MG/ML IJ SOLN
INTRAMUSCULAR | Status: AC
  Administered 2012-01-30: 0.2 mg via INTRAVENOUS
  Filled 2012-01-30: qty 1

## 2012-01-30 SURGICAL SUPPLY — 73 items
BAG HAMPER (MISCELLANEOUS) ×2 IMPLANT
BLADE HEX COATED 2.75 (ELECTRODE) ×2 IMPLANT
BLADE SURG SZ10 CARB STEEL (BLADE) ×2 IMPLANT
CLOTH BEACON ORANGE TIMEOUT ST (SAFETY) ×2 IMPLANT
COVER LIGHT HANDLE STERIS (MISCELLANEOUS) ×4 IMPLANT
CUTTER ENDO LINEAR 45M (STAPLE) ×2 IMPLANT
DECANTER SPIKE VIAL GLASS SM (MISCELLANEOUS) IMPLANT
DRAPE INCISE IOBAN 44X35 STRL (DRAPES) ×2 IMPLANT
DRAPE WARM FLUID 44X44 (DRAPE) ×2 IMPLANT
DURAPREP 26ML APPLICATOR (WOUND CARE) ×2 IMPLANT
ELECT BLADE 6 FLAT ULTRCLN (ELECTRODE) IMPLANT
ELECT REM PT RETURN 9FT ADLT (ELECTROSURGICAL) ×2
ELECTRODE REM PT RTRN 9FT ADLT (ELECTROSURGICAL) ×1 IMPLANT
FILTER SMOKE EVAC LAPAROSHD (FILTER) ×2 IMPLANT
GLOVE BIO SURGEON STRL SZ 6.5 (GLOVE) ×2 IMPLANT
GLOVE BIO SURGEON STRL SZ7.5 (GLOVE) ×2 IMPLANT
GLOVE BIO SURGEON STRL SZ8 (GLOVE) ×2 IMPLANT
GLOVE BIOGEL PI IND STRL 7.0 (GLOVE) ×2 IMPLANT
GLOVE BIOGEL PI IND STRL 7.5 (GLOVE) ×2 IMPLANT
GLOVE BIOGEL PI INDICATOR 7.0 (GLOVE) ×2
GLOVE BIOGEL PI INDICATOR 7.5 (GLOVE) ×2
GLOVE ECLIPSE 6.5 STRL STRAW (GLOVE) ×2 IMPLANT
GLOVE ECLIPSE 7.0 STRL STRAW (GLOVE) ×4 IMPLANT
GLOVE EXAM NITRILE MD LF STRL (GLOVE) ×2 IMPLANT
GOWN STRL REIN XL XLG (GOWN DISPOSABLE) ×10 IMPLANT
INST SET LAPROSCOPIC AP (KITS) ×2 IMPLANT
INST SET MAJOR GENERAL (KITS) ×2 IMPLANT
IV NS IRRIG 3000ML ARTHROMATIC (IV SOLUTION) IMPLANT
KIT ROOM TURNOVER AP CYSTO (KITS) IMPLANT
LIGASURE 5MM LAPAROSCOPIC (INSTRUMENTS) IMPLANT
LIGASURE LAP ATLAS 10MM 37CM (INSTRUMENTS) ×2 IMPLANT
MANIFOLD NEPTUNE II (INSTRUMENTS) ×2 IMPLANT
NEEDLE HYPO 18GX1.5 BLUNT FILL (NEEDLE) IMPLANT
NS IRRIG 1000ML POUR BTL (IV SOLUTION) ×4 IMPLANT
PACK LAP CHOLE LZT030E (CUSTOM PROCEDURE TRAY) ×2 IMPLANT
PAD ARMBOARD 7.5X6 YLW CONV (MISCELLANEOUS) ×2 IMPLANT
PENCIL HANDSWITCHING (ELECTRODE) ×2 IMPLANT
RELOAD LINEAR CUT PROX 55 BLUE (ENDOMECHANICALS) IMPLANT
RELOAD PROXIMATE 75MM BLUE (ENDOMECHANICALS) ×2 IMPLANT
RELOAD STAPLE TA45 3.5 REG BLU (ENDOMECHANICALS) ×2 IMPLANT
SEALER TISSUE G2 CVD JAW 35 (ENDOMECHANICALS) IMPLANT
SEALER TISSUE G2 CVD JAW 45CM (ENDOMECHANICALS)
SET BASIN LINEN APH (SET/KITS/TRAYS/PACK) ×2 IMPLANT
SET TUBE IRRIG SUCTION NO TIP (IRRIGATION / IRRIGATOR) IMPLANT
SHEET LAVH (DRAPES) IMPLANT
SPONGE GAUZE 2X2 8PLY STRL LF (GAUZE/BANDAGES/DRESSINGS) IMPLANT
SPONGE GAUZE 4X4 12PLY (GAUZE/BANDAGES/DRESSINGS) ×2 IMPLANT
SPONGE LAP 18X18 X RAY DECT (DISPOSABLE) ×4 IMPLANT
STAPLER GUN LINEAR PROX 60 (STAPLE) ×2 IMPLANT
STAPLER PROXIMATE 55 BLUE (STAPLE) IMPLANT
STAPLER PROXIMATE 75MM BLUE (STAPLE) ×2 IMPLANT
STAPLER VISISTAT (STAPLE) ×2 IMPLANT
SUCTION POOLE TIP (SUCTIONS) ×2 IMPLANT
SUT CHROMIC 0 CT 1 (SUTURE) IMPLANT
SUT CHROMIC 2 0 SH (SUTURE) ×2 IMPLANT
SUT CHROMIC 3 0 SH 27 (SUTURE) IMPLANT
SUT PDS AB CT VIOLET #0 27IN (SUTURE) ×4 IMPLANT
SUT SILK 2 0 (SUTURE)
SUT SILK 2-0 18XBRD TIE 12 (SUTURE) IMPLANT
SUT SILK 3 0 SH CR/8 (SUTURE) ×4 IMPLANT
SUT VIC AB 0 CT1 27 (SUTURE) ×1
SUT VIC AB 0 CT1 27XCR 8 STRN (SUTURE) ×1 IMPLANT
SUT VIC AB 2-0 CT2 27 (SUTURE) IMPLANT
SYS LAPSCP GELPORT 120MM (MISCELLANEOUS) ×2
SYS OPTICAL KII 11X100 THREAD (TROCAR) ×2 IMPLANT
SYSTEM LAPSCP GELPORT 120MM (MISCELLANEOUS) ×1 IMPLANT
TAPE CLOTH SURG 4X10 WHT LF (GAUZE/BANDAGES/DRESSINGS) ×2 IMPLANT
TRAY FOLEY CATH 14FR (SET/KITS/TRAYS/PACK) ×2 IMPLANT
TROCAR Z-THAD FIOS HNDL 12X100 (TROCAR) ×2 IMPLANT
TROCAR Z-THREAD OPTICAL 12X100 (TROCAR) ×2 IMPLANT
TUBING HI FLO HEAT INSUFFLATOR (IRRIGATION / IRRIGATOR) ×2 IMPLANT
WARMER LAPAROSCOPE (MISCELLANEOUS) ×2 IMPLANT
YANKAUER SUCT BULB TIP 10FT TU (MISCELLANEOUS) ×2 IMPLANT

## 2012-01-30 NOTE — Op Note (Signed)
Patient:  [name]  DOB:  August 08, 1961  MRN:  [id number]   Preop Diagnosis:  Carcinoid tumor of appendix  Postop Diagnosis:  Same  Procedure:  Laparoscopic hand-assisted right hemicolectomy  Surgeon:  Franky Macho, M.D.  Anes:  General endotracheal  Indications:  Patient is a 51 year old white female status post an incidental laparoscopic appendectomy for chronic appendicitis was found on final pathology did have a carcinoid tumor the appendix. After consultation with Dr. Mariel Sleet of oncology, he felt the patient should have a right hemicolectomy given her age and the difficulty in following this. She underwent the Givens capsule study which was negative for any other mass lesions in the small bowel. The risks and benefits of the procedure clean bleeding, infection, possibly of an open procedure were fully explained to the patient, gave informed consent.  Procedure note:  Patient is placed the supine position. After induction of general endotracheal anesthesia, the abdomen was prepped and draped using usual sterile technique with DuraPrep. Surgical site confirmation was performed.  A midline incision was made from the umbilicus down superiorly. The peritoneal cavity was entered into without difficulty. GelPort was then inserted. An additional 11 mm trocar was placed left upper quadrant region and a 12 mm trocar was placed left flank region. Liver was inspected and noted within normal limits. The right colon was mobilized along its peritoneal reflection. The hepatic colic ligament was then divided using the LigaSure. An Endo GIA was placed across the terminal ileum and fired. The right colon mesentery up to the proximal transverse colon was then divided using the LigaSure. The specimen was then exteriorized and the proximal transverse colon was divided using a GIA stapler. A side to side ileocolic anastomosis was then performed using a GIA 70 stapler. The enterotomy was closed in a TA 60  stapler. The staple line was bolstered using 3-0 silk sutures. Omentum was then used to cover the anastomotic line. The mesenteric defect was closed using a 2-0 chromic running suture. The bowel was returned to the abdominal cavity an orderly fashion. The abdomen was copious irrigated normal saline. Surgicel is placed in the right upper quadrant where the hepatic colic ligament was dissected. All fluid and air were then evacuated from the abdominal cavity prior to removal of the trochars.  All wounds were gave normal saline. All wounds were injected with 0.5% Sensorcaine. The GelPort site fascia was reapproximated using an 0 PDS running suture. All skin incisions were closed using staples. Betadine ointment dorsal dressings were applied.  All tape and needle counts were correct the end of the procedure. Patient was extubated in the operating room and went back to recovery room awake in stable condition.  Complications:  None  EBL:  175 cc  Specimen:  Right colon

## 2012-01-30 NOTE — Transfer of Care (Signed)
Immediate Anesthesia Transfer of Care Note  Patient: [name]  Procedure(s) Performed:  HAND ASSISTED LAPAROSCOPIC COLON RESECTION; PARTIAL COLECTOMY  Patient Location: PACU  Anesthesia Type: General  Level of Consciousness: awake, alert , oriented and patient cooperative  Airway & Oxygen Therapy: Patient Spontanous Breathing and Patient connected to nasal cannula oxygen  Post-op Assessment: Report given to PACU RN and Post -op Vital signs reviewed and stable  Post vital signs: Reviewed and stable  Complications: No apparent anesthesia complications

## 2012-01-30 NOTE — Addendum Note (Signed)
Addendum  created 01/30/12 1419 by Saniyah Mondesir Andraza, CRNA   Modules edited:Anesthesia Events, Anesthesia Medication Administration    

## 2012-01-30 NOTE — Interval H&P Note (Signed)
History and Physical Interval Note:  01/30/2012 11:05 AM  [name]  has presented today for surgery, with the diagnosis of Colon neoplasm  The various methods of treatment have been discussed with the patient and family. After consideration of risks, benefits and other options for treatment, the patient has consented to  Procedure(s): HAND ASSISTED LAPAROSCOPIC COLON RESECTION as a surgical intervention .  The patients' history has been reviewed, patient examined, no change in status, stable for surgery.  I have reviewed the patients' chart and labs.  Questions were answered to the patient's satisfaction.     [name]

## 2012-01-30 NOTE — Progress Notes (Signed)
Meagan unable to take report.

## 2012-01-30 NOTE — Progress Notes (Signed)
Ready to call report to floor. meagan refused to accept report at this time. Will call back.

## 2012-01-30 NOTE — Anesthesia Postprocedure Evaluation (Addendum)
  Anesthesia Post-op Note  Patient: [name]  Procedure(s) Performed:  HAND ASSISTED LAPAROSCOPIC COLON RESECTION; PARTIAL COLECTOMY  Patient Location: PACU  Anesthesia Type: General  Level of Consciousness: awake, alert , oriented and patient cooperative  Airway and Oxygen Therapy: Patient Spontanous Breathing and Patient connected to face mask oxygen  Post-op Pain: moderate  Post-op Assessment: Post-op Vital signs reviewed, Patient's Cardiovascular Status Stable, Respiratory Function Stable and Patent Airway  Post-op Vital Signs: Reviewed and stable  Complications: No apparent anesthesia complications 01/30/2011  Patient drowsy, but oriented and appropriate.  VSS.  Denies recall, sore throat.  No apparent anesthesia complications.

## 2012-01-30 NOTE — Anesthesia Procedure Notes (Signed)
Procedure Name: Intubation Date/Time: 01/30/2012 11:57 AM Performed by: Glynn Octave Pre-anesthesia Checklist: Patient identified, Patient being monitored, Timeout performed, Emergency Drugs available and Suction available Patient Re-evaluated:Patient Re-evaluated prior to inductionOxygen Delivery Method: Circle System Utilized Preoxygenation: Pre-oxygenation with 100% oxygen Intubation Type: IV induction, Rapid sequence and Cricoid Pressure applied Laryngoscope Size: Mac and 3 Grade View: Grade II Tube type: Oral Tube size: 7.0 mm Number of attempts: 1 Airway Equipment and Method: stylet Placement Confirmation: ETT inserted through vocal cords under direct vision,  positive ETCO2 and breath sounds checked- equal and bilateral Secured at: 21 cm Tube secured with: Tape Dental Injury: Teeth and Oropharynx as per pre-operative assessment

## 2012-01-30 NOTE — Anesthesia Preprocedure Evaluation (Signed)
Anesthesia Evaluation  Patient identified by MRN, date of birth, ID band Patient awake    Reviewed: Allergy & Precautions, H&P , NPO status , Patient's Chart, lab work & pertinent test results  History of Anesthesia Complications (+) PONV  Airway Mallampati: I TM Distance: >3 FB Neck ROM: Full    Dental No notable dental hx. (+) Teeth Intact   Pulmonary shortness of breath, asthma , COPD   Pulmonary exam normal       Cardiovascular neg cardio ROS Regular Normal    Neuro/Psych  Headaches, PSYCHIATRIC DISORDERS Anxiety Depression  Neuromuscular disease    GI/Hepatic Neg liver ROS, hiatal hernia, GERD-  Medicated and Controlled,  Endo/Other  Negative Endocrine ROS  Renal/GU negative Renal ROS     Musculoskeletal  (+) Fibromyalgia -  Abdominal Normal abdominal exam  (+)   Peds  Hematology negative hematology ROS (+)   Anesthesia Other Findings   Reproductive/Obstetrics negative OB ROS                           Anesthesia Physical Anesthesia Plan  ASA: III  Anesthesia Plan: General   Post-op Pain Management:    Induction: Intravenous, Rapid sequence and Cricoid pressure planned  Airway Management Planned: Oral ETT  Additional Equipment:   Intra-op Plan:   Post-operative Plan: Extubation in OR  Informed Consent: I have reviewed the patients History and Physical, chart, labs and discussed the procedure including the risks, benefits and alternatives for the proposed anesthesia with the patient or authorized representative who has indicated his/her understanding and acceptance.   Dental advisory given  Plan Discussed with: CRNA  Anesthesia Plan Comments:         Anesthesia Quick Evaluation

## 2012-01-30 NOTE — Addendum Note (Signed)
Addendum  created 01/30/12 1419 by Corena Pilgrim, CRNA   Modules edited:Anesthesia Events, Anesthesia Medication Administration

## 2012-01-31 LAB — CBC
Platelets: 272 10*3/uL (ref 150–400)
RBC: 2.92 MIL/uL — ABNORMAL LOW (ref 3.87–5.11)
RDW: 12.5 % (ref 11.5–15.5)
WBC: 11.2 10*3/uL — ABNORMAL HIGH (ref 4.0–10.5)

## 2012-01-31 LAB — BASIC METABOLIC PANEL
Chloride: 102 mEq/L (ref 96–112)
GFR calc Af Amer: >90 mL/min (ref >90)
GFR calc non Af Amer: >90 mL/min (ref >90)
Potassium: 3.5 mEq/L (ref 3.5–5.1)
Sodium: 133 mEq/L — ABNORMAL LOW (ref 135–145)

## 2012-01-31 LAB — MAGNESIUM: Magnesium: 1.7 mg/dL (ref 1.5–2.5)

## 2012-01-31 LAB — PHOSPHORUS: Phosphorus: 3.1 mg/dL (ref 2.3–4.6)

## 2012-01-31 MED ORDER — SODIUM CHLORIDE 0.9 % IJ SOLN
INTRAMUSCULAR | Status: AC
  Filled 2012-01-31: qty 3

## 2012-01-31 MED ORDER — HYDROMORPHONE 0.3 MG/ML IV SOLN
INTRAVENOUS | Status: AC
  Filled 2012-01-31: qty 25

## 2012-01-31 MED ORDER — ACETAMINOPHEN 10 MG/ML IV SOLN
INTRAVENOUS | Status: AC
  Filled 2012-01-31: qty 100

## 2012-01-31 MED ORDER — HYDROMORPHONE 0.3 MG/ML IV SOLN
INTRAVENOUS | Status: AC
  Administered 2012-01-31: 14:00:00
  Filled 2012-01-31: qty 25

## 2012-01-31 MED ORDER — ALBUTEROL SULFATE HFA 108 (90 BASE) MCG/ACT IN AERS: 2.0000 | INHALATION_SPRAY | Freq: Four times a day (QID) | RESPIRATORY_TRACT | Status: DC | PRN

## 2012-01-31 MED ORDER — HYDROMORPHONE 0.3 MG/ML IV SOLN
INTRAVENOUS | Status: DC
  Administered 2012-01-31: 2.03 mg via INTRAVENOUS
  Administered 2012-01-31: 4.8 mg via INTRAVENOUS
  Administered 2012-01-31: 08:00:00 via INTRAVENOUS
  Administered 2012-01-31: 2.7 mg via INTRAVENOUS
  Administered 2012-01-31: 14:00:00 via INTRAVENOUS
  Administered 2012-01-31: 4.24 mg via INTRAVENOUS
  Administered 2012-02-01: 4.5 mg via INTRAVENOUS
  Administered 2012-02-01: 11:00:00 via INTRAVENOUS
  Administered 2012-02-01: 2.75 mg via INTRAVENOUS
  Administered 2012-02-02: 2.4 mg via INTRAVENOUS
  Administered 2012-02-02: 25 mL via INTRAVENOUS
  Administered 2012-02-02 (×2): via INTRAVENOUS
  Administered 2012-02-03: 0.9 mg via INTRAVENOUS
  Administered 2012-02-03: 0.9 mL via INTRAVENOUS

## 2012-01-31 MED ORDER — ACETAMINOPHEN 10 MG/ML IV SOLN
1000.0000 mg | Freq: Four times a day (QID) | INTRAVENOUS | Status: AC
  Administered 2012-01-31 – 2012-02-01 (×3): 1000 mg via INTRAVENOUS
  Filled 2012-01-31 (×2): qty 100

## 2012-01-31 MED ORDER — SODIUM CHLORIDE 0.9 % IJ SOLN
INTRAMUSCULAR | Status: AC
  Administered 2012-01-31: 10 mL
  Filled 2012-01-31: qty 3

## 2012-01-31 MED ORDER — KCL IN DEXTROSE-NACL 40-5-0.45 MEQ/L-%-% IV SOLN
INTRAVENOUS | Status: DC
  Administered 2012-01-31 – 2012-02-02 (×7): via INTRAVENOUS
  Administered 2012-02-03: 20 mL/h via INTRAVENOUS

## 2012-01-31 NOTE — Addendum Note (Signed)
Addendum  created 01/31/12 1251 by Glynn Octave, CRNA   Modules edited:Notes Section

## 2012-01-31 NOTE — Progress Notes (Signed)
01/31/12 1340 Patient c/o some wheezing earlier, expiratory wheezing noted occasionally. Stated uses ventolin inhaler at home for wheezing/shortness of breath. Notified Dr Lovell Sheehan, stated okay to continue inhaler use as at home per home med list, notified respiratory therapy. Encouraged patient to use incentive spirometer every hour while awake and educated pt in correct use, demonstrated correct technique, volume 1250. Also encouraged cough/deep breathing and splint abdominal area with pillow for support. Verbalizes and demonstrates understanding. Nursing to monitor.

## 2012-01-31 NOTE — Progress Notes (Signed)
01/31/12 1544 Patient c/o pain to her sacral/coccyx area. Stated "its been hurting for a while, about 2 weeks", no skin breakdown or bruises noted. States has had some pain there since fall at home about 1.5 years ago. Bedalarm on for safety ,encouraged to call for assistance. Nursing to monitor.

## 2012-01-31 NOTE — Progress Notes (Signed)
Placed on oxygen 2 L [place]. Desat noted while sleeping. Sats 87-88% noted. Explained use of oxygen while on PCA. Encouraged use of I.S. Verbalized understanding. Nusing to monitor oxygen level.

## 2012-01-31 NOTE — Progress Notes (Signed)
I saw her for  a "social visit". She is complaining of pain but otherwise says she's doing well.

## 2012-01-31 NOTE — Progress Notes (Signed)
1 Day Post-Op  Subjective: Having significant incisional pain. Have tried many different pain control methods, currently on Dilaudid PCA.  Objective: Vital signs in last 24 hours: Temp:  [97.6 F (36.4 C)-98.4 F (36.9 C)] 97.8 F (36.6 C) (02/02 0559) Pulse Rate:  [66-85] 80  (02/02 0559) Resp:  [12-43] 16  (02/02 0559) BP: (96-127)/(61-79) 109/69 mmHg (02/02 0559) SpO2:  [92 %-100 %] 97 % (02/02 0802) Last BM Date: 01/29/12  Intake/Output from previous day: 02/01 0701 - 02/02 0700 In: 3485.3 [I.V.:3183.3; IV Piggyback:302] Out: 675 [Urine:500; Blood:175] Intake/Output this shift:    General appearance: alert and Anxious Resp: clear to auscultation bilaterally Cardio: regular rate and rhythm, S1, S2 normal, no murmur, click, rub or gallop GI: Soft. Dressings dry and intact.  Lab Results:   Basename 01/31/12 0640 01/29/12 1200  WBC 11.2* 12.9*  HGB 9.2* 13.1  HCT 26.2* 36.8  PLT 272 299   BMET  Basename 01/31/12 0640  NA 133*  K 3.5  CL 102  CO2 26  GLUCOSE 114*  BUN 7  CREATININE 0.72  CALCIUM 7.8*   PT/INR No results found for this basename: LABPROT:2,INR:2 in the last 72 hours  Studies/Results: No results found.  Anti-infectives: Anti-infectives     Start     Dose/Rate Route Frequency Ordered Stop   01/30/12 1115   ertapenem (INVANZ) 1 g in sodium chloride 0.9 % 50 mL IVPB  Status:  Discontinued        1 g 100 mL/hr over 30 Minutes Intravenous 60 min pre-op 01/30/12 1108 01/30/12 1503   01/30/12 1114   sodium chloride 0.9 % with ertapenem [facility]) ADS Med     Comments: Gaye Alken: cabinet override         01/30/12 1114 01/30/12 2314          Assessment/Plan: s/p Procedure(s): HAND ASSISTED LAPAROSCOPIC COLON RESECTION PARTIAL COLECTOMY Impression: Patient is stable. She is anemic but this is secondary to surgery. She is extremely anxious about her pain control. I tried to reassure her that I am trying to control her pain as best as  possible. Have encouraged the patient to ambulate as able. Will adjust her IV fluids. We'll monitor her hemoglobin. Will reorder her IV acetaminophen.  LOS: 1 day    Branko Steeves A 01/31/2012

## 2012-02-01 LAB — CBC
Platelets: 239 10*3/uL (ref 150–400)
RBC: 2.35 MIL/uL — ABNORMAL LOW (ref 3.87–5.11)
WBC: 9.4 10*3/uL (ref 4.0–10.5)

## 2012-02-01 LAB — GLUCOSE, CAPILLARY: Glucose-Capillary: 113 mg/dL — ABNORMAL HIGH (ref 70–99)

## 2012-02-01 LAB — BASIC METABOLIC PANEL
BUN: 4 mg/dL — ABNORMAL LOW (ref 6–23)
CO2: 24 mEq/L (ref 19–32)
Chloride: 103 mEq/L (ref 96–112)
GFR calc Af Amer: >90 mL/min (ref >90)
Potassium: 4.2 mEq/L (ref 3.5–5.1)

## 2012-02-01 LAB — PHOSPHORUS: Phosphorus: 1.9 mg/dL — ABNORMAL LOW (ref 2.3–4.6)

## 2012-02-01 MED ORDER — MOXIFLOXACIN HCL 400 MG PO TABS
400.0000 mg | ORAL_TABLET | Freq: Every day | ORAL | Status: DC
  Administered 2012-02-01 – 2012-02-03 (×3): 400 mg via ORAL
  Filled 2012-02-01 (×3): qty 1

## 2012-02-01 MED ORDER — HYDROMORPHONE 0.3 MG/ML IV SOLN
INTRAVENOUS | Status: AC
  Filled 2012-02-01: qty 25

## 2012-02-01 MED ORDER — DEXTROSE 5 % IV SOLN
20.0000 mmol | Freq: Once | INTRAVENOUS | Status: AC
  Administered 2012-02-01: 20 mmol via INTRAVENOUS
  Filled 2012-02-01: qty 6.67

## 2012-02-01 NOTE — Consult Note (Signed)
MEDICATION RELATED CONSULT NOTE - INITIAL   Pharmacy Consult for Phosphorous replacement Indication: hypophosphatemia  Allergies  Allergen Reactions  . Ativan (Lorazepam) Other (See Comments)    Hallucinations  . Clarithromycin Itching and Nausea And Vomiting  . Nubain (Nalbuphine Hcl) Other (See Comments)    Deathly sick  . Pseudoephedrine Hives  . Sulfa Antibiotics Nausea And Vomiting  . Factive (Gemifloxacin Mesylate) Rash  . Prochlorperazine Edisylate Anxiety  . Promethazine Hcl Anxiety  . Tofranil-Pm Rash  . Trimethobenzamide Hcl Anxiety    Patient Measurements:     Vital Signs: Temp: 98 F (36.7 C) (02/03 0503) BP: 133/76 mmHg (02/03 0503) Pulse Rate: 99  (02/03 0503) Intake/Output from previous day: 02/02 0701 - 02/03 0700 In: 2993.3 [P.O.:960; I.V.:2033.3] Out: 2500 [Urine:2500] Intake/Output from this shift: Total I/O In: -  Out: 1050 [Urine:1050]  Labs: BMET    Component Value Date/Time   NA 132* 02/01/2012 0645   NA 139 11/12/2011 0928   K 4.2 02/01/2012 0645   K 4.6 11/12/2011 0928   CL 103 02/01/2012 0645   CL 105 11/12/2011 0928   CO2 24 02/01/2012 0645   CO2 26 11/12/2011 0928   GLUCOSE 111* 02/01/2012 0645   BUN 4* 02/01/2012 0645   BUN 10 11/12/2011 0928   CREATININE 0.63 02/01/2012 0645   CREATININE 0.81 11/12/2011 0928   CALCIUM 7.7* 02/01/2012 0645   CALCIUM 9.4 11/12/2011 0928   GFRNONAA >90 02/01/2012 0645   GFRAA >90 02/01/2012 0645     Basename 02/01/12 0719 02/01/12 0645 01/31/12 0640  WBC 9.4 -- 11.2*  HGB 7.4* -- 9.2*  HCT 21.0* -- 26.2*  PLT 239 -- 272  APTT -- -- --  CREATININE -- 0.63 0.72  LABCREA -- -- --  CREATININE -- 0.63 0.72  CREAT24HRUR -- -- --  MG -- 1.7 1.7  PHOS -- 1.9* 3.1  ALBUMIN -- -- --  PROT -- -- --  ALBUMIN -- -- --  AST -- -- --  ALT -- -- --  ALKPHOS -- -- --  BILITOT -- -- --  BILIDIR -- -- --  IBILI -- -- --   The CrCl is unknown because both a height and weight (above a minimum accepted value) are  required for this calculation.   Microbiology: Recent Results (from the past 720 hour(s))  SURGICAL PCR SCREEN     Status: Normal   Collection Time   01/05/12  7:15 AM      Component Value Range Status Comment   MRSA, PCR NEGATIVE  NEGATIVE  Final    Staphylococcus aureus NEGATIVE  NEGATIVE  Final   URINE CULTURE     Status: Normal   Collection Time   01/16/12  5:55 PM      Component Value Range Status Comment   Specimen Description URINE, CLEAN CATCH   Final    Special Requests NONE   Final    Culture  Setup Time [id number]   Final    Colony Count NO GROWTH   Final    Culture NO GROWTH   Final    Report Status 01/18/2012 FINAL   Final    Medical History: Past Medical History  Diagnosis Date  . Arthritis   . COPD (chronic obstructive pulmonary disease)   . Bronchitis   . Fibromyalgia   . Interstitial cystitis   . Migraine   . Depression with anxiety   . UTI (lower urinary tract infection)   . RLS (restless legs syndrome)   .  Diverticulitis of colon (without mention of hemorrhage)   . Alopecia, unspecified   . Cervicalgia   . Tobacco use disorder   . PTSD (post-traumatic stress disorder)   . IBS (irritable bowel syndrome)   . GERD (gastroesophageal reflux disease)   . Dysphagia   . PONV (postoperative nausea and vomiting)   . Shortness of breath   . Kidney stones   . Anxiety   . Carcinoid tumor of appendix    Assessment: hypophosphatemia  Goal of Therapy:  Replenish Phos to WNL  Plan:  Sodium Phosphates today iv over 6 hours F/U phos level tomorrow  Margo Aye, Colen Eltzroth A 02/01/2012,1:10 PM

## 2012-02-01 NOTE — Progress Notes (Signed)
2 Days Post-Op  Subjective: . Pain under better control. She's not had a bowel movement or flatus yet. She is ambulating in the room.  Objective: Vital signs in last 24 hours: Temp:  [96.2 F (35.7 C)-98 F (36.7 C)] 98 F (36.7 C) (02/03 0503) Pulse Rate:  [82-99] 99  (02/03 0503) Resp:  [16-100] 18  (02/03 1200) BP: (100-133)/(67-76) 133/76 mmHg (02/03 0503) SpO2:  [2 %-100 %] 96 % (02/03 1200) Last BM Date: 01/29/12  Intake/Output from previous day: 02/02 0701 - 02/03 0700 In: 2993.3 [P.O.:960; I.V.:2033.3] Out: 2500 [Urine:2500] Intake/Output this shift: Total I/O In: -  Out: 1050 [Urine:1050]  General appearance: alert, cooperative and no distress Resp: clear to auscultation bilaterally Cardio: regular rate and rhythm, S1, S2 normal, no murmur, click, rub or gallop GI: Soft, nondistended. Incisions healing well. Minimal bowel sounds heard.  Lab Results:   Basename 02/01/12 0719 01/31/12 0640  WBC 9.4 11.2*  HGB 7.4* 9.2*  HCT 21.0* 26.2*  PLT 239 272   BMET  Basename 02/01/12 0645 01/31/12 0640  NA 132* 133*  K 4.2 3.5  CL 103 102  CO2 24 26  GLUCOSE 111* 114*  BUN 4* 7  CREATININE 0.63 0.72  CALCIUM 7.7* 7.8*   PT/INR No results found for this basename: LABPROT:2,INR:2 in the last 72 hours  Studies/Results: No results found.  Anti-infectives: Anti-infectives     Start     Dose/Rate Route Frequency Ordered Stop   02/01/12 1800   moxifloxacin (AVELOX) tablet 400 mg        400 mg Oral Daily-1800 02/01/12 0745     01/30/12 1115   ertapenem (INVANZ) 1 g in sodium chloride 0.9 % 50 mL IVPB  Status:  Discontinued        1 g 100 mL/hr over 30 Minutes Intravenous 60 min pre-op 01/30/12 1108 01/30/12 1503   01/30/12 1114   sodium chloride 0.9 % with ertapenem [facility]) ADS Med     Comments: Gaye Alken: cabinet override         01/30/12 1114 01/30/12 2314          Assessment/Plan: s/p Procedure(s): HAND ASSISTED LAPAROSCOPIC COLON  RESECTION PARTIAL COLECTOMY Impression: Patient subjectively better today. Still awaiting return of bowel function. She is hypophosphatemic and this will be addressed. She does have anemia, but she does not require a blood transfusion at this time. Her vital signs are stable. We'll continue to monitor hemoglobin.  LOS: 2 days    [name] A 02/01/2012

## 2012-02-01 NOTE — Progress Notes (Signed)
Subjective: She has a complaint of pain in her sacral area. She has no other new complaints. She is still having abdominal pain. She has coughed up some dark sputum  Objective: Vital signs in last 24 hours: Temp:  [96.2 F (35.7 C)-98 F (36.7 C)] 98 F (36.7 C) (02/03 0503) Pulse Rate:  [82-99] 99  (02/03 0503) Resp:  [16-100] 18  (02/03 0756) BP: (100-133)/(67-76) 133/76 mmHg (02/03 0503) SpO2:  [2 %-100 %] 100 % (02/03 0756) Weight change:  Last BM Date: 01/29/12  Intake/Output from previous day: 02/02 0701 - 02/03 0700 In: 2993.3 [P.O.:960; I.V.:2033.3] Out: 2500 [Urine:2500]  PHYSICAL EXAM General appearance: alert, cooperative and mild distress Resp: rhonchi bilaterally Cardio: regular rate and rhythm, S1, S2 normal, no murmur, click, rub or gallop GI: Not examined Extremities: extremities normal, atraumatic, no cyanosis or edema  Lab Results:    Basic Metabolic Panel:  Basename 02/01/12 0645 01/31/12 0640  NA 132* 133*  K 4.2 3.5  CL 103 102  CO2 24 26  GLUCOSE 111* 114*  BUN 4* 7  CREATININE 0.63 0.72  CALCIUM 7.7* 7.8*  MG 1.7 1.7  PHOS 1.9* 3.1   Liver Function Tests: No results found for this basename: AST:2,ALT:2,ALKPHOS:2,BILITOT:2,PROT:2,ALBUMIN:2 in the last 72 hours No results found for this basename: LIPASE:2,AMYLASE:2 in the last 72 hours No results found for this basename: AMMONIA:2 in the last 72 hours CBC:  Basename 02/01/12 0719 01/31/12 0640 01/29/12 1200  WBC 9.4 11.2* --  NEUTROABS -- -- 9.2*  HGB 7.4* 9.2* --  HCT 21.0* 26.2* --  MCV 89.4 89.7 --  PLT 239 272 --   Cardiac Enzymes: No results found for this basename: CKTOTAL:3,CKMB:3,CKMBINDEX:3,TROPONINI:3 in the last 72 hours BNP: No results found for this basename: PROBNP:3 in the last 72 hours D-Dimer: No results found for this basename: DDIMER:2 in the last 72 hours CBG: No results found for this basename: GLUCAP:6 in the last 72 hours Hemoglobin A1C: No results found  for this basename: HGBA1C in the last 72 hours Fasting Lipid Panel: No results found for this basename: CHOL,HDL,LDLCALC,TRIG,CHOLHDL,LDLDIRECT in the last 72 hours Thyroid Function Tests: No results found for this basename: TSH,T4TOTAL,FREET4,T3FREE,THYROIDAB in the last 72 hours Anemia Panel: No results found for this basename: VITAMINB12,FOLATE,FERRITIN,TIBC,IRON,RETICCTPCT in the last 72 hours Coagulation: No results found for this basename: LABPROT:2,INR:2 in the last 72 hours Urine Drug Screen: Drugs of Abuse     Component Value Date/Time   LABOPIA POSITIVE* 04/23/2009 1637   COCAINSCRNUR NONE DETECTED 04/23/2009 1637   LABBENZ POSITIVE* 04/23/2009 1637   AMPHETMU NONE DETECTED 04/23/2009 1637   THCU NONE DETECTED 04/23/2009 1637   LABBARB  Value: NONE DETECTED        DRUG SCREEN FOR MEDICAL PURPOSES ONLY.  IF CONFIRMATION IS NEEDED FOR ANY PURPOSE, NOTIFY LAB WITHIN 5 DAYS.        LOWEST DETECTABLE LIMITS FOR URINE DRUG SCREEN Drug Class       Cutoff (ng/mL) Amphetamine      1000 Barbiturate      200 Benzodiazepine   200 Tricyclics       300 Opiates          300 Cocaine          300 THC              50 04/23/2009 1637    Alcohol Level: No results found for this basename: ETH:2 in the last 72 hours Urinalysis: No results found for this basename: COLORURINE:2,APPERANCEUR:2,LABSPEC:2,PHURINE:2,GLUCOSEU:2,HGBUR:2,BILIRUBINUR:2,KETONESUR:2,PROTEINUR:2,UROBILINOGEN:2,NITRITE:2,LEUKOCYTESUR:2  in the last 72 hours Misc. Labs:  ABGS No results found for this basename: PHART,PCO2,PO2ART,TCO2,HCO3 in the last 72 hours CULTURES No results found for this or any previous visit (from the past 240 hour(s)). Studies/Results: No results found.  Medications:  Scheduled:   . acetaminophen  1,000 mg Intravenous Q6H  . acetaminophen  1,000 mg Intravenous Q6H  . alvimopan  12 mg Oral BID  . enoxaparin  40 mg Subcutaneous Q24H  . estradiol  0.1 mg Transdermal 2 times weekly  . FLUoxetine  40 mg  Oral Daily  . HYDROmorphone PCA 0.3 mg/mL   Intravenous Q4H  . HYDROmorphone PCA 0.3 mg/mL      . HYDROmorphone PCA 0.3 mg/mL      . HYDROmorphone PCA 0.3 mg/mL      . moxifloxacin  400 mg Oral q1800  . sodium chloride       Continuous:   . dextrose 5 % and 0.45 % NaCl with KCl 40 mEq/L 100 mL/hr at 02/01/12 0548  . DISCONTD: lactated ringers 100 mL/hr at 01/31/12 0605   ZOX:WRUEAVWUJ, ALPRAZolam, cycloSPORINE, diphenhydrAMINE, diphenhydrAMINE, HYDROmorphone, naloxone, ondansetron (ZOFRAN) IV, ondansetron, sodium chloride, DISCONTD: ondansetron (ZOFRAN) IV  Assesment: She complains of sacral pain which apparently happened after a fall. It is been going on for some time and I don't think he really needs an inpatient workup but will need some further evaluation eventually once she's over her surgery. She has what may be some bronchitis with her COPD so I am going to add an hour by Active Problems:  * No active hospital problems. *     Plan: I added Avelox.    LOS: 2 days   Zniyah Midkiff L 02/01/2012, 8:11 AM

## 2012-02-02 LAB — BASIC METABOLIC PANEL
Calcium: 7.6 mg/dL — ABNORMAL LOW (ref 8.4–10.5)
GFR calc Af Amer: >90 mL/min (ref >90)
GFR calc non Af Amer: >90 mL/min (ref >90)
Glucose, Bld: 94 mg/dL (ref 70–99)
Potassium: 3.4 mEq/L — ABNORMAL LOW (ref 3.5–5.1)
Sodium: 136 mEq/L (ref 135–145)

## 2012-02-02 LAB — CBC
Hemoglobin: 6.8 g/dL — CL (ref 12.0–15.0)
MCH: 31.2 pg (ref 26.0–34.0)
MCHC: 35.1 g/dL (ref 30.0–36.0)
Platelets: 257 10*3/uL (ref 150–400)

## 2012-02-02 LAB — MAGNESIUM: Magnesium: 1.6 mg/dL (ref 1.5–2.5)

## 2012-02-02 LAB — PHOSPHORUS: Phosphorus: 2.2 mg/dL — ABNORMAL LOW (ref 2.3–4.6)

## 2012-02-02 MED ORDER — SODIUM CHLORIDE 0.9 % IJ SOLN
INTRAMUSCULAR | Status: AC
  Filled 2012-02-02: qty 3

## 2012-02-02 MED ORDER — HYDROMORPHONE 0.3 MG/ML IV SOLN
INTRAVENOUS | Status: AC
  Administered 2012-02-02: 0.3 mg
  Filled 2012-02-02: qty 25

## 2012-02-02 MED ORDER — HYDROMORPHONE 0.3 MG/ML IV SOLN
INTRAVENOUS | Status: AC
  Filled 2012-02-02: qty 25

## 2012-02-02 MED ORDER — SODIUM PHOSPHATE 3 MMOLE/ML IV SOLN
20.0000 mmol | Freq: Once | INTRAVENOUS | Status: AC
  Administered 2012-02-02: 20 mmol via INTRAVENOUS
  Filled 2012-02-02: qty 6.67

## 2012-02-02 NOTE — Clinical Documentation Improvement (Signed)
GENERIC DOCUMENTATION CLARIFICATION QUERY  THIS DOCUMENT IS NOT A PERMANENT PART OF THE MEDICAL RECORD  TO RESPOND TO THE THIS QUERY, FOLLOW THE INSTRUCTIONS BELOW:  1. If needed, update documentation for the patient's encounter via the notes activity.  2. Access this query again and click edit on the In [employer].  3. After updating, or not, click F2 to complete all highlighted (required) fields concerning your review. Select "additional documentation in the medical record" OR "no additional documentation provided".  4. Click Sign note button.  5. The deficiency will fall out of your In Basket *Please let us know if you are not able to complete this workflow by phone or e-mail (listed below).  Please update your documentation within the medical record to reflect your response to this query.                                                                                        02/02/12   Dear Dr. Juanetta Gosling / Associates,  In a better effort to capture your patient's severity of illness, reflect appropriate length of stay and utilization of resources, a review of the patient medical record has revealed the following indicators.    Based on your clinical judgment, please clarify and document in a progress note and/or discharge summary the clinical condition associated with the following supporting information:  In responding to this query please exercise your independent judgment.  The fact that a query is asked, does not imply that any particular answer is desired or expected.  Potential Diagnosis:   -Acute bronchitis with COPD -Chronic bronchitis with COPD -Acute on chronic bronchitis with COPD -Other Condition (please specify) -Cannot Clinically Determine   Clinical Information:   Risk Factors: H/O COPD " She has what may be some bronchitis with her COPD " per 2/3 progress note  Signs & Symptoms: Rhonchi  Treatment:  Avelox 400mg  daily Albuterol inhaler 2 puffs q6h  prn Incentive Spirometry Continuous pulse ox-document q4h Turn cough deep breath q2h  You may use possible, probable, or suspect with inpatient documentation. possible, probable, suspected diagnoses MUST be documented at the time of discharge  Reviewed: additional documentation in the medical record  Thank You,  Debora T Williams RN, MSN Clinical Documentation Specialist: Office# [phone] [facility] Health Information Management [place]

## 2012-02-02 NOTE — Procedures (Signed)
  Small Bowel Givens Capsule Study Procedure date:  01/27/12  Referring Provider:  Dr. Jena Gauss PCP:  Dr. Fredirick Maudlin, MD, MD  Indication for procedure:   Mrs. Cox presents for a small bowel capsule study prior to hemicolectomy. 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 This study is being performed to assess for any evidence of small bowel carcinoid tumors or other issues that would be informative prior to proceeding with the hemicolectomy.    Findings:   First Gastric image:  00:43 First Duodenal image: 57:44 First Ileo-Cecal Valve image: 3:58:58 First Cecal image: 3:59 Gastric Passage time:  88m Small Bowel Passage time:  3h 80m  Summary & Recommendations: No evidence for small bowel carcinoid noted. No mass, lesions, or other abnormalities present. Area of interest at 3:04:36 through 3:04:37 noted by reader and reviewed with physician. This was consistent with normal small bowel landscape. Second area of question due to presentation of possible different pigmentation, questionable raised area was also reviewed at 3:21:10. Again, this was not of clinical significance and noted as normal variant of small bowel. Continue with hemicolectomy as planned.      CC: Dr. Mariel Sleet         Dr. Lovell Sheehan         Dr. Sudie Bailey, ANP-BC

## 2012-02-02 NOTE — Progress Notes (Signed)
3 Days Post-Op  Subjective: Moderate anxiety. Patient did have multiple bloody bowel movements.  Objective: Vital signs in last 24 hours: Temp:  [97.6 F (36.4 C)-98.7 F (37.1 C)] 98.6 F (37 C) (02/04 0235) Pulse Rate:  [91-102] 95  (02/04 0415) Resp:  [16-20] 18  (02/04 0415) BP: (106-120)/(65-79) 118/69 mmHg (02/04 0415) SpO2:  [96 %-100 %] 100 % (02/04 0415) Last BM Date: 01/29/12  Intake/Output from previous day: 02/03 0701 - 02/04 0700 In: 120 [P.O.:120] Out: 1850 [Urine:1850] Intake/Output this shift:    General appearance: alert, cooperative and no distress Resp: clear to auscultation bilaterally Cardio: regular rate and rhythm, S1, S2 normal, no murmur, click, rub or gallop GI: Soft. Good bowel sounds. Incisions healing well.  Lab Results:   Basename 02/02/12 0515 02/01/12 0719  WBC 7.9 9.4  HGB 6.8* 7.4*  HCT 19.4* 21.0*  PLT 257 239   BMET  Basename 02/02/12 0515 02/01/12 0645  NA 136 132*  K 3.4* 4.2  CL 102 103  CO2 28 24  GLUCOSE 94 111*  BUN 4* 4*  CREATININE 0.65 0.63  CALCIUM 7.6* 7.7*   PT/INR No results found for this basename: LABPROT:2,INR:2 in the last 72 hours  Studies/Results: No results found.  Anti-infectives: Anti-infectives     Start     Dose/Rate Route Frequency Ordered Stop   02/01/12 1800   moxifloxacin (AVELOX) tablet 400 mg        400 mg Oral Daily-1800 02/01/12 0745     01/30/12 1115   ertapenem (INVANZ) 1 g in sodium chloride 0.9 % 50 mL IVPB  Status:  Discontinued        1 g 100 mL/hr over 30 Minutes Intravenous 60 min pre-op 01/30/12 1108 01/30/12 1503   01/30/12 1114   sodium chloride 0.9 % with ertapenem [facility]     Comments: Gaye Alken: cabinet override         01/30/12 1114 01/30/12 2314          Assessment/Plan: s/p Procedure(s): HAND ASSISTED LAPAROSCOPIC COLON RESECTION PARTIAL COLECTOMY Impression: Patient has had a slight drop in her hemoglobin since yesterday. She will receive  2 units of packed red blood cells. This is from her surgery. She started having bloody bowel movements. Will advance diet.  LOS: 3 days    Zandyr Barnhill A 02/02/2012

## 2012-02-02 NOTE — Clinical Documentation Improvement (Signed)
Anemia Blood Loss Clarification  THIS DOCUMENT IS NOT A PERMANENT PART OF THE MEDICAL RECORD  RESPOND TO THE THIS QUERY, FOLLOW THE INSTRUCTIONS BELOW:  1. If needed, update documentation for the patient's encounter via the notes activity.  2. Access this query again and click edit on the In [employer].  3. After updating, or not, click F2 to complete all highlighted (required) fields concerning your review. Select "additional documentation in the medical record" OR "no additional documentation provided".  4. Click Sign note button.  5. The deficiency will fall out of your In Basket *Please let us know if you are not able to complete this workflow by phone or e-mail (listed below).        02/02/12  Dear Dr. Lovell Sheehan Marton [place]  In an effort to better capture your patient's severity of illness, reflect appropriate length of stay and utilization of resources, a review of the patient medical record has revealed the following indicators.    Based on your clinical judgment, please clarify and document in a progress note and/or discharge summary the clinical condition associated with the following supporting information:  In responding to this query please exercise your independent judgment.  The fact that a query is asked, does not imply that any particular answer is desired or expected.  Possible Clinical Conditions?   " Expected Acute Blood Loss Anemia " Acute Blood Loss Anemia " Acute on chronic blood loss anemia " Chronic blood loss anemia " Precipitous drop in Hematocrit " Other Condition________________ " Cannot Clinically Determine  Supporting Information:  Clinical Information:  Risk Factors:  s/p R hemicolectomy on 2/1 EBL=1104ml/h "anemic but this is secondary to surgery"  Signs and Symptoms: Tachycardia bloody bowel movements  Diagnostics: 1/31 H&H on admit: 13.1/36.8 2/2 H&H=9.2/26.2 2/3 H&H=7.4/21.0 2/4 H&H=6.8/19.4  Treatments: Transfusion: 2/4= 2 units  PRBCs IV fluids: Increased 2/2 Frequent H&H monitoring    Reviewed: additional documentation in the medical record  Thank You,  Debora T Williams RN, MSN Clinical Documentation Specialist: Office# [phone] [facility] Health Information Management [place]

## 2012-02-02 NOTE — Progress Notes (Signed)
UR Chart Review Completed  

## 2012-02-02 NOTE — Progress Notes (Signed)
Subjective: She is about the same. She says she feels like her heart rate is too fast and she is anemic. Her pain seems to be little bit better controlled.  Objective: Vital signs in last 24 hours: Temp:  [97.6 F (36.4 C)-98.7 F (37.1 C)] 98.6 F (37 C) (02/04 0235) Pulse Rate:  [91-102] 95  (02/04 0415) Resp:  [16-20] 18  (02/04 0415) BP: (106-120)/(65-79) 118/69 mmHg (02/04 0415) SpO2:  [96 %-100 %] 100 % (02/04 0415) Weight change:  Last BM Date: 01/29/12  Intake/Output from previous day: 02/03 0701 - 02/04 0700 In: 120 [P.O.:120] Out: 1850 [Urine:1850]  PHYSICAL EXAM General appearance: alert and mild distress Resp: clear to auscultation bilaterally Cardio: regular rate and rhythm, S1, S2 normal, no murmur, click, rub or gallop GI: soft, non-tender; bowel sounds normal; no masses,  no organomegaly Extremities: extremities normal, atraumatic, no cyanosis or edema  Lab Results:    Basic Metabolic Panel:  Basename 02/02/12 0515 02/01/12 0645  NA 136 132*  K 3.4* 4.2  CL 102 103  CO2 28 24  GLUCOSE 94 111*  BUN 4* 4*  CREATININE 0.65 0.63  CALCIUM 7.6* 7.7*  MG 1.6 1.7  PHOS 2.2* 1.9*   Liver Function Tests: No results found for this basename: AST:2,ALT:2,ALKPHOS:2,BILITOT:2,PROT:2,ALBUMIN:2 in the last 72 hours No results found for this basename: LIPASE:2,AMYLASE:2 in the last 72 hours No results found for this basename: AMMONIA:2 in the last 72 hours CBC:  Basename 02/02/12 0515 02/01/12 0719  WBC 7.9 9.4  NEUTROABS -- --  HGB 6.8* 7.4*  HCT 19.4* 21.0*  MCV 89.0 89.4  PLT 257 239   Cardiac Enzymes: No results found for this basename: CKTOTAL:3,CKMB:3,CKMBINDEX:3,TROPONINI:3 in the last 72 hours BNP: No results found for this basename: PROBNP:3 in the last 72 hours D-Dimer: No results found for this basename: DDIMER:2 in the last 72 hours CBG:  Basename 02/01/12 2005  GLUCAP 113*   Hemoglobin A1C: No results found for this basename: HGBA1C  in the last 72 hours Fasting Lipid Panel: No results found for this basename: CHOL,HDL,LDLCALC,TRIG,CHOLHDL,LDLDIRECT in the last 72 hours Thyroid Function Tests: No results found for this basename: TSH,T4TOTAL,FREET4,T3FREE,THYROIDAB in the last 72 hours Anemia Panel: No results found for this basename: VITAMINB12,FOLATE,FERRITIN,TIBC,IRON,RETICCTPCT in the last 72 hours Coagulation: No results found for this basename: LABPROT:2,INR:2 in the last 72 hours Urine Drug Screen: Drugs of Abuse     Component Value Date/Time   LABOPIA POSITIVE* 04/23/2009 1637   COCAINSCRNUR NONE DETECTED 04/23/2009 1637   LABBENZ POSITIVE* 04/23/2009 1637   AMPHETMU NONE DETECTED 04/23/2009 1637   THCU NONE DETECTED 04/23/2009 1637   LABBARB  Value: NONE DETECTED        DRUG SCREEN FOR MEDICAL PURPOSES ONLY.  IF CONFIRMATION IS NEEDED FOR ANY PURPOSE, NOTIFY LAB WITHIN 5 DAYS.        LOWEST DETECTABLE LIMITS FOR URINE DRUG SCREEN Drug Class       Cutoff (ng/mL) Amphetamine      1000 Barbiturate      200 Benzodiazepine   200 Tricyclics       300 Opiates          300 Cocaine          300 THC              50 04/23/2009 1637    Alcohol Level: No results found for this basename: ETH:2 in the last 72 hours Urinalysis: No results found for this basename: COLORURINE:2,APPERANCEUR:2,LABSPEC:2,PHURINE:2,GLUCOSEU:2,HGBUR:2,BILIRUBINUR:2,KETONESUR:2,PROTEINUR:2,UROBILINOGEN:2,NITRITE:2,LEUKOCYTESUR:2 in the last  72 hours Misc. Labs:  ABGS No results found for this basename: PHART,PCO2,PO2ART,TCO2,HCO3 in the last 72 hours CULTURES No results found for this or any previous visit (from the past 240 hour(s)). Studies/Results: No results found.  Medications:  Scheduled:   . acetaminophen  1,000 mg Intravenous Q6H  . alvimopan  12 mg Oral BID  . enoxaparin  40 mg Subcutaneous Q24H  . estradiol  0.1 mg Transdermal 2 times weekly  . FLUoxetine  40 mg Oral Daily  . HYDROmorphone PCA 0.3 mg/mL   Intravenous Q4H  .  HYDROmorphone PCA 0.3 mg/mL      . HYDROmorphone PCA 0.3 mg/mL      . moxifloxacin  400 mg Oral q1800  . sodium phosphate  Dextrose 5% IVPB  20 mmol Intravenous Once   Continuous:   . dextrose 5 % and 0.45 % NaCl with KCl 40 mEq/Walker 50 mL/hr at 02/01/12 2115   ZOX:WRUEAVWUJ, ALPRAZolam, cycloSPORINE, diphenhydrAMINE, diphenhydrAMINE, HYDROmorphone, naloxone, ondansetron (ZOFRAN) IV, ondansetron, sodium chloride  Assesment: She is status post colon resection. She is anemic. That may be the cause of her sensation of tachycardia. She has received 2 units of packed red blood cells today. Her chest is clear her and I think the Avelox has helped.  Active Problems:  * No active hospital problems. *     Plan:no change in treatments I'll have her continue with everything else and she will receive blood today.     LOS: 3 days   [name] 02/02/2012, 8:38 AM

## 2012-02-02 NOTE — Consult Note (Signed)
MEDICATION RELATED CONSULT NOTE - follow up  Pharmacy Consult for Phosphorous replacement Indication: hypophosphatemia  Allergies  Allergen Reactions  . Ativan (Lorazepam) Other (See Comments)    Hallucinations  . Clarithromycin Itching and Nausea And Vomiting  . Nubain (Nalbuphine Hcl) Other (See Comments)    Deathly sick  . Pseudoephedrine Hives  . Sulfa Antibiotics Nausea And Vomiting  . Factive (Gemifloxacin Mesylate) Rash  . Prochlorperazine Edisylate Anxiety  . Promethazine Hcl Anxiety  . Tofranil-Pm Rash  . Trimethobenzamide Hcl Anxiety   Patient Measurements:     Vital Signs: Temp: 98.6 F (37 C) (02/04 0235) Temp src: Oral (02/04 0235) BP: 118/69 mmHg (02/04 0415) Pulse Rate: 95  (02/04 0415) Intake/Output from previous day: 02/03 0701 - 02/04 0700 In: 120 [P.O.:120] Out: 1850 [Urine:1850] Intake/Output from this shift:    Labs: BMET    Component Value Date/Time   NA 136 02/02/2012 0515   NA 139 11/12/2011 0928   K 3.4* 02/02/2012 0515   K 4.6 11/12/2011 0928   CL 102 02/02/2012 0515   CL 105 11/12/2011 0928   CO2 28 02/02/2012 0515   CO2 26 11/12/2011 0928   GLUCOSE 94 02/02/2012 0515   BUN 4* 02/02/2012 0515   BUN 10 11/12/2011 0928   CREATININE 0.65 02/02/2012 0515   CREATININE 0.81 11/12/2011 0928   CALCIUM 7.6* 02/02/2012 0515   CALCIUM 9.4 11/12/2011 0928   GFRNONAA >90 02/02/2012 0515   GFRAA >90 02/02/2012 0515    Basename 02/02/12 0515 02/01/12 0719 02/01/12 0645 01/31/12 0640  WBC 7.9 9.4 -- 11.2*  HGB 6.8* 7.4* -- 9.2*  HCT 19.4* 21.0* -- 26.2*  PLT 257 239 -- 272  APTT -- -- -- --  CREATININE 0.65 -- 0.63 0.72  LABCREA -- -- -- --  CREATININE 0.65 -- 0.63 0.72  CREAT24HRUR -- -- -- --  MG 1.6 -- 1.7 1.7  PHOS 2.2* -- 1.9* 3.1  ALBUMIN -- -- -- --  PROT -- -- -- --  ALBUMIN -- -- -- --  AST -- -- -- --  ALT -- -- -- --  ALKPHOS -- -- -- --  BILITOT -- -- -- --  BILIDIR -- -- -- --  IBILI -- -- -- --   The CrCl is unknown because both a  height and weight (above a minimum accepted value) are required for this calculation.  Microbiology: Recent Results (from the past 720 hour(s))  SURGICAL PCR SCREEN     Status: Normal   Collection Time   01/05/12  7:15 AM      Component Value Range Status Comment   MRSA, PCR NEGATIVE  NEGATIVE  Final    Staphylococcus aureus NEGATIVE  NEGATIVE  Final   URINE CULTURE     Status: Normal   Collection Time   01/16/12  5:55 PM      Component Value Range Status Comment   Specimen Description URINE, CLEAN CATCH   Final    Special Requests NONE   Final    Culture  Setup Time [id number]   Final    Colony Count NO GROWTH   Final    Culture NO GROWTH   Final    Report Status 01/18/2012 FINAL   Final    Medical History: Past Medical History  Diagnosis Date  . Arthritis   . COPD (chronic obstructive pulmonary disease)   . Bronchitis   . Fibromyalgia   . Interstitial cystitis   . Migraine   . Depression with anxiety   .  UTI (lower urinary tract infection)   . RLS (restless legs syndrome)   . Diverticulitis of colon (without mention of hemorrhage)   . Alopecia, unspecified   . Cervicalgia   . Tobacco use disorder   . PTSD (post-traumatic stress disorder)   . IBS (irritable bowel syndrome)   . GERD (gastroesophageal reflux disease)   . Dysphagia   . PONV (postoperative nausea and vomiting)   . Shortness of breath   . Kidney stones   . Anxiety   . Carcinoid tumor of appendix    Assessment: Still with hypophosphatemia despite of NA Phos yesterday  Goal of Therapy:  Replenish Phos to WNL  Plan:  Repeat Sodium Phosphates today iv over 6 hours F/U phos level tomorrow  Valrie Hart A 02/02/2012,9:04 AM

## 2012-02-02 NOTE — Progress Notes (Signed)
D-Pt complained of hearing her heart beat in her ears.  Her neuro assessment is unremarkable and vital signs are WDL.  Sas had 2 small bowel movements tonight that were bloody and requests Dr. Lovell Sheehan be called about the sound in her ears and the 2 bloody bowel movements. A-Pt reassured that all assessments were normal at this time.  She insisted that Dr. Lovell Sheehan be called.  I called Dr. Lovell Sheehan and notified him of the above. R-No new orders were received from Dr. Lovell Sheehan he stated he would call the patient himself.  Upon checking on patient she stated Dr. Lovell Sheehan had called her and she was tearful.  I sat with patient and tried to reassure her her body had been through a lot with surgery and it would take time to heal.  She seemed more calm after the conversation.  Nursing staff to continue to monitor.

## 2012-02-02 NOTE — Progress Notes (Signed)
CRITICAL VALUE ALERT  Critical value received:  HBG=6.8  Date of notification:  02/02/12  Time of notification:  0610  Critical value read back:yes  Nurse who received alert:  Jinny Sanders, RN  MD notified (1st page):  Dr. Lovell Sheehan  Time of first page:  0620  MD notified (2nd page):  Time of second page:  Responding MD:  Dr. Lovell Sheehan  Time MD responded:  [phone]

## 2012-02-03 ENCOUNTER — Encounter (HOSPITAL_COMMUNITY): Payer: Self-pay | Admitting: Internal Medicine

## 2012-02-03 DIAGNOSIS — J209 Acute bronchitis, unspecified: Secondary | ICD-10-CM | POA: Clinically undetermined

## 2012-02-03 LAB — BASIC METABOLIC PANEL
CO2: 28 mEq/L (ref 19–32)
Calcium: 7.6 mg/dL — ABNORMAL LOW (ref 8.4–10.5)
GFR calc Af Amer: >90 mL/min (ref >90)
GFR calc non Af Amer: >90 mL/min (ref >90)
Glucose, Bld: 107 mg/dL — ABNORMAL HIGH (ref 70–99)
Potassium: 3.9 mEq/L (ref 3.5–5.1)
Sodium: 142 mEq/L (ref 135–145)

## 2012-02-03 LAB — TYPE AND SCREEN: Unit division: 0

## 2012-02-03 LAB — PHOSPHORUS: Phosphorus: 2.4 mg/dL (ref 2.3–4.6)

## 2012-02-03 LAB — CBC
Hemoglobin: 9.1 g/dL — ABNORMAL LOW (ref 12.0–15.0)
MCH: 31 pg (ref 26.0–34.0)
Platelets: 263 10*3/uL (ref 150–400)
RBC: 2.94 MIL/uL — ABNORMAL LOW (ref 3.87–5.11)

## 2012-02-03 MED ORDER — OXYCODONE-ACETAMINOPHEN 5-325 MG PO TABS
1.0000 | ORAL_TABLET | ORAL | Status: DC | PRN
  Administered 2012-02-03 – 2012-02-04 (×3): 2 via ORAL
  Filled 2012-02-03 (×3): qty 2

## 2012-02-03 MED ORDER — ACYCLOVIR 5 % EX CREA
TOPICAL_CREAM | CUTANEOUS | Status: DC
  Administered 2012-02-03 – 2012-02-04 (×8): via TOPICAL
  Filled 2012-02-03: qty 30

## 2012-02-03 MED ORDER — SODIUM CHLORIDE 0.9 % IV SOLN
1.0000 g | INTRAVENOUS | Status: DC | PRN
  Administered 2012-01-30: 1 g via INTRAVENOUS

## 2012-02-03 NOTE — Progress Notes (Signed)
Subjective: She says she feels better after blood transfusion. Her hemoglobin is over 9 now. She does complain that she is getting a fever blister on her lip. She is not short of breath.  Objective: Vital signs in last 24 hours: Temp:  [97.6 F (36.4 C)-98.6 F (37 C)] 98.2 F (36.8 C) (02/05 0605) Pulse Rate:  [81-95] 90  (02/05 0605) Resp:  [16-20] 16  (02/05 0605) BP: (87-119)/(54-78) 87/54 mmHg (02/05 0605) SpO2:  [91 %-100 %] 91 % (02/05 0752) Weight:  [72.802 kg (160 lb 8 oz)] 72.802 kg (160 lb 8 oz) (02/04 1502) Weight change:  Last BM Date: 02/02/12  Intake/Output from previous day: 02/04 0701 - 02/05 0700 In: 4251.7 [P.O.:240; I.V.:2961.7; Blood:1050] Out: -   PHYSICAL EXAM General appearance: alert, cooperative and mild distress Resp: clear to auscultation bilaterally Cardio: regular rate and rhythm, S1, S2 normal, no murmur, click, rub or gallop GI: Not examined due to to surgery Extremities: extremities normal, atraumatic, no cyanosis or edema  Lab Results:    Basic Metabolic Panel:  Basename 02/03/12 0512 02/02/12 0515 02/01/12 0645  NA 142 136 --  K 3.9 3.4* --  CL 108 102 --  CO2 28 28 --  GLUCOSE 107* 94 --  BUN 4* 4* --  CREATININE 0.56 0.65 --  CALCIUM 7.6* 7.6* --  MG -- 1.6 1.7  PHOS 2.4 2.2* --   Liver Function Tests: No results found for this basename: AST:2,ALT:2,ALKPHOS:2,BILITOT:2,PROT:2,ALBUMIN:2 in the last 72 hours No results found for this basename: LIPASE:2,AMYLASE:2 in the last 72 hours No results found for this basename: AMMONIA:2 in the last 72 hours CBC:  Basename 02/03/12 0512 02/02/12 0515  WBC 8.4 7.9  NEUTROABS -- --  HGB 9.1* 6.8*  HCT 25.7* 19.4*  MCV 87.4 89.0  PLT 263 257   Cardiac Enzymes: No results found for this basename: CKTOTAL:3,CKMB:3,CKMBINDEX:3,TROPONINI:3 in the last 72 hours BNP: No results found for this basename: PROBNP:3 in the last 72 hours D-Dimer: No results found for this basename: DDIMER:2  in the last 72 hours CBG:  Basename 02/01/12 2005  GLUCAP 113*   Hemoglobin A1C: No results found for this basename: HGBA1C in the last 72 hours Fasting Lipid Panel: No results found for this basename: CHOL,HDL,LDLCALC,TRIG,CHOLHDL,LDLDIRECT in the last 72 hours Thyroid Function Tests: No results found for this basename: TSH,T4TOTAL,FREET4,T3FREE,THYROIDAB in the last 72 hours Anemia Panel: No results found for this basename: VITAMINB12,FOLATE,FERRITIN,TIBC,IRON,RETICCTPCT in the last 72 hours Coagulation: No results found for this basename: LABPROT:2,INR:2 in the last 72 hours Urine Drug Screen: Drugs of Abuse     Component Value Date/Time   LABOPIA POSITIVE* 04/23/2009 1637   COCAINSCRNUR NONE DETECTED 04/23/2009 1637   LABBENZ POSITIVE* 04/23/2009 1637   AMPHETMU NONE DETECTED 04/23/2009 1637   THCU NONE DETECTED 04/23/2009 1637   LABBARB  Value: NONE DETECTED        DRUG SCREEN FOR MEDICAL PURPOSES ONLY.  IF CONFIRMATION IS NEEDED FOR ANY PURPOSE, NOTIFY LAB WITHIN 5 DAYS.        LOWEST DETECTABLE LIMITS FOR URINE DRUG SCREEN Drug Class       Cutoff (ng/mL) Amphetamine      1000 Barbiturate      200 Benzodiazepine   200 Tricyclics       300 Opiates          300 Cocaine          300 THC  50 04/23/2009 1637    Alcohol Level: No results found for this basename: ETH:2 in the last 72 hours Urinalysis: No results found for this basename: COLORURINE:2,APPERANCEUR:2,LABSPEC:2,PHURINE:2,GLUCOSEU:2,HGBUR:2,BILIRUBINUR:2,KETONESUR:2,PROTEINUR:2,UROBILINOGEN:2,NITRITE:2,LEUKOCYTESUR:2 in the last 72 hours Misc. Labs:  ABGS No results found for this basename: PHART,PCO2,PO2ART,TCO2,HCO3 in the last 72 hours CULTURES No results found for this or any previous visit (from the past 240 hour(s)). Studies/Results: No results found.  Medications:  Scheduled:   . acyclovir cream   Topical Q3H  . alvimopan  12 mg Oral BID  . enoxaparin  40 mg Subcutaneous Q24H  . estradiol  0.1 mg  Transdermal 2 times weekly  . FLUoxetine  40 mg Oral Daily  . HYDROmorphone PCA 0.3 mg/mL   Intravenous Q4H  . HYDROmorphone PCA 0.3 mg/mL      . HYDROmorphone PCA 0.3 mg/mL      . moxifloxacin  400 mg Oral q1800  . sodium chloride      . sodium chloride      . sodium phosphate  Dextrose 5% IVPB  20 mmol Intravenous Once   Continuous:   . dextrose 5 % and 0.45 % NaCl with KCl 40 mEq/L 50 mL/hr at 02/02/12 2311   ZOX:[id number], ALPRAZolam, cycloSPORINE, diphenhydrAMINE, diphenhydrAMINE, HYDROmorphone, naloxone, ondansetron (ZOFRAN) IV, ondansetron, sodium chloride  Assesment: She had abdominal surgery. She is doing better with that. She was fairly markedly anemic and received blood and that has improved. She feels better since the blood transfusion. She has a herpetic lesion on her lip Active Problems:  * No active hospital problems. *     Plan: She will be started on Zovirax ointment continue with her other treatments.    LOS: 4 days   Beulah Matusek L 02/03/2012, 8:34 AM

## 2012-02-03 NOTE — Progress Notes (Signed)
4 Days Post-Op  Subjective: Tinnitus has resolved. Feels much better.  Objective: Vital signs in last 24 hours: Temp:  [97.6 F (36.4 C)-98.6 F (37 C)] 98.2 F (36.8 C) (02/05 0605) Pulse Rate:  [81-95] 90  (02/05 0605) Resp:  [16-20] 16  (02/05 0605) BP: (87-119)/(54-78) 87/54 mmHg (02/05 0605) SpO2:  [91 %-100 %] 91 % (02/05 0752) Weight:  [72.802 kg (160 lb 8 oz)] 72.802 kg (160 lb 8 oz) (02/04 1502) Last BM Date: 02/02/12  Intake/Output from previous day: 02/04 0701 - 02/05 0700 In: 4251.7 [P.O.:240; I.V.:2961.7; Blood:1050] Out: -  Intake/Output this shift:    General appearance: alert, cooperative and no distress Resp: clear to auscultation bilaterally Cardio: regular rate and rhythm, S1, S2 normal, no murmur, click, rub or gallop GI: soft, non-tender; bowel sounds normal; no masses,  no organomegaly incisions healing well.  Lab Results:   [facility] 02/03/12 0512 02/02/12 0515  WBC 8.4 7.9  HGB 9.1* 6.8*  HCT 25.7* 19.4*  PLT 263 257   BMET  Basename 02/03/12 0512 02/02/12 0515  NA 142 136  K 3.9 3.4*  CL 108 102  CO2 28 28  GLUCOSE 107* 94  BUN 4* 4*  CREATININE 0.56 0.65  CALCIUM 7.6* 7.6*   PT/INR No results found for this basename: LABPROT:2,INR:2 in the last 72 hours  Studies/Results: No results found.  Anti-infectives: Anti-infectives     Start     Dose/Rate Route Frequency Ordered Stop   02/01/12 1800   moxifloxacin (AVELOX) tablet 400 mg        400 mg Oral Daily-1800 02/01/12 0745     01/30/12 1115   ertapenem (INVANZ) 1 g in sodium chloride 0.9 % 50 mL IVPB  Status:  Discontinued        1 g 100 mL/hr over 30 Minutes Intravenous 60 min pre-op 01/30/12 1108 01/30/12 1503   01/30/12 1114   sodium chloride 0.9 % with ertapenem [facility]) ADS Med     Comments: Gaye Alken: cabinet override         01/30/12 1114 01/30/12 2314          Assessment/Plan: s/p Procedure(s): HAND ASSISTED LAPAROSCOPIC COLON RESECTION PARTIAL  COLECTOMY Impression: Feels much better after blood transfusions. Final pathology is negative for any carcinoid tumor. Advance to regular diet. Anticipate discharge in next 24-48 hours. Will check CBC tomorrow in a.m.  LOS: 4 days    Cass Edinger A 02/03/2012

## 2012-02-03 NOTE — Addendum Note (Signed)
Addendum  created 02/03/12 1138 by Glynn Octave, CRNA   Modules edited:Anesthesia Medication Administration

## 2012-02-03 NOTE — Consult Note (Signed)
MEDICATION RELATED CONSULT NOTE - follow up  Pharmacy Consult for Phosphorous replacement Indication: hypophosphatemia  Allergies  Allergen Reactions  . Ativan (Lorazepam) Other (See Comments)    Hallucinations  . Clarithromycin Itching and Nausea And Vomiting  . Nubain (Nalbuphine Hcl) Other (See Comments)    Deathly sick  . Pseudoephedrine Hives  . Sulfa Antibiotics Nausea And Vomiting  . Factive (Gemifloxacin Mesylate) Rash  . Prochlorperazine Edisylate Anxiety  . Promethazine Hcl Anxiety  . Tofranil-Pm Rash  . Trimethobenzamide Hcl Anxiety   Patient Measurements: Height: 5\' 2"  (157.5 cm) Weight: 160 lb 8 oz (72.802 kg) IBW/kg (Calculated) : 50.1    Vital Signs: Temp: 98.2 F (36.8 C) (02/05 0605) Temp src: Oral (02/05 0605) BP: 87/54 mmHg (02/05 0605) Pulse Rate: 90  (02/05 0605) Intake/Output from previous day: 02/04 0701 - 02/05 0700 In: 4251.7 [P.O.:240; I.V.:2961.7; Blood:1050] Out: -  Intake/Output from this shift: Total I/O In: -  Out: 300 [Urine:300]  Labs: BMET    Component Value Date/Time   NA 142 02/03/2012 0512   NA 139 11/12/2011 0928   K 3.9 02/03/2012 0512   K 4.6 11/12/2011 0928   CL 108 02/03/2012 0512   CL 105 11/12/2011 0928   CO2 28 02/03/2012 0512   CO2 26 11/12/2011 0928   GLUCOSE 107* 02/03/2012 0512   BUN 4* 02/03/2012 0512   BUN 10 11/12/2011 0928   CREATININE 0.56 02/03/2012 0512   CREATININE 0.81 11/12/2011 0928   CALCIUM 7.6* 02/03/2012 0512   CALCIUM 9.4 11/12/2011 0928   GFRNONAA >90 02/03/2012 0512   GFRAA >90 02/03/2012 0512    Basename 02/03/12 0512 02/02/12 0515 02/01/12 0719 02/01/12 0645  WBC 8.4 7.9 9.4 --  HGB 9.1* 6.8* 7.4* --  HCT 25.7* 19.4* 21.0* --  PLT 263 257 239 --  APTT -- -- -- --  CREATININE 0.56 0.65 -- 0.63  LABCREA -- -- -- --  CREATININE 0.56 0.65 -- 0.63  CREAT24HRUR -- -- -- --  MG -- 1.6 -- 1.7  PHOS 2.4 2.2* -- 1.9*  ALBUMIN -- -- -- --  PROT -- -- -- --  ALBUMIN -- -- -- --  AST -- -- -- --  ALT  -- -- -- --  ALKPHOS -- -- -- --  BILITOT -- -- -- --  BILIDIR -- -- -- --  IBILI -- -- -- --   Estimated Creatinine Clearance: 79.5 ml/min (by C-G formula based on Cr of 0.56).  Microbiology: Recent Results (from the past 720 hour(s))  SURGICAL PCR SCREEN     Status: Normal   Collection Time   01/05/12  7:15 AM      Component Value Range Status Comment   MRSA, PCR NEGATIVE  NEGATIVE  Final    Staphylococcus aureus NEGATIVE  NEGATIVE  Final   URINE CULTURE     Status: Normal   Collection Time   01/16/12  5:55 PM      Component Value Range Status Comment   Specimen Description URINE, CLEAN CATCH   Final    Special Requests NONE   Final    Culture  Setup Time [id number]   Final    Colony Count NO GROWTH   Final    Culture NO GROWTH   Final    Report Status 01/18/2012 FINAL   Final    Medical History: Past Medical History  Diagnosis Date  . Arthritis   . COPD (chronic obstructive pulmonary disease)   . Bronchitis   .  Fibromyalgia   . Interstitial cystitis   . Migraine   . Depression with anxiety   . UTI (lower urinary tract infection)   . RLS (restless legs syndrome)   . Diverticulitis of colon (without mention of hemorrhage)   . Alopecia, unspecified   . Cervicalgia   . Tobacco use disorder   . PTSD (post-traumatic stress disorder)   . IBS (irritable bowel syndrome)   . GERD (gastroesophageal reflux disease)   . Dysphagia   . PONV (postoperative nausea and vomiting)   . Shortness of breath   . Kidney stones   . Anxiety   . Carcinoid tumor of appendix    Assessment: Phosphorous now WNL.  Goal of Therapy:  Replenish Phos to WNL  Plan:  Repeat phos level tomorrow. No doses needed today.  Mady Gemma 02/03/2012,10:47 AM

## 2012-02-04 ENCOUNTER — Encounter (HOSPITAL_COMMUNITY): Payer: Self-pay | Admitting: General Surgery

## 2012-02-04 DIAGNOSIS — D3A020 Benign carcinoid tumor of the appendix: Secondary | ICD-10-CM

## 2012-02-04 LAB — CBC
HCT: 27.2 % — ABNORMAL LOW (ref 36.0–46.0)
MCH: 31.3 pg (ref 26.0–34.0)
MCHC: 35.3 g/dL (ref 30.0–36.0)
RDW: 13.5 % (ref 11.5–15.5)

## 2012-02-04 LAB — PHOSPHORUS: Phosphorus: 2.1 mg/dL — ABNORMAL LOW (ref 2.3–4.6)

## 2012-02-04 MED ORDER — VALACYCLOVIR HCL 500 MG PO TABS
500.0000 mg | ORAL_TABLET | Freq: Three times a day (TID) | ORAL | Status: DC
  Administered 2012-02-04: 500 mg via ORAL
  Filled 2012-02-04: qty 1

## 2012-02-04 MED ORDER — OXYCODONE-ACETAMINOPHEN 10-325 MG PO TABS: 1.0000 | ORAL_TABLET | ORAL | Status: DC | PRN

## 2012-02-04 NOTE — Progress Notes (Signed)
Patient given discharge instructions with no questions. Prescriptions given and follow up appointment made with Dr. Lovell Sheehan office for 02/10/2012 at [facility].

## 2012-02-04 NOTE — Discharge Summary (Signed)
Physician Discharge Summary  Patient ID: CYRA SPADER MRN: [id number] DOB/AGE: [birth year] 51 y.o.  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 After discussion with Dr. Laurie Panda of oncology, it was elected to proceed with a right hemicolectomy. She underwent a hand-assisted laparoscopic right hemicolectomy on 01/30/2012. She tolerated the surgery well. Postoperative course was remarkable for difficulty with pain control. She also had anemia secondary to surgery. She ultimately required 2 units of packed red blood cells. Her hematocrit is stable and she is not symptomatic at this time. Final pathology was negative for any lymph node involvement. She is being discharged home in good improving condition.  Treatments: surgery: Laparoscopic hand-assisted right hemicolectomy on 01/30/2012  Discharge Exam: Blood pressure 90/58, pulse 71, temperature 97 F (36.1 C), temperature source Oral, resp. rate 16, height 5\' 2"  (1.575 m), weight 72.802 kg (160 lb 8 oz), last menstrual period 12/29/1993, SpO2 96.00%. General appearance: alert, cooperative and no distress Resp: clear to auscultation bilaterally Cardio: regular rate and rhythm, S1, S2 normal, no murmur, click, rub or gallop GI: Soft, flat. Incisions healing well.  Disposition: Home or Self Care  Discharge Orders    Future Appointments: Provider: Department: Dept Phone: Center:   07/09/2012 9:30 AM Randall An, MD [facility] [phone] None     Medication List  As of 02/04/2012  9:21 AM   TAKE these medications         ACIPHEX 20  MG tablet   Generic drug: RABEprazole   Take 20 mg by mouth daily.      ALPRAZolam 1 MG tablet   Commonly known as: XANAX   Take 1 mg by mouth at bedtime as needed. For sleep      amphetamine-dextroamphetamine 20 MG tablet   Commonly known as: ADDERALL   Take 1 capsule by mouth Once daily as needed. For attention      cetirizine 10 MG tablet   Commonly known as: ZYRTEC   Take 10 mg by mouth daily.      cycloSPORINE 0.05 % ophthalmic emulsion   Commonly known as: RESTASIS   Place 1 drop into both eyes 2 (two) times daily as needed. For dry eyes      estradiol 0.1 MG/24HR   Commonly known as: VIVELLE-DOT   Place 1 patch onto the skin 2 (two) times a week.      FLUoxetine 40 MG capsule   Commonly known as: PROZAC   Take 40 mg by mouth daily.      hyoscyamine 0.125 MG SL tablet   Commonly known as: LEVSIN SL   Take 0.125 mg by mouth every 4 (four) hours as needed. For cramps      ondansetron 4 MG tablet   Commonly known as: ZOFRAN   Take 4 mg by mouth every 8 (eight) hours as needed. For nausea      oxyCODONE-acetaminophen 10-325 MG per tablet   Commonly known as: PERCOCET   Take 1 tablet by mouth every 4 (four) hours as needed for pain. For pain      pramipexole 0.5 MG tablet   Commonly known as: MIRAPEX   Take 0.5  mg by mouth at bedtime.      topiramate 50 MG tablet   Commonly known as: TOPAMAX   Take 25 mg by mouth daily.      VENTOLIN HFA 108 (90 BASE) MCG/ACT inhaler   Generic drug: albuterol   Inhale 2 puffs into the lungs every 6 (six) hours as needed. For shortness of breath           Follow-up Information    Follow up with Dalia Heading, MD. Schedule an appointment as soon as possible for a visit on 02/10/2012.   Contact information:   [address] [place] [address] [phone]          Signed: Dalia Heading 02/04/2012, 9:21 AM

## 2012-02-04 NOTE — Progress Notes (Signed)
Subjective: She says she has poor energy level and doesn't feel very well today but her chest is clearer.  Objective: Vital signs in last 24 hours: Temp:  [97 F (36.1 C)-98.9 F (37.2 C)] 97 F (36.1 C) (02/06 0654) Pulse Rate:  [71-82] 71  (02/06 0654) Resp:  [16-18] 16  (02/06 0654) BP: (90-143)/(58-84) 90/58 mmHg (02/06 0654) SpO2:  [96 %-97 %] 96 % (02/06 0654) Weight change:  Last BM Date: 02/03/12  Intake/Output from previous day: 02/05 0701 - 02/06 0700 In: 1820 [I.V.:1820] Out: 300 [Urine:300]  PHYSICAL EXAM General appearance: alert, cooperative and mild distress Resp: clear to auscultation bilaterally Cardio: regular rate and rhythm, S1, S2 normal, no murmur, click, rub or gallop GI: soft, non-tender; bowel sounds normal; no masses,  no organomegaly Extremities: extremities normal, atraumatic, no cyanosis or edema  Lab Results:    Basic Metabolic Panel:  Basename 02/04/12 0453 02/03/12 0512 02/02/12 0515  NA -- 142 136  K -- 3.9 3.4*  CL -- 108 102  CO2 -- 28 28  GLUCOSE -- 107* 94  BUN -- 4* 4*  CREATININE -- 0.56 0.65  CALCIUM -- 7.6* 7.6*  MG -- -- 1.6  PHOS 2.1* 2.4 --   Liver Function Tests: No results found for this basename: AST:2,ALT:2,ALKPHOS:2,BILITOT:2,PROT:2,ALBUMIN:2 in the last 72 hours No results found for this basename: LIPASE:2,AMYLASE:2 in the last 72 hours No results found for this basename: AMMONIA:2 in the last 72 hours CBC:  Basename 02/04/12 0453 02/03/12 0512  WBC 9.6 8.4  NEUTROABS -- --  HGB 9.6* 9.1*  HCT 27.2* 25.7*  MCV 88.6 87.4  PLT 323 263   Cardiac Enzymes: No results found for this basename: CKTOTAL:3,CKMB:3,CKMBINDEX:3,TROPONINI:3 in the last 72 hours BNP: No results found for this basename: PROBNP:3 in the last 72 hours D-Dimer: No results found for this basename: DDIMER:2 in the last 72 hours CBG:  Basename 02/01/12 2005  GLUCAP 113*   Hemoglobin A1C: No results found for this basename: HGBA1C in  the last 72 hours Fasting Lipid Panel: No results found for this basename: CHOL,HDL,LDLCALC,TRIG,CHOLHDL,LDLDIRECT in the last 72 hours Thyroid Function Tests: No results found for this basename: TSH,T4TOTAL,FREET4,T3FREE,THYROIDAB in the last 72 hours Anemia Panel: No results found for this basename: VITAMINB12,FOLATE,FERRITIN,TIBC,IRON,RETICCTPCT in the last 72 hours Coagulation: No results found for this basename: LABPROT:2,INR:2 in the last 72 hours Urine Drug Screen: Drugs of Abuse     Component Value Date/Time   LABOPIA POSITIVE* 04/23/2009 1637   COCAINSCRNUR NONE DETECTED 04/23/2009 1637   LABBENZ POSITIVE* 04/23/2009 1637   AMPHETMU NONE DETECTED 04/23/2009 1637   THCU NONE DETECTED 04/23/2009 1637   LABBARB  Value: NONE DETECTED        DRUG SCREEN FOR MEDICAL PURPOSES ONLY.  IF CONFIRMATION IS NEEDED FOR ANY PURPOSE, NOTIFY LAB WITHIN 5 DAYS.        LOWEST DETECTABLE LIMITS FOR URINE DRUG SCREEN Drug Class       Cutoff (ng/mL) Amphetamine      1000 Barbiturate      200 Benzodiazepine   200 Tricyclics       300 Opiates          300 Cocaine          300 THC              50 04/23/2009 1637    Alcohol Level: No results found for this basename: ETH:2 in the last 72 hours Urinalysis: No results found for this basename: COLORURINE:2,APPERANCEUR:2,LABSPEC:2,PHURINE:2,GLUCOSEU:2,HGBUR:2,BILIRUBINUR:2,KETONESUR:2,PROTEINUR:2,UROBILINOGEN:2,NITRITE:2,LEUKOCYTESUR:2 in  the last 72 hours Misc. Labs:  ABGS No results found for this basename: PHART,PCO2,PO2ART,TCO2,HCO3 in the last 72 hours CULTURES No results found for this or any previous visit (from the past 240 hour(s)). Studies/Results: No results found.  Medications:  Scheduled:   . alvimopan  12 mg Oral BID  . enoxaparin  40 mg Subcutaneous Q24H  . estradiol  0.1 mg Transdermal 2 times weekly  . FLUoxetine  40 mg Oral Daily  . moxifloxacin  400 mg Oral q1800  . valACYclovir  500 mg Oral TID  . DISCONTD: acyclovir cream   Topical  Q3H  . DISCONTD: HYDROmorphone PCA 0.3 mg/mL   Intravenous Q4H   Continuous:   . dextrose 5 % and 0.45 % NaCl with KCl 40 mEq/L 20 mL/hr (02/03/12 0939)   GEX:[id number], ALPRAZolam, cycloSPORINE, diphenhydrAMINE, diphenhydrAMINE, HYDROmorphone, naloxone, ondansetron (ZOFRAN) IV, ondansetron, oxyCODONE-acetaminophen, sodium chloride  Assesment: She had acute bronchitis and has COPD. She has had a colon resection. She was anemic which is better. She complains of poor energy level and of a herpetic lesion on her lab Active Problems:  COPD  Acute bronchitis    Plan: I switched to Valtrex because she says that the Zovirax ointment is not working    LOS: 5 days   Monserrate Blaschke L 02/04/2012, 8:51 AM

## 2012-02-08 ENCOUNTER — Emergency Department (HOSPITAL_COMMUNITY): Payer: Medicare Other

## 2012-02-08 ENCOUNTER — Inpatient Hospital Stay (HOSPITAL_COMMUNITY)
Admission: EM | Admit: 2012-02-08 | Discharge: 2012-02-10 | DRG: 390 | Disposition: A | Discharge status: Home / Self Care | Payer: Medicare Other | Attending: General Surgery | Admitting: General Surgery

## 2012-02-08 ENCOUNTER — Encounter (HOSPITAL_COMMUNITY): Payer: Self-pay

## 2012-02-08 DIAGNOSIS — K56609 Unspecified intestinal obstruction, unspecified as to partial versus complete obstruction: Principal | ICD-10-CM | POA: Diagnosis present

## 2012-02-08 DIAGNOSIS — R109 Unspecified abdominal pain: Secondary | ICD-10-CM

## 2012-02-08 DIAGNOSIS — J309 Allergic rhinitis, unspecified: Secondary | ICD-10-CM | POA: Diagnosis present

## 2012-02-08 DIAGNOSIS — Z888 Allergy status to other drugs, medicaments and biological substances status: Secondary | ICD-10-CM

## 2012-02-08 DIAGNOSIS — Z79899 Other long term (current) drug therapy: Secondary | ICD-10-CM

## 2012-02-08 DIAGNOSIS — F411 Generalized anxiety disorder: Secondary | ICD-10-CM | POA: Diagnosis present

## 2012-02-08 DIAGNOSIS — G2581 Restless legs syndrome: Secondary | ICD-10-CM | POA: Diagnosis present

## 2012-02-08 DIAGNOSIS — F4310 Post-traumatic stress disorder, unspecified: Secondary | ICD-10-CM | POA: Diagnosis present

## 2012-02-08 DIAGNOSIS — F329 Major depressive disorder, single episode, unspecified: Secondary | ICD-10-CM | POA: Diagnosis present

## 2012-02-08 DIAGNOSIS — M545 Low back pain, unspecified: Secondary | ICD-10-CM | POA: Diagnosis present

## 2012-02-08 DIAGNOSIS — F3289 Other specified depressive episodes: Secondary | ICD-10-CM | POA: Diagnosis present

## 2012-02-08 DIAGNOSIS — R1310 Dysphagia, unspecified: Secondary | ICD-10-CM | POA: Diagnosis present

## 2012-02-08 DIAGNOSIS — Z87442 Personal history of urinary calculi: Secondary | ICD-10-CM

## 2012-02-08 DIAGNOSIS — Z87440 Personal history of urinary (tract) infections: Secondary | ICD-10-CM

## 2012-02-08 DIAGNOSIS — K560 Paralytic ileus: Secondary | ICD-10-CM | POA: Diagnosis present

## 2012-02-08 DIAGNOSIS — Z859 Personal history of malignant neoplasm, unspecified: Secondary | ICD-10-CM

## 2012-02-08 DIAGNOSIS — J449 Chronic obstructive pulmonary disease, unspecified: Secondary | ICD-10-CM | POA: Diagnosis present

## 2012-02-08 DIAGNOSIS — D72829 Elevated white blood cell count, unspecified: Secondary | ICD-10-CM | POA: Diagnosis present

## 2012-02-08 DIAGNOSIS — K219 Gastro-esophageal reflux disease without esophagitis: Secondary | ICD-10-CM | POA: Diagnosis present

## 2012-02-08 DIAGNOSIS — Z87891 Personal history of nicotine dependence: Secondary | ICD-10-CM

## 2012-02-08 DIAGNOSIS — K589 Irritable bowel syndrome without diarrhea: Secondary | ICD-10-CM | POA: Diagnosis present

## 2012-02-08 DIAGNOSIS — IMO0001 Reserved for inherently not codable concepts without codable children: Secondary | ICD-10-CM | POA: Diagnosis present

## 2012-02-08 DIAGNOSIS — J4489 Other specified chronic obstructive pulmonary disease: Secondary | ICD-10-CM | POA: Diagnosis present

## 2012-02-08 DIAGNOSIS — K56600 Partial intestinal obstruction, unspecified as to cause: Secondary | ICD-10-CM

## 2012-02-08 DIAGNOSIS — Z882 Allergy status to sulfonamides status: Secondary | ICD-10-CM

## 2012-02-08 DIAGNOSIS — R112 Nausea with vomiting, unspecified: Secondary | ICD-10-CM | POA: Diagnosis present

## 2012-02-08 LAB — URINALYSIS, ROUTINE W REFLEX MICROSCOPIC
Bilirubin Urine: NEGATIVE
Glucose, UA: NEGATIVE mg/dL
Hgb urine dipstick: NEGATIVE
Ketones, ur: 40 mg/dL — AB
Nitrite: NEGATIVE
pH: 8 (ref 5.0–8.0)

## 2012-02-08 LAB — COMPREHENSIVE METABOLIC PANEL
AST: 14 U/L (ref 0–37)
Albumin: 3.6 g/dL (ref 3.5–5.2)
Chloride: 101 mEq/L (ref 96–112)
Creatinine, Ser: 0.61 mg/dL (ref 0.50–1.10)
Potassium: 3.6 mEq/L (ref 3.5–5.1)
Total Bilirubin: 0.4 mg/dL (ref 0.3–1.2)
Total Protein: 6.8 g/dL (ref 6.0–8.3)

## 2012-02-08 LAB — CBC
HCT: 38.6 % (ref 36.0–46.0)
MCH: 31.4 pg (ref 26.0–34.0)
MCV: 87.9 fL (ref 78.0–100.0)
Platelets: 504 10*3/uL — ABNORMAL HIGH (ref 150–400)
RBC: 4.39 MIL/uL (ref 3.87–5.11)

## 2012-02-08 MED ORDER — ONDANSETRON HCL 4 MG/2ML IJ SOLN
4.0000 mg | Freq: Once | INTRAMUSCULAR | Status: AC
  Administered 2012-02-08: 4 mg via INTRAVENOUS
  Filled 2012-02-08: qty 2

## 2012-02-08 MED ORDER — IOHEXOL 300 MG/ML  SOLN
40.0000 mL | Freq: Once | INTRAMUSCULAR | Status: AC | PRN
  Administered 2012-02-08 (×2): 40 mL via ORAL

## 2012-02-08 MED ORDER — IOHEXOL 300 MG/ML  SOLN
100.0000 mL | Freq: Once | INTRAMUSCULAR | Status: AC | PRN
  Administered 2012-02-08: 100 mL via INTRAVENOUS

## 2012-02-08 MED ORDER — HYDROMORPHONE HCL PF 1 MG/ML IJ SOLN
1.0000 mg | Freq: Once | INTRAMUSCULAR | Status: AC
  Administered 2012-02-08: 1 mg via INTRAVENOUS
  Filled 2012-02-08: qty 1

## 2012-02-08 MED ORDER — SODIUM CHLORIDE 0.9 % IV BOLUS (SEPSIS)
1000.0000 mL | Freq: Once | INTRAVENOUS | Status: AC
  Administered 2012-02-08: 1000 mL via INTRAVENOUS

## 2012-02-08 MED ORDER — IOHEXOL 300 MG/ML  SOLN: 40.0000 mL | Freq: Once | INTRAMUSCULAR | Status: AC | PRN

## 2012-02-08 NOTE — ED Notes (Signed)
Pt presents with increased abdominal pain, vomiting, diarrhea and dizziness. Pt had abdominal surgery 10 days ago.

## 2012-02-08 NOTE — ED Provider Notes (Signed)
History     CSN: [id number]  Arrival date & time 02/08/12  1708   First MD Initiated Contact with Patient 02/08/12 1744      Chief Complaint  Patient presents with  . Abdominal Pain  . Emesis  . Diarrhea  . Dizziness    (Consider location/radiation/quality/duration/timing/severity/associated sxs/prior treatment) Patient is a 51 y.o. female presenting with abdominal pain, vomiting, and diarrhea. The history is provided by the patient.  Abdominal Pain The primary symptoms of the illness include abdominal pain, vomiting and diarrhea. The primary symptoms of the illness do not include fever, shortness of breath or dysuria.  Symptoms associated with the illness do not include chills or back pain.  Emesis  Associated symptoms include abdominal pain and diarrhea. Pertinent negatives include no chills, no cough, no fever and no headaches.  Diarrhea The primary symptoms include abdominal pain, vomiting and diarrhea. Primary symptoms do not include fever, dysuria or rash.  The illness does not include chills or back pain.  pt staes 01/30/12 had r hemicolectomy, due to carcinoid of appendix. Denies complications. States did continue to have pain postoperatively. Pain worse in past day. Pain mid abd, dull, constant, non radiating, no specific exacerbating or alleviating factors. . Today w nv, loose bm x 2. Emesis clear, not bloody or bilious. No bloody diarrhea. Denies fever or chills. No gu c/o. No back or flank pain. No cp or sob. No cough or uri c/o.   Past Medical History  Diagnosis Date  . Arthritis   . COPD (chronic obstructive pulmonary disease)   . Bronchitis   . Fibromyalgia   . Interstitial cystitis   . Migraine   . Depression with anxiety   . UTI (lower urinary tract infection)   . RLS (restless legs syndrome)   . Diverticulitis of colon (without mention of hemorrhage)   . Alopecia, unspecified   . Cervicalgia   . Tobacco use disorder   . PTSD (post-traumatic stress disorder)    . IBS (irritable bowel syndrome)   . GERD (gastroesophageal reflux disease)   . Dysphagia   . PONV (postoperative nausea and vomiting)   . Shortness of breath   . Kidney stones   . Anxiety   . Carcinoid tumor of appendix     Past Surgical History  Procedure Date  . Abdominal hysterectomy   . Cholecystectomy   . Tonsillectomy   . Cardiac catheterization   . Cystostomy w/ bladder dilation     x 52 [place]  . Esophageal dilation 01/01/2012    Procedure: ESOPHAGEAL DILATION;  Surgeon: [name] Ade, MD;  Location: [facility];  Service: Endoscopy;  Laterality: N/A;  maloney 14fr dialator  . Laparoscopic appendectomy 01/05/2012    Procedure: APPENDECTOMY LAPAROSCOPIC;  Surgeon: Dalia Heading;  Location: [facility];  Service: General;  Laterality: N/A;  . Appendectomy 01/05/2012  . Givens capsule study 01/27/2012    Procedure: GIVENS CAPSULE STUDY;  Surgeon: [name] Ade, MD;  Location: [facility];  Service: Endoscopy;  Laterality: N/A;  7:30  . Colon resection 01/30/2012    Procedure: HAND ASSISTED LAPAROSCOPIC COLON RESECTION;  Surgeon: Dalia Heading, MD;  Location: [facility];  Service: General;  Laterality: N/A;  . Partial colectomy 01/30/2012    Procedure: PARTIAL COLECTOMY;  Surgeon: Dalia Heading, MD;  Location: [facility];  Service: General;  Laterality: N/A;    Family History  Problem Relation Age of Onset  . Coronary artery disease Mother   . Heart attack Mother   .  Pulmonary embolism Mother   . Alcohol abuse Father   . Diabetes Father   . COPD Father   . Lung disease Father   . Anesthesia problems Neg Hx   . Hypotension Neg Hx   . Malignant hyperthermia Neg Hx   . Pseudochol deficiency Neg Hx     History  Substance Use Topics  . Smoking status: Former Smoker -- 0.2 packs/day for 8 years    Types: Cigarettes    Quit date: 01/25/2012  . Smokeless tobacco: Never Used  . Alcohol Use: No    OB History    Grav Para Term Preterm Abortions TAB SAB Ect Mult Living   1 1 1        1       Review of Systems  Constitutional: Negative for fever and chills.  HENT: Negative for neck pain.   Eyes: Negative for redness.  Respiratory: Negative for cough and shortness of breath.   Cardiovascular: Negative for chest pain.  Gastrointestinal: Positive for vomiting, abdominal pain and diarrhea.  Genitourinary: Negative for dysuria and flank pain.  Musculoskeletal: Negative for back pain.  Skin: Negative for rash.  Neurological: Negative for headaches.  Hematological: Does not bruise/bleed easily.  Psychiatric/Behavioral: Negative for confusion.    Allergies  Ativan; Clarithromycin; Nubain; Pseudoephedrine; Sulfa antibiotics; Factive; Prochlorperazine edisylate; Promethazine hcl; Tofranil-pm; and Trimethobenzamide hcl  Home Medications   Current Outpatient Rx  Name Route Sig Dispense Refill  . ACIPHEX 20 MG PO TBEC Oral Take 20 mg by mouth daily.     Marland Kitchen ALPRAZOLAM 1 MG PO TABS Oral Take 1 mg by mouth at bedtime as needed. For sleep    . AMPHETAMINE-DEXTROAMPHETAMINE 20 MG PO TABS Oral Take 1 capsule by mouth Once daily as needed. For attention    . CETIRIZINE HCL 10 MG PO TABS Oral Take 10 mg by mouth daily.      . CYCLOSPORINE 0.05 % OP EMUL Both Eyes Place 1 drop into both eyes 2 (two) times daily as needed. For dry eyes    . ESTRADIOL 0.1 MG/24HR TD PTTW Transdermal Place 1 patch onto the skin 2 (two) times a week.      Marland Kitchen FLUOXETINE HCL 40 MG PO CAPS Oral Take 40 mg by mouth daily.     Marland Kitchen HYOSCYAMINE SULFATE 0.125 MG SL SUBL Oral Take 0.125 mg by mouth every 4 (four) hours as needed. For cramps    . ONDANSETRON HCL 4 MG PO TABS Oral Take 4 mg by mouth every 8 (eight) hours as needed. For nausea    . OXYCODONE-ACETAMINOPHEN 10-325 MG PO TABS Oral Take 1 tablet by mouth every 4 (four) hours as needed for pain. For pain 40 tablet 0  . PRAMIPEXOLE DIHYDROCHLORIDE 0.5 MG PO TABS Oral Take 0.5 mg by mouth at bedtime.     . TOPIRAMATE 50 MG PO TABS Oral Take 25 mg by mouth  daily.     . VENTOLIN HFA 108 (90 BASE) MCG/ACT IN AERS Inhalation Inhale 2 puffs into the lungs every 6 (six) hours as needed. For shortness of breath      BP 138/87  Pulse 95  Temp(Src) 98.2 F (36.8 C) (Oral)  Resp 23  Ht 5\' 2"  (1.575 m)  Wt 148 lb (67.132 kg)  BMI 27.07 kg/m2  SpO2 100%  LMP 12/29/1993  Physical Exam  Nursing note and vitals reviewed. Constitutional: She appears well-developed and well-nourished.  HENT:  Head: Atraumatic.  Eyes: Conjunctivae are normal. No scleral icterus.  Neck: Neck supple. No tracheal deviation present.  Cardiovascular: Normal rate, regular rhythm, normal heart sounds and intact distal pulses.   Pulmonary/Chest: Effort normal and breath sounds normal. No respiratory distress.  Abdominal: Soft. Normal appearance. She exhibits no distension and no mass. There is tenderness. There is no rebound and no guarding.       Mid abd tenderness. Surgical staples intact. No incarc hernia felt. No infection of wounds.   Genitourinary:       No cva tenderness  Musculoskeletal: She exhibits no edema.  Neurological: She is alert.  Skin: Skin is warm and dry. No rash noted.    ED Course  Procedures (including critical care time)   Labs Reviewed  COMPREHENSIVE METABOLIC PANEL  CBC  URINALYSIS, ROUTINE W REFLEX MICROSCOPIC  LIPASE, BLOOD    Results for orders placed during the hospital encounter of 02/08/12  COMPREHENSIVE METABOLIC PANEL      Component Value Range   Sodium 138  135 - 145 (mEq/L)   Potassium 3.6  3.5 - 5.1 (mEq/L)   Chloride 101  96 - 112 (mEq/L)   CO2 25  19 - 32 (mEq/L)   Glucose, Bld 108 (*) 70 - 99 (mg/dL)   BUN 9  6 - 23 (mg/dL)   Creatinine, Ser 1.61  0.50 - 1.10 (mg/dL)   Calcium 09.6  8.4 - 10.5 (mg/dL)   Total Protein 6.8  6.0 - 8.3 (g/dL)   Albumin 3.6  3.5 - 5.2 (g/dL)   AST 14  0 - 37 (U/L)   ALT 16  0 - 35 (U/L)   Alkaline Phosphatase 70  39 - 117 (U/L)   Total Bilirubin 0.4  0.3 - 1.2 (mg/dL)   GFR calc  non Af Amer >90  >90 (mL/min)   GFR calc Af Amer >90  >90 (mL/min)  CBC      Component Value Range   WBC 16.4 (*) 4.0 - 10.5 (K/uL)   RBC 4.39  3.87 - 5.11 (MIL/uL)   Hemoglobin 13.8  12.0 - 15.0 (g/dL)   HCT 04.5  40.9 - 81.1 (%)   MCV 87.9  78.0 - 100.0 (fL)   MCH 31.4  26.0 - 34.0 (pg)   MCHC 35.8  30.0 - 36.0 (g/dL)   RDW 91.4  78.2 - 95.6 (%)   Platelets 504 (*) 150 - 400 (K/uL)  URINALYSIS, ROUTINE W REFLEX MICROSCOPIC      Component Value Range   Color, Urine YELLOW  YELLOW    APPearance CLOUDY (*) CLEAR    Specific Gravity, Urine 1.020  1.005 - 1.030    pH 8.0  5.0 - 8.0    Glucose, UA NEGATIVE  NEGATIVE (mg/dL)   Hgb urine dipstick NEGATIVE  NEGATIVE    Bilirubin Urine NEGATIVE  NEGATIVE    Ketones, ur 40 (*) NEGATIVE (mg/dL)   Protein, ur NEGATIVE  NEGATIVE (mg/dL)   Urobilinogen, UA 0.2  0.0 - 1.0 (mg/dL)   Nitrite NEGATIVE  NEGATIVE    Leukocytes, UA NEGATIVE  NEGATIVE   LIPASE, BLOOD      Component Value Range   Lipase 14  11 - 59 (U/L)   Ct Abdomen Pelvis W Contrast  02/08/2012  *RADIOLOGY REPORT*  Clinical Data: Pain.  Status post surgery 10 days ago.  Carcinoid tumor of the appendix.  Status post right hemicolectomy.  CT ABDOMEN AND PELVIS WITH CONTRAST  Technique:  Multidetector CT imaging of the abdomen and pelvis was performed following the standard protocol during  bolus administration of intravenous contrast.  Contrast: OMNIPAQUE IOHEXOL 300 MG/ML IV SOLN, 1 OMNIPAQUE IOHEXOL 300 MG/ML IV SOLN  Comparison: CT abdomen and pelvis with contrast 01/03/2012.  Findings: Small bilateral pleural effusions are present with associated atelectasis.  The heart size is normal.  No significant pericardial effusion is present.  The liver and spleen are within normal limits.  Focal fatty infiltration at the falciform ligament is stable.  The stomach is mildly dilated.  The duodenum and pancreas are within normal limits.  The patient is status post cholecystectomy.  Mild free  fluid is present within the abdomen.  The adrenal glands and kidneys are normal.  The ureters and urinary bladder are unremarkable.  The sigmoid colon is within normal limits.  The colon is mostly collapsed.  Inflammatory changes are present about the anastomosis in the mid abdomen.  The proximal small bowel is dilated with loops measuring up to 2.7 cm.  There are some fluid levels.  There is a loop of small bowel in the right lower quadrant where the transition segment is.  There is moderate wall thickening at this point.  The more distal small bowel quickly tapers.  No free air is evident.  There is no focal abscess.  The patient is status post hysterectomy.  The ovaries are not discretely visualized.  The bone windows are unremarkable.  IMPRESSION:  1.  Partial small bowel obstruction with marked thickening of the bowel wall of the right lower quadrant.  This is the transition point although no discrete mechanical obstruction is identified. 2.  Moderate free fluid throughout the abdomen is likely reactive. 3.  The anastomosis appears to be intact following right hemicolectomy. 4.  Status post cholecystectomy. 5.  Small bilateral pleural effusions.  Original Report Authenticated By: Jamesetta Orleans. MATTERN, M.D.   Mm Digital Diagnostic Bilat  01/21/2012  *RADIOLOGY REPORT*  Clinical Data:  Diffuse breast pain and expressed bilateral nipple discharge that has been stable for many years  DIGITAL DIAGNOSTIC BILATERAL MAMMOGRAM WITH CAD  Comparison:  With priors  Findings:  There are scattered fibroglandular densities.  There is no new suspicious mass or malignant-type microcalcifications. Mammographic images were processed with CAD.  IMPRESSION: No evidence of malignancy in either breast.  Screening mammogram in 1 year is recommended.  BI-RADS CATEGORY 1:  Negative.  Original Report Authenticated By: Littie Deeds. ARCEO, M.D.     MDM  Iv ns bolus. Dilaudid 1 mg iv. zofran iv. Labs. Ct.  Pain persists. Repeat abd  exam, persistent tenderness, no rebound or guarding. Ct pending.  Additional zofran and ivf.  Ct back - discussed pt, vitals, labs, wbc, ct, etc with Dr Suzette Battiest - he will admit.  Discussed ct and labs w pt.  Pain, nausea persist. Dilaudid 1 mg iv. zofran iv.  Nausea resolved. No change in exam, persistently tender, but no peritoneal findings.         Suzi Roots, MD 02/08/12 2149

## 2012-02-08 NOTE — ED Notes (Signed)
Patient requesting more pain medication. Dr Arizona Constable aware. Awaiting orders.

## 2012-02-09 LAB — CBC
MCH: 31.1 pg (ref 26.0–34.0)
MCHC: 34.7 g/dL (ref 30.0–36.0)
Platelets: 456 10*3/uL — ABNORMAL HIGH (ref 150–400)
RBC: 3.95 MIL/uL (ref 3.87–5.11)
RDW: 13.6 % (ref 11.5–15.5)

## 2012-02-09 LAB — BASIC METABOLIC PANEL
Calcium: 9 mg/dL (ref 8.4–10.5)
GFR calc Af Amer: >90 mL/min (ref >90)
GFR calc non Af Amer: >90 mL/min (ref >90)
Sodium: 138 mEq/L (ref 135–145)

## 2012-02-09 MED ORDER — ENOXAPARIN SODIUM 40 MG/0.4ML ~~LOC~~ SOLN
40.0000 mg | Freq: Every day | SUBCUTANEOUS | Status: DC
  Administered 2012-02-09 – 2012-02-10 (×2): 40 mg via SUBCUTANEOUS
  Filled 2012-02-09 (×2): qty 0.4

## 2012-02-09 MED ORDER — AMPHETAMINE-DEXTROAMPHETAMINE 10 MG PO TABS
10.0000 mg | ORAL_TABLET | Freq: Every day | ORAL | Status: DC
  Filled 2012-02-09 (×2): qty 1

## 2012-02-09 MED ORDER — LACTATED RINGERS IV SOLN
INTRAVENOUS | Status: DC
  Administered 2012-02-09: 22:00:00 via INTRAVENOUS

## 2012-02-09 MED ORDER — SODIUM CHLORIDE 0.9 % IJ SOLN
INTRAMUSCULAR | Status: AC
  Administered 2012-02-09: 22:00:00
  Filled 2012-02-09: qty 3

## 2012-02-09 MED ORDER — ALBUTEROL SULFATE HFA 108 (90 BASE) MCG/ACT IN AERS
2.0000 | INHALATION_SPRAY | Freq: Four times a day (QID) | RESPIRATORY_TRACT | Status: DC | PRN
  Filled 2012-02-09: qty 6.7

## 2012-02-09 MED ORDER — HYDROMORPHONE HCL PF 1 MG/ML IJ SOLN
1.0000 mg | INTRAMUSCULAR | Status: DC | PRN
  Administered 2012-02-09: 1 mg via INTRAVENOUS
  Administered 2012-02-09: 2 mg via INTRAVENOUS
  Administered 2012-02-09: 1 mg via INTRAVENOUS
  Administered 2012-02-09 – 2012-02-10 (×7): 2 mg via INTRAVENOUS
  Filled 2012-02-09 (×2): qty 2
  Filled 2012-02-09: qty 1
  Filled 2012-02-09: qty 2
  Filled 2012-02-09: qty 1
  Filled 2012-02-09 (×5): qty 2
  Filled 2012-02-09: qty 1

## 2012-02-09 MED ORDER — PRAMIPEXOLE DIHYDROCHLORIDE 1 MG PO TABS
0.5000 mg | ORAL_TABLET | Freq: Every day | ORAL | Status: DC
  Administered 2012-02-09: 0.5 mg via ORAL
  Filled 2012-02-09: qty 1

## 2012-02-09 MED ORDER — CYCLOSPORINE 0.05 % OP EMUL
1.0000 [drp] | Freq: Two times a day (BID) | OPHTHALMIC | Status: DC
  Administered 2012-02-09 – 2012-02-10 (×3): 1 [drp] via OPHTHALMIC
  Filled 2012-02-09 (×6): qty 1

## 2012-02-09 MED ORDER — SODIUM CHLORIDE 0.9 % IJ SOLN
INTRAMUSCULAR | Status: AC
  Administered 2012-02-09: 21:00:00
  Filled 2012-02-09: qty 3

## 2012-02-09 MED ORDER — ESTRADIOL 0.1 MG/24HR TD PTWK
0.1000 mg | MEDICATED_PATCH | TRANSDERMAL | Status: DC
  Administered 2012-02-10: 0.1 mg via TRANSDERMAL
  Filled 2012-02-09: qty 1

## 2012-02-09 MED ORDER — PANTOPRAZOLE SODIUM 40 MG IV SOLR
40.0000 mg | Freq: Every day | INTRAVENOUS | Status: DC
  Administered 2012-02-09 (×2): 40 mg via INTRAVENOUS
  Filled 2012-02-09 (×2): qty 40

## 2012-02-09 MED ORDER — HYDROMORPHONE HCL PF 1 MG/ML IJ SOLN
1.0000 mg | Freq: Once | INTRAMUSCULAR | Status: AC
  Administered 2012-02-09: 1 mg via INTRAVENOUS

## 2012-02-09 MED ORDER — KETOROLAC TROMETHAMINE 30 MG/ML IJ SOLN
15.0000 mg | Freq: Four times a day (QID) | INTRAMUSCULAR | Status: AC
  Administered 2012-02-09 – 2012-02-10 (×3): 15 mg via INTRAVENOUS
  Administered 2012-02-10: 07:00:00 via INTRAVENOUS
  Filled 2012-02-09 (×4): qty 1

## 2012-02-09 MED ORDER — SODIUM CHLORIDE 0.9 % IV SOLN
1.0000 g | INTRAVENOUS | Status: DC
  Administered 2012-02-09 (×2): 1 g via INTRAVENOUS
  Filled 2012-02-09 (×3): qty 1

## 2012-02-09 MED ORDER — DIAZEPAM 5 MG PO TABS
5.0000 mg | ORAL_TABLET | Freq: Three times a day (TID) | ORAL | Status: DC | PRN
  Administered 2012-02-09 (×3): 5 mg via ORAL
  Filled 2012-02-09 (×2): qty 1

## 2012-02-09 MED ORDER — ERTAPENEM SODIUM 1 G IJ SOLR
INTRAMUSCULAR | Status: AC
  Filled 2012-02-09: qty 1

## 2012-02-09 MED ORDER — ONDANSETRON HCL 4 MG/2ML IJ SOLN
4.0000 mg | Freq: Four times a day (QID) | INTRAMUSCULAR | Status: DC | PRN
  Administered 2012-02-09 – 2012-02-10 (×5): 4 mg via INTRAVENOUS
  Filled 2012-02-09 (×5): qty 2

## 2012-02-09 NOTE — Progress Notes (Signed)
Patient ambulated in hallway.  Tolerated well.  

## 2012-02-10 LAB — CBC
Platelets: 399 10*3/uL (ref 150–400)
RBC: 3.36 MIL/uL — ABNORMAL LOW (ref 3.87–5.11)
RDW: 13.2 % (ref 11.5–15.5)
WBC: 8.8 10*3/uL (ref 4.0–10.5)

## 2012-02-10 LAB — BASIC METABOLIC PANEL
CO2: 27 mEq/L (ref 19–32)
Calcium: 8.3 mg/dL — ABNORMAL LOW (ref 8.4–10.5)
Chloride: 104 mEq/L (ref 96–112)
GFR calc Af Amer: >90 mL/min (ref >90)
Sodium: 138 mEq/L (ref 135–145)

## 2012-02-10 MED ORDER — HYDROMORPHONE HCL PF 1 MG/ML IJ SOLN
1.0000 mg | Freq: Once | INTRAMUSCULAR | Status: AC
  Administered 2012-02-10: 1 mg via INTRAVENOUS
  Filled 2012-02-10: qty 1

## 2012-02-10 NOTE — Discharge Summary (Signed)
Physician Discharge Summary  Patient ID: [name] MRN: [id number] DOB/AGE: 03/22/1961 51 y.o.  Admit date: 02/08/2012 Discharge date: 02/10/2012  Admission Diagnoses: Abdominal pain  Discharge Diagnoses: Abdominal pain, resolved Active Problems:  * No active hospital problems. 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 She reports that she had some diarrhea as well as nausea. She was seen in the emergency room and noted to have a leukocytosis. CT scan of the abdomen and pelvis was performed which revealed a possible small bowel obstruction versus ileus. She was admitted to the surgical service for further evaluation treatment. Her nausea resolved. Her abdominal pain resolved with Dilantin. Her leukocytosis has resolved. Her diarrhea has subsequently decreased. She is being discharged home in good improving condition. C. difficile is pending.  Significant Diagnostic Studies: radiology: CT scan: Results noted  Discharge Exam: Blood pressure 119/77, pulse 68, temperature 98.2 F (36.8 C), temperature source Oral, resp. rate 19, height 5\' 2"  (1.575 m), weight 70 kg (154 lb 5.2 oz), last menstrual period 12/29/1993, SpO2 97.00%. General appearance: alert, cooperative and no distress Resp: clear to auscultation bilaterally Cardio: regular rate and rhythm, S1, S2 normal, no murmur, click, rub or gallop GI: soft, non-tender; bowel sounds normal; no masses,  no organomegaly. Incisions healing well. Staples removed, Steri-Strips applied.  Disposition: Home or Self Care  Discharge Orders    Future Appointments: Provider: Department: Dept Phone: Center:   07/09/2012 9:30 AM Randall An, MD [facility] [phone] None     Medication List  As of 02/10/2012 10:25 AM   TAKE these  medications         ACIPHEX 20 MG tablet   Generic drug: RABEprazole   Take 20 mg by mouth daily.      ALPRAZolam 1 MG tablet   Commonly known as: XANAX   Take 1 mg by mouth at bedtime as needed. For sleep      amphetamine-dextroamphetamine 20 MG tablet   Commonly known as: ADDERALL   Take 1 capsule by mouth Once daily as needed. For attention      cetirizine 10 MG tablet   Commonly known as: ZYRTEC   Take 10 mg by mouth daily.      cycloSPORINE 0.05 % ophthalmic emulsion   Commonly known as: RESTASIS   Place 1 drop into both eyes 2 (two) times daily as needed. For dry eyes      estradiol 0.1 MG/24HR   Commonly known as: VIVELLE-DOT   Place 1 patch onto the skin 2 (two) times a week.      FLUoxetine 40 MG capsule   Commonly known as: PROZAC   Take 40 mg by mouth daily.      hyoscyamine 0.125 MG SL tablet   Commonly known as: LEVSIN SL   Take 0.125 mg by mouth every 4 (four) hours as needed. For cramps      ondansetron 4 MG tablet   Commonly known as: ZOFRAN   Take 4 mg by mouth every 8 (eight) hours as needed. For nausea      oxyCODONE-acetaminophen 10-325 MG per tablet   Commonly known as: PERCOCET   Take 1 tablet by mouth every 4 (four) hours as needed for pain. For pain      pramipexole 0.5 MG tablet   Commonly known as: MIRAPEX  Take 0.5 mg by mouth at bedtime.      topiramate 50 MG tablet   Commonly known as: TOPAMAX   Take 25 mg by mouth daily.      VENTOLIN HFA 108 (90 BASE) MCG/ACT inhaler   Generic drug: albuterol   Inhale 2 puffs into the lungs every 6 (six) hours as needed. For shortness of breath           Follow-up Information    Follow up with Dalia Heading, MD. Schedule an appointment as soon as possible for a visit on 02/24/2012.   Contact information:   [address] [place] [address] [phone]          Signed: Dalia Heading 02/10/2012, 10:25 AM

## 2012-02-10 NOTE — Progress Notes (Signed)
Went over discharge instruction paperwork with patient. Patient given discharge instructions handout. Patient instructed to call and make follow-up appointment, patient stated understanding that she must call and make the appointment. Patient voiced understanding of discharge instructions. Patient escorted off floor in wheelchair by tech. Patient in stable condition.

## 2012-02-17 NOTE — H&P (Signed)
Subjective:   Patient is a 51 y.o. female presents with abdominal pain, nausea, and vomiting.  This occurred soon after eating pizza. She was taking Percocet at home, but presented to the emergency room for further evaluation treatment. CT scan the abdomen and pelvis in the emergency room showed possible obstruction versus ileus. Patient states that she was having bowel movements regularly. Past history includes carcinoid tumor the appendix, IBS, anxiety and depression.  Previous surgery included recent laparoscopic hand-assisted right hemicolectomy, laparoscopic appendectomy for carcinoid tumor the appendix, chronic abdominal pain.  Patient Active Problem List  Diagnoses Date Noted  . Acute bronchitis 02/03/2012  . Nausea & vomiting 01/03/2012  . Abdominal pain 12/02/2011  . Chronic diarrhea 12/02/2011  . IBS (irritable bowel syndrome) 12/02/2011  . Weight loss 12/02/2011  . Dysphagia 12/02/2011  . ANXIETY 05/12/2007  . DRUG DEPENDENCE 05/12/2007  . DEPRESSION 05/12/2007  . ALLERGIC RHINITIS 05/12/2007  . ASTHMA 05/12/2007  . COPD 05/12/2007  . GERD 05/12/2007  . LOW BACK PAIN 05/12/2007   Past Medical History  Diagnosis Date  . Arthritis   . COPD (chronic obstructive pulmonary disease)   . Bronchitis   . Fibromyalgia   . Interstitial cystitis   . Migraine   . Depression with anxiety   . UTI (lower urinary tract infection)   . RLS (restless legs syndrome)   . Diverticulitis of colon (without mention of hemorrhage)   . Alopecia, unspecified   . Cervicalgia   . Tobacco use disorder   . PTSD (post-traumatic stress disorder)   . IBS (irritable bowel syndrome)   . GERD (gastroesophageal reflux disease)   . Dysphagia   . PONV (postoperative nausea and vomiting)   . Shortness of breath   . Kidney stones   . Anxiety   . Carcinoid tumor of appendix     Past Surgical History  Procedure Date  . Abdominal hysterectomy   . Cholecystectomy   . Tonsillectomy   . Cardiac  catheterization   . Cystostomy w/ bladder dilation     x 52 [place]  . Esophageal dilation 01/01/2012    Procedure: ESOPHAGEAL DILATION;  Surgeon: [name] Ade, MD;  Location: [facility];  Service: Endoscopy;  Laterality: N/A;  maloney 90fr dialator  . Laparoscopic appendectomy 01/05/2012    Procedure: APPENDECTOMY LAPAROSCOPIC;  Surgeon: Dalia Heading;  Location: [facility];  Service: General;  Laterality: N/A;  . Appendectomy 01/05/2012  . Givens capsule study 01/27/2012    Procedure: GIVENS CAPSULE STUDY;  Surgeon: [name] Ade, MD;  Location: [facility];  Service: Endoscopy;  Laterality: N/A;  7:30  . Colon resection 01/30/2012    Procedure: HAND ASSISTED LAPAROSCOPIC COLON RESECTION;  Surgeon: Dalia Heading, MD;  Location: [facility];  Service: General;  Laterality: N/A;  . Partial colectomy 01/30/2012    Procedure: PARTIAL COLECTOMY;  Surgeon: Dalia Heading, MD;  Location: [facility];  Service: General;  Laterality: N/A;    No prescriptions prior to admission   Allergies  Allergen Reactions  . Ativan (Lorazepam) Other (See Comments)    Hallucinations  . Clarithromycin Itching and Nausea And Vomiting  . Nubain (Nalbuphine Hcl) Other (See Comments)    Deathly sick  . Pseudoephedrine Hives  . Sulfa Antibiotics Nausea And Vomiting  . Factive (Gemifloxacin Mesylate) Rash  . Prochlorperazine Edisylate Anxiety  . Promethazine Hcl Anxiety  . Tofranil-Pm Rash  . Trimethobenzamide Hcl Anxiety    History  Substance Use Topics  . Smoking status: Former Smoker --  0.2 packs/day for 8 years    Types: Cigarettes    Quit date: 01/25/2012  . Smokeless tobacco: Never Used  . Alcohol Use: No    Family History  Problem Relation Age of Onset  . Coronary artery disease Mother   . Heart attack Mother   . Pulmonary embolism Mother   . Alcohol abuse Father   . Diabetes Father   . COPD Father   . Lung disease Father   . Anesthesia problems Neg Hx   . Hypotension Neg Hx   . Malignant hyperthermia  Neg Hx   . Pseudochol deficiency Neg Hx     Review of Systems Pertinent items are noted in HPI.  Objective:   No data found.         Well-developed and well-nourished white female in mild distress do to anxiety Lungs clear to auscultation with equal breath sounds bilaterally. Heart examination reveals a regular rate and rhythm without S3, S4, murmurs. Abdomen is soft. She points to her epigastric region as hurting. Incisions healing well. No hepatosplenomegaly, masses, or rigidity are noted.   Assessment:   Abdominal pain of unknown etiology, question incisional pain versus incisional pain from recent surgery.  Plan:   Admitted to the hospital for control of her pain and nausea. No need for acute surgical intervention warranted.

## 2012-05-06 ENCOUNTER — Other Ambulatory Visit (HOSPITAL_COMMUNITY): Payer: Self-pay | Admitting: Pulmonary Disease

## 2012-05-06 ENCOUNTER — Encounter (HOSPITAL_COMMUNITY): Payer: Self-pay

## 2012-05-06 ENCOUNTER — Ambulatory Visit (HOSPITAL_COMMUNITY)
Admission: RE | Admit: 2012-05-06 | Discharge: 2012-05-06 | Disposition: A | Discharge status: Home / Self Care | Payer: Medicare Other | Source: Ambulatory Visit | Attending: Pulmonary Disease | Admitting: Pulmonary Disease

## 2012-05-06 DIAGNOSIS — R918 Other nonspecific abnormal finding of lung field: Secondary | ICD-10-CM | POA: Insufficient documentation

## 2012-05-06 DIAGNOSIS — M542 Cervicalgia: Secondary | ICD-10-CM

## 2012-05-06 DIAGNOSIS — J449 Chronic obstructive pulmonary disease, unspecified: Secondary | ICD-10-CM

## 2012-05-06 DIAGNOSIS — R0602 Shortness of breath: Secondary | ICD-10-CM | POA: Insufficient documentation

## 2012-05-06 DIAGNOSIS — J4489 Other specified chronic obstructive pulmonary disease: Secondary | ICD-10-CM | POA: Insufficient documentation

## 2012-05-06 DIAGNOSIS — Z859 Personal history of malignant neoplasm, unspecified: Secondary | ICD-10-CM | POA: Insufficient documentation

## 2012-05-06 HISTORY — DX: Malignant (primary) neoplasm, unspecified: C80.1

## 2012-05-06 MED ORDER — IOHEXOL 300 MG/ML  SOLN
80.0000 mL | Freq: Once | INTRAMUSCULAR | Status: AC | PRN
  Administered 2012-05-06: 80 mL via INTRAVENOUS

## 2012-05-07 ENCOUNTER — Other Ambulatory Visit (HOSPITAL_COMMUNITY): Payer: Medicare Other

## 2012-05-07 ENCOUNTER — Ambulatory Visit (HOSPITAL_COMMUNITY): Payer: Medicare Other

## 2012-05-07 ENCOUNTER — Ambulatory Visit (HOSPITAL_COMMUNITY): Admission: RE | Admit: 2012-05-07 | Payer: Medicare Other | Source: Ambulatory Visit

## 2012-05-07 ENCOUNTER — Telehealth (HOSPITAL_COMMUNITY): Payer: Self-pay | Admitting: Oncology

## 2012-05-07 NOTE — Telephone Encounter (Signed)
05/07/2012 1:56 PM Phone (Incoming) [name], [name] L (Self) [phone] (M) PT WOULD LIKE ADVISE RE: A RECENT CT SHOWING A SPOT THAT HAS INCREASED IN SIZE. SHE HAS A MRI SCHEDULED FOR MON. SHE HAS SOME SWELLING IN HER NECK AND HAS SHOOTING/BURNING PAINS IN THE SIDE OF HER HEAD. SHE SAYS DR Juanetta Gosling WAS NOT CONCERNED ABOUT IT 05/07/12  1911  Unable to reach patient by phone.  Left message for her to call us back on Monday.

## 2012-05-10 ENCOUNTER — Inpatient Hospital Stay (HOSPITAL_COMMUNITY): Admission: RE | Admit: 2012-05-10 | Payer: Medicare Other | Source: Ambulatory Visit

## 2012-05-25 ENCOUNTER — Encounter (HOSPITAL_COMMUNITY): Payer: Self-pay | Admitting: *Deleted

## 2012-05-25 ENCOUNTER — Emergency Department (HOSPITAL_COMMUNITY): Payer: Medicare Other

## 2012-05-25 ENCOUNTER — Emergency Department (HOSPITAL_COMMUNITY)
Admission: EM | Admit: 2012-05-25 | Discharge: 2012-05-25 | Disposition: A | Discharge status: Home / Self Care | Payer: Medicare Other | Attending: Emergency Medicine | Admitting: Emergency Medicine

## 2012-05-25 DIAGNOSIS — R21 Rash and other nonspecific skin eruption: Secondary | ICD-10-CM | POA: Insufficient documentation

## 2012-05-25 DIAGNOSIS — G8929 Other chronic pain: Secondary | ICD-10-CM | POA: Insufficient documentation

## 2012-05-25 DIAGNOSIS — M129 Arthropathy, unspecified: Secondary | ICD-10-CM | POA: Insufficient documentation

## 2012-05-25 DIAGNOSIS — M542 Cervicalgia: Secondary | ICD-10-CM | POA: Insufficient documentation

## 2012-05-25 DIAGNOSIS — J449 Chronic obstructive pulmonary disease, unspecified: Secondary | ICD-10-CM | POA: Insufficient documentation

## 2012-05-25 DIAGNOSIS — R5381 Other malaise: Secondary | ICD-10-CM | POA: Insufficient documentation

## 2012-05-25 DIAGNOSIS — H9209 Otalgia, unspecified ear: Secondary | ICD-10-CM | POA: Insufficient documentation

## 2012-05-25 DIAGNOSIS — R51 Headache: Secondary | ICD-10-CM | POA: Insufficient documentation

## 2012-05-25 DIAGNOSIS — K589 Irritable bowel syndrome without diarrhea: Secondary | ICD-10-CM | POA: Insufficient documentation

## 2012-05-25 DIAGNOSIS — M545 Low back pain, unspecified: Secondary | ICD-10-CM | POA: Insufficient documentation

## 2012-05-25 DIAGNOSIS — F341 Dysthymic disorder: Secondary | ICD-10-CM | POA: Insufficient documentation

## 2012-05-25 DIAGNOSIS — M549 Dorsalgia, unspecified: Secondary | ICD-10-CM | POA: Insufficient documentation

## 2012-05-25 DIAGNOSIS — H538 Other visual disturbances: Secondary | ICD-10-CM | POA: Insufficient documentation

## 2012-05-25 DIAGNOSIS — J4489 Other specified chronic obstructive pulmonary disease: Secondary | ICD-10-CM | POA: Insufficient documentation

## 2012-05-25 DIAGNOSIS — J329 Chronic sinusitis, unspecified: Secondary | ICD-10-CM | POA: Insufficient documentation

## 2012-05-25 DIAGNOSIS — R6883 Chills (without fever): Secondary | ICD-10-CM | POA: Insufficient documentation

## 2012-05-25 DIAGNOSIS — Z79899 Other long term (current) drug therapy: Secondary | ICD-10-CM | POA: Insufficient documentation

## 2012-05-25 DIAGNOSIS — K219 Gastro-esophageal reflux disease without esophagitis: Secondary | ICD-10-CM | POA: Insufficient documentation

## 2012-05-25 LAB — DIFFERENTIAL
Basophils Absolute: 0 10*3/uL (ref 0.0–0.1)
Eosinophils Relative: 4 % (ref 0–5)
Lymphocytes Relative: 24 % (ref 12–46)
Lymphs Abs: 2.2 10*3/uL (ref 0.7–4.0)
Neutro Abs: 6.2 10*3/uL (ref 1.7–7.7)

## 2012-05-25 LAB — BASIC METABOLIC PANEL
CO2: 29 mEq/L (ref 19–32)
Calcium: 9.6 mg/dL (ref 8.4–10.5)
Chloride: 102 mEq/L (ref 96–112)
Glucose, Bld: 93 mg/dL (ref 70–99)
Sodium: 138 mEq/L (ref 135–145)

## 2012-05-25 LAB — CBC
HCT: 36.1 % (ref 36.0–46.0)
MCV: 88 fL (ref 78.0–100.0)
Platelets: 333 10*3/uL (ref 150–400)
RBC: 4.1 MIL/uL (ref 3.87–5.11)
WBC: 9.3 10*3/uL (ref 4.0–10.5)

## 2012-05-25 MED ORDER — HYDROMORPHONE HCL PF 1 MG/ML IJ SOLN
1.0000 mg | Freq: Once | INTRAMUSCULAR | Status: AC
  Administered 2012-05-25: 1 mg via INTRAVENOUS
  Filled 2012-05-25: qty 1

## 2012-05-25 MED ORDER — SODIUM CHLORIDE 0.9 % IV BOLUS (SEPSIS)
250.0000 mL | Freq: Once | INTRAVENOUS | Status: AC
  Administered 2012-05-25: 14:00:00 via INTRAVENOUS

## 2012-05-25 MED ORDER — ONDANSETRON HCL 4 MG/2ML IJ SOLN
4.0000 mg | Freq: Once | INTRAMUSCULAR | Status: AC
  Administered 2012-05-25: 4 mg via INTRAVENOUS
  Filled 2012-05-25: qty 2

## 2012-05-25 MED ORDER — OXYCODONE-ACETAMINOPHEN 5-325 MG PO TABS: 1.0000 | ORAL_TABLET | Freq: Four times a day (QID) | ORAL | Status: AC | PRN

## 2012-05-25 MED ORDER — SODIUM CHLORIDE 0.9 % IV SOLN: INTRAVENOUS | Status: DC

## 2012-05-25 MED ORDER — AMOXICILLIN-POT CLAVULANATE 875-125 MG PO TABS: 1.0000 | ORAL_TABLET | Freq: Two times a day (BID) | ORAL | Status: AC

## 2012-05-25 MED ORDER — GADOBENATE DIMEGLUMINE 529 MG/ML IV SOLN
14.0000 mL | Freq: Once | INTRAVENOUS | Status: AC | PRN
  Administered 2012-05-25: 14 mL via INTRAVENOUS

## 2012-05-25 NOTE — Discharge Instructions (Signed)
Trial of Augmentin for the sinusitis chronic and acute. Referrals made to ear nose and throat and neurology. MRI of neck without any changes since January. Return for new or worse symptoms. Percocet as needed for pain.

## 2012-05-25 NOTE — ED Notes (Signed)
Pt remains in mri.

## 2012-05-25 NOTE — ED Notes (Signed)
Pain rt side of head, neck , into rt ear. Tongue feels "tired"

## 2012-05-25 NOTE — ED Provider Notes (Signed)
History  This chart was scribed for Shelda Jakes, MD by Bennett Scrape. This patient was seen in room [facility] and the patient's care was started at 1:09PM.  CSN: [id number]  Arrival date & time 05/25/12  1248   First MD Initiated Contact with Patient 05/25/12 1309      Chief Complaint  Patient presents with  . Headache    Patient is a 51 y.o. female presenting with headaches. The history is provided by the patient. No language interpreter was used.  Headache  This is a recurrent problem. The current episode started more than 1 week ago. The problem occurs constantly. The problem has not changed since onset.The headache is associated with an unknown factor. The pain is located in the right unilateral region. The quality of the pain is described as sharp. The pain is at a severity of 7/10. The pain radiates to the right neck. Pertinent negatives include no fever, no shortness of breath, no nausea and no vomiting. She has tried oral narcotic analgesics for the symptoms. The treatment provided mild relief.    [name] is a 51 y.o. female who presents to the Emergency Department complaining of right-sided shooting HA that radiates to the right neck and right ear that has been ongoing since November 2012. Pt reports that she was diagnosed with sinus infections by her PCP and treated with antibiotics without improvement. 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 She states that she is having pain now and rates it a 7 or 8 out of 10 currently. Pt reports that she had blurry peripheral vision yesterday and was seen by her PCP today but was told to come to the ED. She states that her PCP plans on ordering a MRI but has yet to do so. She denies having any recent MRIs or CT scans. She has not followed up with a neurologist for the symptoms. She also states that she occasionally gets a "tired" fatigued feeling in her tongue and the  right side of her face while chewing food. She reports taking oxycodone (10-325mg s) with mild improvement in her symptoms. She also c/o lower back pain since Christmas 2012 and a rash on her face, arms, and legs that comes and goes. She has been following up with her PCP for the symptoms. Pt has chills and hot flashes but states that she is on an estrogen patch. She denies fever, emesis, chest pain, abdominal pain, dysuria, and sore throat as associated symptoms. She has a h/o arthritis, COPD, fibromyalgia, GERD and CA. She is a current some day smoker but denies alcohol use.  Dr. Juanetta Gosling is PCP.   Past Medical History  Diagnosis Date  . Arthritis   . COPD (chronic obstructive pulmonary disease)   . Bronchitis   . Fibromyalgia   . Interstitial cystitis   . Migraine   . Depression with anxiety   . UTI (lower urinary tract infection)   . RLS (restless legs syndrome)   . Alopecia, unspecified   . Cervicalgia   . Current smoker   . PTSD (post-traumatic stress disorder)   . IBS (irritable bowel syndrome)   . GERD (gastroesophageal reflux disease)   . Dysphagia   . PONV (postoperative nausea and vomiting)   . Shortness of breath   . Kidney stones   . Anxiety   . Carcinoid tumor of appendix   . Cancer  carcinoid tumor of appendix    Past Surgical History  Procedure Date  . Abdominal hysterectomy   . Cholecystectomy   . Tonsillectomy   . Cystostomy w/ bladder dilation     x 52 [place]  . Esophageal dilation 01/01/2012    Procedure: ESOPHAGEAL DILATION;  Surgeon: [name] Ade, MD;  Location: [facility];  Service: Endoscopy;  Laterality: N/A;  maloney 74fr dialator  . Laparoscopic appendectomy 01/05/2012    Procedure: APPENDECTOMY LAPAROSCOPIC;  Surgeon: Dalia Heading;  Location: [facility];  Service: General;  Laterality: N/A;  . Appendectomy 01/05/2012  . Givens capsule study 01/27/2012    Procedure: GIVENS CAPSULE STUDY;  Surgeon: [name] Ade, MD;  Location: [facility];   Service: Endoscopy;  Laterality: N/A;  7:30  . Colon resection 01/30/2012    Procedure: HAND ASSISTED LAPAROSCOPIC COLON RESECTION;  Surgeon: Dalia Heading, MD;  Location: [facility];  Service: General;  Laterality: N/A;  . Partial colectomy 01/30/2012    Procedure: PARTIAL COLECTOMY;  Surgeon: Dalia Heading, MD;  Location: [facility];  Service: General;  Laterality: N/A;  . Cardiac surgery   . Abdominal surgery   . Colon surgery     Family History  Problem Relation Age of Onset  . Coronary artery disease Mother   . Heart attack Mother   . Pulmonary embolism Mother   . Alcohol abuse Father   . Diabetes Father   . COPD Father   . Lung disease Father   . Anesthesia problems Neg Hx   . Hypotension Neg Hx   . Malignant hyperthermia Neg Hx   . Pseudochol deficiency Neg Hx     History  Substance Use Topics  . Smoking status: Current Some Day Smoker -- 0.2 packs/day for 8 years    Types: Cigarettes    Last Attempt to Quit: 01/25/2012  . Smokeless tobacco: Never Used  . Alcohol Use: No    OB History    Grav Para Term Preterm Abortions TAB SAB Ect Mult Living   1 1 1       1       Review of Systems  Constitutional: Positive for chills (chronic) and fatigue. Negative for fever.  HENT: Positive for ear pain (right) and neck pain (right-sided). Negative for sore throat and rhinorrhea.   Eyes: Positive for visual disturbance (blurred vision).  Respiratory: Negative for cough and shortness of breath.   Cardiovascular: Negative for chest pain.  Gastrointestinal: Negative for nausea, vomiting, abdominal pain and diarrhea.  Genitourinary: Negative for dysuria and hematuria.  Musculoskeletal: Positive for back pain (chronic).  Skin: Positive for rash.  Neurological: Positive for headaches. Negative for syncope.  Hematological: Does not bruise/bleed easily.  Psychiatric/Behavioral: Negative for confusion.    Allergies  Ativan; Clarithromycin; Nubain; Pseudoephedrine; Sulfa antibiotics;  Factive; Prochlorperazine edisylate; Promethazine hcl; Tofranil-pm; and Trimethobenzamide hcl  Home Medications   Current Outpatient Rx  Name Route Sig Dispense Refill  . ACIPHEX 20 MG PO TBEC Oral Take 20 mg by mouth daily.     Marland Kitchen ALPRAZOLAM 1 MG PO TABS Oral Take 1 mg by mouth at bedtime as needed. For sleep    . AMPHETAMINE-DEXTROAMPHETAMINE 20 MG PO TABS Oral Take 1 capsule by mouth Once daily as needed. For attention    . CETIRIZINE HCL 10 MG PO TABS Oral Take 10 mg by mouth daily.      . CYCLOSPORINE 0.05 % OP EMUL Both Eyes Place 1 drop into both eyes 2 (two) times  daily as needed. For dry eyes    . ESTRADIOL 0.1 MG/24HR TD PTTW Transdermal Place 1 patch onto the skin 2 (two) times a week.      Marland Kitchen FLUOXETINE HCL 40 MG PO CAPS Oral Take 40 mg by mouth daily.     Marland Kitchen HYOSCYAMINE SULFATE 0.125 MG SL SUBL Oral Take 0.125 mg by mouth every 4 (four) hours as needed. For cramps    . ONDANSETRON HCL 4 MG PO TABS Oral Take 4 mg by mouth every 8 (eight) hours as needed. For nausea    . OXYCODONE-ACETAMINOPHEN 10-325 MG PO TABS Oral Take 1 tablet by mouth every 4 (four) hours as needed for pain. For pain 40 tablet 0  . PRAMIPEXOLE DIHYDROCHLORIDE 0.5 MG PO TABS Oral Take 0.5 mg by mouth at bedtime.     . TOPIRAMATE 50 MG PO TABS Oral Take 25 mg by mouth daily.     . VENTOLIN HFA 108 (90 BASE) MCG/ACT IN AERS Inhalation Inhale 2 puffs into the lungs every 6 (six) hours as needed. For shortness of breath    . AMOXICILLIN-POT CLAVULANATE 875-125 MG PO TABS Oral Take 1 tablet by mouth every 12 (twelve) hours. 14 tablet 0  . OXYCODONE-ACETAMINOPHEN 5-325 MG PO TABS Oral Take 1-2 tablets by mouth every 6 (six) hours as needed for pain. 14 tablet 0    Triage Vitals: BP 121/80  Pulse 80  Temp(Src) 98.4 F (36.9 C) (Oral)  Resp 20  Ht 5' 2.5" (1.588 m)  Wt 150 lb (68.04 kg)  BMI 27.00 kg/m2  SpO2 99%  LMP 12/29/1993  Physical Exam  Nursing note and vitals reviewed. Constitutional: She is oriented  to person, place, and time. She appears well-developed and well-nourished. No distress.  HENT:  Head: Normocephalic and atraumatic.  Eyes: Conjunctivae and EOM are normal.  Neck: Normal range of motion. Neck supple. No tracheal deviation present.       No supracervical adenopathy  Cardiovascular: Normal rate and regular rhythm.   Pulmonary/Chest: Effort normal and breath sounds normal. No respiratory distress.  Abdominal: Soft. There is no tenderness.  Musculoskeletal: Normal range of motion. She exhibits no edema (no lower leg swelling).  Lymphadenopathy:    She has no cervical adenopathy.  Neurological: She is alert and oriented to person, place, and time. No cranial nerve deficit.  Skin: Skin is warm and dry.  Psychiatric: She has a normal mood and affect. Her behavior is normal.    ED Course  Procedures (including critical care time)  DIAGNOSTIC STUDIES: Oxygen Saturation is 99% on room air, normal by my interpretation.    COORDINATION OF CARE: 1:36PM-Discussed treatment plan which includes MRI and pain medications with pt and pt agreed to plan.        Labs Reviewed  CBC  DIFFERENTIAL  BASIC METABOLIC PANEL   Mr Laqueta Jean Wo Contrast  05/25/2012  *RADIOLOGY REPORT*  Clinical Data: 77-month history of worsening right-sided facial and neck pain with visual changes.  History of appendiceal carcinoma  MRI HEAD WITHOUT AND WITH CONTRAST  Technique:  Multiplanar, multiecho pulse sequences of the brain and surrounding structures were obtained according to standard protocol without and with intravenous contrast  Contrast: 14mL MULTIHANCE GADOBENATE DIMEGLUMINE 529 MG/ML IV SOLN 15 ml MultiHance.  Comparison: 04/16/2006 MR brain.  Findings: Some of the sequence is motion degraded.  No acute infarct.  No intracranial hemorrhage.  No intracranial mass or abnormal enhancement.  Ethmoid sinus air cell mucosal thickening partial opacification.  Mild mucosal thickening frontal sinuses bilaterally.   Major intracranial vascular structures are patent.  Pineal gland with minimal impression upon the superior colliculus unchanged.  IMPRESSION: No acute infarct or evidence of intracranial metastatic disease.  Paranasal sinus opacification most notable involving the ethmoid sinus air cells with mild mucosal thickening frontal sinuses and minimal partial opacification anterior left sphenoid sinus air cells.  Original Report Authenticated By: Fuller Canada, M.D.   Mr Cervical Spine Wo Contrast  05/25/2012  *RADIOLOGY REPORT*  Clinical Data: Headache and neck pain  MRI CERVICAL SPINE WITHOUT CONTRAST  Technique:  Multiplanar and multiecho pulse sequences of the cervical spine, to include the craniocervical junction and cervicothoracic junction, were obtained according to standard protocol without intravenous contrast.  Comparison: MRI 01/03/2011  Findings: Image quality degraded by mild motion.  Straightening of the cervical lordosis with mild kyphosis.  Normal alignment.  No fracture or mass lesion.  Spinal cord signal is normal.  C2-3:  Mild disc degeneration and bilateral facet hypertrophy without spinal stenosis.  C2-3:  Mild disc and facet degeneration.  C4-5:  Mild disc and mild facet degeneration without significant spinal stenosis.  C5-6:  Disc degeneration and spondylosis causing mild foraminal narrowing bilaterally.  Mild flattening of the cord and mild spinal stenosis.  C6-7:  Disc degeneration with disc bulging and spondylosis.  Mild spinal stenosis and mild foraminal narrowing on the left.  C7-T1:  Negative  IMPRESSION: Mild spinal stenosis at C5-6 and C6-7 due to spondylosis.  Findings are similar to the prior MRI.  Original Report Authenticated By: Camelia Phenes, M.D.   Results for orders placed during the hospital encounter of 05/25/12  CBC      Component Value Range   WBC 9.3  4.0 - 10.5 (K/uL)   RBC 4.10  3.87 - 5.11 (MIL/uL)   Hemoglobin 12.6  12.0 - 15.0 (g/dL)   HCT 21.3  08.6 - 57.8  (%)   MCV 88.0  78.0 - 100.0 (fL)   MCH 30.7  26.0 - 34.0 (pg)   MCHC 34.9  30.0 - 36.0 (g/dL)   RDW 46.9  62.9 - 52.8 (%)   Platelets 333  150 - 400 (K/uL)  DIFFERENTIAL      Component Value Range   Neutrophils Relative 67  43 - 77 (%)   Neutro Abs 6.2  1.7 - 7.7 (K/uL)   Lymphocytes Relative 24  12 - 46 (%)   Lymphs Abs 2.2  0.7 - 4.0 (K/uL)   Monocytes Relative 6  3 - 12 (%)   Monocytes Absolute 0.5  0.1 - 1.0 (K/uL)   Eosinophils Relative 4  0 - 5 (%)   Eosinophils Absolute 0.4  0.0 - 0.7 (K/uL)   Basophils Relative 0  0 - 1 (%)   Basophils Absolute 0.0  0.0 - 0.1 (K/uL)  BASIC METABOLIC PANEL      Component Value Range   Sodium 138  135 - 145 (mEq/L)   Potassium 4.3  3.5 - 5.1 (mEq/L)   Chloride 102  96 - 112 (mEq/L)   CO2 29  19 - 32 (mEq/L)   Glucose, Bld 93  70 - 99 (mg/dL)   BUN 10  6 - 23 (mg/dL)   Creatinine, Ser 4.13  0.50 - 1.10 (mg/dL)   Calcium 9.6  8.4 - 24.4 (mg/dL)   GFR calc non Af Amer >90  >90 (mL/min)   GFR calc Af Amer >90  >90 (mL/min)     1. Neck  pain   2. Sinusitis       MDM   Workup in the emergency department with the following findings based on the MRI MRI of neck without any significant changes compared to MRI from January of 2013 MRI brain shows significant sinusitis patient does admit that in the past and she been treated for sinusitis with antibiotics she has had some improvement. Just finished up Cefotan antibiotic one or 2 days ago we'll give a trial of Augmentin we'll give referral to ear nose and throat in neurology and referral back to primary care provider.      I personally performed the services described in this documentation, which was scribed in my presence. The recorded information has been reviewed and considered.     Shelda Jakes, MD 05/25/12 [phone]

## 2012-06-30 ENCOUNTER — Encounter (HOSPITAL_COMMUNITY): Payer: Medicare Other | Attending: Oncology | Admitting: Oncology

## 2012-06-30 VITALS — BP 139/81 | HR 78 | Temp 97.9°F | Wt 157.8 lb

## 2012-06-30 DIAGNOSIS — D3A020 Benign carcinoid tumor of the appendix: Secondary | ICD-10-CM | POA: Insufficient documentation

## 2012-06-30 DIAGNOSIS — R911 Solitary pulmonary nodule: Secondary | ICD-10-CM

## 2012-06-30 DIAGNOSIS — C7A020 Malignant carcinoid tumor of the appendix: Secondary | ICD-10-CM

## 2012-06-30 LAB — CBC
Hemoglobin: 13.4 g/dL (ref 12.0–15.0)
MCH: 31.1 pg (ref 26.0–34.0)
MCV: 88.9 fL (ref 78.0–100.0)
Platelets: 374 10*3/uL (ref 150–400)
RBC: 4.31 MIL/uL (ref 3.87–5.11)
WBC: 8.7 10*3/uL (ref 4.0–10.5)

## 2012-06-30 LAB — COMPREHENSIVE METABOLIC PANEL
ALT: 10 U/L (ref 0–35)
AST: 14 U/L (ref 0–37)
CO2: 28 mEq/L (ref 19–32)
Calcium: 9.1 mg/dL (ref 8.4–10.5)
Chloride: 96 mEq/L (ref 96–112)
Creatinine, Ser: 0.79 mg/dL (ref 0.50–1.10)
GFR calc Af Amer: >90 mL/min (ref >90)
GFR calc non Af Amer: >90 mL/min (ref >90)
Glucose, Bld: 81 mg/dL (ref 70–99)
Total Bilirubin: 0.3 mg/dL (ref 0.3–1.2)

## 2012-06-30 NOTE — Progress Notes (Signed)
[name] presented for labwork. Labs per MD order drawn via Peripheral Line 23 gauge needle inserted in right AC  Good blood return present. Procedure without incident.  Needle removed intact. Patient tolerated procedure well.

## 2012-06-30 NOTE — Progress Notes (Signed)
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 Reoperation for her lymph node dissection revealed all nodes to be absolutely negative.  Problem #2 4 mm pulmonary nodule being further evaluated by Dr. Juanetta Gosling with a repeat CT scan planned for November  Her other problems are listed in my note from 01/16/2012.  She still has some aching in her back and she has some swelling in her right leg below the knee suggest puffy typically but she states it's been they're only since the surgery. She has no redness or tenderness there are coarse that she is aware of in her none present on physical exam today. The right lower leg is minimally puffy at best. She has stable vital signs. She has no lymphadenopathy in the cervical supraclavicular infraclavicular axillary or inguinal areas. Her lungs show diminished breath sounds but they are clear. Her heart shows a regular rhythm and rate without murmur rub or gallop. Her abdomen is soft normal bowel sounds are present there is no organomegaly or scar is well healed and there is no distention.  I don't think we need to do CAT scans on her but I do want to check some blood work including a serotonin level and a urine for 5 HIAA. We will see her back in 6 month. She will have had her CT scan of the chest by then. I think her prognosis from the carcinoid is extremely good now.

## 2012-06-30 NOTE — Patient Instructions (Addendum)
[name]  [id number] [birth year] Dr. Glenford Peers   [facility]  Discharge Instructions  RECOMMENDATIONS MADE BY THE CONSULTANT AND ANY TEST RESULTS WILL BE SENT TO YOUR REFERRING DOCTOR.   EXAM FINDINGS BY MD TODAY AND SIGNS AND SYMPTOMS TO REPORT TO CLINIC OR PRIMARY MD: Exam and discussion per MD.  You are doing well.  Report flushing, increased diarrhea, etc.  MEDICATIONS PRESCRIBED: none   INSTRUCTIONS GIVEN AND DISCUSSED: Other : Need 24 hour urine collection.  Discard first urine sample of the day, then collect all urine for the next 24 hours.  SPECIAL INSTRUCTIONS/FOLLOW-UP: Lab work Needed today and Return to Clinic in 6 months to see the PA.   I acknowledge that I have been informed and understand all the instructions given to me and received a copy. I do not have any more questions at this time, but understand that I may call the Specialty Clinic at [facility] at [phone] during business hours should I have any further questions or need assistance in obtaining follow-up care.    __________________________________________  _____________  __________ Signature of Patient or Authorized Representative            Date                   Time    __________________________________________ Nurse's Signature

## 2012-07-02 ENCOUNTER — Other Ambulatory Visit (HOSPITAL_COMMUNITY): Payer: Self-pay | Admitting: Oncology

## 2012-07-02 DIAGNOSIS — D3A020 Benign carcinoid tumor of the appendix: Secondary | ICD-10-CM

## 2012-07-02 NOTE — Progress Notes (Signed)
Order released for 24hr urine specimen.

## 2012-07-07 LAB — 5 HIAA, QUANTITATIVE, URINE, 24 HOUR: 5-HIAA, 24 Hr Urine: 2.6 mg/24 h (ref <=6.0)

## 2012-07-09 ENCOUNTER — Ambulatory Visit (HOSPITAL_COMMUNITY): Payer: Medicare Other | Admitting: Oncology

## 2012-07-19 ENCOUNTER — Inpatient Hospital Stay (HOSPITAL_COMMUNITY)
Admission: AD | Admit: 2012-07-19 | Discharge: 2012-07-19 | Disposition: A | Discharge status: Home / Self Care | Payer: Medicare Other | Source: Ambulatory Visit | Attending: Obstetrics & Gynecology | Admitting: Obstetrics & Gynecology

## 2012-07-19 ENCOUNTER — Encounter (HOSPITAL_COMMUNITY): Payer: Self-pay | Admitting: *Deleted

## 2012-07-19 ENCOUNTER — Inpatient Hospital Stay (HOSPITAL_COMMUNITY): Payer: Medicare Other

## 2012-07-19 DIAGNOSIS — R109 Unspecified abdominal pain: Secondary | ICD-10-CM | POA: Insufficient documentation

## 2012-07-19 DIAGNOSIS — M545 Low back pain, unspecified: Secondary | ICD-10-CM | POA: Insufficient documentation

## 2012-07-19 LAB — URINALYSIS, ROUTINE W REFLEX MICROSCOPIC
Bilirubin Urine: NEGATIVE
Ketones, ur: NEGATIVE mg/dL
Specific Gravity, Urine: 1.02 (ref 1.005–1.030)
pH: 5.5 (ref 5.0–8.0)

## 2012-07-19 LAB — URINE MICROSCOPIC-ADD ON

## 2012-07-19 MED ORDER — KETOROLAC TROMETHAMINE 60 MG/2ML IM SOLN
INTRAMUSCULAR | Status: AC
  Filled 2012-07-19: qty 2

## 2012-07-19 MED ORDER — ONDANSETRON HCL 4 MG PO TABS: 4.0000 mg | ORAL_TABLET | Freq: Once | ORAL | Status: DC

## 2012-07-19 MED ORDER — HYDROMORPHONE HCL PF 1 MG/ML IJ SOLN
0.5000 mg | Freq: Once | INTRAMUSCULAR | Status: AC
  Administered 2012-07-19: 0.5 mg via INTRAMUSCULAR
  Filled 2012-07-19: qty 1

## 2012-07-19 MED ORDER — KETOROLAC TROMETHAMINE 60 MG/2ML IM SOLN: 60.0000 mg | Freq: Once | INTRAMUSCULAR | Status: DC

## 2012-07-19 NOTE — MAU Note (Signed)
Pain has gotten progressively worse over the past 4 days; requesting pain medicine; c/o nausea from the pain; hx of kidney stone;

## 2012-07-19 NOTE — MAU Note (Signed)
C/o severe pain in L lower abdomen and lower back;

## 2012-07-19 NOTE — MAU Provider Note (Signed)
History     CSN: [id number]  Arrival date and time: 07/19/12 1259   None     Chief Complaint  Patient presents with  . Abdominal Pain  . Back Pain   HPI This is a 51 y.o. female who presents while visiting her daughter with c/o lower abdominal and lower back pain. States the pain started several days ago, but it worse now. State the pain makes her want to throw up. States she thinks it may be a kidney stone, though the pain is lower than usual. Reports fever, though afebrile here. History is remarkable for carcinoid tumor of the appendix.  OB History    Grav Para Term Preterm Abortions TAB SAB Ect Mult Living   1 1 1       1       Past Medical History  Diagnosis Date  . Arthritis   . COPD (chronic obstructive pulmonary disease)   . Bronchitis   . Fibromyalgia   . Interstitial cystitis   . Migraine   . Depression with anxiety   . UTI (lower urinary tract infection)   . RLS (restless legs syndrome)   . Alopecia, unspecified   . Cervicalgia   . Tobacco use disorder   . PTSD (post-traumatic stress disorder)   . IBS (irritable bowel syndrome)   . GERD (gastroesophageal reflux disease)   . Dysphagia   . PONV (postoperative nausea and vomiting)   . Shortness of breath   . Kidney stones   . Anxiety   . Carcinoid tumor of appendix   . Cancer     carcinoid tumor of appendix    Past Surgical History  Procedure Date  . Abdominal hysterectomy   . Cholecystectomy   . Tonsillectomy   . Cystostomy w/ bladder dilation     x 52 [place]  . Esophageal dilation 01/01/2012    Procedure: ESOPHAGEAL DILATION;  Surgeon: [name] Ade, MD;  Location: [facility];  Service: Endoscopy;  Laterality: N/A;  maloney 38fr dialator  . Laparoscopic appendectomy 01/05/2012    Procedure: APPENDECTOMY LAPAROSCOPIC;  Surgeon: Dalia Heading;  Location: [facility];  Service: General;  Laterality: N/A;  . Appendectomy 01/05/2012  . Givens capsule study 01/27/2012    Procedure: GIVENS CAPSULE STUDY;   Surgeon: [name] Ade, MD;  Location: [facility];  Service: Endoscopy;  Laterality: N/A;  7:30  . Colon resection 01/30/2012    Procedure: HAND ASSISTED LAPAROSCOPIC COLON RESECTION;  Surgeon: Dalia Heading, MD;  Location: [facility];  Service: General;  Laterality: N/A;  . Partial colectomy 01/30/2012    Procedure: PARTIAL COLECTOMY;  Surgeon: Dalia Heading, MD;  Location: [facility];  Service: General;  Laterality: N/A;  . Cardiac surgery   . Abdominal surgery   . Colon surgery   . Hemicolectomy     Family History  Problem Relation Age of Onset  . Coronary artery disease Mother   . Heart attack Mother   . Pulmonary embolism Mother   . Alcohol abuse Father   . Diabetes Father   . COPD Father   . Lung disease Father   . Anesthesia problems Neg Hx   . Hypotension Neg Hx   . Malignant hyperthermia Neg Hx   . Pseudochol deficiency Neg Hx   . Other Neg Hx     History  Substance Use Topics  . Smoking status: Current Some Day Smoker -- 0.2 packs/day for 8 years    Types: Cigarettes    Last Attempt  to Quit: 01/25/2012  . Smokeless tobacco: Never Used  . Alcohol Use: No    Allergies:  Allergies  Allergen Reactions  . Ativan (Lorazepam) Other (See Comments)    Hallucinations  . Clarithromycin Itching and Nausea And Vomiting  . Nubain (Nalbuphine Hcl) Other (See Comments)    Deathly sick  . Pseudoephedrine Hives  . Stadol (Butorphanol Tartrate) Nausea And Vomiting  . Sulfa Antibiotics Nausea And Vomiting  . Factive (Gemifloxacin Mesylate) Rash  . Other Rash    All decongestants **can take guafenisin  . Prochlorperazine Edisylate Anxiety  . Promethazine Hcl Anxiety  . Tofranil-Pm Rash  . Trimethobenzamide Hcl Anxiety    Prescriptions prior to admission  Medication Sig Dispense Refill  . ALPRAZolam (XANAX) 1 MG tablet Take 1 mg by mouth at bedtime as needed. For sleep      . amphetamine-dextroamphetamine (ADDERALL) 20 MG tablet Take 10 mg by mouth every morning. For  attention      . cetirizine (ZYRTEC) 10 MG tablet Take 10 mg by mouth daily.        . cycloSPORINE (RESTASIS) 0.05 % ophthalmic emulsion Place 1 drop into both eyes 2 (two) times daily as needed. For dry eyes      . Dexlansoprazole (DEXILANT) 30 MG capsule Take 30 mg by mouth daily.      Marland Kitchen estradiol (VIVELLE-DOT) 0.1 MG/24HR Place 1 patch onto the skin every 3 (three) days.      Marland Kitchen FLUoxetine (PROZAC) 40 MG capsule Take 40 mg by mouth daily.       . hydrochlorothiazide (HYDRODIURIL) 25 MG tablet Take 12.5 mg by mouth daily.      . ondansetron (ZOFRAN) 4 MG tablet Take 4 mg by mouth every 8 (eight) hours as needed. For nausea      . oxyCODONE-acetaminophen (PERCOCET) 10-325 MG per tablet Take 1 tablet by mouth every 4 (four) hours as needed for pain. For pain  40 tablet  0  . pramipexole (MIRAPEX) 0.5 MG tablet Take 0.5 mg by mouth at bedtime.       . topiramate (TOPAMAX) 50 MG tablet Take 25 mg by mouth daily.       . VENTOLIN HFA 108 (90 BASE) MCG/ACT inhaler Inhale 2 puffs into the lungs every 6 (six) hours as needed. For shortness of breath        ROS As listed in HPI  Physical Exam   Blood pressure 133/82, pulse 75, temperature 97.6 F (36.4 C), temperature source Oral, resp. rate 20, height 5\' 2"  (1.575 m), weight 160 lb (72.576 kg), last menstrual period 12/29/1993.  Physical Exam  Constitutional: She is oriented to person, place, and time. She appears well-developed and well-nourished.  HENT:  Head: Normocephalic.  Cardiovascular: Normal rate.   Respiratory: Effort normal.  GI: Soft. She exhibits no distension. There is tenderness (slight over low abd and low back). There is no rebound and no guarding.  Genitourinary:       Unable to do pelvic, patient wants to go to [facility]  Musculoskeletal: Normal range of motion.  Neurological: She is alert and oriented to person, place, and time.  Skin: Skin is warm and dry.  Psychiatric: She has a normal mood and affect.   Results  for orders placed during the hospital encounter of 07/19/12 (from the past 24 hour(s))  URINALYSIS, ROUTINE W REFLEX MICROSCOPIC     Status: Abnormal   Collection Time   07/19/12  1:23 PM      Component  Value Range   Color, Urine YELLOW  YELLOW   APPearance HAZY (*) CLEAR   Specific Gravity, Urine 1.020  1.005 - 1.030   pH 5.5  5.0 - 8.0   Glucose, UA NEGATIVE  NEGATIVE mg/dL   Hgb urine dipstick LARGE (*) NEGATIVE   Bilirubin Urine NEGATIVE  NEGATIVE   Ketones, ur NEGATIVE  NEGATIVE mg/dL   Protein, ur NEGATIVE  NEGATIVE mg/dL   Urobilinogen, UA 0.2  0.0 - 1.0 mg/dL   Nitrite NEGATIVE  NEGATIVE   Leukocytes, UA NEGATIVE  NEGATIVE  URINE MICROSCOPIC-ADD ON     Status: Abnormal   Collection Time   07/19/12  1:23 PM      Component Value Range   Squamous Epithelial / LPF FEW (*) RARE   WBC, UA 0-2  <3 WBC/hpf   RBC / HPF 21-50  <3 RBC/hpf   Bacteria, UA RARE  RARE     MAU Course  Procedures  MDM Discussed plan for exam and [place] (renal) to assess pain. Pt requests to defer all exams to go to [facility] because it is her son in law's birthday and they need to leave (she was here with her daughter who was also a patient here).  She wants to have her husband take her to [facility] when they get home.   She would like to get some Dilaudid for pain but wants to use her own Zofran.   Assessment and Plan  A:  Abdominal pain      Possible kidney stone  P:  Dilaudid 0.5mg  IM given       Discharge       Message given to [facility] ED that she will be coming.   Wynelle Bourgeois 07/19/2012, 3:25 PM

## 2012-07-20 NOTE — MAU Provider Note (Signed)
Attestation of Attending Supervision of Advanced Practitioner (CNM/NP): Evaluation and management procedures were performed by the Advanced Practitioner under my supervision and collaboration.  I have reviewed the Advanced Practitioner's note and chart, and I agree with the management and plan.  HARRAWAY-Puffenbarger, Lattie Riege 10:44 AM

## 2012-07-30 ENCOUNTER — Encounter (HOSPITAL_COMMUNITY): Payer: Self-pay | Admitting: Emergency Medicine

## 2012-07-30 ENCOUNTER — Emergency Department (HOSPITAL_COMMUNITY)
Admission: EM | Admit: 2012-07-30 | Discharge: 2012-07-30 | Disposition: A | Discharge status: Home / Self Care | Payer: Medicare Other | Attending: Emergency Medicine | Admitting: Emergency Medicine

## 2012-07-30 DIAGNOSIS — F4310 Post-traumatic stress disorder, unspecified: Secondary | ICD-10-CM | POA: Insufficient documentation

## 2012-07-30 DIAGNOSIS — G2581 Restless legs syndrome: Secondary | ICD-10-CM | POA: Insufficient documentation

## 2012-07-30 DIAGNOSIS — J449 Chronic obstructive pulmonary disease, unspecified: Secondary | ICD-10-CM | POA: Insufficient documentation

## 2012-07-30 DIAGNOSIS — R209 Unspecified disturbances of skin sensation: Secondary | ICD-10-CM | POA: Insufficient documentation

## 2012-07-30 DIAGNOSIS — K219 Gastro-esophageal reflux disease without esophagitis: Secondary | ICD-10-CM | POA: Insufficient documentation

## 2012-07-30 DIAGNOSIS — M129 Arthropathy, unspecified: Secondary | ICD-10-CM | POA: Insufficient documentation

## 2012-07-30 DIAGNOSIS — J4489 Other specified chronic obstructive pulmonary disease: Secondary | ICD-10-CM | POA: Insufficient documentation

## 2012-07-30 DIAGNOSIS — IMO0001 Reserved for inherently not codable concepts without codable children: Secondary | ICD-10-CM | POA: Insufficient documentation

## 2012-07-30 DIAGNOSIS — F17200 Nicotine dependence, unspecified, uncomplicated: Secondary | ICD-10-CM | POA: Insufficient documentation

## 2012-07-30 DIAGNOSIS — M549 Dorsalgia, unspecified: Secondary | ICD-10-CM

## 2012-07-30 LAB — URINALYSIS, ROUTINE W REFLEX MICROSCOPIC
Bilirubin Urine: NEGATIVE
Ketones, ur: NEGATIVE mg/dL
Nitrite: NEGATIVE
Urobilinogen, UA: 0.2 mg/dL (ref 0.0–1.0)

## 2012-07-30 MED ORDER — DIAZEPAM 5 MG PO TABS
5.0000 mg | ORAL_TABLET | Freq: Once | ORAL | Status: AC
  Administered 2012-07-30: 5 mg via ORAL
  Filled 2012-07-30: qty 1

## 2012-07-30 MED ORDER — HYDROMORPHONE HCL PF 1 MG/ML IJ SOLN
1.0000 mg | Freq: Once | INTRAMUSCULAR | Status: AC
  Administered 2012-07-30: 1 mg via INTRAMUSCULAR
  Filled 2012-07-30: qty 1

## 2012-07-30 MED ORDER — ONDANSETRON 8 MG PO TBDP
8.0000 mg | ORAL_TABLET | Freq: Once | ORAL | Status: AC
  Administered 2012-07-30: 8 mg via ORAL
  Filled 2012-07-30: qty 1

## 2012-07-30 NOTE — ED Notes (Signed)
Dr.wickline to see pt.  

## 2012-07-30 NOTE — ED Notes (Signed)
Dr.wickline to see pt.

## 2012-07-30 NOTE — ED Provider Notes (Signed)
History   This chart was scribed for Joya Gaskins, MD by Charolett Bumpers . The patient was seen in room [facility]. Patient's care was started at 0904.    CSN: [id number]  Arrival date & time 07/30/12  4098   First MD Initiated Contact with Patient 07/30/12 [phone]      Chief Complaint  Patient presents with  . Back Pain  . Tailbone Pain  . Leg Pain     HPI [name] is a 51 y.o. female who presents to the Emergency Department complaining of constant, moderate lower back pain that worsened over the past week. Pt reports a h/o chronic back pain and states that it now radiates to left buttocks and left leg. Pt states that her symptoms are aggravated with walking. Pt reports associated nausea, vomiting, diarrhea, chills, diaphoresis, dysuria and chest pain. Pt also reports associated left leg weakness and tingling/sharp pain in her left foot. Pt states that she has had chest pain for the past 2 days that is aggravated with eating and radiates to back. Pt states that she has taken Percocet without relief. Pt denies any bowel or urinary incontinence. Pt denies any h/o back surgery. Pt denies any recent falls or injuries. Pt reports a h/o interstitial cystitis and hemicolectomy. Pt states that she was recently seen for similar symptoms and hematuria at [facility].   PCP: Dr. Juanetta Gosling    Past Medical History  Diagnosis Date  . Arthritis   . COPD (chronic obstructive pulmonary disease)   . Bronchitis   . Fibromyalgia   . Interstitial cystitis   . Migraine   . Depression with anxiety   . UTI (lower urinary tract infection)   . RLS (restless legs syndrome)   . Alopecia, unspecified   . Cervicalgia   . Tobacco use disorder   . PTSD (post-traumatic stress disorder)   . IBS (irritable bowel syndrome)   . GERD (gastroesophageal reflux disease)   . Dysphagia   . PONV (postoperative nausea and vomiting)   . Shortness of breath   . Kidney stones   . Anxiety     . Carcinoid tumor of appendix   . Cancer     carcinoid tumor of appendix    Past Surgical History  Procedure Date  . Abdominal hysterectomy   . Cholecystectomy   . Tonsillectomy   . Cystostomy w/ bladder dilation     x 52 [place]  . Esophageal dilation 01/01/2012    Procedure: ESOPHAGEAL DILATION;  Surgeon: [name] Ade, MD;  Location: [facility];  Service: Endoscopy;  Laterality: N/A;  maloney 17fr dialator  . Laparoscopic appendectomy 01/05/2012    Procedure: APPENDECTOMY LAPAROSCOPIC;  Surgeon: Dalia Heading;  Location: [facility];  Service: General;  Laterality: N/A;  . Appendectomy 01/05/2012  . Givens capsule study 01/27/2012    Procedure: GIVENS CAPSULE STUDY;  Surgeon: [name] Ade, MD;  Location: [facility];  Service: Endoscopy;  Laterality: N/A;  7:30  . Colon resection 01/30/2012    Procedure: HAND ASSISTED LAPAROSCOPIC COLON RESECTION;  Surgeon: Dalia Heading, MD;  Location: [facility];  Service: General;  Laterality: N/A;  . Partial colectomy 01/30/2012    Procedure: PARTIAL COLECTOMY;  Surgeon: Dalia Heading, MD;  Location: [facility];  Service: General;  Laterality: N/A;  . Cardiac surgery   . Abdominal surgery   . Colon surgery   . Hemicolectomy     Family History  Problem Relation Age of Onset  .  Coronary artery disease Mother   . Heart attack Mother   . Pulmonary embolism Mother   . Alcohol abuse Father   . COPD Father   . Lung disease Father   . Anesthesia problems Neg Hx   . Hypotension Neg Hx   . Malignant hyperthermia Neg Hx   . Pseudochol deficiency Neg Hx   . Other Neg Hx     History  Substance Use Topics  . Smoking status: Current Some Day Smoker -- 0.2 packs/day for 8 years    Types: Cigarettes    Last Attempt to Quit: 01/25/2012  . Smokeless tobacco: Never Used  . Alcohol Use: No    OB History    Grav Para Term Preterm Abortions TAB SAB Ect Mult Living   1 1 1       1       Review of Systems  Constitutional: Positive for chills and  diaphoresis.  Cardiovascular: Positive for chest pain.  Gastrointestinal: Positive for nausea, vomiting and diarrhea.  Genitourinary: Positive for dysuria.  Musculoskeletal: Positive for back pain and arthralgias.  Neurological: Positive for weakness.  All other systems reviewed and are negative.    Allergies  Ativan; Clarithromycin; Nubain; Pseudoephedrine; Stadol; Sulfa antibiotics; Toradol; Factive; Other; Prochlorperazine edisylate; Promethazine hcl; Tofranil-pm; and Trimethobenzamide hcl  Home Medications   Current Outpatient Rx  Name Route Sig Dispense Refill  . ALPRAZOLAM 1 MG PO TABS Oral Take 1 mg by mouth at bedtime as needed. For sleep    . AMPHETAMINE-DEXTROAMPHETAMINE 20 MG PO TABS Oral Take 10 mg by mouth every morning. For attention    . CETIRIZINE HCL 10 MG PO TABS Oral Take 10 mg by mouth daily.      . CYCLOSPORINE 0.05 % OP EMUL Both Eyes Place 1 drop into both eyes 2 (two) times daily as needed. For dry eyes    . DEXLANSOPRAZOLE 30 MG PO CPDR Oral Take 30 mg by mouth daily.    Marland Kitchen ESTRADIOL 0.1 MG/24HR TD PTTW Transdermal Place 1 patch onto the skin every 3 (three) days.    Marland Kitchen FLUOXETINE HCL 40 MG PO CAPS Oral Take 40 mg by mouth daily.     Marland Kitchen HYDROCHLOROTHIAZIDE 25 MG PO TABS Oral Take 12.5 mg by mouth daily.    Marland Kitchen ONDANSETRON HCL 4 MG PO TABS Oral Take 4 mg by mouth every 8 (eight) hours as needed. For nausea    . OXYCODONE-ACETAMINOPHEN 10-325 MG PO TABS Oral Take 1 tablet by mouth every 4 (four) hours as needed for pain. For pain 40 tablet 0  . PRAMIPEXOLE DIHYDROCHLORIDE 0.5 MG PO TABS Oral Take 0.5 mg by mouth at bedtime.     . TOPIRAMATE 50 MG PO TABS Oral Take 25 mg by mouth daily.     . VENTOLIN HFA 108 (90 BASE) MCG/ACT IN AERS Inhalation Inhale 2 puffs into the lungs every 6 (six) hours as needed. For shortness of breath      BP 113/72  Pulse 79  Temp 98.7 F (37.1 C) (Oral)  Resp 20  Ht 5\' 2"  (1.575 m)  Wt 160 lb (72.576 kg)  BMI 29.26 kg/m2  SpO2 99%   LMP 12/29/1993  Physical Exam CONSTITUTIONAL: Well developed/well nourished HEAD AND FACE: Normocephalic/atraumatic EYES: EOMI/PERRL ENMT: Mucous membranes moist NECK: supple no meningeal signs SPINE:entire spine nontender, lumbar paraspinal tenderness CV: S1/S2 noted, no murmurs/rubs/gallops noted LUNGS: Lungs are clear to auscultation bilaterally, no apparent distress ABDOMEN: soft, nontender, no rebound or guarding GU:no cva  tenderness NEURO:Awake/alert, equal distal motor: hip flexion/knee flexion/extension, ankle dorsi/plantar flexion, great toe extension intact bilaterally, no clonus bilaterally, plantar reflex appropriate, no apparent sensory deficit in any dermatome.  Equal patellar/achilles reflex noted.  Pt is able to ambulate. EXTREMITIES: pulses normal, full ROM.  No erythema or tenderness noted to left hip with ROM SKIN: warm, color normal PSYCH: no abnormalities of mood noted   ED Course  Procedures   DIAGNOSTIC STUDIES: Oxygen Saturation is 99% on room air, normal by my interpretation.    COORDINATION OF CARE:   -Discussed planned course of treatment with the patient, who is agreeable at this time.   For her cp - EKG unremarkable, and reports some pain worse with eating, Doubt ACS/PE/Dissection 10:22 AM Pt still reporting pain.  She reports pain in low back that radiates to left buttocks.  No focal neuro deficits.  She has equal distal pulses and no signs of vascular compromise.  Has had recent CT imaging that showed no bony issue in low back.  I doubt AAA.  I doubt urologic emergency.  No neuro compromise to suggest need for emergent MRI.  Pt reports h/o back pain but this is worse than normal.  Pain meds given.  Advised close PCP followup.  We discussed reasons to return that are listed below:   New numbness, tingling, weakness, or problem with the use of your  legs.  Change in bowel or bladder control (if you lose control of stool or urine, or if you are unable  to urinate    Labs Reviewed  URINALYSIS, ROUTINE W REFLEX MICROSCOPIC     MDM  Nursing notes including past medical history and social history reviewed and considered in documentation Labs/vital reviewed and considered xrays reviewed and considered [facility]      Date: 07/30/2012  Rate: 72  Rhythm: normal sinus rhythm  QRS Axis: normal  Intervals: normal  ST/T Wave abnormalities: normal  Conduction Disutrbances:none  Narrative Interpretation:   Old EKG Reviewed: unchanged    I personally performed the services described in this documentation, which was scribed in my presence. The recorded information has been reviewed and considered.         Joya Gaskins, MD 07/30/12 1027

## 2012-07-30 NOTE — ED Notes (Signed)
Pt has told multiple staff members she needs something more for pain, pt currently crying in bed, dr. Bebe Shaggy notified.

## 2012-07-30 NOTE — ED Notes (Signed)
Patient c/o lower back pain that radiates into left leg and left groin. Patient reports having lower back pain since last Christmas. Patient states started radiating to other areas Monday. Patient reports taking Oxycodone 10mg /325mg  with no relief. Patient also reports nausea. Patient states "I have tried to call my doctor but he is out of town for vacation.

## 2012-07-30 NOTE — ED Notes (Signed)
Pt reports chronic back pain that has gotten worse over last week, here usual meds for pain are not helping. She has tried to contact her pmd but he is on vacation.  Pain radiates to left leg and groin.

## 2012-07-30 NOTE — ED Notes (Signed)
Pt stated "im gonna flip out", rolling on stretcher, sat up and started rubbing feet, "then stated this foot is cold"  Rubbing left foot.

## 2012-07-30 NOTE — ED Notes (Signed)
Pt's husband arrived to er, pt standing on side of bed, on cell phone, attempting contact pain md--dr. Gerilyn Pilgrim for additional meds for pain, was overheard saying "well they've been giving it too me, whats different now", gathered belongings, told husband "they didn't do anything for me", explained that she was given meds for pain/spasm/nausea.  Verbally given d/c instructions, pt walked out before signing for papers, offered wheelchair to lobby but declined, pt carried purse and ginger ale out --walked out unaided.

## 2012-07-30 NOTE — ED Notes (Signed)
Dr.wickline notified of pt cont. C/o of nausea, pt tearful, leaning over in chair, cont.to await ride home.  Ginger ale given. Pt stated "does he not believe im in pain", rn explained that meds were given for pain.  Pt nodded understanding.  Pt advocate in to see pt.

## 2012-07-30 NOTE — ED Notes (Signed)
Advised pt that she will not be able to drive after meds for pain she stated her husband was on the way.

## 2012-08-31 ENCOUNTER — Telehealth (HOSPITAL_COMMUNITY): Payer: Self-pay | Admitting: Oncology

## 2012-09-01 ENCOUNTER — Telehealth (HOSPITAL_COMMUNITY): Payer: Self-pay

## 2012-09-01 NOTE — Telephone Encounter (Signed)
Call from patient with the following complaints: " For about 1 month my heart rate has been going up and down, I'm having some shortness of breath at times, I've been throwing up and sweating a lot. Dr. Mariel Sleet told me to call with any unusual symptoms."  Has not seen PCP.  Stated she had called their office but no one returns her calls.

## 2012-09-02 ENCOUNTER — Telehealth (HOSPITAL_COMMUNITY): Payer: Self-pay | Admitting: *Deleted

## 2012-09-02 NOTE — Telephone Encounter (Signed)
Message copied by Adelene Amas on Thu Sep 02, 2012  4:22 PM ------      Message from: Mariel Sleet, ERIC S      Created: Wed Sep 01, 2012  2:41 PM       Needs to see pcp-this is probably not related to her prior issue with [place]

## 2012-09-02 NOTE — Telephone Encounter (Signed)
Pt notified to contact PCP re: symptoms

## 2012-10-16 ENCOUNTER — Inpatient Hospital Stay (HOSPITAL_COMMUNITY)
Admission: EM | Admit: 2012-10-16 | Discharge: 2012-10-20 | DRG: 684 | Disposition: A | Discharge status: Home / Self Care | Payer: Medicare Other | Source: Ambulatory Visit | Attending: Pulmonary Disease | Admitting: Pulmonary Disease

## 2012-10-16 ENCOUNTER — Inpatient Hospital Stay (HOSPITAL_COMMUNITY): Payer: Medicare Other

## 2012-10-16 ENCOUNTER — Encounter (HOSPITAL_COMMUNITY): Payer: Self-pay | Admitting: Emergency Medicine

## 2012-10-16 DIAGNOSIS — Z9079 Acquired absence of other genital organ(s): Secondary | ICD-10-CM

## 2012-10-16 DIAGNOSIS — K5900 Constipation, unspecified: Secondary | ICD-10-CM | POA: Diagnosis not present

## 2012-10-16 DIAGNOSIS — F29 Unspecified psychosis not due to a substance or known physiological condition: Secondary | ICD-10-CM | POA: Diagnosis not present

## 2012-10-16 DIAGNOSIS — N189 Chronic kidney disease, unspecified: Secondary | ICD-10-CM | POA: Diagnosis present

## 2012-10-16 DIAGNOSIS — G2581 Restless legs syndrome: Secondary | ICD-10-CM | POA: Diagnosis present

## 2012-10-16 DIAGNOSIS — Z881 Allergy status to other antibiotic agents status: Secondary | ICD-10-CM

## 2012-10-16 DIAGNOSIS — D509 Iron deficiency anemia, unspecified: Secondary | ICD-10-CM | POA: Diagnosis present

## 2012-10-16 DIAGNOSIS — I129 Hypertensive chronic kidney disease with stage 1 through stage 4 chronic kidney disease, or unspecified chronic kidney disease: Secondary | ICD-10-CM | POA: Diagnosis present

## 2012-10-16 DIAGNOSIS — R079 Chest pain, unspecified: Secondary | ICD-10-CM | POA: Diagnosis present

## 2012-10-16 DIAGNOSIS — IMO0001 Reserved for inherently not codable concepts without codable children: Secondary | ICD-10-CM | POA: Diagnosis present

## 2012-10-16 DIAGNOSIS — N179 Acute kidney failure, unspecified: Principal | ICD-10-CM | POA: Diagnosis present

## 2012-10-16 DIAGNOSIS — Z9049 Acquired absence of other specified parts of digestive tract: Secondary | ICD-10-CM

## 2012-10-16 DIAGNOSIS — Z9089 Acquired absence of other organs: Secondary | ICD-10-CM

## 2012-10-16 DIAGNOSIS — F17200 Nicotine dependence, unspecified, uncomplicated: Secondary | ICD-10-CM | POA: Diagnosis present

## 2012-10-16 DIAGNOSIS — N3010 Interstitial cystitis (chronic) without hematuria: Secondary | ICD-10-CM | POA: Diagnosis present

## 2012-10-16 DIAGNOSIS — T40605A Adverse effect of unspecified narcotics, initial encounter: Secondary | ICD-10-CM | POA: Diagnosis not present

## 2012-10-16 DIAGNOSIS — F411 Generalized anxiety disorder: Secondary | ICD-10-CM | POA: Diagnosis present

## 2012-10-16 DIAGNOSIS — J449 Chronic obstructive pulmonary disease, unspecified: Secondary | ICD-10-CM | POA: Diagnosis present

## 2012-10-16 DIAGNOSIS — F3289 Other specified depressive episodes: Secondary | ICD-10-CM | POA: Diagnosis present

## 2012-10-16 DIAGNOSIS — M1990 Unspecified osteoarthritis, unspecified site: Secondary | ICD-10-CM | POA: Diagnosis present

## 2012-10-16 DIAGNOSIS — F329 Major depressive disorder, single episode, unspecified: Secondary | ICD-10-CM | POA: Diagnosis present

## 2012-10-16 DIAGNOSIS — Z888 Allergy status to other drugs, medicaments and biological substances status: Secondary | ICD-10-CM

## 2012-10-16 DIAGNOSIS — Z87442 Personal history of urinary calculi: Secondary | ICD-10-CM

## 2012-10-16 DIAGNOSIS — Z8249 Family history of ischemic heart disease and other diseases of the circulatory system: Secondary | ICD-10-CM

## 2012-10-16 DIAGNOSIS — K219 Gastro-esophageal reflux disease without esophagitis: Secondary | ICD-10-CM | POA: Diagnosis present

## 2012-10-16 DIAGNOSIS — G8929 Other chronic pain: Secondary | ICD-10-CM | POA: Diagnosis present

## 2012-10-16 DIAGNOSIS — M069 Rheumatoid arthritis, unspecified: Secondary | ICD-10-CM | POA: Diagnosis present

## 2012-10-16 DIAGNOSIS — Z882 Allergy status to sulfonamides status: Secondary | ICD-10-CM

## 2012-10-16 DIAGNOSIS — Z90710 Acquired absence of both cervix and uterus: Secondary | ICD-10-CM

## 2012-10-16 DIAGNOSIS — M549 Dorsalgia, unspecified: Secondary | ICD-10-CM | POA: Diagnosis present

## 2012-10-16 DIAGNOSIS — F4310 Post-traumatic stress disorder, unspecified: Secondary | ICD-10-CM | POA: Diagnosis present

## 2012-10-16 DIAGNOSIS — Z859 Personal history of malignant neoplasm, unspecified: Secondary | ICD-10-CM

## 2012-10-16 DIAGNOSIS — R109 Unspecified abdominal pain: Secondary | ICD-10-CM | POA: Diagnosis present

## 2012-10-16 DIAGNOSIS — E860 Dehydration: Secondary | ICD-10-CM | POA: Diagnosis present

## 2012-10-16 DIAGNOSIS — E876 Hypokalemia: Secondary | ICD-10-CM | POA: Diagnosis present

## 2012-10-16 DIAGNOSIS — J4489 Other specified chronic obstructive pulmonary disease: Secondary | ICD-10-CM | POA: Diagnosis present

## 2012-10-16 DIAGNOSIS — Z79899 Other long term (current) drug therapy: Secondary | ICD-10-CM

## 2012-10-16 LAB — CBC WITH DIFFERENTIAL/PLATELET
Basophils Absolute: 0 10*3/uL (ref 0.0–0.1)
Eosinophils Relative: 1 % (ref 0–5)
HCT: 39.6 % (ref 36.0–46.0)
Lymphocytes Relative: 20 % (ref 12–46)
Lymphs Abs: 2.3 10*3/uL (ref 0.7–4.0)
MCV: 88.8 fL (ref 78.0–100.0)
Neutro Abs: 7.7 10*3/uL (ref 1.7–7.7)
Platelets: 372 10*3/uL (ref 150–400)
RBC: 4.46 MIL/uL (ref 3.87–5.11)
WBC: 11.4 10*3/uL — ABNORMAL HIGH (ref 4.0–10.5)

## 2012-10-16 LAB — URINALYSIS, ROUTINE W REFLEX MICROSCOPIC
Glucose, UA: NEGATIVE mg/dL
Ketones, ur: NEGATIVE mg/dL
Leukocytes, UA: NEGATIVE
Nitrite: NEGATIVE
Specific Gravity, Urine: 1.01 (ref 1.005–1.030)
pH: 5.5 (ref 5.0–8.0)

## 2012-10-16 LAB — COMPREHENSIVE METABOLIC PANEL
Albumin: 3.7 g/dL (ref 3.5–5.2)
BUN: 24 mg/dL — ABNORMAL HIGH (ref 6–23)
Creatinine, Ser: 1.78 mg/dL — ABNORMAL HIGH (ref 0.50–1.10)
GFR calc Af Amer: 38 mL/min — ABNORMAL LOW (ref >90)
Glucose, Bld: 77 mg/dL (ref 70–99)
Total Bilirubin: 0.3 mg/dL (ref 0.3–1.2)
Total Protein: 7 g/dL (ref 6.0–8.3)

## 2012-10-16 LAB — MAGNESIUM: Magnesium: 2.1 mg/dL (ref 1.5–2.5)

## 2012-10-16 MED ORDER — DIPHENHYDRAMINE HCL 50 MG/ML IJ SOLN
25.0000 mg | Freq: Once | INTRAMUSCULAR | Status: AC
  Administered 2012-10-16: 25 mg via INTRAVENOUS
  Filled 2012-10-16: qty 1

## 2012-10-16 MED ORDER — FLUOXETINE HCL 20 MG PO CAPS
40.0000 mg | ORAL_CAPSULE | Freq: Every morning | ORAL | Status: DC
  Administered 2012-10-16 – 2012-10-20 (×5): 40 mg via ORAL
  Filled 2012-10-16 (×5): qty 2

## 2012-10-16 MED ORDER — ALPRAZOLAM 0.5 MG PO TABS
ORAL_TABLET | ORAL | Status: AC
  Filled 2012-10-16: qty 2

## 2012-10-16 MED ORDER — HYDROMORPHONE HCL PF 1 MG/ML IJ SOLN
1.0000 mg | INTRAMUSCULAR | Status: DC | PRN
  Administered 2012-10-16 – 2012-10-19 (×18): 2 mg via INTRAVENOUS
  Administered 2012-10-19: 1 mg via INTRAVENOUS
  Administered 2012-10-19: 2 mg via INTRAVENOUS
  Administered 2012-10-19: 1 mg via INTRAVENOUS
  Administered 2012-10-19: 2 mg via INTRAVENOUS
  Administered 2012-10-19: 1 mg via INTRAVENOUS
  Administered 2012-10-19: 2 mg via INTRAVENOUS
  Filled 2012-10-16 (×24): qty 2

## 2012-10-16 MED ORDER — DEXTROSE 5 % IV SOLN
1.0000 g | INTRAVENOUS | Status: DC
  Administered 2012-10-16 – 2012-10-19 (×4): 1 g via INTRAVENOUS
  Filled 2012-10-16 (×5): qty 10

## 2012-10-16 MED ORDER — FAMOTIDINE 20 MG PO TABS
10.0000 mg | ORAL_TABLET | Freq: Two times a day (BID) | ORAL | Status: DC
  Administered 2012-10-16 – 2012-10-20 (×8): 10 mg via ORAL
  Filled 2012-10-16 (×8): qty 1

## 2012-10-16 MED ORDER — PRAMIPEXOLE DIHYDROCHLORIDE 0.25 MG PO TABS
0.5000 mg | ORAL_TABLET | Freq: Every day | ORAL | Status: DC
  Administered 2012-10-16 – 2012-10-19 (×4): 0.5 mg via ORAL
  Filled 2012-10-16 (×6): qty 2

## 2012-10-16 MED ORDER — ALPRAZOLAM 0.5 MG PO TABS
1.0000 mg | ORAL_TABLET | Freq: Three times a day (TID) | ORAL | Status: DC
  Administered 2012-10-16 – 2012-10-20 (×13): 1 mg via ORAL
  Filled 2012-10-16 (×13): qty 2

## 2012-10-16 MED ORDER — ACETAMINOPHEN 500 MG PO TABS: 500.0000 mg | ORAL_TABLET | ORAL | Status: DC | PRN

## 2012-10-16 MED ORDER — ONDANSETRON HCL 4 MG/2ML IJ SOLN
4.0000 mg | Freq: Once | INTRAMUSCULAR | Status: AC
  Administered 2012-10-16: 4 mg via INTRAVENOUS
  Filled 2012-10-16: qty 2

## 2012-10-16 MED ORDER — ONDANSETRON HCL 4 MG PO TABS
8.0000 mg | ORAL_TABLET | Freq: Three times a day (TID) | ORAL | Status: DC | PRN
  Administered 2012-10-16 – 2012-10-19 (×5): 8 mg via ORAL
  Filled 2012-10-16 (×5): qty 2

## 2012-10-16 MED ORDER — HYDROMORPHONE HCL PF 1 MG/ML IJ SOLN
1.0000 mg | Freq: Once | INTRAMUSCULAR | Status: AC
  Administered 2012-10-16: 1 mg via INTRAVENOUS

## 2012-10-16 MED ORDER — HYDROMORPHONE HCL PF 1 MG/ML IJ SOLN
1.0000 mg | Freq: Once | INTRAMUSCULAR | Status: AC
  Administered 2012-10-16: 1 mg via INTRAVENOUS
  Filled 2012-10-16: qty 1

## 2012-10-16 MED ORDER — SODIUM CHLORIDE 0.9 % IV SOLN
Freq: Once | INTRAVENOUS | Status: AC
  Administered 2012-10-16: 06:00:00 via INTRAVENOUS

## 2012-10-16 MED ORDER — TOPIRAMATE 25 MG PO TABS
50.0000 mg | ORAL_TABLET | Freq: Every day | ORAL | Status: DC
  Administered 2012-10-16 – 2012-10-19 (×4): 50 mg via ORAL
  Filled 2012-10-16 (×6): qty 2

## 2012-10-16 MED ORDER — POTASSIUM CHLORIDE CRYS ER 10 MEQ PO TBCR
10.0000 meq | EXTENDED_RELEASE_TABLET | Freq: Three times a day (TID) | ORAL | Status: DC
  Administered 2012-10-16 – 2012-10-20 (×12): 10 meq via ORAL
  Filled 2012-10-16 (×12): qty 1

## 2012-10-16 MED ORDER — ALBUTEROL SULFATE (5 MG/ML) 0.5% IN NEBU
2.5000 mg | INHALATION_SOLUTION | RESPIRATORY_TRACT | Status: DC | PRN
  Administered 2012-10-17: 2.5 mg via RESPIRATORY_TRACT
  Filled 2012-10-16: qty 0.5

## 2012-10-16 MED ORDER — HYDROMORPHONE HCL PF 1 MG/ML IJ SOLN
INTRAMUSCULAR | Status: AC
  Filled 2012-10-16: qty 1

## 2012-10-16 MED ORDER — SODIUM CHLORIDE 0.9 % IV SOLN
INTRAVENOUS | Status: DC
  Administered 2012-10-16 – 2012-10-20 (×6): via INTRAVENOUS

## 2012-10-16 MED ORDER — DIPHENHYDRAMINE HCL 50 MG/ML IJ SOLN
25.0000 mg | Freq: Once | INTRAMUSCULAR | Status: AC
  Administered 2012-10-16: 25 mg via INTRAVENOUS

## 2012-10-16 MED ORDER — SODIUM CHLORIDE 0.9 % IJ SOLN
INTRAMUSCULAR | Status: AC
  Administered 2012-10-16: 10 mL
  Filled 2012-10-16: qty 3

## 2012-10-16 MED ORDER — LORATADINE 10 MG PO TABS
10.0000 mg | ORAL_TABLET | Freq: Every day | ORAL | Status: DC
  Administered 2012-10-16 – 2012-10-20 (×5): 10 mg via ORAL
  Filled 2012-10-16 (×5): qty 1

## 2012-10-16 MED ORDER — OXYCODONE HCL ER 10 MG PO T12A
10.0000 mg | EXTENDED_RELEASE_TABLET | Freq: Two times a day (BID) | ORAL | Status: DC
  Administered 2012-10-16 – 2012-10-20 (×9): 10 mg via ORAL
  Filled 2012-10-16 (×9): qty 1

## 2012-10-16 MED ORDER — DIPHENHYDRAMINE HCL 50 MG/ML IJ SOLN
25.0000 mg | Freq: Four times a day (QID) | INTRAMUSCULAR | Status: DC | PRN
  Administered 2012-10-16 – 2012-10-17 (×3): 25 mg via INTRAVENOUS
  Administered 2012-10-17: 23:00:00 via INTRAVENOUS
  Administered 2012-10-18 – 2012-10-19 (×7): 25 mg via INTRAVENOUS
  Filled 2012-10-16 (×12): qty 1

## 2012-10-16 MED ORDER — ESTRADIOL 0.1 MG/24HR TD PTTW: 0.1000 mg | MEDICATED_PATCH | TRANSDERMAL | Status: DC

## 2012-10-16 MED ORDER — CYCLOSPORINE 0.05 % OP EMUL
1.0000 [drp] | Freq: Two times a day (BID) | OPHTHALMIC | Status: DC
  Administered 2012-10-16: 1 [drp] via OPHTHALMIC
  Administered 2012-10-17: 10:00:00 via OPHTHALMIC
  Administered 2012-10-17 – 2012-10-19 (×4): 1 [drp] via OPHTHALMIC
  Administered 2012-10-19: 10:00:00 via OPHTHALMIC
  Administered 2012-10-20: 1 [drp] via OPHTHALMIC
  Filled 2012-10-16 (×12): qty 1

## 2012-10-16 MED ORDER — SODIUM CHLORIDE 0.9 % IV BOLUS (SEPSIS): 1000.0000 mL | INTRAVENOUS | Status: AC

## 2012-10-16 MED ORDER — DIPHENHYDRAMINE HCL 50 MG/ML IJ SOLN
INTRAMUSCULAR | Status: AC
  Administered 2012-10-16: 25 mg via INTRAVENOUS
  Filled 2012-10-16: qty 1

## 2012-10-16 MED ORDER — ALBUTEROL SULFATE HFA 108 (90 BASE) MCG/ACT IN AERS: 2.0000 | INHALATION_SPRAY | RESPIRATORY_TRACT | Status: DC | PRN

## 2012-10-16 MED ORDER — OXYCODONE HCL ER 10 MG PO T12A
EXTENDED_RELEASE_TABLET | ORAL | Status: AC
  Administered 2012-10-16: 10 mg
  Filled 2012-10-16: qty 1

## 2012-10-16 NOTE — H&P (Signed)
NAMEFRANCISCO, EYERLY                  ACCOUNT NO.:  [id number]  MEDICAL RECORD NO.:  [id number]  LOCATION:  [facility]04                         FACILITY:  [facility]  PHYSICIAN:  Mila Homer. Sudie Bailey, M.D.DATE OF BIRTH:  1961/04/02  DATE OF ADMISSION:  10/16/2012 DATE OF DISCHARGE:  LH                             HISTORY & PHYSICAL   This 51 year old presented to the emergency room with a variety of symptoms in the past, including muscle aches and pains, chest discomfort, bilateral flank pain and backache, sweats, elevated blood pressure and palpitations.  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  This was in her appendix.  Following this, she had a right hemicolectomy, both by Dr. Lovell Sheehan, general surgeon at this hospital.  By history she had a rechecked 5-HIAA on her urine, which 3 months ago was normal.  She has noted increased swelling, according to her all over her body, in the last month.  [place] used some of her sister's triamterene/hydrochlorothiazide 37.5/25 daily for about 5 days, then saw her LMD, Dr. Juanetta Gosling, several days ago.  He put her on triamterene/HCTZ 75/50, according to her.  In 2 days, she lost 5 pounds of fluid, based on her home scales.  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  She did this for about 4 years from about 2000 to 2004.  About 4 years ago, she fell down some steps at home.  At that point, the pain was significantly worse.  She also fell from an icy step on her husband's truck at least a year and half ago and had increased pain.  A fall from a ladder while she was painting in the house about 6 weeks ago also increased the pain.  She says now, however, that the pain she is having in the right CVA region is different from what she has had before.  She also has a history of fibromyalgia, irritable bowel syndrome, interstitial cystitis, restless legs syndrome,  migraine headaches.  The electronic record confirms this as well as other diagnoses of anxiety, kidney stones, gastroesophageal reflux disease, posttraumatic stress disorder, COPD, depression with anxiety, cervicalgia, tobacco use disorder.  She has smoked for about 8 years, averaging about a quarter-pack a day for a total of 2-pack years but says she was exposed to cigarette smoke over the years of secondhand smoke.  She started smoking at the time of her divorce.  She is now remarried, has grandchildren, is happy with her husband, is concerned about her medical problems.  HOME MEDICATIONS: 1. Albuterol by nebulizer. 2. Albuterol by inhaler. 3. Alprazolam 1 mg 1/2 to 1 tablet twice a day. 4. Adderall 20 mg each morning, which she has not taken the last     several weeks. 5. Azithromycin as a Z-Pak, which she has been on for 2 days. 6. Cetirizine 10 mg daily. 7. Cyclosporine ophthalmic emulsion 0.05% 1 drop in each eye twice a     day for dry eyes. 8. Vivelle-Dot 0.1 mg q.24h. patch every 3 days. 9. Famotidine 10 mg b.i.d. 10.Fluoxetine  40 mg daily. 11.Ondansetron 4 mg q.8h. for nausea. 12.OxyContin 10 mg 12-hour tablet b.i.d. 13.Mirapex 0.5 mg at bedtime. 14.Topiramate 50 mg at bedtime.  She had a hysterectomy in 1995 in [place], [place].  This was done due to endometriosis.  Within several years, she had to go back to have bilateral oophorectomy,  according to her, again due to endometriosis donning her ovaries.  She had been on Vivelle since then.  She has had hot flashes and sweats over the last 6 months.  She said her Vivelle patch was increased by Dr. Juanetta Gosling in the last month, but she has not noted that she is any better with these sweats.  She uses the Internet, has looked up symptoms, and told me she is sure that her back pain is from her kidneys.  PHYSICAL EXAMINATION:  GENERAL:  A pleasant middle-aged woman who is sitting up in bed. VITAL SIGNS:   Temperature is 98.4, pulse 93, respiratory rate 20, blood pressure 131/79.  Her husband is with her.  Her color was good.  O2 sat was 98%.  Her speech was normal and sensorium appeared to be intact. There is no sign of depression today. She had some tenderness on palpation over the anterior cervical nodes and also over the maxillary and frontal sinuses bilaterally.  Her pharynx is normal. HEART:  Regular rhythm, rate of about 80. LUNGS:  Appear to be clear throughout.  She is moving air without intercostal retraction or use of accessory muscles of respiration. ABDOMEN:  Soft without organomegaly or mass.  There was a vertical scar near the midline, which extended about 3-4 inches.  She had mild tenderness on palpation through the abdomen. She was complaining of bilateral CVA pain, really no pain over the back over the lumbar spine on palpation. She had 0 to trace edema of the distal legs and ankles.  Her feet were warm and otherwise normal.  Her admission white cell count was 11,400 with a normal differential. Her CMP showed a potassium of 3.4 with a BUN of 24, creatinine 1.78. Her UA was totally negative.  Her 12-lead EKG was normal.  Review of the record showed a potassium of 3.5 in July and a creatinine of 0.79 in July, just 3 months ago.  A 5-HIAA and 24-hour urine January 19, 2012 was 3.8.  Recheck 24-hour urine on July 07, 2012 was 2.6.  On review of the record, I did note that she has had multiple abdominal and pelvic CT exams.  I also checked the CT scan of the lung.  She is concerned because she told me she had 2 nodules in the lungs.  She has had now a CT scan of the lung done October 30, 2006, August 08, 2010, and May 06, 2012.  That most recent CT scan showed a tiny nodule in the right major fissure, which was unchanged from the CT scan of October 30, 2006.  There was also a 4 mm nodule in the perihilar right upper lobe, which is felt to be maybe slightly larger than  on August 08, 2010 but was not well seen on the first CT of October 30, 2006.  A 57-month recheck was suggested.  ADMISSION DIAGNOSES: 1. Dehydration. 2. Acute renal insufficiency. 3. Hypertension. 4. Abdominal pain. 5. Back pain. 6. Fibromyalgia. 7. Interstitial cystitis. 8. Irritable bowel syndrome. 9. History of migraine headaches. 10.Chronic obstructive pulmonary disease. 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 12.Anxiety. 13.History of kidney stones. 14.Posttraumatic  stress disorder. 15.Gastroesophageal reflux disease. I have discussed all of this in detail with the patient and her husband. This admission took over an hour because of the multiple symptoms and review of the record.  She will be admitted on IV fluids and her blood pressure monitored and pain treated.  I will repeat a CT scan of the abdomen.  I do not think she needs a CT of the chest at this point.  I will also recheck a 5-HIAA and a 24-hour urine while she is here.  We discussed her cigarettes, and currently she is on the electronic cigarette, off smoking.     Mila Homer. Sudie Bailey, M.D.     SDK/MEDQ  D:  10/16/2012  T:  10/16/2012  Job:  [id number]

## 2012-10-16 NOTE — ED Notes (Signed)
  Patient with multiple complaints:     Chest pain x 1 week    Bilateral flank pain    Diffuse abdominal pain    Sweating frequently Reports just recently started fluid pill over the past two weeks.

## 2012-10-16 NOTE — ED Notes (Signed)
Patient also complaining of nausea at this time.

## 2012-10-16 NOTE — ED Notes (Signed)
Reports lower back pain returning and requesting something further for pain control. EDP [place] notified.  Informed by EDP that Dr. Sudie Bailey is on his way to evaluate pt, and we should hold further meds until after his exam of pt. Pt notified.

## 2012-10-16 NOTE — ED Provider Notes (Signed)
History     CSN: [id number]  Arrival date & time 10/16/12  0459   First MD Initiated Contact with Patient 10/16/12 0518      Chief Complaint  Patient presents with  . Chest Pain  . Flank Pain  . Abdominal Pain    (Consider location/radiation/quality/duration/timing/severity/associated sxs/prior treatment) HPI  [name] is a 51 y.o. female who presents to the Emergency Department complaining of muscle aches and pains, chest discomfort, nausea, abdominal pain, bilateral flank pain, sweating and fluctuating blood pressure and heart rate. She has a h/o carcinoid tumor s/p right hemicolectomy. She was started on a diuretic two weeks ago to help with her blood pressure. The patient feels her symptoms are coming from a low potassium. She was seen by her PCP last week and had labs drawn for which she does not yet received results. She denies fever, chills, shortness of breath.   PCP Dr. Juanetta Gosling Surgeon Dr. Lovell Sheehan Oncology Dr. Mariel Sleet  Past Medical History  Diagnosis Date  . Arthritis   . COPD (chronic obstructive pulmonary disease)   . Bronchitis   . Fibromyalgia   . Interstitial cystitis   . Migraine   . Depression with anxiety   . UTI (lower urinary tract infection)   . RLS (restless legs syndrome)   . Alopecia, unspecified   . Cervicalgia   . Tobacco use disorder   . PTSD (post-traumatic stress disorder)   . IBS (irritable bowel syndrome)   . GERD (gastroesophageal reflux disease)   . Dysphagia   . PONV (postoperative nausea and vomiting)   . Shortness of breath   . Kidney stones   . Anxiety   . Carcinoid tumor of appendix   . Cancer     carcinoid tumor of appendix    Past Surgical History  Procedure Date  . Abdominal hysterectomy   . Cholecystectomy   . Tonsillectomy   . Cystostomy w/ bladder dilation     x 52 [place]  . Esophageal dilation 01/01/2012    Procedure: ESOPHAGEAL DILATION;  Surgeon: [name] Ade, MD;  Location: [facility];  Service:  Endoscopy;  Laterality: N/A;  maloney 84fr dialator  . Laparoscopic appendectomy 01/05/2012    Procedure: APPENDECTOMY LAPAROSCOPIC;  Surgeon: Dalia Heading;  Location: [facility];  Service: General;  Laterality: N/A;  . Appendectomy 01/05/2012  . Givens capsule study 01/27/2012    Procedure: GIVENS CAPSULE STUDY;  Surgeon: [name] Ade, MD;  Location: [facility];  Service: Endoscopy;  Laterality: N/A;  7:30  . Colon resection 01/30/2012    Procedure: HAND ASSISTED LAPAROSCOPIC COLON RESECTION;  Surgeon: Dalia Heading, MD;  Location: [facility];  Service: General;  Laterality: N/A;  . Partial colectomy 01/30/2012    Procedure: PARTIAL COLECTOMY;  Surgeon: Dalia Heading, MD;  Location: [facility];  Service: General;  Laterality: N/A;  . Cardiac surgery   . Abdominal surgery   . Colon surgery   . Hemicolectomy     Family History  Problem Relation Age of Onset  . Coronary artery disease Mother   . Heart attack Mother   . Pulmonary embolism Mother   . Alcohol abuse Father   . COPD Father   . Lung disease Father   . Anesthesia problems Neg Hx   . Hypotension Neg Hx   . Malignant hyperthermia Neg Hx   . Pseudochol deficiency Neg Hx   . Other Neg Hx     History  Substance Use Topics  . Smoking  status: Current Some Day Smoker -- 0.2 packs/day for 8 years    Types: Cigarettes    Last Attempt to Quit: 01/25/2012  . Smokeless tobacco: Never Used  . Alcohol Use: No    OB History    Grav Para Term Preterm Abortions TAB SAB Ect Mult Living   1 1 1       1       Review of Systems  Constitutional: Positive for diaphoresis. Negative for fever.       10 Systems reviewed and are negative for acute change except as noted in the HPI.  HENT: Negative for congestion.   Eyes: Negative for discharge and redness.  Respiratory: Positive for chest tightness. Negative for cough and shortness of breath.   Cardiovascular: Negative for chest pain.  Gastrointestinal: Positive for abdominal pain. Negative for  vomiting.  Musculoskeletal: Positive for myalgias. Negative for back pain.       Leg cramping. Cramping of feet and hands  Skin: Negative for rash.  Neurological: Negative for syncope, numbness and headaches.  Psychiatric/Behavioral:       No behavior change.    Allergies  Ativan; Clarithromycin; Nubain; Pseudoephedrine; Stadol; Sulfa antibiotics; Toradol; Factive; Other; Prochlorperazine edisylate; Promethazine hcl; Tofranil-pm; and Trimethobenzamide hcl  Home Medications   Current Outpatient Rx  Name Route Sig Dispense Refill  . OXYCODONE HCL ER 10 MG PO TB12 Oral Take 10 mg by mouth every 12 (twelve) hours as needed.    . TRIAMTERENE-HCTZ 75-50 MG PO TABS Oral Take 1 tablet by mouth daily.    Marland Kitchen ALPRAZOLAM 1 MG PO TABS Oral Take 0.5-1 mg by mouth 3 (three) times daily as needed. For anxiety and sleep    . AMPHETAMINE-DEXTROAMPHETAMINE 20 MG PO TABS Oral Take 10 mg by mouth every morning. For attention    . CETIRIZINE HCL 10 MG PO TABS Oral Take 10 mg by mouth every morning.     . CYCLOSPORINE 0.05 % OP EMUL Both Eyes Place 1 drop into both eyes 2 (two) times daily as needed. For dry eyes    . DEXLANSOPRAZOLE 30 MG PO CPDR Oral Take 30 mg by mouth every morning.     Marland Kitchen ESTRADIOL 0.1 MG/24HR TD PTTW Transdermal Place 1 patch onto the skin every 3 (three) days.    Marland Kitchen FLUOXETINE HCL 40 MG PO CAPS Oral Take 40 mg by mouth every morning.     Marland Kitchen HYDROCHLOROTHIAZIDE 25 MG PO TABS Oral Take 12.5 mg by mouth daily as needed. For fluid retention    . ONDANSETRON HCL 4 MG PO TABS Oral Take 4 mg by mouth every 8 (eight) hours as needed. For nausea    . OXYCODONE-ACETAMINOPHEN 10-325 MG PO TABS Oral Take 1 tablet by mouth every 4 (four) hours as needed for pain. For pain 40 tablet 0  . PRAMIPEXOLE DIHYDROCHLORIDE 0.5 MG PO TABS Oral Take 0.5 mg by mouth at bedtime.     . TOPIRAMATE 50 MG PO TABS Oral Take 25 mg by mouth at bedtime.     . VENTOLIN HFA 108 (90 BASE) MCG/ACT IN AERS Inhalation Inhale 2  puffs into the lungs every 6 (six) hours as needed. For shortness of breath      BP 125/83  Pulse 81  Temp 98.4 F (36.9 C) (Oral)  Resp 20  Ht 5\' 3"  (1.6 m)  Wt 158 lb (71.668 kg)  BMI 27.99 kg/m2  SpO2 97%  LMP 12/29/1993  Physical Exam  Nursing note and vitals reviewed.  Constitutional: She appears well-developed and well-nourished.       Awake, alert, nontoxic appearance.  HENT:  Head: Atraumatic.  Eyes: Right eye exhibits no discharge. Left eye exhibits no discharge.  Neck: Neck supple.  Cardiovascular: Normal heart sounds.   Pulmonary/Chest: Effort normal and breath sounds normal. She exhibits no tenderness.  Abdominal: Soft. Bowel sounds are normal. There is tenderness. There is no rebound.       Right sided abdominal tenderness, no rebound or guarding.  Genitourinary:       Mild  cva tenderness  Bilaterally to percussion  Musculoskeletal: She exhibits no tenderness.       Baseline ROM, no obvious new focal weakness.  Neurological:       Mental status and motor strength appears baseline for patient and situation.  Skin: No rash noted.  Psychiatric: She has a normal mood and affect.    ED Course  Procedures (including critical care time)   Date: 10/16/2012  0511  Rate: 77  Rhythm: normal sinus rhythm  QRS Axis: normal  Intervals: normal  ST/T Wave abnormalities: normal  Conduction Disutrbances:none  Narrative Interpretation:   Old EKG Reviewed: none available  Results for orders placed during the hospital encounter of 10/16/12  COMPREHENSIVE METABOLIC PANEL      Component Value Range   Sodium 136  135 - 145 mEq/L   Potassium 3.4 (*) 3.5 - 5.1 mEq/L   Chloride 98  96 - 112 mEq/L   CO2 28  19 - 32 mEq/L   Glucose, Bld 77  70 - 99 mg/dL   BUN 24 (*) 6 - 23 mg/dL   Creatinine, Ser 1.61 (*) 0.50 - 1.10 mg/dL   Calcium 9.4  8.4 - 09.6 mg/dL   Total Protein 7.0  6.0 - 8.3 g/dL   Albumin 3.7  3.5 - 5.2 g/dL   AST 18  0 - 37 U/L   ALT 19  0 - 35 U/L    Alkaline Phosphatase 83  39 - 117 U/L   Total Bilirubin 0.3  0.3 - 1.2 mg/dL   GFR calc non Af Amer 32 (*) >90 mL/min   GFR calc Af Amer 38 (*) >90 mL/min  CBC WITH DIFFERENTIAL      Component Value Range   WBC 11.4 (*) 4.0 - 10.5 K/uL   RBC 4.46  3.87 - 5.11 MIL/uL   Hemoglobin 13.9  12.0 - 15.0 g/dL   HCT 04.5  40.9 - 81.1 %   MCV 88.8  78.0 - 100.0 fL   MCH 31.2  26.0 - 34.0 pg   MCHC 35.1  30.0 - 36.0 g/dL   RDW 91.4  78.2 - 95.6 %   Platelets 372  150 - 400 K/uL   Neutrophils Relative 67  43 - 77 %   Neutro Abs 7.7  1.7 - 7.7 K/uL   Lymphocytes Relative 20  12 - 46 %   Lymphs Abs 2.3  0.7 - 4.0 K/uL   Monocytes Relative 12  3 - 12 %   Monocytes Absolute 1.4 (*) 0.1 - 1.0 K/uL   Eosinophils Relative 1  0 - 5 %   Eosinophils Absolute 0.1  0.0 - 0.7 K/uL   Basophils Relative 0  0 - 1 %   Basophils Absolute 0.0  0.0 - 0.1 K/uL      No diagnosis found.  7:40 AM:  T/C to Dr. Sudie Bailey, case discussed, including:  HPI, pertinent PM/SHx, VS/PE, dx testing, ED course and treatment.  He will see the patient in the ER.  MDM  Patient with abdominal pain, bilateral flank pain, nausea, sweating, elevated blood pressure, weakness and muscle pain. Recently started on diuretic. Labs with acute change in BUN/Cr suggestive of acute renal failure. Initiated fluid resuscitation.Patient has been given antiemetic and analgesics x 2. Spoke with Dr. Sudie Bailey who will admit the patient.  Pt stable in ED with no significant deterioration in condition.The patient appears reasonably stabilized for admission considering the current resources, flow, and capabilities available in the ED at this time, and I doubt any other [facility] requiring further screening and/or treatment in the ED prior to admission.  MDM Reviewed: nursing note, previous chart and vitals Reviewed previous: labs Interpretation: labs Total time providing critical care: 40 minutes. Consults: primary care provider           Nicoletta Dress. Colon Branch, MD 10/16/12 [phone]

## 2012-10-16 NOTE — ED Notes (Signed)
Patient complaining of pain that came back after ambulating to go to restroom, requesting something else for pain. Patient also states she is still nauseous and reports that benadryl helps with her nausea.

## 2012-10-16 NOTE — ED Notes (Signed)
Anxious, reporting severe back pain; requesting something for back pain; also reports nausea and requesting Benadryl for this.  Informed pt again that we are awaiting Dr. Sudie Bailey for further orders.

## 2012-10-17 LAB — URINE CULTURE
Colony Count: NO GROWTH
Culture: NO GROWTH

## 2012-10-17 LAB — CBC WITH DIFFERENTIAL/PLATELET
Basophils Absolute: 0 10*3/uL (ref 0.0–0.1)
Eosinophils Relative: 1 % (ref 0–5)
Lymphocytes Relative: 14 % (ref 12–46)
MCV: 89.7 fL (ref 78.0–100.0)
Neutro Abs: 7.8 10*3/uL — ABNORMAL HIGH (ref 1.7–7.7)
Neutrophils Relative %: 77 % (ref 43–77)
Platelets: 337 10*3/uL (ref 150–400)
RDW: 12.1 % (ref 11.5–15.5)
WBC: 10.2 10*3/uL (ref 4.0–10.5)

## 2012-10-17 LAB — BASIC METABOLIC PANEL
Calcium: 8.2 mg/dL — ABNORMAL LOW (ref 8.4–10.5)
Creatinine, Ser: 1.65 mg/dL — ABNORMAL HIGH (ref 0.50–1.10)
GFR calc Af Amer: 41 mL/min — ABNORMAL LOW (ref >90)
GFR calc non Af Amer: 36 mL/min — ABNORMAL LOW (ref >90)
Sodium: 136 mEq/L (ref 135–145)

## 2012-10-17 MED ORDER — SODIUM CHLORIDE 0.9 % IJ SOLN
INTRAMUSCULAR | Status: AC
  Administered 2012-10-17: 10 mL
  Filled 2012-10-17: qty 3

## 2012-10-17 MED ORDER — BISACODYL 5 MG PO TBEC
5.0000 mg | DELAYED_RELEASE_TABLET | Freq: Every day | ORAL | Status: DC
  Administered 2012-10-17 – 2012-10-20 (×4): 5 mg via ORAL
  Filled 2012-10-17 (×4): qty 1

## 2012-10-17 NOTE — Progress Notes (Signed)
NAME:  [name], [name]                  ACCOUNT NO.:  [id number]  MEDICAL RECORD NO.:  [id number]  LOCATION:  A301                          FACILITY:  [facility]  PHYSICIAN:  Mila Homer. Sudie Bailey, M.D.DATE OF BIRTH:  September 08, 1961  DATE OF PROCEDURE: DATE OF DISCHARGE:                                PROGRESS NOTE   CSN #[id number].  SUBJECTIVE:  She feels somewhat better today.  She feels like eating something.  She tells me that for at least 3 months, and possibly more, she has noted shortly after eating that she will have palpitations and feel like her face is hot.  OBJECTIVE:  VITAL SIGNS:  Temperature is 98.1, pulse 79, respiratory rate 16, blood pressure 114/72. GENERAL:  Her color is good.  Her speech is normal. She is sitting up in bed. HEART:  Heart has a regular rhythm with a rate of 70. LUNG EXAM:  Lungs appear to be clear throughout.  She is moving air well. ABDOMEN:  Soft, without demonstrable organomegaly or mass.  There is mild, diffuse tenderness on palpation. A CT scan of the abdomen and pelvis done yesterday showed the hemicolectomy and hysterectomy.  There was significant stool in the transverse colon, mild stool in the left colon and rectosigmoid colon. The kidneys were symmetrical in size with no hydronephrosis, hydroureter and no nephrolithiasis. Blood test today showed a BUN of 15 creatinine of 1.65, down from yesterday's BUN of 24 and creatinine 1.78.  Her white cell count had dropped to 10,200.  Her hemoglobin had dropped from 13.9 to 11.8.  Her glucose is 104. Her urine culture is pending.  ASSESSMENT: 1. Dehydration. 2. Acute renal insufficiency. 3. Hypertension, well controlled. 4. Abdominal pain, question etiology. 5. Question constipation.  PLAN:  I have added Dulcolax suppository daily.  She will continue with IV fluids, and I will recheck a CBC and a BMP in the morning.  We will try a regular diet today to see if she can tolerate this. I have  also asked  her to check her resting pulse rate and then check her pulse rate immediately after she eats, particularly if she has palpitations. She will be followed by Dr. Shaune Pollack, her LMD, tomorrow.     Mila Homer. Sudie Bailey, M.D.     SDK/MEDQ  D:  10/17/2012  T:  10/17/2012  Job:  [id number]

## 2012-10-18 ENCOUNTER — Inpatient Hospital Stay (HOSPITAL_COMMUNITY): Payer: Medicare Other

## 2012-10-18 LAB — CBC
HCT: 35 % — ABNORMAL LOW (ref 36.0–46.0)
RBC: 3.84 MIL/uL — ABNORMAL LOW (ref 3.87–5.11)
RDW: 12.6 % (ref 11.5–15.5)
WBC: 8.7 10*3/uL (ref 4.0–10.5)

## 2012-10-18 LAB — BASIC METABOLIC PANEL
BUN: 13 mg/dL (ref 6–23)
CO2: 27 mEq/L (ref 19–32)
Chloride: 103 mEq/L (ref 96–112)
GFR calc Af Amer: 42 mL/min — ABNORMAL LOW (ref >90)
Potassium: 4.2 mEq/L (ref 3.5–5.1)

## 2012-10-18 MED ORDER — SODIUM CHLORIDE 0.9 % IJ SOLN
INTRAMUSCULAR | Status: AC
  Administered 2012-10-18: 10 mL
  Filled 2012-10-18: qty 3

## 2012-10-18 MED ORDER — ESTRADIOL 0.1 MG/24HR TD PTTW: 0.1000 mg | MEDICATED_PATCH | TRANSDERMAL | Status: DC

## 2012-10-18 NOTE — Consult Note (Signed)
Reason for Consult: Acute kidney injury Referring Physician: Dr. Jonnie Kind Cox is an 51 y.o. female.  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 According to the patient shall see [name] back pain and [name] had steroid injection about a week ago. [name] pain continued to get worse without any improvement. Patient also states that [name] has some swelling of [name] legs and hand and has been taking fluid pill but [name] urine output continued to decline. Patient denies any previous history of kidney stone or renal failure. However patient with history of recurrent cystitis and status post multiple ureteral dialatation. Patient denies use of [name] nonsteroidal. [name] has been on Tylenol with codeine. Presently still [name] complains of [name] back pain however [name] denies any difficulty breathing or nausea or vomiting.  Past Medical History  Diagnosis Date  . Arthritis   . COPD (chronic obstructive pulmonary disease)   . Bronchitis   . Fibromyalgia   . Interstitial cystitis   . Migraine   . Depression with anxiety   . UTI (lower urinary tract infection)   . RLS (restless legs syndrome)   . Alopecia, unspecified   . Cervicalgia   . Tobacco use disorder   . PTSD (post-traumatic stress disorder)   . IBS (irritable bowel syndrome)   . GERD (gastroesophageal reflux disease)   . Dysphagia   . PONV (postoperative nausea and vomiting)   . Shortness of breath   . Kidney stones   . Anxiety   . Carcinoid tumor of appendix   . Cancer     carcinoid tumor of appendix    Past Surgical History  Procedure Date  . Abdominal hysterectomy   . Cholecystectomy   . Tonsillectomy   . Cystostomy w/ bladder dilation     x [address] [place]  . Esophageal dilation 01/01/2012    Procedure: ESOPHAGEAL DILATION;  Surgeon: [name] Ade, MD;  Location: [facility];  Service: Endoscopy;  Laterality: N/A;  maloney 31fr dialator  . Laparoscopic appendectomy 01/05/2012    Procedure: APPENDECTOMY LAPAROSCOPIC;  Surgeon: Dalia Heading;  Location: [facility];  Service: General;  Laterality: N/A;  . Appendectomy 01/05/2012  . Givens capsule study 01/27/2012    Procedure: GIVENS CAPSULE STUDY;  Surgeon: [name] Ade, MD;  Location: [facility] SUITE;  Service: Endoscopy;  Laterality: N/A;  7:30  . Colon resection 01/30/2012    Procedure: HAND ASSISTED LAPAROSCOPIC COLON RESECTION;  Surgeon: Dalia Heading, MD;  Location: [facility];  Service: General;  Laterality: N/A;  . Partial colectomy 01/30/2012    Procedure: PARTIAL COLECTOMY;  Surgeon: Dalia Heading, MD;  Location: [facility];  Service: General;  Laterality: N/A;  . Cardiac surgery   . Abdominal surgery   . Colon surgery   . Hemicolectomy     Family History  Problem Relation Age of Onset  . Coronary artery disease Mother   . Heart attack Mother   . Pulmonary embolism Mother   . Alcohol abuse Father   . COPD Father   . Lung disease Father   . Anesthesia problems Neg Hx   . Hypotension Neg Hx   . Malignant hyperthermia Neg Hx   . Pseudochol deficiency Neg Hx   . Other Neg Hx     Social History:  reports that [name] has been smoking  Cigarettes.  [name] has a 2 pack-year smoking history. [name] has never used smokeless tobacco. [name] reports that [name] does not drink alcohol or use illicit drugs.  Allergies:  Allergies  Allergen Reactions  . Ativan (Lorazepam) Other (See Comments)    Hallucinations  . Clarithromycin Itching and Nausea And Vomiting  . Pseudoephedrine Hives  . Stadol (Butorphanol Tartrate) Nausea And Vomiting  . Sulfa Antibiotics Nausea And Vomiting  . Toradol (Ketorolac Tromethamine) Other (See Comments)    abd pain   . Factive (Gemifloxacin Mesylate) Rash  . Other Rash    All decongestants **can take guafenisin  . Prochlorperazine Edisylate Anxiety  . Promethazine Hcl Anxiety  . Tofranil-Pm  Rash  . Trimethobenzamide Hcl Anxiety    Medications: I have reviewed the patient's current medications.  Results for orders placed during the hospital encounter of 10/16/12 (from the past 48 hour(s))  URINE CULTURE     Status: Normal   Collection Time   10/16/12  1:30 PM      Component Value Range Comment   Specimen Description URINE, RANDOM      Special Requests azithromycin      Culture  Setup Time 10/16/2012 22:23      Colony Count NO GROWTH      Culture NO GROWTH      Report Status 10/17/2012 FINAL     BASIC METABOLIC PANEL     Status: Abnormal   Collection Time   10/17/12  5:58 AM      Component Value Range Comment   Sodium 136  135 - 145 mEq/L    Potassium 3.5  3.5 - 5.1 mEq/L    Chloride 100  96 - 112 mEq/L    CO2 30  19 - 32 mEq/L    Glucose, Bld 104 (*) 70 - 99 mg/dL    BUN 15  6 - 23 mg/dL    Creatinine, Ser 1.61 (*) 0.50 - 1.10 mg/dL    Calcium 8.2 (*) 8.4 - 10.5 mg/dL    GFR calc non Af Amer 36 (*) >90 mL/min    GFR calc Af Amer 41 (*) >90 mL/min   CBC WITH DIFFERENTIAL     Status: Abnormal   Collection Time   10/17/12  6:19 AM      Component Value Range Comment   WBC 10.2  4.0 - 10.5 K/uL    RBC 3.80 (*) 3.87 - 5.11 MIL/uL    Hemoglobin 11.8 (*) 12.0 - 15.0 g/dL    HCT 09.6 (*) 04.5 - 46.0 %    MCV 89.7  78.0 - 100.0 fL    MCH 31.1  26.0 - 34.0 pg    MCHC 34.6  30.0 - 36.0 g/dL    RDW 40.9  81.1 - 91.4 %    Platelets 337  150 - 400 K/uL    Neutrophils Relative 77  43 - 77 %    Neutro Abs 7.8 (*) 1.7 - 7.7 K/uL    Lymphocytes Relative 14  12 - 46 %    Lymphs Abs 1.5  0.7 - 4.0 K/uL    Monocytes Relative 8  3 - 12 %    Monocytes Absolute 0.8  0.1 - 1.0 K/uL    Eosinophils Relative 1  0 - 5 %    Eosinophils Absolute 0.1  0.0 - 0.7 K/uL    Basophils Relative 0  0 - 1 %    Basophils Absolute 0.0  0.0 - 0.1 K/uL   CBC  Status: Abnormal   Collection Time   10/18/12  5:24 AM      Component Value Range Comment   WBC 8.7  4.0 - 10.5 K/uL    RBC 3.84  (*) 3.87 - 5.11 MIL/uL    Hemoglobin 11.8 (*) 12.0 - 15.0 g/dL    HCT 29.5 (*) 62.1 - 46.0 %    MCV 91.1  78.0 - 100.0 fL    MCH 30.7  26.0 - 34.0 pg    MCHC 33.7  30.0 - 36.0 g/dL    RDW 30.8  65.7 - 84.6 %    Platelets 334  150 - 400 K/uL   BASIC METABOLIC PANEL     Status: Abnormal   Collection Time   10/18/12  5:24 AM      Component Value Range Comment   Sodium 139  135 - 145 mEq/L    Potassium 4.2  3.5 - 5.1 mEq/L    Chloride 103  96 - 112 mEq/L    CO2 27  19 - 32 mEq/L    Glucose, Bld 97  70 - 99 mg/dL    BUN 13  6 - 23 mg/dL    Creatinine, Ser 9.62 (*) 0.50 - 1.10 mg/dL    Calcium 8.5  8.4 - 95.2 mg/dL    GFR calc non Af Amer 37 (*) >90 mL/min    GFR calc Af Amer 42 (*) >90 mL/min     Ct Abdomen Pelvis Wo Contrast  10/16/2012  *RADIOLOGY REPORT*  Clinical Data: Abdominal pain, back pain, history of previous bowel resection.  CT ABDOMEN AND PELVIS WITHOUT CONTRAST  Technique:  Multidetector CT imaging of the abdomen and pelvis was performed following the standard protocol without intravenous contrast.  Comparison: 02/08/2012  Findings: Sagittal images of the spine are unremarkable.  Lung bases are unremarkable.  The study is limited without IV contrast.  Unenhanced liver is unremarkable.  The patient is status post cholecystectomy.  The pancreas spleen and adrenal glands are unremarkable.  Unenhanced kidneys are symmetrical in size.  No nephrolithiasis.  No hydronephrosis or hydroureter.  Again noted status post right hemicolectomy.  Significant stool noted in the transverse colon.  Mild stool are noted in the left colon and rectosigmoid colon.  No small bowel obstruction.  No ascites or free air.  No adenopathy.  Bilateral no calcified ureteral calculi are noted.  The patient is status post hysterectomy.  No destructive bony lesions are noted within pelvis. The bladder is unremarkable.  IMPRESSION:  1.  No nephrolithiasis.  No hydronephrosis or hydroureter. 2.  No calcified ureteral  calculi are noted.  3.  The patient is status post right hemicolectomy.  Status post cholecystectomy. 4.  Moderate stool noted in transverse colon. 5.  Status post hysterectomy. 6.  No calcified calculi are noted within urinary bladder.   Original Report Authenticated By: Natasha Mead, M.D.    US Renal  10/18/2012  *RADIOLOGY REPORT*  Clinical Data: Acute renal failure.  Hypertension.  RENAL/URINARY TRACT ULTRASOUND COMPLETE  Comparison:  10/16/2012  Findings:  Right Kidney:  Measures 11.2 cm in length.  Normal renal echogenicity and echo texture.  No renal stones, masses, or hydronephrosis.  Left Kidney:  Measures 10.7 cm length in length and likewise appears normal.  Bladder:  Right ureteral jet visualized.  Left ureteral jet was not well seen.  IMPRESSION:  1.  Normal sonographic appearance the kidneys. 2.  In the urinary bladder, the left ureteral jet was not visualized.  This is likely incidental given the fact that there was no left hydronephrosis to suggest obstruction.   Original Report Authenticated By: Dellia Cloud, M.D.     Review of Systems  Cardiovascular: Positive for palpitations and leg swelling. Negative for chest pain and claudication.  Gastrointestinal: Positive for nausea. Negative for vomiting.  Genitourinary: Positive for dysuria and flank pain.  Musculoskeletal: Positive for myalgias and back pain.  Neurological: Positive for weakness.   Blood pressure 112/72, pulse 68, temperature 97.8 F (36.6 C), temperature source Oral, resp. rate 18, height 5\' 3"  (1.6 m), weight 71.668 kg (158 lb), last menstrual period 12/29/1993, SpO2 96.00%. Physical Exam  Eyes: No scleral icterus.  Neck: No JVD present.  Cardiovascular: Normal rate and regular rhythm.   No murmur heard. Respiratory: [name] has no wheezes. [name] has no rales.  GI: [name] exhibits no distension.  Musculoskeletal: [name] exhibits no edema and no tenderness.    Assessment/Plan: Problem #1 acute kidney injury etiology  as this moment not clear. [name] ultrasound showed right kidney to be 11.2 and left kidney 10.7 without any hydro-. In [name] doesn't have also any kidney stone. Since patient has recurrent urethral stricture status post dilation as this moment chronic reflux nephropathy and with some kidney injury cannot ruled out. The present increasing BUN and creatinine could be secondary to prerenal syndrome. Patient presently was good urine output and [name] doesn't have any proteinuria. Problem #2 history of carcinoid tumor of the appendix status post hemicolectomy Problem #3 degenerative joint disease Problem #4 history of recurrent interstitial cystitis status post dietitian according to [name] multiple times. Problem #5 history of COPD Problem #6 history of rheumatoid arthritis Problem #7 history of fibromyalgia Problem #8 history of depression. Plan: We'll increase [name] IV fluid to125 cc per hour We'll check [name] basic metabolic panel, 25 vitamin D and phosphorus level in the morning.  Arya Luttrull S 10/18/2012, 11:46 AM     2

## 2012-10-18 NOTE — Progress Notes (Signed)
Subjective: She says she still has pain in the back. She's very anxious. She has no other new complaints.  Objective: Vital signs in last 24 hours: Temp:  [97.5 F (36.4 C)-97.8 F (36.6 C)] 97.8 F (36.6 C) (10/21 0442) Pulse Rate:  [68-75] 68  (10/21 0442) Resp:  [18] 18  (10/21 0442) BP: (112-119)/(72-76) 112/72 mmHg (10/21 0442) SpO2:  [96 %-98 %] 96 % (10/21 0442) Weight change:  Last BM Date: 10/16/12  Intake/Output from previous day: 10/20 0701 - 10/21 0700 In: 2325.8 [P.O.:960; I.V.:1315.8; IV Piggyback:50] Out: 2750 [Urine:2750]  PHYSICAL EXAM General appearance: alert, cooperative and mild distress Resp: clear to auscultation bilaterally Cardio: regular rate and rhythm, S1, S2 normal, no murmur, click, rub or gallop GI: soft, non-tender; bowel sounds normal; no masses,  no organomegaly Extremities: extremities normal, atraumatic, no cyanosis or edema  Lab Results:    Basic Metabolic Panel:  Basename 10/18/12 0524 10/17/12 0558 10/16/12 0559  NA 139 136 --  K 4.2 3.5 --  CL 103 100 --  CO2 27 30 --  GLUCOSE 97 104* --  BUN 13 15 --  CREATININE 1.61* 1.65* --  CALCIUM 8.5 8.2* --  MG -- -- 2.1  PHOS -- -- --   Liver Function Tests:  Basename 10/16/12 0559  AST 18  ALT 19  ALKPHOS 83  BILITOT 0.3  PROT 7.0  ALBUMIN 3.7   No results found for this basename: LIPASE:2,AMYLASE:2 in the last 72 hours No results found for this basename: AMMONIA:2 in the last 72 hours CBC:  Basename 10/18/12 0524 10/17/12 0619 10/16/12 0559  WBC 8.7 10.2 --  NEUTROABS -- 7.8* 7.7  HGB 11.8* 11.8* --  HCT 35.0* 34.1* --  MCV 91.1 89.7 --  PLT 334 337 --   Cardiac Enzymes: No results found for this basename: CKTOTAL:3,CKMB:3,CKMBINDEX:3,TROPONINI:3 in the last 72 hours BNP: No results found for this basename: PROBNP:3 in the last 72 hours D-Dimer: No results found for this basename: DDIMER:2 in the last 72 hours CBG: No results found for this basename: GLUCAP:6  in the last 72 hours Hemoglobin A1C: No results found for this basename: HGBA1C in the last 72 hours Fasting Lipid Panel: No results found for this basename: CHOL,HDL,LDLCALC,TRIG,CHOLHDL,LDLDIRECT in the last 72 hours Thyroid Function Tests: No results found for this basename: TSH,T4TOTAL,FREET4,T3FREE,THYROIDAB in the last 72 hours Anemia Panel: No results found for this basename: VITAMINB12,FOLATE,FERRITIN,TIBC,IRON,RETICCTPCT in the last 72 hours Coagulation: No results found for this basename: LABPROT:2,INR:2 in the last 72 hours Urine Drug Screen: Drugs of Abuse     Component Value Date/Time   LABOPIA POSITIVE* 04/23/2009 1637   COCAINSCRNUR NONE DETECTED 04/23/2009 1637   LABBENZ POSITIVE* 04/23/2009 1637   AMPHETMU NONE DETECTED 04/23/2009 1637   THCU NONE DETECTED 04/23/2009 1637   LABBARB  Value: NONE DETECTED        DRUG SCREEN FOR MEDICAL PURPOSES ONLY.  IF CONFIRMATION IS NEEDED FOR ANY PURPOSE, NOTIFY LAB WITHIN 5 DAYS.        LOWEST DETECTABLE LIMITS FOR URINE DRUG SCREEN Drug Class       Cutoff (ng/mL) Amphetamine      1000 Barbiturate      200 Benzodiazepine   200 Tricyclics       300 Opiates          300 Cocaine          300 THC              50 04/23/2009 1637  Alcohol Level: No results found for this basename: ETH:2 in the last 72 hours Urinalysis:  Basename 10/16/12 0708  COLORURINE STRAW*  LABSPEC 1.010  PHURINE 5.5  GLUCOSEU NEGATIVE  HGBUR NEGATIVE  BILIRUBINUR NEGATIVE  KETONESUR NEGATIVE  PROTEINUR NEGATIVE  UROBILINOGEN 0.2  NITRITE NEGATIVE  LEUKOCYTESUR NEGATIVE   Misc. Labs:  ABGS No results found for this basename: PHART,PCO2,PO2ART,TCO2,HCO3 in the last 72 hours CULTURES Recent Results (from the past 240 hour(s))  URINE CULTURE     Status: Normal   Collection Time   10/16/12  1:30 PM      Component Value Range Status Comment   Specimen Description URINE, RANDOM   Final    Special Requests azithromycin   Final    Culture  Setup Time  10/16/2012 22:23   Final    Colony Count NO GROWTH   Final    Culture NO GROWTH   Final    Report Status 10/17/2012 FINAL   Final    Studies/Results: Ct Abdomen Pelvis Wo Contrast  10/16/2012  *RADIOLOGY REPORT*  Clinical Data: Abdominal pain, back pain, history of previous bowel resection.  CT ABDOMEN AND PELVIS WITHOUT CONTRAST  Technique:  Multidetector CT imaging of the abdomen and pelvis was performed following the standard protocol without intravenous contrast.  Comparison: 02/08/2012  Findings: Sagittal images of the spine are unremarkable.  Lung bases are unremarkable.  The study is limited without IV contrast.  Unenhanced liver is unremarkable.  The patient is status post cholecystectomy.  The pancreas spleen and adrenal glands are unremarkable.  Unenhanced kidneys are symmetrical in size.  No nephrolithiasis.  No hydronephrosis or hydroureter.  Again noted status post right hemicolectomy.  Significant stool noted in the transverse colon.  Mild stool are noted in the left colon and rectosigmoid colon.  No small bowel obstruction.  No ascites or free air.  No adenopathy.  Bilateral no calcified ureteral calculi are noted.  The patient is status post hysterectomy.  No destructive bony lesions are noted within pelvis. The bladder is unremarkable.  IMPRESSION:  1.  No nephrolithiasis.  No hydronephrosis or hydroureter. 2.  No calcified ureteral calculi are noted.  3.  The patient is status post right hemicolectomy.  Status post cholecystectomy. 4.  Moderate stool noted in transverse colon. 5.  Status post hysterectomy. 6.  No calcified calculi are noted within urinary bladder.   Original Report Authenticated By: Natasha Mead, M.D.     Medications:  Prior to Admission:  Prescriptions prior to admission  Medication Sig Dispense Refill  . albuterol (PROVENTIL) (2.5 MG/3ML) 0.083% nebulizer solution Take 2.5 mg by nebulization every 6 (six) hours as needed. Shortness of breath.      . ALPRAZolam  (XANAX) 1 MG tablet Take 0.5-1 mg by mouth 3 (three) times daily as needed. For anxiety and sleep      . amphetamine-dextroamphetamine (ADDERALL) 20 MG tablet Take 10 mg by mouth every morning. For attention      . azithromycin (ZITHROMAX) 250 MG tablet Take 250 mg by mouth daily.      . cetirizine (ZYRTEC) 10 MG tablet Take 10 mg by mouth every morning.       . cycloSPORINE (RESTASIS) 0.05 % ophthalmic emulsion Place 1 drop into both eyes 2 (two) times daily as needed. For dry eyes      . estradiol (VIVELLE-DOT) 0.1 MG/24HR Place 1 patch onto the skin every 3 (three) days.      . famotidine (PEPCID) 10 MG tablet Take  10 mg by mouth 2 (two) times daily.      Marland Kitchen FLUoxetine (PROZAC) 40 MG capsule Take 40 mg by mouth every morning.       . ondansetron (ZOFRAN) 4 MG tablet Take 4 mg by mouth every 8 (eight) hours as needed. For nausea      . oxyCODONE (OXYCONTIN) 10 MG 12 hr tablet Take 10 mg by mouth every 12 (twelve) hours as needed. Pain.      . pramipexole (MIRAPEX) 0.5 MG tablet Take 0.5 mg by mouth at bedtime.       . topiramate (TOPAMAX) 50 MG tablet Take 50 mg by mouth at bedtime.       . triamterene-hydrochlorothiazide (MAXZIDE) 75-50 MG per tablet Take 1 tablet by mouth daily.      . VENTOLIN HFA 108 (90 BASE) MCG/ACT inhaler Inhale 2 puffs into the lungs every 6 (six) hours as needed. For shortness of breath       Scheduled:   . ALPRAZolam  1 mg Oral TID  . bisacodyl  5 mg Oral Daily  . cefTRIAXone (ROCEPHIN) IVPB 1 gram/50 mL D5W  1 g Intravenous Q24H  . cycloSPORINE  1 drop Both Eyes BID  . estradiol  0.1 mg Transdermal Q3 days  . famotidine  10 mg Oral BID AC  . FLUoxetine  40 mg Oral q morning - 10a  . loratadine  10 mg Oral Daily  . OxyCODONE  10 mg Oral Q12H  . potassium chloride  10 mEq Oral TID  . pramipexole  0.5 mg Oral QHS  . sodium chloride      . sodium chloride      . sodium chloride      . sodium chloride      . topiramate  50 mg Oral QHS   Continuous:   .  sodium chloride 50 mL/hr at 10/18/12 0544   YQM:[id number], albuterol, albuterol, diphenhydrAMINE, HYDROmorphone (DILAUDID) injection, ondansetron  Assesment: She has acute change in her renal function. She had laboratory work done last week that showed a creatinine of approximately 1.1 and is now about 1.6. She is also complaining of flank pain. She has a history of interstitial cystitis but her urologist does not do inpatient consultations here. She has multiple other medical problems including chronic pain and fibromyalgia. She had history of a carcinoid tumor Active Problems:  * No active hospital problems. *     Plan: She will have a renal ultrasound and nephrology consult    LOS: 2 days   Jancarlos Thrun L 10/18/2012, 8:48 AM

## 2012-10-19 LAB — BASIC METABOLIC PANEL
GFR calc Af Amer: 45 mL/min — ABNORMAL LOW (ref >90)
GFR calc non Af Amer: 39 mL/min — ABNORMAL LOW (ref >90)
Potassium: 4.2 mEq/L (ref 3.5–5.1)
Sodium: 141 mEq/L (ref 135–145)

## 2012-10-19 LAB — PHOSPHORUS: Phosphorus: 2.7 mg/dL (ref 2.3–4.6)

## 2012-10-19 MED ORDER — SODIUM CHLORIDE 0.9 % IJ SOLN
INTRAMUSCULAR | Status: AC
  Administered 2012-10-19: 02:00:00
  Filled 2012-10-19: qty 3

## 2012-10-19 MED ORDER — ENOXAPARIN SODIUM 40 MG/0.4ML ~~LOC~~ SOLN
40.0000 mg | SUBCUTANEOUS | Status: DC
  Administered 2012-10-19: 40 mg via SUBCUTANEOUS
  Filled 2012-10-19: qty 0.4

## 2012-10-19 NOTE — Progress Notes (Signed)
Nurse contacted Dr. Juanetta Gosling.  Dr. Juanetta Gosling gave telephone order to start patient on Lovenox 40 mg subcutaneous once daily.  Orders followed.

## 2012-10-19 NOTE — Progress Notes (Signed)
Subjective: Interval History: has complaints abdominal pain, back pain. She has some nausea but no vomiting. Overall patient says that she's not feeling any better. She denies any difficulty breathing and orthopnea or paroxysmal nocturnal dyspnea..  Objective: Vital signs in last 24 hours: Temp:  [97 F (36.1 C)-97.4 F (36.3 C)] 97.4 F (36.3 C) (10/22 0512) Pulse Rate:  [65-73] 65  (10/22 0512) Resp:  [18] 18  (10/22 0512) BP: (120-141)/(80-86) 141/86 mmHg (10/22 0512) SpO2:  [98 %-100 %] 100 % (10/22 0512) Weight change:   Intake/Output from previous day: 10/21 0701 - 10/22 0700 In: 705 [P.O.:240; I.V.:465] Out: 1400 [Urine:1400] Intake/Output this shift:    General appearance: alert, cooperative and no distress Resp: clear to auscultation bilaterally Cardio: regular rate and rhythm, S1, S2 normal, no murmur, click, rub or gallop Extremities: extremities normal, atraumatic, no cyanosis or edema  Lab Results:  [facility] 10/18/12 0524 10/17/12 0619  WBC 8.7 10.2  HGB 11.8* 11.8*  HCT 35.0* 34.1*  PLT 334 337   BMET:  Basename 10/19/12 0624 10/18/12 0524  NA 141 139  K 4.2 4.2  CL 108 103  CO2 26 27  GLUCOSE 94 97  BUN 14 13  CREATININE 1.53* 1.61*  CALCIUM 8.4 8.5   No results found for this basename: PTH:2 in the last 72 hours Iron Studies: No results found for this basename: IRON,TIBC,TRANSFERRIN,FERRITIN in the last 72 hours  Studies/Results: US Renal  10/18/2012  *RADIOLOGY REPORT*  Clinical Data: Acute renal failure.  Hypertension.  RENAL/URINARY TRACT ULTRASOUND COMPLETE  Comparison:  10/16/2012  Findings:  Right Kidney:  Measures 11.2 cm in length.  Normal renal echogenicity and echo texture.  No renal stones, masses, or hydronephrosis.  Left Kidney:  Measures 10.7 cm length in length and likewise appears normal.  Bladder:  Right ureteral jet visualized.  Left ureteral jet was not well seen.  IMPRESSION:  1.  Normal sonographic appearance the kidneys. 2.  In  the urinary bladder, the left ureteral jet was not visualized.  This is likely incidental given the fact that there was no left hydronephrosis to suggest obstruction.   Original Report Authenticated By: Dellia Cloud, M.D.     I have reviewed the patient's current medications.  Assessment/Plan: Problem #1 renal failure most likely acute on chronic her BUN is 14 creatinine is 1.53 renal function seems to be improving. At this moment patient might have superimposed prerenal syndrome. Problem #2 history of anemia her hemoglobin is 11.8 hematocrit 35 was likely iron deficiency anemia. Problem #3 history of depression Problem #4 patient was back, chest and abdominal pain. According to the patient this is a recurrent problem she had she is her last surgery. 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 She still complaints of her abdominal pain related to that.  problem #6 hypokalemia she is on potassium supplement her potassium is normal today. Plan: We'll continue his present management We'll check her basic metabolic panel in the morning. We'll check urine for Hansel stain    LOS: 3 days   Monnica Saltsman S 10/19/2012,8:06 AM
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
## 2012-10-19 NOTE — Progress Notes (Signed)
Subjective: She is awake but has received pain medication. She has a number of questions about why she has acute renal failure which I cannot really answer at this point. I've explained to her the usual causes of acute renal failure but it is not clear that she has any of those at this time. Her renal function is actually somewhat better today. There may be a component of prerenal but her BUN has been pretty good. She complains of back pain but that is something of a chronic complaint. I do not think is related to her acute renal failure but will defer to the nephrologist about that  Objective: Vital signs in last 24 hours: Temp:  [97 F (36.1 C)-97.4 F (36.3 C)] 97.4 F (36.3 C) (10/22 0512) Pulse Rate:  [65-73] 65  (10/22 0512) Resp:  [18] 18  (10/22 0512) BP: (120-141)/(80-86) 141/86 mmHg (10/22 0512) SpO2:  [98 %-100 %] 100 % (10/22 0512) Weight change:  Last BM Date: 10/16/12  Intake/Output from previous day: 10/21 0701 - 10/22 0700 In: 705 [P.O.:240; I.V.:465] Out: 1400 [Urine:1400]  PHYSICAL EXAM General appearance: alert, cooperative and mild distress Resp: clear to auscultation bilaterally Cardio: regular rate and rhythm, S1, S2 normal, no murmur, click, rub or gallop GI: soft, non-tender; bowel sounds normal; no masses,  no organomegaly Extremities: extremities normal, atraumatic, no cyanosis or edema  Lab Results:    Basic Metabolic Panel:  Basename 10/19/12 0624 10/18/12 0524  NA 141 139  K 4.2 4.2  CL 108 103  CO2 26 27  GLUCOSE 94 97  BUN 14 13  CREATININE 1.53* 1.61*  CALCIUM 8.4 8.5  MG -- --  PHOS 2.7 --   Liver Function Tests: No results found for this basename: AST:2,ALT:2,ALKPHOS:2,BILITOT:2,PROT:2,ALBUMIN:2 in the last 72 hours No results found for this basename: LIPASE:2,AMYLASE:2 in the last 72 hours No results found for this basename: AMMONIA:2 in the last 72 hours CBC:  Basename 10/18/12 0524 10/17/12 0619  WBC 8.7 10.2  NEUTROABS --  7.8*  HGB 11.8* 11.8*  HCT 35.0* 34.1*  MCV 91.1 89.7  PLT 334 337   Cardiac Enzymes: No results found for this basename: CKTOTAL:3,CKMB:3,CKMBINDEX:3,TROPONINI:3 in the last 72 hours BNP: No results found for this basename: PROBNP:3 in the last 72 hours D-Dimer: No results found for this basename: DDIMER:2 in the last 72 hours CBG: No results found for this basename: GLUCAP:6 in the last 72 hours Hemoglobin A1C: No results found for this basename: HGBA1C in the last 72 hours Fasting Lipid Panel: No results found for this basename: CHOL,HDL,LDLCALC,TRIG,CHOLHDL,LDLDIRECT in the last 72 hours Thyroid Function Tests: No results found for this basename: TSH,T4TOTAL,FREET4,T3FREE,THYROIDAB in the last 72 hours Anemia Panel: No results found for this basename: VITAMINB12,FOLATE,FERRITIN,TIBC,IRON,RETICCTPCT in the last 72 hours Coagulation: No results found for this basename: LABPROT:2,INR:2 in the last 72 hours Urine Drug Screen: Drugs of Abuse     Component Value Date/Time   LABOPIA POSITIVE* 04/23/2009 1637   COCAINSCRNUR NONE DETECTED 04/23/2009 1637   LABBENZ POSITIVE* 04/23/2009 1637   AMPHETMU NONE DETECTED 04/23/2009 1637   THCU NONE DETECTED 04/23/2009 1637   LABBARB  Value: NONE DETECTED        DRUG SCREEN FOR MEDICAL PURPOSES ONLY.  IF CONFIRMATION IS NEEDED FOR ANY PURPOSE, NOTIFY LAB WITHIN 5 DAYS.        LOWEST DETECTABLE LIMITS FOR URINE DRUG SCREEN Drug Class       Cutoff (ng/mL) Amphetamine      1000 Barbiturate  200 Benzodiazepine   200 Tricyclics       300 Opiates          300 Cocaine          300 THC              50 04/23/2009 1637    Alcohol Level: No results found for this basename: ETH:2 in the last 72 hours Urinalysis: No results found for this basename: COLORURINE:2,APPERANCEUR:2,LABSPEC:2,PHURINE:2,GLUCOSEU:2,HGBUR:2,BILIRUBINUR:2,KETONESUR:2,PROTEINUR:2,UROBILINOGEN:2,NITRITE:2,LEUKOCYTESUR:2 in the last 72 hours Misc. Labs:  ABGS No results found for  this basename: PHART,PCO2,PO2ART,TCO2,HCO3 in the last 72 hours CULTURES Recent Results (from the past 240 hour(s))  URINE CULTURE     Status: Normal   Collection Time   10/16/12  1:30 PM      Component Value Range Status Comment   Specimen Description URINE, RANDOM   Final    Special Requests azithromycin   Final    Culture  Setup Time 10/16/2012 22:23   Final    Colony Count NO GROWTH   Final    Culture NO GROWTH   Final    Report Status 10/17/2012 FINAL   Final    Studies/Results: US Renal  10/18/2012  *RADIOLOGY REPORT*  Clinical Data: Acute renal failure.  Hypertension.  RENAL/URINARY TRACT ULTRASOUND COMPLETE  Comparison:  10/16/2012  Findings:  Right Kidney:  Measures 11.2 cm in length.  Normal renal echogenicity and echo texture.  No renal stones, masses, or hydronephrosis.  Left Kidney:  Measures 10.7 cm length in length and likewise appears normal.  Bladder:  Right ureteral jet visualized.  Left ureteral jet was not well seen.  IMPRESSION:  1.  Normal sonographic appearance the kidneys. 2.  In the urinary bladder, the left ureteral jet was not visualized.  This is likely incidental given the fact that there was no left hydronephrosis to suggest obstruction.   Original Report Authenticated By: Dellia Cloud, M.D.     Medications:  Prior to Admission:  Prescriptions prior to admission  Medication Sig Dispense Refill  . albuterol (PROVENTIL) (2.5 MG/3ML) 0.083% nebulizer solution Take 2.5 mg by nebulization every 6 (six) hours as needed. Shortness of breath.      . ALPRAZolam (XANAX) 1 MG tablet Take 0.5-1 mg by mouth 3 (three) times daily as needed. For anxiety and sleep      . amphetamine-dextroamphetamine (ADDERALL) 20 MG tablet Take 10 mg by mouth every morning. For attention      . azithromycin (ZITHROMAX) 250 MG tablet Take 250 mg by mouth daily.      . cetirizine (ZYRTEC) 10 MG tablet Take 10 mg by mouth every morning.       . cycloSPORINE (RESTASIS) 0.05 %  ophthalmic emulsion Place 1 drop into both eyes 2 (two) times daily as needed. For dry eyes      . estradiol (VIVELLE-DOT) 0.1 MG/24HR Place 1 patch onto the skin every 3 (three) days.      . famotidine (PEPCID) 10 MG tablet Take 10 mg by mouth 2 (two) times daily.      Marland Kitchen FLUoxetine (PROZAC) 40 MG capsule Take 40 mg by mouth every morning.       . ondansetron (ZOFRAN) 4 MG tablet Take 4 mg by mouth every 8 (eight) hours as needed. For nausea      . oxyCODONE (OXYCONTIN) 10 MG 12 hr tablet Take 10 mg by mouth every 12 (twelve) hours as needed. Pain.      . pramipexole (MIRAPEX) 0.5 MG tablet Take 0.5  mg by mouth at bedtime.       . topiramate (TOPAMAX) 50 MG tablet Take 50 mg by mouth at bedtime.       . triamterene-hydrochlorothiazide (MAXZIDE) 75-50 MG per tablet Take 1 tablet by mouth daily.      . VENTOLIN HFA 108 (90 BASE) MCG/ACT inhaler Inhale 2 puffs into the lungs every 6 (six) hours as needed. For shortness of breath       Scheduled:   . ALPRAZolam  1 mg Oral TID  . bisacodyl  5 mg Oral Daily  . cefTRIAXone (ROCEPHIN) IVPB 1 gram/50 mL D5W  1 g Intravenous Q24H  . cycloSPORINE  1 drop Both Eyes BID  . estradiol  0.1 mg Transdermal Q3 days  . famotidine  10 mg Oral BID AC  . FLUoxetine  40 mg Oral q morning - 10a  . loratadine  10 mg Oral Daily  . OxyCODONE  10 mg Oral Q12H  . potassium chloride  10 mEq Oral TID  . pramipexole  0.5 mg Oral QHS  . sodium chloride      . topiramate  50 mg Oral QHS  . DISCONTD: estradiol  0.1 mg Transdermal Q3 days   Continuous:   . sodium chloride 100 mL/hr at 10/19/12 0149   AVW:[id number], albuterol, albuterol, diphenhydrAMINE, HYDROmorphone (DILAUDID) injection, ondansetron  Assesment: She has acute renal failure that Dr. Kristian Covey thinks is probably acute on chronic. She has multiple other medical problems. She does not have a definite precipitating event. Her creatinine is better and now is about 1.5. Active Problems:  * No active  hospital problems. *     Plan: Continue with current treatments she does seem to be improving    LOS: 3 days   Christerpher Clos L 10/19/2012, 12:04 PM
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
## 2012-10-20 LAB — BASIC METABOLIC PANEL
BUN: 13 mg/dL (ref 6–23)
CO2: 26 mEq/L (ref 19–32)
Chloride: 108 mEq/L (ref 96–112)
Creatinine, Ser: 1.49 mg/dL — ABNORMAL HIGH (ref 0.50–1.10)
Glucose, Bld: 88 mg/dL (ref 70–99)

## 2012-10-20 LAB — ANA: Anti Nuclear Antibody(ANA): NEGATIVE

## 2012-10-20 MED ORDER — SODIUM CHLORIDE 0.9 % IJ SOLN
INTRAMUSCULAR | Status: AC
  Filled 2012-10-20: qty 3

## 2012-10-20 MED ORDER — SODIUM CHLORIDE 0.9 % IJ SOLN
INTRAMUSCULAR | Status: AC
  Administered 2012-10-20: 01:00:00
  Filled 2012-10-20: qty 3

## 2012-10-20 MED ORDER — OXYCODONE HCL 5 MG PO TABS
5.0000 mg | ORAL_TABLET | ORAL | Status: DC | PRN
  Administered 2012-10-20 (×2): 5 mg via ORAL
  Filled 2012-10-20 (×2): qty 1

## 2012-10-20 NOTE — Progress Notes (Signed)
[name]  MRN: [id number]  DOB/AGE: 12-30-1960 51 y.o.  Primary Care Physician:HAWKINS,EDWARD L, MD  Admit date: 10/16/2012  Chief Complaint:  Chief Complaint  Patient presents with  . Chest Pain  . Flank Pain  . Abdominal Pain    S-Pt presented on  10/16/2012 with  Chief Complaint  Patient presents with  . Chest Pain  . Flank Pain  . Abdominal Pain  .    Pt today feels better    Pt asking if she can go home.  Meds    . ALPRAZolam  1 mg Oral TID  . bisacodyl  5 mg Oral Daily  . cycloSPORINE  1 drop Both Eyes BID  . enoxaparin (LOVENOX) injection  40 mg Subcutaneous Q24H  . estradiol  0.1 mg Transdermal Q3 days  . famotidine  10 mg Oral BID AC  . FLUoxetine  40 mg Oral q morning - 10a  . loratadine  10 mg Oral Daily  . OxyCODONE  10 mg Oral Q12H  . potassium chloride  10 mEq Oral TID  . pramipexole  0.5 mg Oral QHS  . sodium chloride      . sodium chloride      . sodium chloride      . topiramate  50 mg Oral QHS  . DISCONTD: cefTRIAXone (ROCEPHIN) IVPB 1 gram/50 mL D5W  1 g Intravenous Q24H      Physical Exam: Vital signs in last 24 hours: Temp:  [98.2 F (36.8 C)-98.5 F (36.9 C)] 98.2 F (36.8 C) (10/23 0609) Pulse Rate:  [69-81] 69  (10/23 0609) Resp:  [16-20] 20  (10/23 0609) BP: (116-126)/(78-83) 126/83 mmHg (10/23 0609) SpO2:  [97 %-99 %] 99 % (10/23 0609) Weight change:  Last BM Date: 10/16/12  Intake/Output from previous day: 10/22 0701 - 10/23 0700 In: 1880 [P.O.:1080; I.V.:800] Out: 1850 [Urine:1850]     Physical Exam: General- pt is lethargic but arousable, follows commands. Resp- No acute REsp distress, CTA B/L NO Rhonchi CVS- S1S2 regular in rate and rhythm GIT- BS+, soft, NT, ND EXT- NO LE Edema, Cyanosis   Lab Results: CBC  Basename 10/18/12 0524  WBC 8.7  HGB 11.8*  HCT 35.0*  PLT 334    BMET  Basename 10/20/12 0451 10/19/12 0624  NA 142 141  K 4.1 4.2  CL 108 108  CO2 26 26  GLUCOSE 88 94  BUN 13 14    CREATININE 1.49* 1.53*  CALCIUM 8.3* 8.4    Creat trend 2013  0.79==>1.78==>1.49   MICRO Recent Results (from the past 240 hour(s))  URINE CULTURE     Status: Normal   Collection Time   10/16/12  1:30 PM      Component Value Range Status Comment   Specimen Description URINE, RANDOM   Final    Special Requests azithromycin   Final    Culture  Setup Time 10/16/2012 22:23   Final    Colony Count NO GROWTH   Final    Culture NO GROWTH   Final    Report Status 10/17/2012 FINAL   Final       Lab Results  Component Value Date   CALCIUM 8.3* 10/20/2012   CAION 1.06* 09/14/2010   PHOS 2.7 10/19/2012      Results for COX, Marnesha L (MRN [id number]) as of 10/20/2012 11:11  Ref. 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       Breella Vanostrand S 10/20/2012, 10:27 AM

## 2012-10-20 NOTE — Progress Notes (Signed)
Pt. IV infiltrated. 3 RN's attempted to regain access and failed. MD notified, and reported that He would visit pt. This morning.

## 2012-10-20 NOTE — Progress Notes (Signed)
Subjective: Her IV has infiltrated. She is very confused and I think it's from IV dialogue which I have discontinued. Her renal function is slightly better. I will see if the nephrology consult and feels like she needs to have IV fluids now. I do note that she has a somewhat abnormal complement level.  Objective: Vital signs in last 24 hours: Temp:  [98.2 F (36.8 C)-98.5 F (36.9 C)] 98.2 F (36.8 C) (10/23 0609) Pulse Rate:  [69-81] 69  (10/23 0609) Resp:  [16-20] 20  (10/23 0609) BP: (116-126)/(78-83) 126/83 mmHg (10/23 0609) SpO2:  [97 %-99 %] 99 % (10/23 0609) Weight change:  Last BM Date: 10/16/12  Intake/Output from previous day: 10/22 0701 - 10/23 0700 In: 1880 [P.O.:1080; I.V.:800] Out: 1850 [Urine:1850]  PHYSICAL EXAM General appearance: no distress and Confused Resp: clear to auscultation bilaterally Cardio: regular rate and rhythm, S1, S2 normal, no murmur, click, rub or gallop GI: soft, non-tender; bowel sounds normal; no masses,  no organomegaly Extremities: extremities normal, atraumatic, no cyanosis or edema  Lab Results:    Basic Metabolic Panel:  Basename 10/20/12 0451 10/19/12 0624  NA 142 141  K 4.1 4.2  CL 108 108  CO2 26 26  GLUCOSE 88 94  BUN 13 14  CREATININE 1.49* 1.53*  CALCIUM 8.3* 8.4  MG -- --  PHOS -- 2.7   Liver Function Tests: No results found for this basename: AST:2,ALT:2,ALKPHOS:2,BILITOT:2,PROT:2,ALBUMIN:2 in the last 72 hours No results found for this basename: LIPASE:2,AMYLASE:2 in the last 72 hours No results found for this basename: AMMONIA:2 in the last 72 hours CBC:  Basename 10/18/12 0524  WBC 8.7  NEUTROABS --  HGB 11.8*  HCT 35.0*  MCV 91.1  PLT 334   Cardiac Enzymes: No results found for this basename: CKTOTAL:3,CKMB:3,CKMBINDEX:3,TROPONINI:3 in the last 72 hours BNP: No results found for this basename: PROBNP:3 in the last 72 hours D-Dimer: No results found for this basename: DDIMER:2 in the last 72  hours CBG: No results found for this basename: GLUCAP:6 in the last 72 hours Hemoglobin A1C: No results found for this basename: HGBA1C in the last 72 hours Fasting Lipid Panel: No results found for this basename: CHOL,HDL,LDLCALC,TRIG,CHOLHDL,LDLDIRECT in the last 72 hours Thyroid Function Tests: No results found for this basename: TSH,T4TOTAL,FREET4,T3FREE,THYROIDAB in the last 72 hours Anemia Panel: No results found for this basename: VITAMINB12,FOLATE,FERRITIN,TIBC,IRON,RETICCTPCT in the last 72 hours Coagulation: No results found for this basename: LABPROT:2,INR:2 in the last 72 hours Urine Drug Screen: Drugs of Abuse     Component Value Date/Time   LABOPIA POSITIVE* 04/23/2009 1637   COCAINSCRNUR NONE DETECTED 04/23/2009 1637   LABBENZ POSITIVE* 04/23/2009 1637   AMPHETMU NONE DETECTED 04/23/2009 1637   THCU NONE DETECTED 04/23/2009 1637   LABBARB  Value: NONE DETECTED        DRUG SCREEN FOR MEDICAL PURPOSES ONLY.  IF CONFIRMATION IS NEEDED FOR ANY PURPOSE, NOTIFY LAB WITHIN 5 DAYS.        LOWEST DETECTABLE LIMITS FOR URINE DRUG SCREEN Drug Class       Cutoff (ng/mL) Amphetamine      1000 Barbiturate      200 Benzodiazepine   200 Tricyclics       300 Opiates          300 Cocaine          300 THC              50 04/23/2009 1637    Alcohol Level: No results found  for this basename: ETH:2 in the last 72 hours Urinalysis: No results found for this basename: COLORURINE:2,APPERANCEUR:2,LABSPEC:2,PHURINE:2,GLUCOSEU:2,HGBUR:2,BILIRUBINUR:2,KETONESUR:2,PROTEINUR:2,UROBILINOGEN:2,NITRITE:2,LEUKOCYTESUR:2 in the last 72 hours Misc. Labs:  ABGS No results found for this basename: PHART,PCO2,PO2ART,TCO2,HCO3 in the last 72 hours CULTURES Recent Results (from the past 240 hour(s))  URINE CULTURE     Status: Normal   Collection Time   10/16/12  1:30 PM      Component Value Range Status Comment   Specimen Description URINE, RANDOM   Final    Special Requests azithromycin   Final    Culture   Setup Time 10/16/2012 22:23   Final    Colony Count NO GROWTH   Final    Culture NO GROWTH   Final    Report Status 10/17/2012 FINAL   Final    Studies/Results: US Renal  10/18/2012  *RADIOLOGY REPORT*  Clinical Data: Acute renal failure.  Hypertension.  RENAL/URINARY TRACT ULTRASOUND COMPLETE  Comparison:  10/16/2012  Findings:  Right Kidney:  Measures 11.2 cm in length.  Normal renal echogenicity and echo texture.  No renal stones, masses, or hydronephrosis.  Left Kidney:  Measures 10.7 cm length in length and likewise appears normal.  Bladder:  Right ureteral jet visualized.  Left ureteral jet was not well seen.  IMPRESSION:  1.  Normal sonographic appearance the kidneys. 2.  In the urinary bladder, the left ureteral jet was not visualized.  This is likely incidental given the fact that there was no left hydronephrosis to suggest obstruction.   Original Report Authenticated By: Dellia Cloud, M.D.     Medications:  Prior to Admission:  Prescriptions prior to admission  Medication Sig Dispense Refill  . albuterol (PROVENTIL) (2.5 MG/3ML) 0.083% nebulizer solution Take 2.5 mg by nebulization every 6 (six) hours as needed. Shortness of breath.      . ALPRAZolam (XANAX) 1 MG tablet Take 0.5-1 mg by mouth 3 (three) times daily as needed. For anxiety and sleep      . amphetamine-dextroamphetamine (ADDERALL) 20 MG tablet Take 10 mg by mouth every morning. For attention      . azithromycin (ZITHROMAX) 250 MG tablet Take 250 mg by mouth daily.      . cetirizine (ZYRTEC) 10 MG tablet Take 10 mg by mouth every morning.       . cycloSPORINE (RESTASIS) 0.05 % ophthalmic emulsion Place 1 drop into both eyes 2 (two) times daily as needed. For dry eyes      . estradiol (VIVELLE-DOT) 0.1 MG/24HR Place 1 patch onto the skin every 3 (three) days.      . famotidine (PEPCID) 10 MG tablet Take 10 mg by mouth 2 (two) times daily.      Marland Kitchen FLUoxetine (PROZAC) 40 MG capsule Take 40 mg by mouth every morning.        . ondansetron (ZOFRAN) 4 MG tablet Take 4 mg by mouth every 8 (eight) hours as needed. For nausea      . oxyCODONE (OXYCONTIN) 10 MG 12 hr tablet Take 10 mg by mouth every 12 (twelve) hours as needed. Pain.      . pramipexole (MIRAPEX) 0.5 MG tablet Take 0.5 mg by mouth at bedtime.       . topiramate (TOPAMAX) 50 MG tablet Take 50 mg by mouth at bedtime.       . triamterene-hydrochlorothiazide (MAXZIDE) 75-50 MG per tablet Take 1 tablet by mouth daily.      . VENTOLIN HFA 108 (90 BASE) MCG/ACT inhaler Inhale 2 puffs into  the lungs every 6 (six) hours as needed. For shortness of breath       Scheduled:   . ALPRAZolam  1 mg Oral TID  . bisacodyl  5 mg Oral Daily  . cycloSPORINE  1 drop Both Eyes BID  . enoxaparin (LOVENOX) injection  40 mg Subcutaneous Q24H  . estradiol  0.1 mg Transdermal Q3 days  . famotidine  10 mg Oral BID AC  . FLUoxetine  40 mg Oral q morning - 10a  . loratadine  10 mg Oral Daily  . OxyCODONE  10 mg Oral Q12H  . potassium chloride  10 mEq Oral TID  . pramipexole  0.5 mg Oral QHS  . sodium chloride      . sodium chloride      . sodium chloride      . topiramate  50 mg Oral QHS  . DISCONTD: cefTRIAXone (ROCEPHIN) IVPB 1 gram/50 mL D5W  1 g Intravenous Q24H   Continuous:   . sodium chloride 100 mL/hr at 10/20/12 0146   RUE:[id number], albuterol, albuterol, ondansetron, oxyCODONE, DISCONTD: diphenhydrAMINE, DISCONTD:  HYDROmorphone (DILAUDID) injection  Assesment: She has acute on chronic renal failure. She does not have IV access now. She has been confused and I think it's from pain medication Active Problems:  * No active hospital problems. *     Plan: DC IV fluids continue her other treatments    LOS: 4 days   Laneisha Mino L 10/20/2012, 8:56 AM

## 2012-10-20 NOTE — Progress Notes (Signed)
Patient received discharge instructions along with follow up appointments. Patient verbalized understanding of all instructions. Patient was escorted by staff to vehicle. Patient discharged to home in stable condition. 

## 2012-10-23 NOTE — Discharge Summary (Signed)
Physician Discharge Summary  Patient ID: BARABRA TIMP MRN: [id number] DOB/AGE: [birth year] 51 y.o. Primary Care Physician:Naphtali Riede L, MD Admit date: 10/16/2012 Discharge date: 10/23/2012    Discharge Diagnoses:   Active Problems:  * No active hospital problems. *   acute on chronic renal failure Carcinoid tumor of the cecum Chronic low back pain Fibromyalgia Irritable  bowel syndrome Interstitial cystitis COPD Gastroesophageal reflux disease Anxiety Depression Chronic headaches     Medication List     As of 10/23/2012  9:35 AM    STOP taking these medications         azithromycin 250 MG tablet   Commonly known as: ZITHROMAX      TAKE these medications         ALPRAZolam 1 MG tablet   Commonly known as: XANAX   Take 0.5-1 mg by mouth 3 (three) times daily as needed. For anxiety and sleep      amphetamine-dextroamphetamine 20 MG tablet   Commonly known as: ADDERALL   Take 10 mg by mouth every morning. For attention      cetirizine 10 MG tablet   Commonly known as: ZYRTEC   Take 10 mg by mouth every morning.      cycloSPORINE 0.05 % ophthalmic emulsion   Commonly known as: RESTASIS   Place 1 drop into both eyes 2 (two) times daily as needed. For dry eyes      estradiol 0.1 MG/24HR   Commonly known as: VIVELLE-DOT   Place 1 patch onto the skin every 3 (three) days.      famotidine 10 MG tablet   Commonly known as: PEPCID   Take 10 mg by mouth 2 (two) times daily.      FLUoxetine 40 MG capsule   Commonly known as: PROZAC   Take 40 mg by mouth every morning.      ondansetron 4 MG tablet   Commonly known as: ZOFRAN   Take 4 mg by mouth every 8 (eight) hours as needed. For nausea      oxyCODONE 10 MG 12 hr tablet   Commonly known as: OXYCONTIN   Take 10 mg by mouth every 12 (twelve) hours as needed. Pain.      pramipexole 0.5 MG tablet   Commonly known as: MIRAPEX   Take 0.5 mg by mouth at bedtime.      topiramate 50 MG tablet   Commonly  known as: TOPAMAX   Take 50 mg by mouth at bedtime.      triamterene-hydrochlorothiazide 75-50 MG per tablet   Commonly known as: MAXZIDE   Take 1 tablet by mouth daily.      VENTOLIN HFA 108 (90 BASE) MCG/ACT inhaler   Generic drug: albuterol   Inhale 2 puffs into the lungs every 6 (six) hours as needed. For shortness of breath      albuterol (2.5 MG/3ML) 0.083% nebulizer solution   Commonly known as: PROVENTIL   Take 2.5 mg by nebulization every 6 (six) hours as needed. Shortness of breath.        Discharged Condition: Improved    Consults: Nephrology Significant Diagnostic Studies: Ct Abdomen Pelvis Wo Contrast  10/16/2012  *RADIOLOGY REPORT*  Clinical Data: Abdominal pain, back pain, history of previous bowel resection.  CT ABDOMEN AND PELVIS WITHOUT CONTRAST  Technique:  Multidetector CT imaging of the abdomen and pelvis was performed following the standard protocol without intravenous contrast.  Comparison: 02/08/2012  Findings: Sagittal images of the spine are unremarkable.  Lung bases  are unremarkable.  The study is limited without IV contrast.  Unenhanced liver is unremarkable.  The patient is status post cholecystectomy.  The pancreas spleen and adrenal glands are unremarkable.  Unenhanced kidneys are symmetrical in size.  No nephrolithiasis.  No hydronephrosis or hydroureter.  Again noted status post right hemicolectomy.  Significant stool noted in the transverse colon.  Mild stool are noted in the left colon and rectosigmoid colon.  No small bowel obstruction.  No ascites or free air.  No adenopathy.  Bilateral no calcified ureteral calculi are noted.  The patient is status post hysterectomy.  No destructive bony lesions are noted within pelvis. The bladder is unremarkable.  IMPRESSION:  1.  No nephrolithiasis.  No hydronephrosis or hydroureter. 2.  No calcified ureteral calculi are noted.  3.  The patient is status post right hemicolectomy.  Status post cholecystectomy. 4.   Moderate stool noted in transverse colon. 5.  Status post hysterectomy. 6.  No calcified calculi are noted within urinary bladder.   Original Report Authenticated By: Natasha Mead, M.D.    US Renal  10/18/2012  *RADIOLOGY REPORT*  Clinical Data: Acute renal failure.  Hypertension.  RENAL/URINARY TRACT ULTRASOUND COMPLETE  Comparison:  10/16/2012  Findings:  Right Kidney:  Measures 11.2 cm in length.  Normal renal echogenicity and echo texture.  No renal stones, masses, or hydronephrosis.  Left Kidney:  Measures 10.7 cm length in length and likewise appears normal.  Bladder:  Right ureteral jet visualized.  Left ureteral jet was not well seen.  IMPRESSION:  1.  Normal sonographic appearance the kidneys. 2.  In the urinary bladder, the left ureteral jet was not visualized.  This is likely incidental given the fact that there was no left hydronephrosis to suggest obstruction.   Original Report Authenticated By: Dellia Cloud, M.D.     Lab Results: Basic Metabolic Panel: No results found for this basename: NA:2,K:2,CL:2,CO2:2,GLUCOSE:2,BUN:2,CREATININE:2,CALCIUM:2,MG:2,PHOS:2 in the last 72 hours Liver Function Tests: No results found for this basename: AST:2,ALT:2,ALKPHOS:2,BILITOT:2,PROT:2,ALBUMIN:2 in the last 72 hours   CBC: No results found for this basename: WBC:2,NEUTROABS:2,HGB:2,HCT:2,MCV:2,PLT:2 in the last 72 hours  Recent Results (from the past 240 hour(s))  URINE CULTURE     Status: Normal   Collection Time   10/16/12  1:30 PM      Component Value Range Status Comment   Specimen Description URINE, RANDOM   Final    Special Requests azithromycin   Final    Culture  Setup Time 10/16/2012 22:23   Final    Colony Count NO GROWTH   Final    Culture NO GROWTH   Final    Report Status 10/17/2012 FINAL   Final      Hospital Course: She came to the hospital with back pain. She was found to have elevation of her creatinine level. This was not felt to be related to her back pain. She  underwent nephrology consultation and had ultrasound done and she did not have hydronephrosis. Her renal function improved slightly it was felt that she had acute on chronic renal failure but that she could be discharged home because she did not need any further inpatient testing.  Discharge Exam: Blood pressure 126/83, pulse 69, temperature 98.2 F (36.8 C), temperature source Oral, resp. rate 20, height 5\' 3"  (1.6 m), weight 71.668 kg (158 lb), last menstrual period 12/29/1993, SpO2 99.00%. She is anxious. Her chest is clear. Her heart is regular. She continues to complain of back pain. Her abdomen is soft  Disposition: Home. She will have followup in my office with the nephrologist and with her oncologist      Discharge Orders    Future Appointments: Provider: Department: Dept Phone: Center:   01/03/2013 1:30 PM Ellouise Newer, PA [facility] [phone] None     Future Orders Please Complete By Expires   Discharge patient           Signed: Fredirick Maudlin Pager [phone]  10/23/2012, 9:35 AM

## 2012-11-23 ENCOUNTER — Other Ambulatory Visit (HOSPITAL_COMMUNITY): Payer: Self-pay | Admitting: Pulmonary Disease

## 2012-11-23 DIAGNOSIS — R609 Edema, unspecified: Secondary | ICD-10-CM

## 2012-11-23 DIAGNOSIS — R52 Pain, unspecified: Secondary | ICD-10-CM

## 2012-12-01 ENCOUNTER — Encounter (HOSPITAL_COMMUNITY): Payer: Medicare Other

## 2012-12-07 ENCOUNTER — Emergency Department (HOSPITAL_COMMUNITY): Payer: Medicare Other

## 2012-12-07 ENCOUNTER — Emergency Department (HOSPITAL_COMMUNITY)
Admission: EM | Admit: 2012-12-07 | Discharge: 2012-12-07 | Disposition: A | Discharge status: Home / Self Care | Payer: Medicare Other | Attending: Emergency Medicine | Admitting: Emergency Medicine

## 2012-12-07 ENCOUNTER — Encounter (HOSPITAL_COMMUNITY): Payer: Self-pay | Admitting: Emergency Medicine

## 2012-12-07 DIAGNOSIS — R1110 Vomiting, unspecified: Secondary | ICD-10-CM | POA: Insufficient documentation

## 2012-12-07 DIAGNOSIS — J4489 Other specified chronic obstructive pulmonary disease: Secondary | ICD-10-CM | POA: Insufficient documentation

## 2012-12-07 DIAGNOSIS — G43909 Migraine, unspecified, not intractable, without status migrainosus: Secondary | ICD-10-CM | POA: Insufficient documentation

## 2012-12-07 DIAGNOSIS — F17200 Nicotine dependence, unspecified, uncomplicated: Secondary | ICD-10-CM | POA: Insufficient documentation

## 2012-12-07 DIAGNOSIS — R319 Hematuria, unspecified: Secondary | ICD-10-CM | POA: Insufficient documentation

## 2012-12-07 DIAGNOSIS — G2581 Restless legs syndrome: Secondary | ICD-10-CM | POA: Insufficient documentation

## 2012-12-07 DIAGNOSIS — IMO0001 Reserved for inherently not codable concepts without codable children: Secondary | ICD-10-CM | POA: Insufficient documentation

## 2012-12-07 DIAGNOSIS — R109 Unspecified abdominal pain: Secondary | ICD-10-CM | POA: Insufficient documentation

## 2012-12-07 DIAGNOSIS — Z79899 Other long term (current) drug therapy: Secondary | ICD-10-CM | POA: Insufficient documentation

## 2012-12-07 DIAGNOSIS — Z8739 Personal history of other diseases of the musculoskeletal system and connective tissue: Secondary | ICD-10-CM | POA: Insufficient documentation

## 2012-12-07 DIAGNOSIS — F341 Dysthymic disorder: Secondary | ICD-10-CM | POA: Insufficient documentation

## 2012-12-07 DIAGNOSIS — K219 Gastro-esophageal reflux disease without esophagitis: Secondary | ICD-10-CM | POA: Insufficient documentation

## 2012-12-07 DIAGNOSIS — J449 Chronic obstructive pulmonary disease, unspecified: Secondary | ICD-10-CM | POA: Insufficient documentation

## 2012-12-07 DIAGNOSIS — Z87442 Personal history of urinary calculi: Secondary | ICD-10-CM | POA: Insufficient documentation

## 2012-12-07 DIAGNOSIS — R079 Chest pain, unspecified: Secondary | ICD-10-CM | POA: Insufficient documentation

## 2012-12-07 DIAGNOSIS — M5430 Sciatica, unspecified side: Secondary | ICD-10-CM | POA: Insufficient documentation

## 2012-12-07 DIAGNOSIS — Z8509 Personal history of malignant neoplasm of other digestive organs: Secondary | ICD-10-CM | POA: Insufficient documentation

## 2012-12-07 DIAGNOSIS — Z87440 Personal history of urinary (tract) infections: Secondary | ICD-10-CM | POA: Insufficient documentation

## 2012-12-07 LAB — CBC WITH DIFFERENTIAL/PLATELET
Eosinophils Absolute: 0.3 10*3/uL (ref 0.0–0.7)
Hemoglobin: 12.6 g/dL (ref 12.0–15.0)
Lymphs Abs: 2.8 10*3/uL (ref 0.7–4.0)
MCH: 31.3 pg (ref 26.0–34.0)
MCV: 88.6 fL (ref 78.0–100.0)
Monocytes Relative: 7 % (ref 3–12)
Neutrophils Relative %: 54 % (ref 43–77)
RBC: 4.02 MIL/uL (ref 3.87–5.11)

## 2012-12-07 LAB — COMPREHENSIVE METABOLIC PANEL
Alkaline Phosphatase: 65 U/L (ref 39–117)
BUN: 10 mg/dL (ref 6–23)
CO2: 32 mEq/L (ref 19–32)
Chloride: 99 mEq/L (ref 96–112)
GFR calc Af Amer: 76 mL/min — ABNORMAL LOW (ref >90)
GFR calc non Af Amer: 65 mL/min — ABNORMAL LOW (ref >90)
Glucose, Bld: 92 mg/dL (ref 70–99)
Potassium: 3.6 mEq/L (ref 3.5–5.1)
Total Bilirubin: 0.2 mg/dL — ABNORMAL LOW (ref 0.3–1.2)
Total Protein: 6.6 g/dL (ref 6.0–8.3)

## 2012-12-07 LAB — URINALYSIS, ROUTINE W REFLEX MICROSCOPIC
Bilirubin Urine: NEGATIVE
Glucose, UA: NEGATIVE mg/dL
Ketones, ur: NEGATIVE mg/dL
Leukocytes, UA: NEGATIVE
pH: 6 (ref 5.0–8.0)

## 2012-12-07 LAB — TROPONIN I: Troponin I: <0.3 ng/mL (ref <0.30)

## 2012-12-07 MED ORDER — METHYLPREDNISOLONE 4 MG PO KIT: PACK | ORAL | Status: DC

## 2012-12-07 MED ORDER — HYDROMORPHONE HCL PF 1 MG/ML IJ SOLN
1.0000 mg | Freq: Once | INTRAMUSCULAR | Status: AC
  Administered 2012-12-07: 1 mg via INTRAVENOUS
  Filled 2012-12-07: qty 1

## 2012-12-07 MED ORDER — IBUPROFEN 800 MG PO TABS: 800.0000 mg | ORAL_TABLET | Freq: Three times a day (TID) | ORAL | Status: DC

## 2012-12-07 MED ORDER — MORPHINE SULFATE 4 MG/ML IJ SOLN
4.0000 mg | Freq: Once | INTRAMUSCULAR | Status: AC
  Administered 2012-12-07: 4 mg via INTRAVENOUS
  Filled 2012-12-07: qty 1

## 2012-12-07 MED ORDER — ONDANSETRON HCL 4 MG/2ML IJ SOLN
4.0000 mg | Freq: Once | INTRAMUSCULAR | Status: AC
  Administered 2012-12-07: 4 mg via INTRAVENOUS
  Filled 2012-12-07: qty 2

## 2012-12-07 MED ORDER — OXYCODONE-ACETAMINOPHEN 5-325 MG PO TABS: 2.0000 | ORAL_TABLET | ORAL | Status: DC | PRN

## 2012-12-07 NOTE — ED Provider Notes (Signed)
History   This chart was scribed for Glynn Octave, MD by Sofie Rower, ED Scribe. The patient was seen in room [facility] and the patient's care was started at 6:59PM.     CSN: [id number]  Arrival date & time 12/07/12  1821   First MD Initiated Contact with Patient 12/07/12 1859      Chief Complaint  Patient presents with  . Back Pain    (Consider location/radiation/quality/duration/timing/severity/associated sxs/prior treatment) The history is provided by the patient. No language interpreter was used.    [name] is a 51 y.o. female , with a hx of hypertension, kidney stones, and back pain, who presents to the Emergency Department complaining of sudden, progressively worsening, back pain, located at the lower back, radiating downwards towards the LLE, onset five days ago (12/02/12).  Associated symptoms include fever (100, taken at home today), chest pain, abdominal pain, vomiting, and hematuria. The pt reports she is experiencing a radiating back pain at present, however, she does not recall any injury which prompted her pain. The pt does admit, however, that she lifts heavy objects at work on a daily basis.  The pt denies dysuria and bladder/bowel incontinence.  The pt is a current some day smoker (0.2 packs/day), however, she does not drink alcohol.   PCP is Dr. Juanetta Gosling.    Past Medical History  Diagnosis Date  . Arthritis   . COPD (chronic obstructive pulmonary disease)   . Bronchitis   . Fibromyalgia   . Interstitial cystitis   . Migraine   . Depression with anxiety   . UTI (lower urinary tract infection)   . RLS (restless legs syndrome)   . Alopecia, unspecified   . Cervicalgia   . Tobacco use disorder   . PTSD (post-traumatic stress disorder)   . IBS (irritable bowel syndrome)   . GERD (gastroesophageal reflux disease)   . Dysphagia   . PONV (postoperative nausea and vomiting)   . Shortness of breath   . Kidney stones   . Anxiety   . Carcinoid tumor of  appendix   . Cancer     carcinoid tumor of appendix    Past Surgical History  Procedure Date  . Abdominal hysterectomy   . Cholecystectomy   . Tonsillectomy   . Cystostomy w/ bladder dilation     x 52 [place]  . Esophageal dilation 01/01/2012    Procedure: ESOPHAGEAL DILATION;  Surgeon: [name] Ade, MD;  Location: [facility];  Service: Endoscopy;  Laterality: N/A;  maloney 77fr dialator  . Laparoscopic appendectomy 01/05/2012    Procedure: APPENDECTOMY LAPAROSCOPIC;  Surgeon: Dalia Heading;  Location: [facility];  Service: General;  Laterality: N/A;  . Appendectomy 01/05/2012  . Givens capsule study 01/27/2012    Procedure: GIVENS CAPSULE STUDY;  Surgeon: [name] Ade, MD;  Location: [facility];  Service: Endoscopy;  Laterality: N/A;  7:30  . Colon resection 01/30/2012    Procedure: HAND ASSISTED LAPAROSCOPIC COLON RESECTION;  Surgeon: Dalia Heading, MD;  Location: [facility];  Service: General;  Laterality: N/A;  . Partial colectomy 01/30/2012    Procedure: PARTIAL COLECTOMY;  Surgeon: Dalia Heading, MD;  Location: [facility];  Service: General;  Laterality: N/A;  . Cardiac surgery   . Abdominal surgery   . Colon surgery   . Hemicolectomy     Family History  Problem Relation Age of Onset  . Coronary artery disease Mother   . Heart attack Mother   . Pulmonary embolism Mother   .  Alcohol abuse Father   . COPD Father   . Lung disease Father   . Anesthesia problems Neg Hx   . Hypotension Neg Hx   . Malignant hyperthermia Neg Hx   . Pseudochol deficiency Neg Hx   . Other Neg Hx     History  Substance Use Topics  . Smoking status: Current Some Day Smoker -- 0.2 packs/day for 8 years    Types: Cigarettes    Last Attempt to Quit: 01/25/2012  . Smokeless tobacco: Never Used  . Alcohol Use: No    OB History    Grav Para Term Preterm Abortions TAB SAB Ect Mult Living   1 1 1       1       Review of Systems  10 Systems reviewed and all are negative for acute change except as  noted in the HPI.    Allergies  Ativan; Clarithromycin; Pseudoephedrine; Stadol; Sulfa antibiotics; Toradol; Factive; Other; Prochlorperazine edisylate; Promethazine hcl; Tofranil-pm; and Trimethobenzamide hcl  Home Medications   Current Outpatient Rx  Name  Route  Sig  Dispense  Refill  . ALBUTEROL SULFATE (2.5 MG/3ML) 0.083% IN NEBU   Nebulization   Take 2.5 mg by nebulization every 6 (six) hours as needed. Shortness of breath.         . ALPRAZOLAM 1 MG PO TABS   Oral   Take 0.5-1 mg by mouth 3 (three) times daily as needed. For anxiety and sleep         . AMPHETAMINE-DEXTROAMPHETAMINE 20 MG PO TABS   Oral   Take 10 mg by mouth every morning. For attention         . CETIRIZINE HCL 10 MG PO TABS   Oral   Take 10 mg by mouth every morning.          . CYCLOSPORINE 0.05 % OP EMUL   Both Eyes   Place 1 drop into both eyes 2 (two) times daily as needed. For dry eyes         . ESTRADIOL 0.1 MG/24HR TD PTTW   Transdermal   Place 1 patch onto the skin every 3 (three) days.         Marland Kitchen FAMOTIDINE 10 MG PO TABS   Oral   Take 10 mg by mouth 2 (two) times daily.         Marland Kitchen FLUOXETINE HCL 40 MG PO CAPS   Oral   Take 40 mg by mouth every morning.          Marland Kitchen ONDANSETRON HCL 4 MG PO TABS   Oral   Take 4 mg by mouth every 8 (eight) hours as needed. For nausea         . OXYCODONE HCL ER 10 MG PO TB12   Oral   Take 10 mg by mouth every 12 (twelve) hours as needed. Pain.         Marland Kitchen PRAMIPEXOLE DIHYDROCHLORIDE 0.5 MG PO TABS   Oral   Take 0.5 mg by mouth at bedtime.          . TOPIRAMATE 50 MG PO TABS   Oral   Take 50 mg by mouth at bedtime.          . TRIAMTERENE-HCTZ 75-50 MG PO TABS   Oral   Take 1 tablet by mouth daily.         . VENTOLIN HFA 108 (90 BASE) MCG/ACT IN AERS   Inhalation   Inhale 2 puffs into  the lungs every 6 (six) hours as needed. For shortness of breath           BP 151/79  Pulse 79  Temp 98.8 F (37.1 C) (Oral)  Resp  16  Ht 5\' 3"  (1.6 m)  Wt 158 lb (71.668 kg)  BMI 27.99 kg/m2  SpO2 96%  LMP 12/29/1993  Physical Exam  Nursing note and vitals reviewed. Constitutional: She is oriented to person, place, and time. She appears well-developed and well-nourished. No distress.  HENT:  Head: Atraumatic.  Nose: Nose normal.  Mouth/Throat: Oropharynx is clear and moist.  Eyes: Conjunctivae normal and EOM are normal.  Neck: Normal range of motion. Neck supple.  Cardiovascular: Normal rate, regular rhythm and normal heart sounds.   Pulmonary/Chest: Effort normal and breath sounds normal. No respiratory distress.  Abdominal: Soft. Bowel sounds are normal. There is no tenderness.  Musculoskeletal: Normal range of motion.       Right perispinal lumbar pain. Left SI joint pain. 5/5 strength in bilateral lower extremities. Ankle plantar and dorsiflexion intact. Great toe extension intact bilaterally. +2 DP and PT pulses. +2 patellar reflexes bilaterally. Normal gait.   Neurological: She is alert and oriented to person, place, and time.  Skin: Skin is warm and dry. She is not diaphoretic.  Psychiatric: She has a normal mood and affect. Her behavior is normal.    ED Course  Procedures (including critical care time)  DIAGNOSTIC STUDIES: Oxygen Saturation is 96% on room air, adequate by my interpretation.    COORDINATION OF CARE:  7:24 PM- Treatment plan discussed with patient. Pt agrees with treatment.  9:14 PM-Recheck. Treatment plan discussed with patient. Pt agrees with treatment.        Results for orders placed during the hospital encounter of 12/07/12  URINALYSIS, ROUTINE W REFLEX MICROSCOPIC      Component Value Range   Color, Urine YELLOW  YELLOW   APPearance HAZY (*) CLEAR   Specific Gravity, Urine 1.020  1.005 - 1.030   pH 6.0  5.0 - 8.0   Glucose, UA NEGATIVE  NEGATIVE mg/dL   Hgb urine dipstick LARGE (*) NEGATIVE   Bilirubin Urine NEGATIVE  NEGATIVE   Ketones, ur NEGATIVE  NEGATIVE  mg/dL   Protein, ur NEGATIVE  NEGATIVE mg/dL   Urobilinogen, UA 0.2  0.0 - 1.0 mg/dL   Nitrite NEGATIVE  NEGATIVE   Leukocytes, UA NEGATIVE  NEGATIVE  PREGNANCY, URINE      Component Value Range   Preg Test, Ur NEGATIVE  NEGATIVE  CBC WITH DIFFERENTIAL      Component Value Range   WBC 8.0  4.0 - 10.5 K/uL   RBC 4.02  3.87 - 5.11 MIL/uL   Hemoglobin 12.6  12.0 - 15.0 g/dL   HCT 16.1 (*) 09.6 - 04.5 %   MCV 88.6  78.0 - 100.0 fL   MCH 31.3  26.0 - 34.0 pg   MCHC 35.4  30.0 - 36.0 g/dL   RDW 40.9  81.1 - 91.4 %   Platelets 320  150 - 400 K/uL   Neutrophils Relative 54  43 - 77 %   Neutro Abs 4.3  1.7 - 7.7 K/uL   Lymphocytes Relative 35  12 - 46 %   Lymphs Abs 2.8  0.7 - 4.0 K/uL   Monocytes Relative 7  3 - 12 %   Monocytes Absolute 0.6  0.1 - 1.0 K/uL   Eosinophils Relative 4  0 - 5 %   Eosinophils Absolute  0.3  0.0 - 0.7 K/uL   Basophils Relative 0  0 - 1 %   Basophils Absolute 0.0  0.0 - 0.1 K/uL  COMPREHENSIVE METABOLIC PANEL      Component Value Range   Sodium 138  135 - 145 mEq/L   Potassium 3.6  3.5 - 5.1 mEq/L   Chloride 99  96 - 112 mEq/L   CO2 32  19 - 32 mEq/L   Glucose, Bld 92  70 - 99 mg/dL   BUN 10  6 - 23 mg/dL   Creatinine, Ser 5.40  0.50 - 1.10 mg/dL   Calcium 9.1  8.4 - 98.1 mg/dL   Total Protein 6.6  6.0 - 8.3 g/dL   Albumin 3.8  3.5 - 5.2 g/dL   AST 17  0 - 37 U/L   ALT 17  0 - 35 U/L   Alkaline Phosphatase 65  39 - 117 U/L   Total Bilirubin 0.2 (*) 0.3 - 1.2 mg/dL   GFR calc non Af Amer 65 (*) >90 mL/min   GFR calc Af Amer 76 (*) >90 mL/min  TROPONIN I      Component Value Range   Troponin I <0.30  <0.30 ng/mL  URINE MICROSCOPIC-ADD ON      Component Value Range   Squamous Epithelial / LPF FEW (*) RARE   WBC, UA 0-2  <3 WBC/hpf   RBC / HPF TOO NUMEROUS TO COUNT  <3 RBC/hpf   Bacteria, UA RARE  RARE   Ct Abdomen Pelvis Wo Contrast  12/07/2012  *RADIOLOGY REPORT*  Clinical Data: Back pain, past history of COPD, appendectomy for carcinoid  tumor of the appendix on 01/04/2011  CT ABDOMEN AND PELVIS WITHOUT CONTRAST  Technique:  Multidetector CT imaging of the abdomen and pelvis was performed following the standard protocol without intravenous contrast. Sagittal and coronal MPR images reconstructed from axial data set.  Comparison: 10/16/2012  Findings: Lung bases clear. Gallbladder and uterus surgically absent. Within limits of a nonenhanced exam, no focal abnormalities of the liver, spleen, pancreas, kidneys or adrenal glands. No hydronephrosis, ureteral calcification or ureteral dilatation. Post right colon resection. Stomach and bowel loops otherwise normal appearance for technique. No mass, adenopathy, free fluid or inflammatory process. No hernia or acute bony lesion. Numerous pelvic phleboliths noted.  IMPRESSION: No acute intra abdominal or intrapelvic abnormalities.   Original Report Authenticated By: Ulyses Southward, M.D.    Dg Chest 2 View  12/07/2012  *RADIOLOGY REPORT*  Clinical Data: Chest pain, cough  CHEST - 2 VIEW  Comparison: CT chest dated 05/06/2012  Findings: Lungs are clear. No pleural effusion or pneumothorax.  Small right upper lobe nodule on prior CT is not radiographically apparent.  Cardiomediastinal silhouette is within normal limits.  Visualized osseous structures are within normal limits.  Cholecystectomy clips.  IMPRESSION: No evidence of acute cardiopulmonary disease.   Original Report Authenticated By: Charline Bills, M.D.       No diagnosis found.    MDM  5 days of left low back pain radiating into LLE with hematuria, nausea, chest tightness and history of interstitial cystitis.  No focal weakness, numbness, tingling.  NO incontinence. No documented fever or IVDU.  Hematuria in UA without infection. NO stone on CT.  Back pain consistent with sciatica. No neuro deficits or other red flags. Chest tightness constant all day and atypical for ACS.  EKG nonischemic.  Will treat supportively for sciatica with  NSAIDs, analgesics, and steroids.     Date: 12/07/2012  Rate: 70  Rhythm: normal sinus rhythm  QRS Axis: normal  Intervals: normal  ST/T Wave abnormalities: normal  Conduction Disutrbances:none  Narrative Interpretation:   Old EKG Reviewed: none available    I personally performed the services described in this documentation, which was scribed in my presence. The recorded information has been reviewed and is accurate.    Glynn Octave, MD 12/08/12 [phone]

## 2012-12-07 NOTE — ED Notes (Signed)
Pt c/o lower back that radiates down left leg x 1 week, pain in right kidney, blood in urine, N/V, and chest tightness.

## 2012-12-07 NOTE — ED Notes (Signed)
Pt drank of water with no N/V. Pt ambulated 66ft to bathroom with no difficulties.

## 2012-12-29 HISTORY — PX: NECK SURGERY: SHX720

## 2012-12-29 NOTE — Progress Notes (Signed)
UR Chart Review Completed  

## 2012-12-30 ENCOUNTER — Encounter: Payer: Self-pay | Admitting: Internal Medicine

## 2013-01-03 ENCOUNTER — Ambulatory Visit: Payer: Medicare Other | Admitting: Gastroenterology

## 2013-01-03 ENCOUNTER — Ambulatory Visit (HOSPITAL_COMMUNITY): Payer: Medicare Other | Admitting: Oncology

## 2013-01-06 ENCOUNTER — Ambulatory Visit (HOSPITAL_COMMUNITY): Payer: Medicare Other | Admitting: Oncology

## 2013-01-06 ENCOUNTER — Encounter (HOSPITAL_COMMUNITY): Payer: Self-pay | Admitting: Oncology

## 2013-01-08 ENCOUNTER — Encounter (HOSPITAL_COMMUNITY): Payer: Self-pay | Admitting: *Deleted

## 2013-01-08 ENCOUNTER — Emergency Department (HOSPITAL_COMMUNITY): Payer: Medicare PPO

## 2013-01-08 ENCOUNTER — Emergency Department (HOSPITAL_COMMUNITY)
Admission: EM | Admit: 2013-01-08 | Discharge: 2013-01-08 | Disposition: A | Discharge status: Home / Self Care | Payer: Medicare PPO | Attending: Emergency Medicine | Admitting: Emergency Medicine

## 2013-01-08 DIAGNOSIS — Z87440 Personal history of urinary (tract) infections: Secondary | ICD-10-CM | POA: Insufficient documentation

## 2013-01-08 DIAGNOSIS — Z87448 Personal history of other diseases of urinary system: Secondary | ICD-10-CM | POA: Insufficient documentation

## 2013-01-08 DIAGNOSIS — Z87442 Personal history of urinary calculi: Secondary | ICD-10-CM | POA: Insufficient documentation

## 2013-01-08 DIAGNOSIS — J449 Chronic obstructive pulmonary disease, unspecified: Secondary | ICD-10-CM | POA: Insufficient documentation

## 2013-01-08 DIAGNOSIS — Z8709 Personal history of other diseases of the respiratory system: Secondary | ICD-10-CM | POA: Insufficient documentation

## 2013-01-08 DIAGNOSIS — R197 Diarrhea, unspecified: Secondary | ICD-10-CM | POA: Insufficient documentation

## 2013-01-08 DIAGNOSIS — Z8679 Personal history of other diseases of the circulatory system: Secondary | ICD-10-CM | POA: Insufficient documentation

## 2013-01-08 DIAGNOSIS — Z79899 Other long term (current) drug therapy: Secondary | ICD-10-CM | POA: Insufficient documentation

## 2013-01-08 DIAGNOSIS — K589 Irritable bowel syndrome without diarrhea: Secondary | ICD-10-CM | POA: Insufficient documentation

## 2013-01-08 DIAGNOSIS — J4489 Other specified chronic obstructive pulmonary disease: Secondary | ICD-10-CM | POA: Insufficient documentation

## 2013-01-08 DIAGNOSIS — R110 Nausea: Secondary | ICD-10-CM | POA: Insufficient documentation

## 2013-01-08 DIAGNOSIS — Z8659 Personal history of other mental and behavioral disorders: Secondary | ICD-10-CM | POA: Insufficient documentation

## 2013-01-08 DIAGNOSIS — Z8719 Personal history of other diseases of the digestive system: Secondary | ICD-10-CM | POA: Insufficient documentation

## 2013-01-08 DIAGNOSIS — F17200 Nicotine dependence, unspecified, uncomplicated: Secondary | ICD-10-CM | POA: Insufficient documentation

## 2013-01-08 DIAGNOSIS — R319 Hematuria, unspecified: Secondary | ICD-10-CM

## 2013-01-08 DIAGNOSIS — IMO0001 Reserved for inherently not codable concepts without codable children: Secondary | ICD-10-CM | POA: Insufficient documentation

## 2013-01-08 DIAGNOSIS — Z8739 Personal history of other diseases of the musculoskeletal system and connective tissue: Secondary | ICD-10-CM | POA: Insufficient documentation

## 2013-01-08 DIAGNOSIS — R5381 Other malaise: Secondary | ICD-10-CM | POA: Insufficient documentation

## 2013-01-08 DIAGNOSIS — Z8589 Personal history of malignant neoplasm of other organs and systems: Secondary | ICD-10-CM | POA: Insufficient documentation

## 2013-01-08 DIAGNOSIS — Z87898 Personal history of other specified conditions: Secondary | ICD-10-CM | POA: Insufficient documentation

## 2013-01-08 LAB — URINE MICROSCOPIC-ADD ON

## 2013-01-08 LAB — COMPREHENSIVE METABOLIC PANEL
BUN: 9 mg/dL (ref 6–23)
CO2: 28 mEq/L (ref 19–32)
Calcium: 8.2 mg/dL — ABNORMAL LOW (ref 8.4–10.5)
Creatinine, Ser: 1.11 mg/dL — ABNORMAL HIGH (ref 0.50–1.10)
GFR calc Af Amer: 66 mL/min — ABNORMAL LOW (ref >90)
GFR calc non Af Amer: 57 mL/min — ABNORMAL LOW (ref >90)
Glucose, Bld: 108 mg/dL — ABNORMAL HIGH (ref 70–99)

## 2013-01-08 LAB — LIPASE, BLOOD: Lipase: 20 U/L (ref 11–59)

## 2013-01-08 LAB — CBC WITH DIFFERENTIAL/PLATELET
Eosinophils Absolute: 0.3 10*3/uL (ref 0.0–0.7)
Eosinophils Relative: 3 % (ref 0–5)
HCT: 34.3 % — ABNORMAL LOW (ref 36.0–46.0)
Lymphocytes Relative: 26 % (ref 12–46)
Lymphs Abs: 2.9 10*3/uL (ref 0.7–4.0)
MCH: 31.5 pg (ref 26.0–34.0)
MCV: 90 fL (ref 78.0–100.0)
Monocytes Absolute: 0.7 10*3/uL (ref 0.1–1.0)
RBC: 3.81 MIL/uL — ABNORMAL LOW (ref 3.87–5.11)
WBC: 11.3 10*3/uL — ABNORMAL HIGH (ref 4.0–10.5)

## 2013-01-08 LAB — INFLUENZA PANEL BY PCR (TYPE A & B): Influenza A By PCR: NEGATIVE

## 2013-01-08 LAB — URINALYSIS, ROUTINE W REFLEX MICROSCOPIC
Leukocytes, UA: NEGATIVE
Nitrite: NEGATIVE
Protein, ur: NEGATIVE mg/dL
Urobilinogen, UA: 0.2 mg/dL (ref 0.0–1.0)

## 2013-01-08 MED ORDER — MOXIFLOXACIN HCL 400 MG PO TABS: 400.0000 mg | ORAL_TABLET | Freq: Every day | ORAL | Status: DC

## 2013-01-08 MED ORDER — KETOROLAC TROMETHAMINE 30 MG/ML IJ SOLN
30.0000 mg | Freq: Once | INTRAMUSCULAR | Status: DC
  Filled 2013-01-08: qty 1

## 2013-01-08 MED ORDER — LEVOFLOXACIN IN D5W 750 MG/150ML IV SOLN
750.0000 mg | INTRAVENOUS | Status: DC
  Administered 2013-01-08: 750 mg via INTRAVENOUS
  Filled 2013-01-08: qty 150

## 2013-01-08 MED ORDER — DIPHENHYDRAMINE HCL 50 MG/ML IJ SOLN
25.0000 mg | Freq: Once | INTRAMUSCULAR | Status: AC
  Administered 2013-01-08: 25 mg via INTRAVENOUS
  Filled 2013-01-08: qty 1

## 2013-01-08 MED ORDER — DIPHENHYDRAMINE HCL 50 MG/ML IJ SOLN: 25.0000 mg | Freq: Once | INTRAMUSCULAR | Status: DC

## 2013-01-08 MED ORDER — HYDROMORPHONE HCL PF 1 MG/ML IJ SOLN
0.5000 mg | Freq: Once | INTRAMUSCULAR | Status: AC
  Administered 2013-01-08: 0.5 mg via INTRAVENOUS
  Filled 2013-01-08: qty 1

## 2013-01-08 MED ORDER — SODIUM CHLORIDE 0.9 % IV BOLUS (SEPSIS)
1000.0000 mL | Freq: Once | INTRAVENOUS | Status: AC
  Administered 2013-01-08: 1000 mL via INTRAVENOUS

## 2013-01-08 MED ORDER — HYDROMORPHONE HCL PF 1 MG/ML IJ SOLN
1.0000 mg | Freq: Once | INTRAMUSCULAR | Status: AC
  Administered 2013-01-08: 1 mg via INTRAVENOUS
  Filled 2013-01-08: qty 1

## 2013-01-08 NOTE — ED Notes (Signed)
Pt seen at PCP office on 7th for fever, coughing, SOB, and neck pain, placed on Levaquin, no improvements, having abdominal pains and slimy oily stools, only hurts after eating.

## 2013-01-08 NOTE — ED Provider Notes (Signed)
History     CSN: [id number]  Arrival date & time 01/08/13  1554   First MD Initiated Contact with Patient 01/08/13 1612      Chief Complaint  Patient presents with  . Abdominal Pain  . Generalized Body Aches    (Consider location/radiation/quality/duration/timing/severity/associated sxs/prior treatment) Patient is a 52 y.o. female presenting with weakness. The history is provided by the patient (pt complains of cough and fatique). No language interpreter was used.  Weakness Primary symptoms do not include headaches or seizures. The symptoms began more than 1 week ago. The symptoms are unchanged. The neurological symptoms are diffuse. Context: nothing.  Additional symptoms include weakness. Additional symptoms do not include hallucinations. Medical issues do not include seizures. Workup history does not include cardiac workup.    Past Medical History  Diagnosis Date  . Arthritis   . COPD (chronic obstructive pulmonary disease)   . Bronchitis   . Fibromyalgia   . Interstitial cystitis   . Migraine   . Depression with anxiety   . UTI (lower urinary tract infection)   . RLS (restless legs syndrome)   . Alopecia, unspecified   . Cervicalgia   . Tobacco use disorder   . PTSD (post-traumatic stress disorder)   . IBS (irritable bowel syndrome)   . GERD (gastroesophageal reflux disease)   . Dysphagia   . PONV (postoperative nausea and vomiting)   . Shortness of breath   . Kidney stones   . Anxiety   . Carcinoid tumor of appendix   . Cancer     carcinoid tumor of appendix    Past Surgical History  Procedure Date  . Abdominal hysterectomy   . Cholecystectomy   . Tonsillectomy   . Cystostomy w/ bladder dilation     x 52 [place]  . Esophageal dilation 01/01/2012    RMR: Normal esophagus - status post dilation  . Laparoscopic appendectomy 01/05/2012    Procedure: APPENDECTOMY LAPAROSCOPIC;  Surgeon: Dalia Heading;  Location: [facility];  Service: General;  Laterality: N/A;    . Appendectomy 01/05/2012  . Givens capsule study 01/27/2012    Procedure: GIVENS CAPSULE STUDY;  Surgeon: [name] Ade, MD;  Location: [facility];  Service: Endoscopy;  Laterality: N/A;  7:30  . Colon resection 01/30/2012    Procedure: HAND ASSISTED LAPAROSCOPIC COLON RESECTION;  Surgeon: Dalia Heading, MD;  Location: [facility];  Service: General;  Laterality: N/A;  . Partial colectomy 01/30/2012    Procedure: PARTIAL COLECTOMY;  Surgeon: Dalia Heading, MD;  Location: [facility];  Service: General;  Laterality: N/A;  . Cardiac surgery   . Abdominal surgery   . Colon surgery   . Hemicolectomy   . Ileo-colonoscopy 01/01/12    WUJ:[id number] colonic polyps removed as described above/Biopsies taken rule out microscopic colitis.    Family History  Problem Relation Age of Onset  . Coronary artery disease Mother   . Heart attack Mother   . Pulmonary embolism Mother   . Alcohol abuse Father   . COPD Father   . Lung disease Father   . Anesthesia problems Neg Hx   . Hypotension Neg Hx   . Malignant hyperthermia Neg Hx   . Pseudochol deficiency Neg Hx   . Other Neg Hx     History  Substance Use Topics  . Smoking status: Current Some Day Smoker -- 0.2 packs/day for 8 years    Types: Cigarettes    Last Attempt to Quit: 01/25/2012  . Smokeless tobacco:  Never Used  . Alcohol Use: No    OB History    Grav Para Term Preterm Abortions TAB SAB Ect Mult Living   1 1 1       1       Review of Systems  Constitutional: Negative for fatigue.  HENT: Negative for congestion, sinus pressure and ear discharge.   Eyes: Negative for discharge.  Respiratory: Positive for cough.   Cardiovascular: Negative for chest pain.  Gastrointestinal: Negative for abdominal pain and diarrhea.  Genitourinary: Negative for frequency and hematuria.  Musculoskeletal: Negative for back pain.  Skin: Negative for rash.  Neurological: Positive for weakness. Negative for seizures and headaches.  Hematological: Negative.    Psychiatric/Behavioral: Negative for hallucinations.    Allergies  Ativan; Clarithromycin; Ibuprofen; Pseudoephedrine; Stadol; Sulfa antibiotics; Toradol; Factive; Other; Prochlorperazine edisylate; Promethazine hcl; Tofranil-pm; and Trimethobenzamide hcl  Home Medications   Current Outpatient Rx  Name  Route  Sig  Dispense  Refill  . ALBUTEROL SULFATE (2.5 MG/3ML) 0.083% IN NEBU   Nebulization   Take 2.5 mg by nebulization every 6 (six) hours as needed. Shortness of breath.         . ALPRAZOLAM 1 MG PO TABS   Oral   Take 1 mg by mouth at bedtime. For anxiety and sleep         . AMLODIPINE BESYLATE 2.5 MG PO TABS   Oral   Take 2.5 mg by mouth daily.         . AMPHETAMINE-DEXTROAMPHETAMINE 20 MG PO TABS   Oral   Take 10 mg by mouth 2 (two) times daily. For attention         . CETIRIZINE HCL 10 MG PO TABS   Oral   Take 10 mg by mouth every morning.          . CYCLOSPORINE 0.05 % OP EMUL   Both Eyes   Place 1 drop into both eyes 2 (two) times daily as needed. For dry eyes         . ESTRADIOL 0.075 MG/24HR TD PTTW   Transdermal   Place 2 patches onto the skin 2 (two) times a week.         Marland Kitchen FLUOXETINE HCL 40 MG PO CAPS   Oral   Take 40 mg by mouth every morning.          . OXYCODONE HCL ER 10 MG PO TB12   Oral   Take 10 mg by mouth every 6 (six) hours as needed. Pain.         Marland Kitchen PRAMIPEXOLE DIHYDROCHLORIDE 0.5 MG PO TABS   Oral   Take 0.5 mg by mouth at bedtime.          . VENTOLIN HFA 108 (90 BASE) MCG/ACT IN AERS   Inhalation   Inhale 2 puffs into the lungs every 6 (six) hours as needed. For shortness of breath         . MOXIFLOXACIN HCL 400 MG PO TABS   Oral   Take 1 tablet (400 mg total) by mouth daily.   7 tablet   0   . ONDANSETRON HCL 4 MG PO TABS   Oral   Take 4 mg by mouth every 8 (eight) hours as needed. For nausea         . TOPIRAMATE 50 MG PO TABS   Oral   Take 50 mg by mouth at bedtime.          Hansel Feinstein  75-50 MG PO TABS   Oral   Take 1 tablet by mouth daily.           BP 128/84  Pulse 76  Temp 98.1 F (36.7 C) (Oral)  Resp 19  Ht 5\' 2"  (1.575 m)  Wt 160 lb (72.576 kg)  BMI 29.26 kg/m2  SpO2 99%  LMP 12/29/1993  Physical Exam  Constitutional: She is oriented to person, place, and time. She appears well-developed.  HENT:  Head: Normocephalic and atraumatic.  Eyes: Conjunctivae normal and EOM are normal. No scleral icterus.  Neck: Neck supple. No thyromegaly present.  Cardiovascular: Normal rate and regular rhythm.  Exam reveals no gallop and no friction rub.   No murmur heard. Pulmonary/Chest: No stridor. She has no wheezes. She has no rales. She exhibits no tenderness.  Abdominal: She exhibits no distension. There is no tenderness. There is no rebound.  Musculoskeletal: Normal range of motion. She exhibits no edema.  Lymphadenopathy:    She has no cervical adenopathy.  Neurological: She is oriented to person, place, and time. Coordination normal.  Skin: No rash noted. No erythema.  Psychiatric: She has a normal mood and affect. Her behavior is normal.    ED Course  Procedures (including critical care time)  Labs Reviewed  CBC WITH DIFFERENTIAL - Abnormal; Notable for the following:    WBC 11.3 (*)     RBC 3.81 (*)     HCT 34.3 (*)     All other components within normal limits  COMPREHENSIVE METABOLIC PANEL - Abnormal; Notable for the following:    Glucose, Bld 108 (*)     Creatinine, Ser 1.11 (*)     Calcium 8.2 (*)     Albumin 3.4 (*)     Total Bilirubin 0.1 (*)     GFR calc non Af Amer 57 (*)     GFR calc Af Amer 66 (*)     All other components within normal limits  URINALYSIS, ROUTINE W REFLEX MICROSCOPIC - Abnormal; Notable for the following:    pH 8.5 (*)     Hgb urine dipstick LARGE (*)     All other components within normal limits  URINE MICROSCOPIC-ADD ON - Abnormal; Notable for the following:    Squamous Epithelial / LPF MANY (*)      All other components within normal limits  LIPASE, BLOOD  INFLUENZA PANEL BY PCR  URINE CULTURE   Dg Chest 2 View  01/08/2013  *RADIOLOGY REPORT*  Clinical Data: 52 year old female cough weakness and fever.  CHEST - 2 VIEW  Comparison: 12/07/2012 and earlier.  Findings: Continued low and slightly lower lung volumes.  Cardiac size and mediastinal contours are within normal limits.  Visualized tracheal air column is within normal limits.  No pneumothorax, pulmonary edema, pleural effusion or consolidation.  Increased bibasilar opacity, but most resembles atelectasis.  Right upper quadrant surgical clip. Stable visualized osseous structures.  IMPRESSION: Lower lung volumes.  Increased bibasilar opacity but favor atelectasis over bibasilar infection.   Original Report Authenticated By: Erskine Speed, M.D.      1. Hematuria       MDM  Pt will follow up with md and have urine culture results reviewed.  Will place pt on avelox for respiratory symptons        Benny Lennert, MD 01/08/13 1956

## 2013-01-08 NOTE — ED Notes (Addendum)
Pt states she has been sick for past 4 weeks with cough, fever, body aches, congestion, and weakness. Pt was seen at PCP and rx abx for tx. Pt denies any relief. Pt also c/o epigastric pain, nausea, and diarrhea. Pt describes stool as "slimy and oily" but denies any blood in stool.

## 2013-01-09 ENCOUNTER — Emergency Department (HOSPITAL_COMMUNITY): Payer: Medicare PPO

## 2013-01-09 ENCOUNTER — Encounter (HOSPITAL_COMMUNITY): Payer: Self-pay

## 2013-01-09 ENCOUNTER — Emergency Department (HOSPITAL_COMMUNITY)
Admission: EM | Admit: 2013-01-09 | Discharge: 2013-01-10 | Disposition: A | Discharge status: Home / Self Care | Payer: Medicare PPO | Attending: Emergency Medicine | Admitting: Emergency Medicine

## 2013-01-09 DIAGNOSIS — Z87442 Personal history of urinary calculi: Secondary | ICD-10-CM | POA: Insufficient documentation

## 2013-01-09 DIAGNOSIS — M542 Cervicalgia: Secondary | ICD-10-CM | POA: Insufficient documentation

## 2013-01-09 DIAGNOSIS — M791 Myalgia, unspecified site: Secondary | ICD-10-CM

## 2013-01-09 DIAGNOSIS — Z8739 Personal history of other diseases of the musculoskeletal system and connective tissue: Secondary | ICD-10-CM | POA: Insufficient documentation

## 2013-01-09 DIAGNOSIS — R062 Wheezing: Secondary | ICD-10-CM | POA: Insufficient documentation

## 2013-01-09 DIAGNOSIS — Z8719 Personal history of other diseases of the digestive system: Secondary | ICD-10-CM | POA: Insufficient documentation

## 2013-01-09 DIAGNOSIS — Z79899 Other long term (current) drug therapy: Secondary | ICD-10-CM | POA: Insufficient documentation

## 2013-01-09 DIAGNOSIS — Z87891 Personal history of nicotine dependence: Secondary | ICD-10-CM | POA: Insufficient documentation

## 2013-01-09 DIAGNOSIS — J449 Chronic obstructive pulmonary disease, unspecified: Secondary | ICD-10-CM | POA: Insufficient documentation

## 2013-01-09 DIAGNOSIS — IMO0001 Reserved for inherently not codable concepts without codable children: Secondary | ICD-10-CM | POA: Insufficient documentation

## 2013-01-09 DIAGNOSIS — Z85038 Personal history of other malignant neoplasm of large intestine: Secondary | ICD-10-CM | POA: Insufficient documentation

## 2013-01-09 DIAGNOSIS — Z8679 Personal history of other diseases of the circulatory system: Secondary | ICD-10-CM | POA: Insufficient documentation

## 2013-01-09 DIAGNOSIS — F4310 Post-traumatic stress disorder, unspecified: Secondary | ICD-10-CM | POA: Insufficient documentation

## 2013-01-09 DIAGNOSIS — R109 Unspecified abdominal pain: Secondary | ICD-10-CM

## 2013-01-09 DIAGNOSIS — J4489 Other specified chronic obstructive pulmonary disease: Secondary | ICD-10-CM | POA: Insufficient documentation

## 2013-01-09 DIAGNOSIS — F341 Dysthymic disorder: Secondary | ICD-10-CM | POA: Insufficient documentation

## 2013-01-09 DIAGNOSIS — R51 Headache: Secondary | ICD-10-CM | POA: Insufficient documentation

## 2013-01-09 DIAGNOSIS — F17200 Nicotine dependence, unspecified, uncomplicated: Secondary | ICD-10-CM | POA: Insufficient documentation

## 2013-01-09 DIAGNOSIS — Z8709 Personal history of other diseases of the respiratory system: Secondary | ICD-10-CM | POA: Insufficient documentation

## 2013-01-09 DIAGNOSIS — R1110 Vomiting, unspecified: Secondary | ICD-10-CM | POA: Insufficient documentation

## 2013-01-09 DIAGNOSIS — Z87440 Personal history of urinary (tract) infections: Secondary | ICD-10-CM | POA: Insufficient documentation

## 2013-01-09 DIAGNOSIS — R197 Diarrhea, unspecified: Secondary | ICD-10-CM

## 2013-01-09 DIAGNOSIS — Z90710 Acquired absence of both cervix and uterus: Secondary | ICD-10-CM | POA: Insufficient documentation

## 2013-01-09 DIAGNOSIS — Z9089 Acquired absence of other organs: Secondary | ICD-10-CM | POA: Insufficient documentation

## 2013-01-09 MED ORDER — PREDNISONE 20 MG PO TABS
40.0000 mg | ORAL_TABLET | Freq: Once | ORAL | Status: AC
  Administered 2013-01-09: 40 mg via ORAL
  Filled 2013-01-09: qty 2

## 2013-01-09 MED ORDER — HYDROMORPHONE HCL PF 1 MG/ML IJ SOLN
1.0000 mg | Freq: Once | INTRAMUSCULAR | Status: AC
  Administered 2013-01-09: 1 mg via INTRAVENOUS
  Filled 2013-01-09: qty 1

## 2013-01-09 NOTE — ED Notes (Signed)
Here last night for the same. Having trouble breathing. Did 2 breathing treatments before I came here tonight per pt.

## 2013-01-09 NOTE — ED Provider Notes (Signed)
History  This chart was scribed for [name] Roller, MD by Madaline Brilliant, ED Scribe. The patient was seen in room [facility]. Patient's care was started at 2322.   CSN: [id number]  Arrival date & time 01/09/13  2302   First MD Initiated Contact with Patient 01/09/13 2322      Chief Complaint  Patient presents with  . Shortness of Breath    The history is provided by the patient and the spouse.   [name] is a 52 y.o. female who presents to the Emergency Department complaining of constant, moderate shortness of breath onset of one week ago. Patient was in [facility] ED one day ago for same symptoms and given Avalox. She has generalized body pain with pain worse at neck pain radiating to lower back and head pain. She reports constant wheezing, vomiting, and diarrhea. Symptoms have been occuring since Christmas. She has taken Levaquin, Keflex, and Z-pack without relief. Within the last week she has had equilibrium problems and states that she has been dropping objects. She states that her lymph nodes were swollen and tender for which she has been referred to an ENT. Denies watery or bloody stools.  Has had cholecystectomy, hx of cancer (not currently being treated), hx of fibromyalgia, partial colectomy, appendectomy and colon resection. States previous clinical diagnsis of rheumatiod arthritis by her primary doctor, no further workup for the patient.pt has had sick contact with mother in law (influenza).       Past Medical History  Diagnosis Date  . Arthritis   . COPD (chronic obstructive pulmonary disease)   . Bronchitis   . Fibromyalgia   . Interstitial cystitis   . Migraine   . Depression with anxiety   . UTI (lower urinary tract infection)   . RLS (restless legs syndrome)   . Alopecia, unspecified   . Cervicalgia   . Tobacco use disorder   . PTSD (post-traumatic stress disorder)   . IBS (irritable bowel syndrome)   . GERD (gastroesophageal reflux disease)   . Dysphagia   . PONV  (postoperative nausea and vomiting)   . Shortness of breath   . Kidney stones   . Anxiety   . Carcinoid tumor of appendix   . Cancer     carcinoid tumor of appendix    Past Surgical History  Procedure Date  . Abdominal hysterectomy   . Cholecystectomy   . Tonsillectomy   . Cystostomy w/ bladder dilation     x 52 [place]  . Esophageal dilation 01/01/2012    RMR: Normal esophagus - status post dilation  . Laparoscopic appendectomy 01/05/2012    Procedure: APPENDECTOMY LAPAROSCOPIC;  Surgeon: Dalia Heading;  Location: [facility];  Service: General;  Laterality: N/A;  . Appendectomy 01/05/2012  . Givens capsule study 01/27/2012    Procedure: GIVENS CAPSULE STUDY;  Surgeon: [name] Ade, MD;  Location: [facility];  Service: Endoscopy;  Laterality: N/A;  7:30  . Colon resection 01/30/2012    Procedure: HAND ASSISTED LAPAROSCOPIC COLON RESECTION;  Surgeon: Dalia Heading, MD;  Location: [facility];  Service: General;  Laterality: N/A;  . Partial colectomy 01/30/2012    Procedure: PARTIAL COLECTOMY;  Surgeon: Dalia Heading, MD;  Location: [facility];  Service: General;  Laterality: N/A;  . Cardiac surgery   . Abdominal surgery   . Colon surgery   . Hemicolectomy   . Ileo-colonoscopy 01/01/12    WUJ:WJXBJYNW colonic polyps removed as described above/Biopsies taken rule out microscopic colitis.  Family History  Problem Relation Age of Onset  . Coronary artery disease Mother   . Heart attack Mother   . Pulmonary embolism Mother   . Alcohol abuse Father   . COPD Father   . Lung disease Father   . Anesthesia problems Neg Hx   . Hypotension Neg Hx   . Malignant hyperthermia Neg Hx   . Pseudochol deficiency Neg Hx   . Other Neg Hx     History  Substance Use Topics  . Smoking status: Current Some Day Smoker -- 0.2 packs/day for 8 years    Types: Cigarettes    Last Attempt to Quit: 01/25/2012  . Smokeless tobacco: Never Used  . Alcohol Use: No    OB History    Grav Para Term Preterm  Abortions TAB SAB Ect Mult Living   1 1 1       1       Review of Systems  Constitutional: Negative for fever and chills.  HENT: Positive for neck pain.   Respiratory: Positive for shortness of breath.   Musculoskeletal: Positive for back pain.  Neurological: Positive for headaches.  All other systems reviewed and are negative.    Allergies  Ativan; Clarithromycin; Ibuprofen; Pseudoephedrine; Stadol; Sulfa antibiotics; Toradol; Factive; Other; Prochlorperazine edisylate; Promethazine hcl; Tofranil-pm; and Trimethobenzamide hcl  Home Medications   Current Outpatient Rx  Name  Route  Sig  Dispense  Refill  . ALBUTEROL SULFATE (2.5 MG/3ML) 0.083% IN NEBU   Nebulization   Take 2.5 mg by nebulization every 6 (six) hours as needed. Shortness of breath.         . ALPRAZOLAM 1 MG PO TABS   Oral   Take 1 mg by mouth at bedtime. For anxiety and sleep         . AMLODIPINE BESYLATE 2.5 MG PO TABS   Oral   Take 2.5 mg by mouth daily.         . AMPHETAMINE-DEXTROAMPHETAMINE 20 MG PO TABS   Oral   Take 10 mg by mouth 2 (two) times daily. For attention         . CETIRIZINE HCL 10 MG PO TABS   Oral   Take 10 mg by mouth every morning.          . CYCLOSPORINE 0.05 % OP EMUL   Both Eyes   Place 1 drop into both eyes 2 (two) times daily as needed. For dry eyes         . ESTRADIOL 0.075 MG/24HR TD PTTW   Transdermal   Place 2 patches onto the skin 2 (two) times a week.         Marland Kitchen FLUOXETINE HCL 40 MG PO CAPS   Oral   Take 40 mg by mouth every morning.          Marland Kitchen MOXIFLOXACIN HCL 400 MG PO TABS   Oral   Take 1 tablet (400 mg total) by mouth daily.   7 tablet   0   . ONDANSETRON HCL 4 MG PO TABS   Oral   Take 4 mg by mouth every 8 (eight) hours as needed. For nausea         . OXYCODONE HCL ER 10 MG PO TB12   Oral   Take 10 mg by mouth every 6 (six) hours as needed. Pain.         Marland Kitchen PRAMIPEXOLE DIHYDROCHLORIDE 0.5 MG PO TABS   Oral   Take 0.5 mg by mouth  at bedtime.          . TOPIRAMATE 50 MG PO TABS   Oral   Take 50 mg by mouth at bedtime.          . TRIAMTERENE-HCTZ 75-50 MG PO TABS   Oral   Take 1 tablet by mouth daily.         . VENTOLIN HFA 108 (90 BASE) MCG/ACT IN AERS   Inhalation   Inhale 2 puffs into the lungs every 6 (six) hours as needed. For shortness of breath         . DIPHENOXYLATE-ATROPINE 2.5-0.025 MG PO TABS   Oral   Take 1 tablet by mouth 4 (four) times daily as needed for diarrhea or loose stools.   30 tablet   0   . ONDANSETRON 4 MG PO TBDP   Oral   Take 1 tablet (4 mg total) by mouth every 8 (eight) hours as needed for nausea.   10 tablet   0     Triage Vitals:  BP 154/77  Pulse 81  Temp 98.4 F (36.9 C) (Oral)  Resp 20  Ht 5\' 2"  (1.575 m)  Wt 155 lb (70.308 kg)  BMI 28.35 kg/m2  SpO2 95%  LMP 12/29/1993  Physical Exam  Nursing note and vitals reviewed. Constitutional: She appears well-developed and well-nourished.       Uncomfortable appearing  HENT:  Head: Normocephalic and atraumatic.  Mouth/Throat: Oropharynx is clear and moist.       Absence of the tonsils  Eyes: Conjunctivae normal are normal. No scleral icterus.  Neck:       Slight decreased range of motion of her neck bilaterally and with flexion  Cardiovascular: Normal rate, regular rhythm and normal heart sounds.   Pulmonary/Chest: Effort normal and breath sounds normal. No respiratory distress. She has no wheezes. She has no rales. She exhibits no tenderness.  Abdominal: Soft. Normal appearance and bowel sounds are normal. There is no splenomegaly or hepatomegaly. There is tenderness in the right lower quadrant, left upper quadrant and left lower quadrant. There is no rigidity, no rebound and no guarding.  Musculoskeletal: Normal range of motion. She exhibits no edema and no tenderness.  Neurological: She is alert. Coordination normal.       Speech is clear, mentation is normal, moving all extremities x4  Skin: Skin is  warm and dry. No rash noted. No erythema.    ED Course  Procedures (including critical care time)  DIAGNOSTIC STUDIES: Oxygen Saturation is 95% on room air, adequate by my interpretation.    COORDINATION OF CARE:  23:58 Discussed treatment with patient. Patient agrees.   Labs Reviewed  COMPREHENSIVE METABOLIC PANEL - Abnormal; Notable for the following:    Total Bilirubin 0.2 (*)     GFR calc non Af Amer 77 (*)     GFR calc Af Amer 90 (*)     All other components within normal limits  URINALYSIS, ROUTINE W REFLEX MICROSCOPIC - Abnormal; Notable for the following:    Specific Gravity, Urine >1.030 (*)     All other components within normal limits  SEDIMENTATION RATE  CBC WITH DIFFERENTIAL  CK   Dg Chest 2 View  01/08/2013  *RADIOLOGY REPORT*  Clinical Data: 52 year old female cough weakness and fever.  CHEST - 2 VIEW  Comparison: 12/07/2012 and earlier.  Findings: Continued low and slightly lower lung volumes.  Cardiac size and mediastinal contours are within normal limits.  Visualized tracheal air column is within  normal limits.  No pneumothorax, pulmonary edema, pleural effusion or consolidation.  Increased bibasilar opacity, but most resembles atelectasis.  Right upper quadrant surgical clip. Stable visualized osseous structures.  IMPRESSION: Lower lung volumes.  Increased bibasilar opacity but favor atelectasis over bibasilar infection.   Original Report Authenticated By: Erskine Speed, M.D.    Ct Head Wo Contrast  01/10/2013  *RADIOLOGY REPORT*  Clinical Data: Headache, dizziness, fever.  CT HEAD WITHOUT CONTRAST  Technique:  Contiguous axial images were obtained from the base of the skull through the vertex without contrast.  Comparison: 05/25/2012 and earlier studies  Findings: Patchy opacification of ethmoid air cells. There is no evidence of acute intracranial hemorrhage, brain edema, mass lesion, acute infarction,   mass effect, or midline shift. Acute infarct may be inapparent  on noncontrast CT.  No other intra-axial abnormalities are seen, and the ventricles and sulci are within normal limits in size and symmetry.   No abnormal extra-axial fluid collections or masses are identified.  No significant calvarial abnormality.  IMPRESSION: 1. Negative for bleed or other acute intracranial process. 2.  Ethmoid sinus disease.   Original Report Authenticated By: D. Andria Rhein, MD    Ct Abdomen Pelvis W Contrast  01/10/2013  *RADIOLOGY REPORT*  Clinical Data: Cough, fever, body aches, congestion, weakness, epigastric pain, nausea, and diarrhea.  CT ABDOMEN AND PELVIS WITH CONTRAST  Technique:  Multidetector CT imaging of the abdomen and pelvis was performed following the standard protocol during bolus administration of intravenous contrast.  Contrast: OMNIPAQUE IOHEXOL 300 MG/ML  SOLN  Comparison: 12/07/2012  Findings: The lung bases are clear. Mild thickening of the distal esophageal wall may represent reflux esophagitis.  Surgical absence of the gallbladder.  The liver, spleen, pancreas, adrenal glands, kidneys, abdominal aorta, and retroperitoneal lymph nodes are unremarkable.  The stomach, small bowel, and colon are not abnormally distended.  Diffusely stool filled colon. Postoperative changes with apparent right hemicolectomy and transverse ileocolonic anastomoses. No free air or free fluid in the abdomen.  Pelvis:  Stool filled rectosigmoid colon without diverticulitis. The bladder wall is not thickened.  The uterus is surgically absent at least partially.  No abnormal adnexal masses.  No free or loculated pelvic fluid collections.  No significant pelvic lymphadenopathy.  Normal alignment of the lumbar vertebrae.  No destructive bone lesions appreciated.  No significant change since previous study.  IMPRESSION: No acute process demonstrated in the abdomen or pelvis.  Stable appearance since previous study.   Original Report Authenticated By: Burman Nieves, M.D.      1.  Myalgia   2. Diarrhea   3. Abdominal pain       MDM  Would consider that the patient has an autoimmune problem, rhabdomyolysis, less likely to be infectious given the patient's benign appearance, prolonged symptoms, lack of fever or tachycardia. She does have abdominal tenderness which at this point there is no etiology given her history thus a CT scan will be ordered. The husband also no evidence that the patient has been falling several times over the last couple of days which is very frustrating to her, states that she loses her balance for no other reason. CT head without contrast ordered for this., Labs, steroids, pain medications, reevaluate.  Labs negative for acute findings - CT negative for acute findings - pt appears stable - will give meds for symptomatic complaints and have f/u with PMD  Sed rate negative as well.  I personally performed the services described in this documentation,  which was scribed in my presence. The recorded information has been reviewed and is accurate.         [name] Roller, MD 01/10/13 [phone]

## 2013-01-10 ENCOUNTER — Encounter (HOSPITAL_COMMUNITY): Payer: Self-pay

## 2013-01-10 LAB — CBC WITH DIFFERENTIAL/PLATELET
Basophils Relative: 0 % (ref 0–1)
Eosinophils Absolute: 0.4 10*3/uL (ref 0.0–0.7)
Eosinophils Relative: 5 % (ref 0–5)
MCH: 31.4 pg (ref 26.0–34.0)
MCHC: 35.1 g/dL (ref 30.0–36.0)
MCV: 89.7 fL (ref 78.0–100.0)
Neutrophils Relative %: 58 % (ref 43–77)
Platelets: 309 10*3/uL (ref 150–400)
RBC: 4.07 MIL/uL (ref 3.87–5.11)
RDW: 12.6 % (ref 11.5–15.5)

## 2013-01-10 LAB — URINE CULTURE: Culture: NO GROWTH

## 2013-01-10 LAB — URINALYSIS, ROUTINE W REFLEX MICROSCOPIC
Bilirubin Urine: NEGATIVE
Glucose, UA: NEGATIVE mg/dL
Ketones, ur: NEGATIVE mg/dL
Leukocytes, UA: NEGATIVE
Nitrite: NEGATIVE
Specific Gravity, Urine: >1.03 — ABNORMAL HIGH (ref 1.005–1.030)
pH: 5.5 (ref 5.0–8.0)

## 2013-01-10 LAB — COMPREHENSIVE METABOLIC PANEL
ALT: 10 U/L (ref 0–35)
Albumin: 3.5 g/dL (ref 3.5–5.2)
Alkaline Phosphatase: 64 U/L (ref 39–117)
Calcium: 9.3 mg/dL (ref 8.4–10.5)
GFR calc Af Amer: 90 mL/min — ABNORMAL LOW (ref >90)
Glucose, Bld: 97 mg/dL (ref 70–99)
Potassium: 3.7 mEq/L (ref 3.5–5.1)
Sodium: 136 mEq/L (ref 135–145)
Total Protein: 6.7 g/dL (ref 6.0–8.3)

## 2013-01-10 LAB — CK: Total CK: 87 U/L (ref 7–177)

## 2013-01-10 MED ORDER — HYDROMORPHONE HCL PF 1 MG/ML IJ SOLN
1.0000 mg | Freq: Once | INTRAMUSCULAR | Status: AC
  Administered 2013-01-10: 1 mg via INTRAVENOUS
  Filled 2013-01-10: qty 1

## 2013-01-10 MED ORDER — ONDANSETRON HCL 4 MG/2ML IJ SOLN
4.0000 mg | Freq: Once | INTRAMUSCULAR | Status: AC
  Administered 2013-01-10: 4 mg via INTRAVENOUS
  Filled 2013-01-10: qty 2

## 2013-01-10 MED ORDER — ONDANSETRON 4 MG PO TBDP: 4.0000 mg | ORAL_TABLET | Freq: Three times a day (TID) | ORAL | Status: DC | PRN

## 2013-01-10 MED ORDER — IOHEXOL 300 MG/ML  SOLN
100.0000 mL | Freq: Once | INTRAMUSCULAR | Status: AC | PRN
  Administered 2013-01-10: 100 mL via INTRAVENOUS

## 2013-01-10 MED ORDER — OXYCODONE-ACETAMINOPHEN 5-325 MG PO TABS
2.0000 | ORAL_TABLET | Freq: Once | ORAL | Status: AC
  Administered 2013-01-10: 2 via ORAL
  Filled 2013-01-10: qty 2

## 2013-01-10 MED ORDER — DIPHENOXYLATE-ATROPINE 2.5-0.025 MG PO TABS: 1.0000 | ORAL_TABLET | Freq: Four times a day (QID) | ORAL | Status: DC | PRN

## 2013-01-10 MED ORDER — DIPHENHYDRAMINE HCL 50 MG/ML IJ SOLN
25.0000 mg | Freq: Once | INTRAMUSCULAR | Status: AC
  Administered 2013-01-10: 25 mg via INTRAVENOUS
  Filled 2013-01-10: qty 1

## 2013-01-11 ENCOUNTER — Ambulatory Visit (HOSPITAL_COMMUNITY): Payer: Medicare Other | Admitting: Oncology

## 2013-01-12 ENCOUNTER — Emergency Department (HOSPITAL_COMMUNITY): Payer: Medicare PPO

## 2013-01-12 ENCOUNTER — Encounter (HOSPITAL_COMMUNITY): Payer: Self-pay | Admitting: Oncology

## 2013-01-12 ENCOUNTER — Other Ambulatory Visit: Payer: Self-pay

## 2013-01-12 ENCOUNTER — Emergency Department (HOSPITAL_COMMUNITY)
Admission: EM | Admit: 2013-01-12 | Discharge: 2013-01-12 | Disposition: A | Discharge status: Home / Self Care | Payer: Medicare PPO | Attending: Emergency Medicine | Admitting: Emergency Medicine

## 2013-01-12 ENCOUNTER — Encounter (HOSPITAL_COMMUNITY): Payer: Self-pay | Admitting: Radiology

## 2013-01-12 ENCOUNTER — Encounter (HOSPITAL_COMMUNITY): Payer: Self-pay | Admitting: *Deleted

## 2013-01-12 ENCOUNTER — Encounter (HOSPITAL_COMMUNITY): Payer: Medicare Other | Attending: Oncology | Admitting: Oncology

## 2013-01-12 DIAGNOSIS — Z8739 Personal history of other diseases of the musculoskeletal system and connective tissue: Secondary | ICD-10-CM | POA: Insufficient documentation

## 2013-01-12 DIAGNOSIS — Z87440 Personal history of urinary (tract) infections: Secondary | ICD-10-CM | POA: Insufficient documentation

## 2013-01-12 DIAGNOSIS — Z8679 Personal history of other diseases of the circulatory system: Secondary | ICD-10-CM | POA: Insufficient documentation

## 2013-01-12 DIAGNOSIS — Z87448 Personal history of other diseases of urinary system: Secondary | ICD-10-CM | POA: Insufficient documentation

## 2013-01-12 DIAGNOSIS — Z79899 Other long term (current) drug therapy: Secondary | ICD-10-CM | POA: Insufficient documentation

## 2013-01-12 DIAGNOSIS — Z8659 Personal history of other mental and behavioral disorders: Secondary | ICD-10-CM | POA: Insufficient documentation

## 2013-01-12 DIAGNOSIS — Z87898 Personal history of other specified conditions: Secondary | ICD-10-CM | POA: Insufficient documentation

## 2013-01-12 DIAGNOSIS — R05 Cough: Secondary | ICD-10-CM

## 2013-01-12 DIAGNOSIS — J4489 Other specified chronic obstructive pulmonary disease: Secondary | ICD-10-CM | POA: Insufficient documentation

## 2013-01-12 DIAGNOSIS — Z87442 Personal history of urinary calculi: Secondary | ICD-10-CM | POA: Insufficient documentation

## 2013-01-12 DIAGNOSIS — J449 Chronic obstructive pulmonary disease, unspecified: Secondary | ICD-10-CM | POA: Insufficient documentation

## 2013-01-12 DIAGNOSIS — R55 Syncope and collapse: Secondary | ICD-10-CM

## 2013-01-12 DIAGNOSIS — D3A020 Benign carcinoid tumor of the appendix: Secondary | ICD-10-CM

## 2013-01-12 DIAGNOSIS — R42 Dizziness and giddiness: Secondary | ICD-10-CM | POA: Insufficient documentation

## 2013-01-12 DIAGNOSIS — R059 Cough, unspecified: Secondary | ICD-10-CM

## 2013-01-12 DIAGNOSIS — K589 Irritable bowel syndrome without diarrhea: Secondary | ICD-10-CM | POA: Insufficient documentation

## 2013-01-12 DIAGNOSIS — R0602 Shortness of breath: Secondary | ICD-10-CM | POA: Insufficient documentation

## 2013-01-12 DIAGNOSIS — Z8589 Personal history of malignant neoplasm of other organs and systems: Secondary | ICD-10-CM | POA: Insufficient documentation

## 2013-01-12 DIAGNOSIS — Z87891 Personal history of nicotine dependence: Secondary | ICD-10-CM | POA: Insufficient documentation

## 2013-01-12 DIAGNOSIS — Z8719 Personal history of other diseases of the digestive system: Secondary | ICD-10-CM | POA: Insufficient documentation

## 2013-01-12 DIAGNOSIS — Z8709 Personal history of other diseases of the respiratory system: Secondary | ICD-10-CM | POA: Insufficient documentation

## 2013-01-12 LAB — COMPREHENSIVE METABOLIC PANEL
ALT: 13 U/L (ref 0–35)
Alkaline Phosphatase: 71 U/L (ref 39–117)
BUN: 10 mg/dL (ref 6–23)
CO2: 29 mEq/L (ref 19–32)
Chloride: 99 mEq/L (ref 96–112)
GFR calc Af Amer: 90 mL/min — ABNORMAL LOW (ref >90)
GFR calc non Af Amer: 77 mL/min — ABNORMAL LOW (ref >90)
Glucose, Bld: 94 mg/dL (ref 70–99)
Potassium: 4 mEq/L (ref 3.5–5.1)
Sodium: 138 mEq/L (ref 135–145)
Total Bilirubin: 0.3 mg/dL (ref 0.3–1.2)

## 2013-01-12 LAB — CBC WITH DIFFERENTIAL/PLATELET
Hemoglobin: 14.5 g/dL (ref 12.0–15.0)
Lymphocytes Relative: 14 % (ref 12–46)
Lymphs Abs: 1.9 10*3/uL (ref 0.7–4.0)
Monocytes Relative: 5 % (ref 3–12)
Neutrophils Relative %: 80 % — ABNORMAL HIGH (ref 43–77)
Platelets: 363 10*3/uL (ref 150–400)
RBC: 4.66 MIL/uL (ref 3.87–5.11)
WBC: 13.2 10*3/uL — ABNORMAL HIGH (ref 4.0–10.5)

## 2013-01-12 LAB — URINALYSIS, ROUTINE W REFLEX MICROSCOPIC
Glucose, UA: NEGATIVE mg/dL
Hgb urine dipstick: NEGATIVE
Specific Gravity, Urine: 1.01 (ref 1.005–1.030)
Urobilinogen, UA: 0.2 mg/dL (ref 0.0–1.0)
pH: 5.5 (ref 5.0–8.0)

## 2013-01-12 LAB — TROPONIN I: Troponin I: <0.3 ng/mL (ref <0.30)

## 2013-01-12 LAB — CK TOTAL AND CKMB (NOT AT ARMC): Relative Index: INVALID (ref 0.0–2.5)

## 2013-01-12 MED ORDER — OXYCODONE-ACETAMINOPHEN 5-325 MG PO TABS
2.0000 | ORAL_TABLET | Freq: Once | ORAL | Status: AC
  Administered 2013-01-12: 2 via ORAL
  Filled 2013-01-12: qty 2

## 2013-01-12 MED ORDER — BENZONATATE 100 MG PO CAPS: 100.0000 mg | ORAL_CAPSULE | Freq: Three times a day (TID) | ORAL | Status: DC | PRN

## 2013-01-12 MED ORDER — ONDANSETRON 8 MG PO TBDP
8.0000 mg | ORAL_TABLET | Freq: Once | ORAL | Status: AC
  Administered 2013-01-12: 8 mg via ORAL
  Filled 2013-01-12: qty 1

## 2013-01-12 MED ORDER — ONDANSETRON HCL 4 MG PO TABS: 4.0000 mg | ORAL_TABLET | Freq: Three times a day (TID) | ORAL | Status: DC | PRN

## 2013-01-12 NOTE — ED Notes (Addendum)
Pt was [facility] to see Dr Mariel Sleet and had syncopal episode, feels tired and weak.  Seen here 1/11 for weakness and cough, with flu sx. Alert, talking,

## 2013-01-12 NOTE — ED Notes (Signed)
Pt called out to nurses desk, states "feel like I cannot get enough air". Upon examination of pt, no distress noted. Oxygen saturation 97% on room air. Pulse 74, normal sinus rhythm noted on cardiac monitor.

## 2013-01-12 NOTE — Discharge Instructions (Signed)
RESOURCE GUIDE  Chronic Pain Problems: Contact [facility]  [phone] Patients need to be referred by their primary care doctor.  Insufficient Money for Medicine: Contact United Way:  call "211."   No Primary Care Doctor: - Call Health Connect  [phone] - can help you locate a primary care doctor that  accepts your insurance, provides certain services, etc. - Physician Referral Service- [phone]  Agencies that provide inexpensive medical care: - [facility]  [phone] - [facility]  [phone] - [facility]  [phone] - [facility]  [phone] - Planned Parenthood  [phone] - [facility]  [phone]  [place] Providers: - [facility]- [address], [address]  [phone], Mon-Fri 9am-7pm, Sat 9am-1pm - Quincy Lucas, [place], [address]  [phone] [facility]- [address]  [place], [address], [phone]  Only accepts [place] patients after they have their name  applied to their card  Self Pay (no insurance) in [place]: - Sickle Cell Patients: Dr Kevan Ny, [facility]  [place], [facility]- [place]  [facility]- [address], [facility]- see information above (Speak to [employer] if you do not have insurance)       -  [facility]- [place],  Ethel Crafton, [place]  Dr Vista Lawman-  [address], [place], [place], [place]       -  [facility] - [address], [phone]       -  [facility]- [address], [place],  also [address], [phone]       -    [facility]- [address], [place], 1st & 3rd Saturday        every month, 10am-1pm  1) Find a Doctor and Pay Out of Pocket Although you won't have to find out who is covered by your insurance plan, it is a good idea to ask around and get recommendations. You will then need to call the office and see if the doctor you have chosen will accept you as a new patient and what types of options they offer for patients who are self-pay. Some doctors offer discounts or will set up payment plans for their patients who do not have insurance, but you will need to ask so you aren't surprised when you get to your appointment.  2) Contact Your Local Health Department Not all health departments have doctors that can see patients for sick visits, but many do, so it is worth a call to see if yours does. If you don't know where your local health department is, you can check in your phone book. The CDC also has a tool to help you locate your state's health department, and many state websites also have listings of all of their local health departments.  3) Find a [employer]  If your illness is not likely to be very severe or complicated, you may want to try a walk in clinic. These are popping up all over the country in pharmacies, drugstores, and shopping centers. They're usually staffed by nurse practitioners or physician assistants that have been trained to treat common illnesses and complaints. They're usually fairly quick and inexpensive. However, if you have serious medical issues or chronic medical problems, these are probably not your best option  STD Testing - [facility] Department of [facility] [place], STD Clinic, [address], [place], phone [phone] or [phone].  Monday - Friday, call for an appointment. [facility] Department of [employer], STD Clinic, [place] [address], [place], phone  [phone] or [phone].  Monday - Friday, call for an appointment.  Abuse/Neglect: [facility] Child Abuse Hotline 570-468-6871 [facility] Child Abuse Hotline 3648620400 (After Hours)  Emergency Shelter:  [facility] 716-383-7903  Maternity Homes: - Room at the [place] [facility] 214-643-6464 - [facility] (816)[phone]  MRSA Hotline #:   256-691-5800  [facility] Resources [facility]  United Way [facility] Dept. [address]                 [address]         [address] [place] [address]  Blondell Reveal Phone:  [phone]                                  Phone:  [phone]                   Phone:  [phone]  [facility], [phone] - [facility]- [phone]       -     [facility] in [place], [address]             7657718461, Insurance  [place] Child Abuse Hotline (229)090-1445 or 773-108-6787 (After Hours)  Dental Assistance  If unable to pay or uninsured, contact:  [facility]. to become qualified for the adult dental clinic.  Patients with Medicaid: [facility] (231)099-6440 [address], [phone] [address] [phone]  If unable to pay, or uninsured, contact [facility] [phone] in [place], [phone] in [facility]) to become qualified for the adult dental clinic  Other Low-Cost Community Dental Services: - [facility]- [address] [place], [place], [place], [address], [phone], Ext. 123, 2nd and 4th Thursday of the month at 6:30am.  10 clients each day by appointment, can sometimes see walk-in patients if someone does not show for an appointment. [facility]- [address] Ether Griffins [place], [place],  [address], [phone] - [facility]- [address] [place], [place], [address], [phone] [facility] Health Department- [phone] - [facility]  Department- [phone] [facility] [phone]       Behavioral Health Resources in the [facility]  Intensive Outpatient Programs: [facility]      [address] [place], [place] [phone] Both a day and evening program       [facility] Outpatient     [address]        [place], [place] [address] [phone]         ADS: [facility] [address] [place] [phone]  [facility] Mental Health ACCESS LINE: 856 467 2061 or ([phone] [place], [place] [address] [email or web address]  Behavioral Health Services  Substance Abuse Resources: - [facility] Drug [facility]  (239) 470-6455 - [facility] 425-179-3652 - [facility] [place] 916-239-7744 Floydene Flock (904) 004-0894 - Residential & Outpatient Substance Abuse Program  234-767-3099  Psychological Services: [facility]  [phone] [facility] Services  2055602742 - [facility], [phone] [place]. [address] [place], ACCESS LINE: 7126476654 or 604-718-2374, [email or web address]  Mobile Crisis Teams:                                        Therapeutic Alternatives         Mobile Crisis Care Unit 2237013046             [facility] [address] Ginette Otto [phone]                                         Interventionist [address] DeEsch [address] [place] [phone]  Self-Help/Support Groups: Mental Health Assoc. of [employer] of support groups (773)187-4165 (call for more info)   Narcotics Anonymous (NA) Caring Services [address] [place] - 2 meetings at this  location  Residential Treatment Programs:  [facility]      [address]        [place]       [phone]         [facility] [address] [place] [address] [place], [place]  [address] [phone]  [facility] Treatment Facility  [address] [place], [place] [address] [phone] Admissions: 8am-3pm M-F  [facility]     [address]        [place], [place] [address]       [phone]         [facility] [address] Starling Manns [place], [place] [phone]  [facility] [address] [place], [place] [phone]  [facility] - Intensive Outpatient      [address]     [place], [place]       [phone]         [facility] (Addiction [facility]     [address] [place], [place] [phone] or 4427161896  Residential Treatment Services (RTS), Medicaid [address] [place], [place] 878-676-7209  Fellowship Margo Aye                                               [phone]  [address] [place] [phone]  [facility] Resources: [place]- (616)[phone]               General Therapy                                                Angie Fava, PhD        [address] [place], [place] [address]         [phone]   Insurance  [facility] Behavioral   [address] [place], [place] [address] [phone]  [facility] [address] [place], [place] [address] [phone] Insurance/Medicaid/sponsorship through [facility] and Families                                              [address]                                        [place], [place] [address]    Therapy/tele-psych/case         [phone]          [facility] [address], [place]  [address]  Adolescent/group home/case management [phone]                                           Creola Corn PhD       General therapy       Insurance   [phone]         Dr. Lolly Mustache, Insurance, M-F [phone]     Take the prescription as directed.  Call your regular medical doctor tomorrow morning to schedule a follow up appointment within the next 2 days.  Return to the Emergency Department immediately sooner if worsening.

## 2013-01-12 NOTE — ED Notes (Signed)
Pt admitted to being exhausted, takes care of family and in-laws, "everyone keeps taking and taking and never gives back". Pt encouraged to discuss how she feels with PCP. Pt reports has xanax but doesn't like to take it. Again encouraged to discuss how she feels with PCP, priest or some one she trusts.

## 2013-01-12 NOTE — ED Notes (Signed)
Patient ambulated in hall.  Patient denies any dizziness, stated she just felt a "little short of breath".  Patient did not appear to have any distress.  Patient did request a copy of her lab work.  Nurse notified.

## 2013-01-12 NOTE — ED Notes (Signed)
Pt reports being upset, Feels dr is not doing enough for her. Again asking for a copy of her blood work.

## 2013-01-12 NOTE — ED Provider Notes (Signed)
History     CSN: [id number]  Arrival date & time 01/12/13  1543   First MD Initiated Contact with Patient 01/12/13 1748      Chief Complaint  Patient presents with  . Loss of Consciousness    HPI Pt was seen at 1810.  Per pt, c/o sudden onset and resolution of one brief episode of syncope that occurred PTA.  Pt states she was at her Heme/Onc MD's ofc today sitting in a chair when she felt "lightheaded" and "slipped out of the chair."  States she has been feeling generalized weakness/fatigue, generalized body aches, chills and cough for the past 4 weeks, worse over the past 1 week. Describes her body aches as "my fibromyalgia."  Denies prodromal symptoms before fall, no CP/palpitations, no SOB, no fevers, no back pain, no neck pain, no headache, no visual changes, no focal motor weakness, no tingling/numbness in extremities, no abd pain, no N/V/D.  The patient has a significant history of similar symptoms previously, recently being evaluated for this complaint and multiple prior evals for same.          Past Medical History  Diagnosis Date  . Arthritis   . COPD (chronic obstructive pulmonary disease)   . Bronchitis   . Fibromyalgia   . Interstitial cystitis   . Migraine   . Depression with anxiety   . UTI (lower urinary tract infection)   . RLS (restless legs syndrome)   . Alopecia, unspecified   . Cervicalgia   . Tobacco use disorder   . PTSD (post-traumatic stress disorder)   . IBS (irritable bowel syndrome)   . GERD (gastroesophageal reflux disease)   . Dysphagia   . PONV (postoperative nausea and vomiting)   . Shortness of breath   . Anxiety   . Carcinoid tumor of appendix   . Kidney stones   . Cancer     carcinoid tumor of appendix  . 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 Reoperation for her lymph node dissection revealed all  nodes to be absolutely negative.     Past Surgical History  Procedure Date  . Abdominal hysterectomy   . Cholecystectomy   . Tonsillectomy   . Cystostomy w/ bladder dilation     x [place]  . Esophageal dilation 01/01/2012    RMR: Normal esophagus - status post dilation  . Laparoscopic appendectomy 01/05/2012    Procedure: APPENDECTOMY LAPAROSCOPIC;  Surgeon: Dalia Heading;  Location: [facility];  Service: General;  Laterality: N/A;  . Appendectomy 01/05/2012  . Givens capsule study 01/27/2012    Procedure: GIVENS CAPSULE STUDY;  Surgeon: [name] Ade, MD;  Location: [facility];  Service: Endoscopy;  Laterality: N/A;  7:30  . Colon resection 01/30/2012    Procedure: HAND ASSISTED LAPAROSCOPIC COLON RESECTION;  Surgeon: Dalia Heading, MD;  Location: [facility];  Service: General;  Laterality: N/A;  . Partial colectomy 01/30/2012    Procedure: PARTIAL COLECTOMY;  Surgeon: Dalia Heading, MD;  Location: [facility];  Service: General;  Laterality: N/A;  . Hemicolectomy   . Ileo-colonoscopy 01/01/12    WUJ:[id number] colonic polyps removed as described above/Biopsies taken rule out microscopic colitis.  . Abdominal surgery   . Colon surgery   . Cardiac surgery     Family History  Problem Relation Age of Onset  . Coronary artery disease Mother   .  Heart attack Mother   . Pulmonary embolism Mother   . Alcohol abuse Father   . COPD Father   . Lung disease Father   . Anesthesia problems Neg Hx   . Hypotension Neg Hx   . Malignant hyperthermia Neg Hx   . Pseudochol deficiency Neg Hx   . Other Neg Hx     History  Substance Use Topics  . Smoking status: Former Smoker -- 0.2 packs/day for 8 years    Types: Cigarettes    Quit date: 01/25/2012  . Smokeless tobacco: Never Used  . Alcohol Use: No    OB History    Grav Para Term Preterm Abortions TAB SAB Ect Mult Living   1 1 1       1       Review of Systems ROS: Statement: All systems negative except as marked or noted in the HPI;  Constitutional: Negative for fever and +chills, generalized weakness/fatigue/body aches. ; ; Eyes: Negative for eye pain, redness and discharge. ; ; ENMT: Negative for ear pain, hoarseness, nasal congestion, sinus pressure and sore throat. ; ; Cardiovascular: Negative for chest pain, palpitations, diaphoresis, dyspnea and peripheral edema. ; ; Respiratory: +cough. Negative for wheezing and stridor. ; ; Gastrointestinal: Negative for nausea, vomiting, diarrhea, abdominal pain, blood in stool, hematemesis, jaundice and rectal bleeding. . ; ; Genitourinary: Negative for dysuria, flank pain and hematuria. ; ; Musculoskeletal: Negative for back pain and neck pain. Negative for swelling and trauma.; ; Skin: Negative for pruritus, rash, abrasions, blisters, bruising and skin lesion.; ; Neuro: Negative for headache and neck stiffness. Negative for weakness, altered level of consciousness , altered mental status, extremity weakness, paresthesias, involuntary movement, seizure and +lightheadedness, syncope.       Allergies  Ativan; Clarithromycin; Ibuprofen; Pseudoephedrine; Stadol; Sulfa antibiotics; Toradol; Factive; Other; Prochlorperazine edisylate; Promethazine hcl; Tofranil-pm; and Trimethobenzamide hcl  Home Medications   Current Outpatient Rx  Name  Route  Sig  Dispense  Refill  . ALPRAZOLAM 1 MG PO TABS   Oral   Take 1 mg by mouth at bedtime. For anxiety and sleep         . AMLODIPINE BESYLATE 2.5 MG PO TABS   Oral   Take 2.5 mg by mouth every evening.          Marland Kitchen AMPHETAMINE-DEXTROAMPHETAMINE 20 MG PO TABS   Oral   Take 10 mg by mouth 2 (two) times daily. For attention         . CETIRIZINE HCL 10 MG PO TABS   Oral   Take 10 mg by mouth every morning.          . CYCLOSPORINE 0.05 % OP EMUL   Both Eyes   Place 1 drop into both eyes 2 (two) times daily as needed. For dry eyes         . ESTRADIOL 0.075 MG/24HR TD PTTW   Transdermal   Place 2 patches onto the skin 2 (two) times  a week.         Marland Kitchen FLUOXETINE HCL 40 MG PO CAPS   Oral   Take 40 mg by mouth every morning.          Marland Kitchen MOXIFLOXACIN HCL 400 MG PO TABS   Oral   Take 1 tablet (400 mg total) by mouth daily.   7 tablet   0   . ONDANSETRON 4 MG PO TBDP   Oral   Take 1 tablet (4 mg total) by mouth every 8 (  eight) hours as needed for nausea.   10 tablet   0   . OXYCODONE HCL ER 10 MG PO TB12   Oral   Take 10 mg by mouth every 6 (six) hours as needed. Pain.         Marland Kitchen PRAMIPEXOLE DIHYDROCHLORIDE 0.5 MG PO TABS   Oral   Take 0.5 mg by mouth at bedtime.          . TOPIRAMATE 50 MG PO TABS   Oral   Take 50 mg by mouth at bedtime.          . TRIAMTERENE-HCTZ 75-50 MG PO TABS   Oral   Take 1 tablet by mouth every evening.          . VENTOLIN HFA 108 (90 BASE) MCG/ACT IN AERS   Inhalation   Inhale 2 puffs into the lungs every 6 (six) hours as needed. For shortness of breath         . ALBUTEROL SULFATE (2.5 MG/3ML) 0.083% IN NEBU   Nebulization   Take 2.5 mg by nebulization every 6 (six) hours as needed. Shortness of breath.         Marland Kitchen DIPHENOXYLATE-ATROPINE 2.5-0.025 MG PO TABS   Oral   Take 1 tablet by mouth 4 (four) times daily as needed for diarrhea or loose stools.   30 tablet   0     BP 129/81  Pulse 92  Temp 98 F (36.7 C) (Oral)  Resp 20  Ht 5\' 2"  (1.575 m)  Wt 155 lb (70.308 kg)  BMI 28.35 kg/m2  SpO2 97%  LMP 12/29/1993  Physical Exam 1815: Physical examination:  Nursing notes reviewed; Vital signs and O2 SAT reviewed;  Constitutional: Well developed, Well nourished, Well hydrated, In no acute distress; Head:  Normocephalic, atraumatic; Eyes: EOMI, PERRL, No scleral icterus; ENMT: Mouth and pharynx normal, Mucous membranes moist; Neck: Supple, Full range of motion, No lymphadenopathy; Cardiovascular: Regular rate and rhythm, No murmur, rub, or gallop; Respiratory: Breath sounds clear & equal bilaterally, No rales, rhonchi, wheezes.  Speaking full sentences with  ease, Normal respiratory effort/excursion; Chest: Nontender, Movement normal; Abdomen: Soft, Nontender, Nondistended, Normal bowel sounds;; Extremities: Pulses normal, No tenderness, No edema, No calf edema or asymmetry.; Neuro: AA&Ox3, Major CN grossly intact.  No facial droop. Speech clear. No gross focal motor or sensory deficits in extremities.; Skin: Color normal, Warm, Dry.   ED Course  Procedures   1820:  Pt's Heme/Onc PA called me as pt was being escorted from the ofc to the ED for eval.  States he was called to his waiting room to find the pt laying on the floor.  States she was lying on her right side, arm under her head like a pillow and legs bent at the knees, one on top of the other. Awoke easily when her name was called.  PA states his staff did not hear or see pt fall to the floor.  Pt denied syncope to him.  EPIC chart reviewed:  This is pt's 3rd ED eval for the same complaints in the past 1 week; most recently 2 days ago. Pt also states she has been to her PMD for same.  Pt has had extensive testing completed, including: labs (including negative sed rate), EKG, UA, CT head, CT A/P without acute process.  Pt has been on multiple different abx courses for same.  States to me that "the office made me come, I didn't want to" today.  Workup ordered.  MDM  MDM Reviewed: nursing note, previous chart and vitals Reviewed previous: ECG and labs Interpretation: ECG, labs, x-ray and CT scan    Date: 01/12/2013  Rate: 76  Rhythm: normal sinus rhythm  QRS Axis: normal  Intervals: normal  ST/T Wave abnormalities: normal  Conduction Disutrbances:none  Narrative Interpretation:   Old EKG Reviewed: unchanged; no significant changes from previous EKG dated 01/08/2013.  Results for orders placed during the hospital encounter of 01/12/13  CBC WITH DIFFERENTIAL      Component Value Range   WBC 13.2 (*) 4.0 - 10.5 K/uL   RBC 4.66  3.87 - 5.11 MIL/uL   Hemoglobin 14.5  12.0 - 15.0 g/dL    HCT 16.1  09.6 - 04.5 %   MCV 89.7  78.0 - 100.0 fL   MCH 31.1  26.0 - 34.0 pg   MCHC 34.7  30.0 - 36.0 g/dL   RDW 40.9  81.1 - 91.4 %   Platelets 363  150 - 400 K/uL   Neutrophils Relative 80 (*) 43 - 77 %   Neutro Abs 10.5 (*) 1.7 - 7.7 K/uL   Lymphocytes Relative 14  12 - 46 %   Lymphs Abs 1.9  0.7 - 4.0 K/uL   Monocytes Relative 5  3 - 12 %   Monocytes Absolute 0.6  0.1 - 1.0 K/uL   Eosinophils Relative 1  0 - 5 %   Eosinophils Absolute 0.2  0.0 - 0.7 K/uL   Basophils Relative 0  0 - 1 %   Basophils Absolute 0.0  0.0 - 0.1 K/uL  COMPREHENSIVE METABOLIC PANEL      Component Value Range   Sodium 138  135 - 145 mEq/L   Potassium 4.0  3.5 - 5.1 mEq/L   Chloride 99  96 - 112 mEq/L   CO2 29  19 - 32 mEq/L   Glucose, Bld 94  70 - 99 mg/dL   BUN 10  6 - 23 mg/dL   Creatinine, Ser 7.82  0.50 - 1.10 mg/dL   Calcium 9.2  8.4 - 95.6 mg/dL   Total Protein 7.2  6.0 - 8.3 g/dL   Albumin 4.0  3.5 - 5.2 g/dL   AST 13  0 - 37 U/L   ALT 13  0 - 35 U/L   Alkaline Phosphatase 71  39 - 117 U/L   Total Bilirubin 0.3  0.3 - 1.2 mg/dL   GFR calc non Af Amer 77 (*) >90 mL/min   GFR calc Af Amer 90 (*) >90 mL/min  TROPONIN I      Component Value Range   Troponin I <0.30  <0.30 ng/mL  CK TOTAL AND CKMB      Component Value Range   Total CK 62  7 - 177 U/L   CK, MB 1.6  0.3 - 4.0 ng/mL   Relative Index RELATIVE INDEX IS INVALID  0.0 - 2.5  D-DIMER, QUANTITATIVE      Component Value Range   D-Dimer, Quant <0.27  0.00 - 0.48 ug/mL-FEU  URINALYSIS, ROUTINE W REFLEX MICROSCOPIC      Component Value Range   Color, Urine YELLOW  YELLOW   APPearance CLEAR  CLEAR   Specific Gravity, Urine 1.010  1.005 - 1.030   pH 5.5  5.0 - 8.0   Glucose, UA NEGATIVE  NEGATIVE mg/dL   Hgb urine dipstick NEGATIVE  NEGATIVE   Bilirubin Urine NEGATIVE  NEGATIVE   Ketones, ur NEGATIVE  NEGATIVE mg/dL   Protein,  ur NEGATIVE  NEGATIVE mg/dL   Urobilinogen, UA 0.2  0.0 - 1.0 mg/dL   Nitrite NEGATIVE  NEGATIVE    Leukocytes, UA NEGATIVE  NEGATIVE   Dg Chest 2 View 01/12/2013  *RADIOLOGY REPORT*  Clinical Data: Cough, shortness of breath.  CHEST - 2 VIEW  Comparison: 01/08/2013  Findings: Vascular clips in the right upper abdomen. Lungs clear. Heart size and pulmonary vascularity normal.  No effusion. Visualized bones unremarkable.  IMPRESSION: No acute disease   Original Report Authenticated By: D. Andria Rhein, MD    Ct Head Wo Contrast 01/12/2013  *RADIOLOGY REPORT*  Clinical Data: Unwitnessed fall.  History of appendiceal cancer. Loss of consciousness.  CT HEAD WITHOUT CONTRAST  Technique:  Contiguous axial images were obtained from the base of the skull through the vertex without contrast.  Comparison: CT head without contrast 01/10/2013.  Findings: No acute cortical infarct, hemorrhage, mass lesion is present.  The ventricles are of normal size.  No significant extra- axial fluid collection is present.  Minimal mucosal thickening in the ethmoid air cells has improved. The paranasal sinuses and mastoid air cells are otherwise clear. The osseous skull is intact.  IMPRESSION: Negative CT of the head.   Original Report Authenticated By: Marin Roberts, M.D.      2045:  No acute findings on workup today.  WBC count non-specifically elevated.  No signs of infection on CXR or Udip.  VS remain stable, afebrile. Pt not orthostatic.  Ambulated in hallways with steady gait, easy resps. Pt strongly encouraged to f/u with her PMD this week for good continuity of care. States she has seen him for same "but he doesn't care." She is requesting "more testing" for a possible "vitamin or hormone deficiency."  Explained this was not the scope of practice of the ED and she needs f/u with her PMD for such testing. She then stated "but no one is doing anything for me."  Long d/w pt regarding today's and yesterday's extensive testing.  Multiple questions answered.  Now verb understanding, agreeable to d/c home with outpt f/u.   Requesting "some pain meds and nausea meds before I go home."  Will dose.           Laray Anger, DO 01/15/13 0040

## 2013-01-12 NOTE — Progress Notes (Signed)
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  She is seen every 6 months with routine blood work consisting of serum serotonin and 24 hour urine collection for 5-HIAA.  I was dictating another patient's note when I was interrupted with a "patient on the floor in the waiting room."  I rushed to the scene and [name] is noted to be laying on the floor, on her right side with her right arm was fully extended under her head as a pillow.  Her LE were bent at a 140 degree angle with one leg on top of the other.  She was easily aroused.  She got up on her own without assistance.  She does not recollect how she got on the floor, but the secretary reports that she did not witness or hear anything.  She did not hear the patient fall either.   [name] denies any loss of continence.  She is not diaphoretic.  She is unsure if she bumped her head.  She denies being injured.   Due to this unwitnessed fall, the patient will be escorted to the ED for further evaluation.  She is not in acute distress at the time of exiting the clinic.    [facility] scheduler reports that [name] told her that she was diagnosed with Leukemia in the ED on the telephone when she was scheduling this appointment.   Chart is reviewed.  Labs from 01/09/13 are unimpressive with a normal HGB, WBC, and platelet count.  Differential is unimpressive.    CT of abd/pelvis on 1/13 does not show any abnormalities.    Patient's case discussed with ED physician while patient is being escorted to ED.    Recommend the patient call the clinic for future follow-up appointment after evaluated in ED.  Toriana Sponsel

## 2013-01-12 NOTE — ED Notes (Addendum)
Pt states just remembers slipping out of chair at dr's office. Pt reports weakness and "fainting" today. Pt c/o  Pain in head and neck at this time. Pt reports the pain in her head and difficulty catching her breath while her heart was racing is what she remembers before "going out at the dr's office". Pt also states she thinks she has a vitamin deficiency and would like to be checked for that, or HCG hormone. Pt then reports "coughing up phlegm the color of infection and my lab work is all over the place. Dr Juanetta Gosling doesn't really listen to me either". Reassured Pt we would run testing appropriate to the ED. NAD noted.

## 2013-01-12 NOTE — ED Notes (Signed)
MD at bedside. Dr Alwyn Pea at bedside discussing plan of care and test results with pt. Pt verbalized understanding.

## 2013-01-13 ENCOUNTER — Ambulatory Visit: Payer: Medicare Other | Admitting: Gastroenterology

## 2013-01-13 ENCOUNTER — Telehealth: Payer: Self-pay | Admitting: Gastroenterology

## 2013-01-13 ENCOUNTER — Ambulatory Visit (INDEPENDENT_AMBULATORY_CARE_PROVIDER_SITE_OTHER): Payer: Medicare Other | Admitting: Otolaryngology

## 2013-01-13 LAB — URINE CULTURE: Culture: NO GROWTH

## 2013-01-13 NOTE — Telephone Encounter (Signed)
Pt was a no show

## 2013-01-27 ENCOUNTER — Ambulatory Visit (INDEPENDENT_AMBULATORY_CARE_PROVIDER_SITE_OTHER): Payer: Medicare Other | Admitting: Otolaryngology

## 2013-02-03 ENCOUNTER — Ambulatory Visit (INDEPENDENT_AMBULATORY_CARE_PROVIDER_SITE_OTHER): Payer: Medicare Other | Admitting: Otolaryngology

## 2013-02-10 ENCOUNTER — Ambulatory Visit (INDEPENDENT_AMBULATORY_CARE_PROVIDER_SITE_OTHER): Payer: Medicare Other | Admitting: Otolaryngology

## 2013-03-30 ENCOUNTER — Encounter (HOSPITAL_COMMUNITY): Payer: Self-pay | Admitting: Oncology

## 2013-03-30 ENCOUNTER — Ambulatory Visit (HOSPITAL_COMMUNITY): Payer: Medicare PPO | Admitting: Oncology

## 2013-04-04 ENCOUNTER — Ambulatory Visit (HOSPITAL_COMMUNITY): Payer: Medicare PPO | Admitting: Oncology

## 2013-04-05 ENCOUNTER — Encounter (HOSPITAL_COMMUNITY): Payer: Medicare PPO | Attending: Oncology | Admitting: Oncology

## 2013-04-05 ENCOUNTER — Encounter (HOSPITAL_COMMUNITY): Payer: Self-pay | Admitting: Oncology

## 2013-04-05 VITALS — BP 135/84 | HR 94 | Temp 98.3°F | Resp 18 | Wt 158.0 lb

## 2013-04-05 DIAGNOSIS — C181 Malignant neoplasm of appendix: Secondary | ICD-10-CM | POA: Insufficient documentation

## 2013-04-05 DIAGNOSIS — M899 Disorder of bone, unspecified: Secondary | ICD-10-CM

## 2013-04-05 DIAGNOSIS — M949 Disorder of cartilage, unspecified: Secondary | ICD-10-CM

## 2013-04-05 DIAGNOSIS — D3A020 Benign carcinoid tumor of the appendix: Secondary | ICD-10-CM

## 2013-04-05 DIAGNOSIS — R109 Unspecified abdominal pain: Secondary | ICD-10-CM

## 2013-04-05 DIAGNOSIS — R5381 Other malaise: Secondary | ICD-10-CM

## 2013-04-05 DIAGNOSIS — C7A020 Malignant carcinoid tumor of the appendix: Secondary | ICD-10-CM

## 2013-04-05 LAB — CBC WITH DIFFERENTIAL/PLATELET
Basophils Absolute: 0 10*3/uL (ref 0.0–0.1)
Basophils Relative: 1 % (ref 0–1)
Eosinophils Relative: 3 % (ref 0–5)
Lymphocytes Relative: 28 % (ref 12–46)
MCHC: 34.5 g/dL (ref 30.0–36.0)
MCV: 87.5 fL (ref 78.0–100.0)
Neutro Abs: 3.9 10*3/uL (ref 1.7–7.7)
Platelets: 339 10*3/uL (ref 150–400)
RDW: 12.8 % (ref 11.5–15.5)
WBC: 6.2 10*3/uL (ref 4.0–10.5)

## 2013-04-05 LAB — COMPREHENSIVE METABOLIC PANEL
ALT: 15 U/L (ref 0–35)
AST: 17 U/L (ref 0–37)
Albumin: 3.6 g/dL (ref 3.5–5.2)
CO2: 25 mEq/L (ref 19–32)
Calcium: 8.6 mg/dL (ref 8.4–10.5)
Creatinine, Ser: 0.8 mg/dL (ref 0.50–1.10)
GFR calc non Af Amer: 85 mL/min — ABNORMAL LOW (ref >90)
Sodium: 136 mEq/L (ref 135–145)

## 2013-04-05 NOTE — Progress Notes (Signed)
[name] Maudlin, MD [address] [place] [address]  Carcinoid, of appendix  CURRENT THERAPY:Observation with serum serotonin and 24 hour urine collection for 5 HIAA every 6 months  INTERVAL HISTORY: [name] 52 y.o. 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 Reoperation for her lymph node dissection revealed all nodes to be absolutely negative.  [name] is doing well from a carcinoid standpoint but she does not want to believe this.  She has a long list of nonspecific complaints including diffuse pain, "spots" on legs (that appear to be spider veins and benign, abdominal pain, but mainly in the RUQ, "bone pain," fatigue, etc.  The list is long.  All of which is nonspecific.  I have encouraged her to follow-up with her PCP.  I personally reviewed and went over laboratory results with the patient.  I personally reviewed and went over radiographic studies with the patient. CT abd/pelvis in Jan 2014 was benign.  She reports that the pain was present then.  The RUQ pain is sharp and fleeting.  It resolves spontaneously.  She is S/P cholecystectomy.   She did have a CT of chest in May 2013 revealing a right upper lung nodule which requires follow-up.  She is to follow-up with PCP who will repeat a CT of chest.  She reports that she has been trying to get an appointment with him but she has been having difficulty getting in touch with his office staff.  I suspect that this is due to a lack of effort on her behalf.    Oncologically, she denies any complaints and ROS questioning is positive for flushing, but she is not forthcoming with that complaint, but instead she reports that she is hot.  She denies any heart palpitations and diarrhea.     Past Medical History  Diagnosis Date  . Arthritis   . COPD (chronic obstructive  pulmonary disease)   . Bronchitis   . Fibromyalgia   . Interstitial cystitis   . Migraine   . Depression with anxiety   . UTI (lower urinary tract infection)   . RLS (restless legs syndrome)   . Alopecia, unspecified   . Cervicalgia   . Tobacco use disorder   . PTSD (post-traumatic stress disorder)   . IBS (irritable bowel syndrome)   . GERD (gastroesophageal reflux disease)   . Dysphagia   . PONV (postoperative nausea and vomiting)   . Shortness of breath   . Anxiety   . Carcinoid tumor of appendix   . Kidney stones   . Cancer     carcinoid tumor of appendix  . 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 Reoperation for her lymph node dissection revealed all nodes to be absolutely negative.     has ANXIETY; DRUG DEPENDENCE; DEPRESSION; ALLERGIC RHINITIS; ASTHMA; COPD; GERD; LOW BACK PAIN; Abdominal pain; Chronic diarrhea; IBS (irritable bowel syndrome); Weight loss; Dysphagia; Nausea & vomiting; Acute bronchitis; and Carcinoid, of appendix on her problem list.     is allergic to ativan; clarithromycin; ibuprofen; pseudoephedrine; stadol; sulfa antibiotics; toradol; factive; other; prochlorperazine edisylate; promethazine hcl; tofranil-pm; and trimethobenzamide hcl.  [name] had no medications administered during this visit.  Past Surgical History  Procedure Laterality Date  . Abdominal hysterectomy    . Cholecystectomy    . Tonsillectomy    . Cystostomy w/ bladder dilation      x 52 [place]  . Esophageal dilation  01/01/2012    RMR: Normal esophagus - status post dilation  . Laparoscopic appendectomy  01/05/2012    Procedure: APPENDECTOMY LAPAROSCOPIC;  Surgeon: Dalia Heading;  Location: [facility];  Service: General;  Laterality: N/A;  . Appendectomy  01/05/2012  . Givens capsule study  01/27/2012    Procedure: GIVENS CAPSULE STUDY;  Surgeon:  [name] Ade, MD;  Location: [facility];  Service: Endoscopy;  Laterality: N/A;  7:30  . Colon resection  01/30/2012    Procedure: HAND ASSISTED LAPAROSCOPIC COLON RESECTION;  Surgeon: Dalia Heading, MD;  Location: [facility];  Service: General;  Laterality: N/A;  . Partial colectomy  01/30/2012    Procedure: PARTIAL COLECTOMY;  Surgeon: Dalia Heading, MD;  Location: [facility];  Service: General;  Laterality: N/A;  . Hemicolectomy    . Ileo-colonoscopy  01/01/12    VHQ:IONGEXBM colonic polyps removed as described above/Biopsies taken rule out microscopic colitis.  . Abdominal surgery    . Colon surgery    . Cardiac surgery      Denies any headaches, dizziness, double vision, fevers, chills, night sweats, nausea, vomiting, diarrhea, constipation, chest pain, heart palpitations, shortness of breath, blood in stool, black tarry stool, urinary pain, urinary burning, urinary frequency, hematuria.   PHYSICAL EXAMINATION  ECOG PERFORMANCE STATUS: 1 - Symptomatic but completely ambulatory  Filed Vitals:   04/05/13 1400  BP: 135/84  Pulse: 94  Temp: 98.3 F (36.8 C)  Resp: 18    GENERAL:alert, no distress, well nourished, well developed, comfortable, cooperative and obese SKIN: skin color, texture, turgor are normal, no rashes or significant lesions HEAD: Normocephalic, No masses, lesions, tenderness or abnormalities EYES: normal, Conjunctiva are pink and non-injected EARS: External ears normal OROPHARYNX:mucous membranes are moist  NECK: supple, no adenopathy, thyroid normal size, non-tender, without nodularity, no stridor, non-tender, trachea midline LYMPH:  no palpable lymphadenopathy, no hepatosplenomegaly BREAST:not examined LUNGS: clear to auscultation and percussion HEART: regular rate & rhythm, no murmurs, no gallops, S1 normal and S2 normal ABDOMEN:abdomen soft, obese, normal bowel sounds, no masses or organomegaly, tenderness in RUQ on palpation and no hepatosplenomegaly BACK: Back  symmetric, no curvature. EXTREMITIES:less then 2 second capillary refill, no joint deformities, effusion, or inflammation, no edema, no skin discoloration, no clubbing, no cyanosis  NEURO: alert & oriented x 3 with fluent speech, no focal motor/sensory deficits, gait normal   LABORATORY DATA: CBC    Component Value Date/Time   WBC 13.2* 01/12/2013 1600   RBC 4.66 01/12/2013 1600   HGB 14.5 01/12/2013 1600   HCT 41.8 01/12/2013 1600   HCT 40 11/12/2011 0928   PLT 363 01/12/2013 1600   MCV 89.7 01/12/2013 1600   MCH 31.1 01/12/2013 1600   MCHC 34.7 01/12/2013 1600   RDW 12.7 01/12/2013 1600   LYMPHSABS 1.9 01/12/2013 1600   MONOABS 0.6 01/12/2013 1600   EOSABS 0.2 01/12/2013 1600   BASOSABS 0.0 01/12/2013 1600      Chemistry      Component Value Date/Time   NA 138 01/12/2013 1600   NA 139 11/12/2011 0928   K 4.0 01/12/2013 1600   K 4.6 11/12/2011 0928   CL 99 01/12/2013 1600   CL 105 11/12/2011 0928   CO2 29 01/12/2013 1600   CO2 26  11/12/2011 0928   BUN 10 01/12/2013 1600   BUN 10 11/12/2011 0928   CREATININE 0.86 01/12/2013 1600   CREATININE 0.81 11/12/2011 0928   GLU 84 11/12/2011 0928      Component Value Date/Time   CALCIUM 9.2 01/12/2013 1600   CALCIUM 9.4 11/12/2011 0928   ALKPHOS 71 01/12/2013 1600   ALKPHOS 56 12/04/2011 0926   AST 13 01/12/2013 1600   AST 14 12/04/2011 0926   ALT 13 01/12/2013 1600   BILITOT 0.3 01/12/2013 1600   BILITOT 0.4 12/04/2011 0926        RADIOGRAPHIC STUDIES:  01/10/2013  *RADIOLOGY REPORT*  Clinical Data: Cough, fever, body aches, congestion, weakness,  epigastric pain, nausea, and diarrhea.  CT ABDOMEN AND PELVIS WITH CONTRAST  Technique: Multidetector CT imaging of the abdomen and pelvis was  performed following the standard protocol during bolus  administration of intravenous contrast.  Contrast: OMNIPAQUE IOHEXOL 300 MG/ML SOLN  Comparison: 12/07/2012  Findings: The lung bases are clear. Mild thickening of the distal  esophageal  wall may represent reflux esophagitis.  Surgical absence of the gallbladder. The liver, spleen, pancreas,  adrenal glands, kidneys, abdominal aorta, and retroperitoneal lymph  nodes are unremarkable. The stomach, small bowel, and colon are  not abnormally distended. Diffusely stool filled colon.  Postoperative changes with apparent right hemicolectomy and  transverse ileocolonic anastomoses. No free air or free fluid in  the abdomen.  Pelvis: Stool filled rectosigmoid colon without diverticulitis.  The bladder wall is not thickened. The uterus is surgically absent  at least partially. No abnormal adnexal masses. No free or  loculated pelvic fluid collections. No significant pelvic  lymphadenopathy. Normal alignment of the lumbar vertebrae. No  destructive bone lesions appreciated. No significant change since  previous study.  IMPRESSION:  No acute process demonstrated in the abdomen or pelvis. Stable  appearance since previous study.  Original Report Authenticated By: Burman Nieves, M.D.     ASSESSMENT:  1. 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 Reoperation for her lymph node dissection revealed all nodes to be absolutely negative. 2. Right upper lobe lesion requiring follow-up which is to be performed by PCP.  3. Poor compliance 4. RUQ abdominal tenderness, S/P cholecytectomy.  5. Long list of nonspecific complaints including: pain all over, "spots" on legs, fatigue, "bone pain", abdominal pain, etc.  PLAN:  1. I personally reviewed and went over laboratory results with the patient. 2. I personally reviewed and went over radiographic studies with the patient. 3. She is due for a CT of chest to follow-up on pulmonary nodule by PCP.  4. Encouraged the patient to follow-up with PCP 5. Labs today: CBC diff, CMET, ESR, Serum serotonin, and 24 hour urine collection for 5-HIAA 6.  Return in 6 months for follow-up.  I will send a copy of this note to Dr. Juanetta Gosling, PCP.    All questions were answered. The patient knows to call the clinic with any problems, questions or concerns. We can certainly see the patient much sooner if necessary.  The patient and plan discussed with Glenford Peers, MD and he is in agreement with the aforementioned.  KEFALAS,THOMAS

## 2013-04-05 NOTE — Progress Notes (Signed)
[name] presented for labwork. Labs per MD order drawn via Peripheral Line 23 gauge needle inserted in right antecubital.  Good blood return present. Procedure without incident.  Needle removed intact. Patient tolerated procedure well.

## 2013-04-05 NOTE — Patient Instructions (Signed)
[facility] Discharge Instructions  RECOMMENDATIONS MADE BY THE CONSULTANT AND ANY TEST RESULTS WILL BE SENT TO YOUR REFERRING PHYSICIAN.  MEDICATIONS PRESCRIBED:  None  INSTRUCTIONS GIVEN AND DISCUSSED: Follow-up with Dr. Juanetta Gosling regarding repeat CT of chest. Follow-up with Dr. Juanetta Gosling regarding complaints.   SPECIAL INSTRUCTIONS/FOLLOW-UP: Labs today including CBC diff, CMET, ESR, Serum serotonin, and 24 hour urine collection for 5-HIAA Return in 6 months for follow-up Continue to try to contact Dr. Adah Perl office for an appointment.  Call the [facility] if unable to get in touch with Dr. Juanetta Gosling' office in 2 weeks so that we may be able to help facilitate an appointment with him.   Thank you for choosing [facility] to provide your oncology and hematology care.  To afford each patient quality time with our providers, please arrive at least 15 minutes before your scheduled appointment time.  With your help, our goal is to use those 15 minutes to complete the necessary work-up to ensure our physicians have the information they need to help with your evaluation and healthcare recommendations.    Effective January 1st, 2014, we ask that you re-schedule your appointment with our physicians should you arrive 10 or more minutes late for your appointment.  We strive to give you quality time with our providers, and arriving late affects you and other patients whose appointments are after yours.    Again, thank you for choosing [facility].  Our hope is that these requests will decrease the amount of time that you wait before being seen by our physicians.       _____________________________________________________________  Should you have questions after your visit to [facility], please contact our office at [phone] between the hours of 8:30 a.m. and 5:00 p.m.  Voicemails left after 4:30 p.m. will not be returned  until the following business day.  For prescription refill requests, have your pharmacy contact our office with your prescription refill request.

## 2013-04-08 LAB — SEROTONIN SERUM: Serotonin, Serum: <10 ng/mL — ABNORMAL LOW (ref 56–244)

## 2013-04-12 ENCOUNTER — Ambulatory Visit: Payer: Medicare Other | Admitting: Gastroenterology

## 2013-04-21 ENCOUNTER — Ambulatory Visit: Payer: Medicare PPO | Admitting: Gastroenterology

## 2013-04-25 ENCOUNTER — Ambulatory Visit (HOSPITAL_COMMUNITY)
Admission: RE | Admit: 2013-04-25 | Discharge: 2013-04-25 | Disposition: A | Discharge status: Home / Self Care | Payer: Medicare PPO | Source: Ambulatory Visit | Attending: Pulmonary Disease | Admitting: Pulmonary Disease

## 2013-04-25 ENCOUNTER — Other Ambulatory Visit (HOSPITAL_COMMUNITY): Payer: Self-pay | Admitting: Pulmonary Disease

## 2013-04-25 DIAGNOSIS — R05 Cough: Secondary | ICD-10-CM | POA: Insufficient documentation

## 2013-04-25 DIAGNOSIS — J4489 Other specified chronic obstructive pulmonary disease: Secondary | ICD-10-CM | POA: Insufficient documentation

## 2013-04-25 DIAGNOSIS — R110 Nausea: Secondary | ICD-10-CM | POA: Insufficient documentation

## 2013-04-25 DIAGNOSIS — R059 Cough, unspecified: Secondary | ICD-10-CM | POA: Insufficient documentation

## 2013-04-25 DIAGNOSIS — J449 Chronic obstructive pulmonary disease, unspecified: Secondary | ICD-10-CM | POA: Insufficient documentation

## 2013-04-25 DIAGNOSIS — Z87891 Personal history of nicotine dependence: Secondary | ICD-10-CM | POA: Insufficient documentation

## 2013-04-25 DIAGNOSIS — R5381 Other malaise: Secondary | ICD-10-CM | POA: Insufficient documentation

## 2013-04-25 DIAGNOSIS — R509 Fever, unspecified: Secondary | ICD-10-CM | POA: Insufficient documentation

## 2013-05-04 ENCOUNTER — Ambulatory Visit (HOSPITAL_COMMUNITY)
Admission: RE | Admit: 2013-05-04 | Discharge: 2013-05-04 | Disposition: A | Discharge status: Home / Self Care | Payer: Medicare PPO | Source: Ambulatory Visit | Attending: Pulmonary Disease | Admitting: Pulmonary Disease

## 2013-05-04 ENCOUNTER — Ambulatory Visit: Payer: Medicare PPO | Admitting: Gastroenterology

## 2013-05-04 ENCOUNTER — Other Ambulatory Visit (HOSPITAL_COMMUNITY): Payer: Self-pay | Admitting: Pulmonary Disease

## 2013-05-04 DIAGNOSIS — R609 Edema, unspecified: Secondary | ICD-10-CM

## 2013-05-04 DIAGNOSIS — R52 Pain, unspecified: Secondary | ICD-10-CM

## 2013-05-04 DIAGNOSIS — N6452 Nipple discharge: Secondary | ICD-10-CM

## 2013-05-04 DIAGNOSIS — N644 Mastodynia: Secondary | ICD-10-CM | POA: Insufficient documentation

## 2013-05-04 DIAGNOSIS — N6459 Other signs and symptoms in breast: Secondary | ICD-10-CM | POA: Insufficient documentation

## 2013-05-16 ENCOUNTER — Telehealth: Payer: Self-pay | Admitting: Gastroenterology

## 2013-05-16 ENCOUNTER — Ambulatory Visit: Payer: Medicare PPO | Admitting: Gastroenterology

## 2013-05-16 NOTE — Telephone Encounter (Signed)
Pt was a no show

## 2013-05-16 NOTE — Telephone Encounter (Signed)
She has had 5 appointments since January 2014 to schedule her for colonoscopy. 2 times, no showed. Other times reschedule.  Please send her a letter requesting her to reschedule her missed appointment.

## 2013-05-18 ENCOUNTER — Other Ambulatory Visit (HOSPITAL_COMMUNITY): Payer: Self-pay | Admitting: Pulmonary Disease

## 2013-05-18 ENCOUNTER — Ambulatory Visit
Admission: RE | Admit: 2013-05-18 | Discharge: 2013-05-18 | Disposition: A | Discharge status: Home / Self Care | Payer: Medicare PPO | Source: Ambulatory Visit | Attending: Pulmonary Disease | Admitting: Pulmonary Disease

## 2013-05-18 ENCOUNTER — Ambulatory Visit (HOSPITAL_COMMUNITY): Payer: Medicare PPO

## 2013-05-18 ENCOUNTER — Ambulatory Visit (HOSPITAL_COMMUNITY): Admission: RE | Admit: 2013-05-18 | Payer: Medicare PPO | Source: Ambulatory Visit

## 2013-05-18 ENCOUNTER — Ambulatory Visit (HOSPITAL_COMMUNITY)
Admission: RE | Admit: 2013-05-18 | Discharge: 2013-05-18 | Disposition: A | Discharge status: Home / Self Care | Payer: Medicare PPO | Source: Ambulatory Visit | Attending: Pulmonary Disease | Admitting: Pulmonary Disease

## 2013-05-18 ENCOUNTER — Encounter: Payer: Self-pay | Admitting: Gastroenterology

## 2013-05-18 DIAGNOSIS — N6452 Nipple discharge: Secondary | ICD-10-CM

## 2013-05-18 NOTE — Telephone Encounter (Signed)
Mailed letter to patient to call our office to [facility]

## 2013-06-04 ENCOUNTER — Emergency Department (HOSPITAL_COMMUNITY)
Admission: EM | Admit: 2013-06-04 | Discharge: 2013-06-04 | Disposition: A | Discharge status: Home / Self Care | Payer: Medicare PPO | Attending: Emergency Medicine | Admitting: Emergency Medicine

## 2013-06-04 ENCOUNTER — Encounter (HOSPITAL_COMMUNITY): Payer: Self-pay | Admitting: *Deleted

## 2013-06-04 DIAGNOSIS — R51 Headache: Secondary | ICD-10-CM | POA: Insufficient documentation

## 2013-06-04 DIAGNOSIS — Z85038 Personal history of other malignant neoplasm of large intestine: Secondary | ICD-10-CM | POA: Insufficient documentation

## 2013-06-04 DIAGNOSIS — M7989 Other specified soft tissue disorders: Secondary | ICD-10-CM | POA: Insufficient documentation

## 2013-06-04 DIAGNOSIS — Z87448 Personal history of other diseases of urinary system: Secondary | ICD-10-CM | POA: Insufficient documentation

## 2013-06-04 DIAGNOSIS — Z79899 Other long term (current) drug therapy: Secondary | ICD-10-CM | POA: Insufficient documentation

## 2013-06-04 DIAGNOSIS — Z8679 Personal history of other diseases of the circulatory system: Secondary | ICD-10-CM | POA: Insufficient documentation

## 2013-06-04 DIAGNOSIS — J4489 Other specified chronic obstructive pulmonary disease: Secondary | ICD-10-CM | POA: Insufficient documentation

## 2013-06-04 DIAGNOSIS — Z87442 Personal history of urinary calculi: Secondary | ICD-10-CM | POA: Insufficient documentation

## 2013-06-04 DIAGNOSIS — Z8719 Personal history of other diseases of the digestive system: Secondary | ICD-10-CM | POA: Insufficient documentation

## 2013-06-04 DIAGNOSIS — Z87891 Personal history of nicotine dependence: Secondary | ICD-10-CM | POA: Insufficient documentation

## 2013-06-04 DIAGNOSIS — G2581 Restless legs syndrome: Secondary | ICD-10-CM | POA: Insufficient documentation

## 2013-06-04 DIAGNOSIS — Z8739 Personal history of other diseases of the musculoskeletal system and connective tissue: Secondary | ICD-10-CM | POA: Insufficient documentation

## 2013-06-04 DIAGNOSIS — H5710 Ocular pain, unspecified eye: Secondary | ICD-10-CM | POA: Insufficient documentation

## 2013-06-04 DIAGNOSIS — R112 Nausea with vomiting, unspecified: Secondary | ICD-10-CM | POA: Insufficient documentation

## 2013-06-04 DIAGNOSIS — J3489 Other specified disorders of nose and nasal sinuses: Secondary | ICD-10-CM | POA: Insufficient documentation

## 2013-06-04 DIAGNOSIS — R6883 Chills (without fever): Secondary | ICD-10-CM | POA: Insufficient documentation

## 2013-06-04 DIAGNOSIS — R05 Cough: Secondary | ICD-10-CM

## 2013-06-04 DIAGNOSIS — Z87440 Personal history of urinary (tract) infections: Secondary | ICD-10-CM | POA: Insufficient documentation

## 2013-06-04 DIAGNOSIS — R059 Cough, unspecified: Secondary | ICD-10-CM | POA: Insufficient documentation

## 2013-06-04 DIAGNOSIS — F341 Dysthymic disorder: Secondary | ICD-10-CM | POA: Insufficient documentation

## 2013-06-04 DIAGNOSIS — J449 Chronic obstructive pulmonary disease, unspecified: Secondary | ICD-10-CM | POA: Insufficient documentation

## 2013-06-04 DIAGNOSIS — F4310 Post-traumatic stress disorder, unspecified: Secondary | ICD-10-CM | POA: Insufficient documentation

## 2013-06-04 HISTORY — DX: Unspecified kidney failure: N19

## 2013-06-04 LAB — BASIC METABOLIC PANEL
BUN: 12 mg/dL (ref 6–23)
CO2: 29 mEq/L (ref 19–32)
Calcium: 8.7 mg/dL (ref 8.4–10.5)
Chloride: 98 mEq/L (ref 96–112)
Creatinine, Ser: 1.11 mg/dL — ABNORMAL HIGH (ref 0.50–1.10)
Glucose, Bld: 105 mg/dL — ABNORMAL HIGH (ref 70–99)

## 2013-06-04 LAB — CBC WITH DIFFERENTIAL/PLATELET
Basophils Absolute: 0 10*3/uL (ref 0.0–0.1)
Eosinophils Relative: 3 % (ref 0–5)
HCT: 37.4 % (ref 36.0–46.0)
Hemoglobin: 13.2 g/dL (ref 12.0–15.0)
Lymphocytes Relative: 33 % (ref 12–46)
Lymphs Abs: 2.9 10*3/uL (ref 0.7–4.0)
MCV: 89.5 fL (ref 78.0–100.0)
Monocytes Absolute: 0.7 10*3/uL (ref 0.1–1.0)
Monocytes Relative: 8 % (ref 3–12)
Neutro Abs: 4.8 10*3/uL (ref 1.7–7.7)
RBC: 4.18 MIL/uL (ref 3.87–5.11)
RDW: 12.9 % (ref 11.5–15.5)
WBC: 8.8 10*3/uL (ref 4.0–10.5)

## 2013-06-04 MED ORDER — ONDANSETRON HCL 4 MG/2ML IJ SOLN
4.0000 mg | Freq: Once | INTRAMUSCULAR | Status: AC
  Administered 2013-06-04: 4 mg via INTRAVENOUS
  Filled 2013-06-04: qty 2

## 2013-06-04 MED ORDER — POTASSIUM CHLORIDE CRYS ER 20 MEQ PO TBCR
40.0000 meq | EXTENDED_RELEASE_TABLET | Freq: Once | ORAL | Status: AC
  Administered 2013-06-04: 40 meq via ORAL
  Filled 2013-06-04: qty 2

## 2013-06-04 MED ORDER — DIPHENHYDRAMINE HCL 50 MG/ML IJ SOLN
25.0000 mg | Freq: Once | INTRAMUSCULAR | Status: AC
  Administered 2013-06-04: 25 mg via INTRAVENOUS
  Filled 2013-06-04: qty 1

## 2013-06-04 MED ORDER — METOCLOPRAMIDE HCL 5 MG/ML IJ SOLN
10.0000 mg | Freq: Once | INTRAMUSCULAR | Status: AC
  Administered 2013-06-04: 10 mg via INTRAVENOUS
  Filled 2013-06-04: qty 2

## 2013-06-04 NOTE — ED Notes (Addendum)
Pt c/o very bad headache x 4-5 days. C/o n/v, head pain, left eye pain, itching, and bruising easy. Pt has multiple complaints.

## 2013-06-04 NOTE — ED Notes (Signed)
Pt alert & oriented x4, stable gait. Patient given discharge instructions, paperwork & prescription(s). Patient  instructed to stop at the registration desk to finish any additional paperwork. Patient verbalized understanding. Pt left department w/ no further questions. 

## 2013-06-04 NOTE — ED Provider Notes (Signed)
History    This chart was scribed for Joya Gaskins, MD by Leone Payor, ED Scribe. This patient was seen in room APA01/[facility]01 and the patient's care was started 11:13 PM.   CSN: [id number]  Arrival date & time 06/04/13  2154   First MD Initiated Contact with Patient 06/04/13 2305      Chief Complaint  Patient presents with  . Headache  . Eye Pain     Patient is a 52 y.o. female presenting with headaches and eye pain. The history is provided by the patient. No language interpreter was used.  Headache Pain location:  Generalized Radiates to:  Does not radiate Onset quality:  Gradual Duration:  5 days Timing:  Constant Progression:  Unchanged Chronicity:  New Similar to prior headaches: yes   Context: coughing   Relieved by:  Nothing Worsened by:  Nothing tried Ineffective treatments:  Prescription medications Associated symptoms: cough, eye pain, nausea and vomiting   Associated symptoms: no diarrhea and no fever   Eye Pain Associated symptoms include headaches.    HPI Comments: [name] is a 52 y.o. female who presents to the Emergency Department complaining of an ongoing, constant, unchanged HA for 4-5 days. Pt states she started having  eye pain that started today. She also has associated coughing for which she was prescribed amoxicillin by her PCP. She also complains of having chills, rhinorrhea, nausea, vomiting, leg swelling. States having a h/o migraines but they are not very frequent. Also reports increased bruising recently but is unsure why she is bruising. She denies diarrhea.     Past Medical History  Diagnosis Date  . Arthritis   . COPD (chronic obstructive pulmonary disease)   . Bronchitis   . Fibromyalgia   . Interstitial cystitis   . Migraine   . Depression with anxiety   . UTI (lower urinary tract infection)   . RLS (restless legs syndrome)   . Alopecia, unspecified   . Cervicalgia   . Tobacco use disorder   . PTSD (post-traumatic stress  disorder)   . IBS (irritable bowel syndrome)   . GERD (gastroesophageal reflux disease)   . Dysphagia   . PONV (postoperative nausea and vomiting)   . Shortness of breath   . Anxiety   . Carcinoid tumor of appendix   . Cancer     carcinoid tumor of appendix  . 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 Reoperation for her lymph node dissection revealed all nodes to be absolutely negative.   . Kidney stones   . Renal failure     Past Surgical History  Procedure Laterality Date  . Abdominal hysterectomy    . Cholecystectomy    . Tonsillectomy    . Cystostomy w/ bladder dilation      x 52 [place]  . Esophageal dilation  01/01/2012    RMR: Normal esophagus - status post dilation  . Laparoscopic appendectomy  01/05/2012    Procedure: APPENDECTOMY LAPAROSCOPIC;  Surgeon: Dalia Heading;  Location: [facility];  Service: General;  Laterality: N/A;  . Appendectomy  01/05/2012  . Givens capsule study  01/27/2012    Procedure: GIVENS CAPSULE STUDY;  Surgeon: [name] Ade, MD;  Location: [facility];  Service: Endoscopy;  Laterality: N/A;  7:30  . Colon resection  01/30/2012    Procedure: HAND ASSISTED LAPAROSCOPIC COLON RESECTION;  Surgeon: Dalia Heading, MD;  Location: [facility];  Service: General;  Laterality: N/A;  . Partial colectomy  01/30/2012    Procedure: PARTIAL COLECTOMY;  Surgeon: Dalia Heading, MD;  Location: [facility];  Service: General;  Laterality: N/A;  . Hemicolectomy    . Ileo-colonoscopy  01/01/12    [id number] colonic polyps removed as described above/Biopsies taken rule out microscopic colitis.  . Abdominal surgery    . Colon surgery    . Cardiac surgery      Family History  Problem Relation Age of Onset  . Coronary artery disease Mother   . Heart attack Mother   . Pulmonary embolism Mother   . Alcohol abuse Father   . COPD Father   .  Lung disease Father   . Anesthesia problems Neg Hx   . Hypotension Neg Hx   . Malignant hyperthermia Neg Hx   . Pseudochol deficiency Neg Hx   . Other Neg Hx     History  Substance Use Topics  . Smoking status: Former Smoker -- 0.25 packs/day for 8 years    Types: Cigarettes    Quit date: 01/25/2012  . Smokeless tobacco: Never Used  . Alcohol Use: No    OB History   Grav Para Term Preterm Abortions TAB SAB Ect Mult Living   1 1 1       1       Review of Systems  Constitutional: Negative for fever.  Eyes: Positive for pain.  Respiratory: Positive for cough.   Gastrointestinal: Positive for nausea and vomiting. Negative for diarrhea.  Neurological: Positive for headaches.  All other systems reviewed and are negative.     Allergies  Ativan; Clarithromycin; Ibuprofen; Pseudoephedrine; Stadol; Sulfa antibiotics; Toradol; Factive; Other; Prochlorperazine edisylate; Promethazine hcl; Tofranil-pm; and Trimethobenzamide hcl  Home Medications   Current Outpatient Rx  Name  Route  Sig  Dispense  Refill  . albuterol (PROVENTIL) (2.5 MG/3ML) 0.083% nebulizer solution   Nebulization   Take 2.5 mg by nebulization every 6 (six) hours as needed. Shortness of breath.         . ALPRAZolam (XANAX) 1 MG tablet   Oral   Take 1 mg by mouth at bedtime. For anxiety and sleep         . cetirizine (ZYRTEC) 10 MG tablet   Oral   Take 10 mg by mouth every morning.          . cycloSPORINE (RESTASIS) 0.05 % ophthalmic emulsion   Both Eyes   Place 1 drop into both eyes 2 (two) times daily as needed. For dry eyes         . estradiol (VIVELLE-DOT) 0.1 MG/24HR   Transdermal   Place 1 patch onto the skin every 3 (three) days.         Marland Kitchen FLUoxetine (PROZAC) 40 MG capsule   Oral   Take 40 mg by mouth every morning.          . moxifloxacin (AVELOX) 400 MG tablet   Oral   Take 400 mg by mouth daily. Started 2 days ago         . oxyCODONE (OXYCONTIN) 10 MG 12 hr tablet    Oral   Take 10 mg by mouth every 6 (six) hours as needed. Pain.         . pramipexole (MIRAPEX) 0.5 MG tablet   Oral   Take 0.5 mg by mouth at bedtime.          Marland Kitchen  topiramate (TOPAMAX) 50 MG tablet   Oral   Take 50 mg by mouth at bedtime.          . triamterene-hydrochlorothiazide (MAXZIDE) 75-50 MG per tablet   Oral   Take 1 tablet by mouth daily.          . VENTOLIN HFA 108 (90 BASE) MCG/ACT inhaler   Inhalation   Inhale 2 puffs into the lungs every 6 (six) hours as needed. For shortness of breath         . amphetamine-dextroamphetamine (ADDERALL) 20 MG tablet   Oral   Take 10 mg by mouth 2 (two) times daily. For attention           BP 131/73  Pulse 98  Temp(Src) 98.2 F (36.8 C) (Oral)  Resp 20  Ht 5' 2.5" (1.588 m)  Wt 160 lb (72.576 kg)  BMI 28.78 kg/m2  SpO2 98%  LMP 12/29/1993  Physical Exam CONSTITUTIONAL: Well developed/well nourished HEAD: Normocephalic/atraumatic EYES: EOMI/PERRL. Small R subconjunctival hemorrhage. No corneal hazing.  No signs of trauma No nystagmus ENMT: Mucous membranes moist NECK: supple no meningeal signs SPINE:entire spine nontender CV: S1/S2 noted, no murmurs/rubs/gallops noted LUNGS: Lungs are clear to auscultation bilaterally, no apparent distress ABDOMEN: soft, nontender, no rebound or guarding GU:no cva tenderness NEURO: Pt is awake/alert, moves all extremitiesx4 No arm/leg drift.  No facial droop.  No ataxia noted EXTREMITIES: pulses normal, full ROM SKIN: warm, color normal, no rash noted.  One small bruise noted to right posterior flank PSYCH: no abnormalities of mood noted  ED Course  Procedures (including critical care time)  DIAGNOSTIC STUDIES: Oxygen Saturation is 98% on room air, normal by my interpretation.    COORDINATION OF CARE: 11:12 PM Discussed treatment plan with pt at bedside and pt agreed to plan.   Pt well appearing, no distress, felt improved after meds She did have small subconj hem,  advised her to monitor this.  For HA, no neuro deficits, doubt acute neurologic process   Labs Reviewed  BASIC METABOLIC PANEL - Abnormal; Notable for the following:    Potassium 3.0 (*)    Glucose, Bld 105 (*)    Creatinine, Ser 1.11 (*)    GFR calc non Af Amer 57 (*)    GFR calc Af Amer 66 (*)    All other components within normal limits  CBC WITH DIFFERENTIAL  URINALYSIS, ROUTINE W REFLEX MICROSCOPIC     MDM  Nursing notes including past medical history and social history reviewed and considered in documentation Labs/vital reviewed and considered      I personally performed the services described in this documentation, which was scribed in my presence. The recorded information has been reviewed and is accurate.     Joya Gaskins, MD 06/05/13 [phone]

## 2013-06-14 ENCOUNTER — Emergency Department (HOSPITAL_COMMUNITY)
Admission: EM | Admit: 2013-06-14 | Discharge: 2013-06-14 | Disposition: A | Discharge status: Home / Self Care | Payer: Medicare PPO | Attending: Emergency Medicine | Admitting: Emergency Medicine

## 2013-06-14 ENCOUNTER — Encounter (HOSPITAL_COMMUNITY): Payer: Self-pay | Admitting: Emergency Medicine

## 2013-06-14 ENCOUNTER — Emergency Department (HOSPITAL_COMMUNITY): Payer: Medicare PPO

## 2013-06-14 DIAGNOSIS — I998 Other disorder of circulatory system: Secondary | ICD-10-CM | POA: Insufficient documentation

## 2013-06-14 DIAGNOSIS — Z8679 Personal history of other diseases of the circulatory system: Secondary | ICD-10-CM | POA: Insufficient documentation

## 2013-06-14 DIAGNOSIS — J4489 Other specified chronic obstructive pulmonary disease: Secondary | ICD-10-CM | POA: Insufficient documentation

## 2013-06-14 DIAGNOSIS — L299 Pruritus, unspecified: Secondary | ICD-10-CM | POA: Insufficient documentation

## 2013-06-14 DIAGNOSIS — Z87442 Personal history of urinary calculi: Secondary | ICD-10-CM | POA: Insufficient documentation

## 2013-06-14 DIAGNOSIS — R52 Pain, unspecified: Secondary | ICD-10-CM | POA: Insufficient documentation

## 2013-06-14 DIAGNOSIS — J449 Chronic obstructive pulmonary disease, unspecified: Secondary | ICD-10-CM | POA: Insufficient documentation

## 2013-06-14 DIAGNOSIS — Z8739 Personal history of other diseases of the musculoskeletal system and connective tissue: Secondary | ICD-10-CM | POA: Insufficient documentation

## 2013-06-14 DIAGNOSIS — G2581 Restless legs syndrome: Secondary | ICD-10-CM | POA: Insufficient documentation

## 2013-06-14 DIAGNOSIS — Z8719 Personal history of other diseases of the digestive system: Secondary | ICD-10-CM | POA: Insufficient documentation

## 2013-06-14 DIAGNOSIS — J40 Bronchitis, not specified as acute or chronic: Secondary | ICD-10-CM | POA: Insufficient documentation

## 2013-06-14 DIAGNOSIS — IMO0001 Reserved for inherently not codable concepts without codable children: Secondary | ICD-10-CM | POA: Insufficient documentation

## 2013-06-14 DIAGNOSIS — F341 Dysthymic disorder: Secondary | ICD-10-CM | POA: Insufficient documentation

## 2013-06-14 DIAGNOSIS — R61 Generalized hyperhidrosis: Secondary | ICD-10-CM | POA: Insufficient documentation

## 2013-06-14 DIAGNOSIS — Z87891 Personal history of nicotine dependence: Secondary | ICD-10-CM | POA: Insufficient documentation

## 2013-06-14 DIAGNOSIS — Z79899 Other long term (current) drug therapy: Secondary | ICD-10-CM | POA: Insufficient documentation

## 2013-06-14 DIAGNOSIS — R6883 Chills (without fever): Secondary | ICD-10-CM | POA: Insufficient documentation

## 2013-06-14 DIAGNOSIS — Z87448 Personal history of other diseases of urinary system: Secondary | ICD-10-CM | POA: Insufficient documentation

## 2013-06-14 LAB — URINALYSIS, ROUTINE W REFLEX MICROSCOPIC
Bilirubin Urine: NEGATIVE
Hgb urine dipstick: NEGATIVE
Ketones, ur: NEGATIVE mg/dL
Nitrite: NEGATIVE
Specific Gravity, Urine: 1.025 (ref 1.005–1.030)
Urobilinogen, UA: 0.2 mg/dL (ref 0.0–1.0)

## 2013-06-14 LAB — COMPREHENSIVE METABOLIC PANEL
ALT: 16 U/L (ref 0–35)
AST: 16 U/L (ref 0–37)
Alkaline Phosphatase: 71 U/L (ref 39–117)
Calcium: 8.5 mg/dL (ref 8.4–10.5)
GFR calc Af Amer: >90 mL/min (ref >90)
Glucose, Bld: 101 mg/dL — ABNORMAL HIGH (ref 70–99)
Potassium: 3.6 mEq/L (ref 3.5–5.1)
Sodium: 136 mEq/L (ref 135–145)
Total Protein: 6.3 g/dL (ref 6.0–8.3)

## 2013-06-14 LAB — CBC WITH DIFFERENTIAL/PLATELET
Basophils Absolute: 0 10*3/uL (ref 0.0–0.1)
Basophils Relative: 1 % (ref 0–1)
Eosinophils Absolute: 0.4 10*3/uL (ref 0.0–0.7)
Eosinophils Relative: 5 % (ref 0–5)
HCT: 35.6 % — ABNORMAL LOW (ref 36.0–46.0)
Hemoglobin: 12.2 g/dL (ref 12.0–15.0)
MCH: 30.7 pg (ref 26.0–34.0)
MCHC: 34.3 g/dL (ref 30.0–36.0)
MCV: 89.7 fL (ref 78.0–100.0)
Monocytes Absolute: 0.6 10*3/uL (ref 0.1–1.0)
Monocytes Relative: 8 % (ref 3–12)
Neutro Abs: 3.4 10*3/uL (ref 1.7–7.7)
RDW: 13.1 % (ref 11.5–15.5)

## 2013-06-14 MED ORDER — KETOROLAC TROMETHAMINE 60 MG/2ML IM SOLN
60.0000 mg | Freq: Once | INTRAMUSCULAR | Status: AC
  Administered 2013-06-14: 60 mg via INTRAMUSCULAR
  Filled 2013-06-14: qty 2

## 2013-06-14 MED ORDER — OXYCODONE-ACETAMINOPHEN 5-325 MG PO TABS
2.0000 | ORAL_TABLET | Freq: Once | ORAL | Status: AC
  Administered 2013-06-14: 2 via ORAL
  Filled 2013-06-14: qty 2

## 2013-06-14 MED ORDER — ALBUTEROL SULFATE HFA 108 (90 BASE) MCG/ACT IN AERS: 1.0000 | INHALATION_SPRAY | Freq: Four times a day (QID) | RESPIRATORY_TRACT | Status: DC | PRN

## 2013-06-14 MED ORDER — DOXYCYCLINE HYCLATE 100 MG PO CAPS: 100.0000 mg | ORAL_CAPSULE | Freq: Two times a day (BID) | ORAL | Status: DC

## 2013-06-14 MED ORDER — ONDANSETRON 8 MG PO TBDP
ORAL_TABLET | ORAL | Status: AC
  Administered 2013-06-14: 8 mg via ORAL
  Filled 2013-06-14: qty 1

## 2013-06-14 MED ORDER — ONDANSETRON 8 MG PO TBDP: 8.0000 mg | ORAL_TABLET | Freq: Once | ORAL | Status: AC

## 2013-06-14 MED ORDER — DIPHENHYDRAMINE HCL 25 MG PO CAPS
50.0000 mg | ORAL_CAPSULE | Freq: Once | ORAL | Status: AC
  Administered 2013-06-14: 50 mg via ORAL
  Filled 2013-06-14: qty 2

## 2013-06-14 NOTE — ED Notes (Signed)
Pt c/o productive cough with green blood tinged sputum, generalized body aches, sweats, and generalized swelling. nad noted.

## 2013-06-14 NOTE — ED Notes (Signed)
Pt refusing the percocet tablets, states "I thought I would be getting an IV" explained to pt that she did not have an IV ordered, pt states " I usually get pain medication IV, can I have IM instead" asked pt what pain medication she gets pt states "I get dilaudid", Pt states "I feel too nauseous to get anymore medication by mouth". Dr. Adriana Simas notified, additional orders given

## 2013-06-14 NOTE — ED Notes (Signed)
Pt requesting pain medication, ice pack and warm blanket, ice pack, warm blanket provided, Dr. Adriana Simas notified of request of pain medication

## 2013-06-14 NOTE — ED Notes (Signed)
Dr. Adriana Simas at bedside, see edp assessment for further.

## 2013-06-14 NOTE — ED Notes (Signed)
Pt presents to er with multiple complaints 1. Cough that started a few months ago becoming worse Thursday, cough is productive with green, yellow, blood tinged sputum production, chills, "sweats" at times 2. Requesting to have her thyroid checked 3. Bruising all over for the past week 4. Swelling to hands and feet area for a few weeks with increase of weight gain of 30 lbs over the past month or two.  5. Itching all over 6. Lower back pain, posterior rib cage pain, denies any uti symptoms

## 2013-06-14 NOTE — ED Notes (Addendum)
Pt given coke and cracker's  per request.

## 2013-06-14 NOTE — ED Notes (Signed)
Explained to pt that Dr. Adriana Simas had not ordered dilaudid iv or im but did give an order for zofran, zofran given per order, pt states that she will go ahead and take the two percocet's now, asked pt if she wanted to wait until the nausea was better, pt states "no I will go ahead and take the pain medication with the zofran".

## 2013-06-14 NOTE — ED Provider Notes (Signed)
History     This chart was scribed for [name] Hutching, MD, MD by Smitty Pluck, ED Scribe. The patient was seen in room APA07/APA07 and the patient's care was started at 7:47 AM.   CSN: [id number]  Arrival date & time 06/14/13  3086   Chief Complaint  Patient presents with  . Cough  . Generalized Body Aches     The history is provided by the patient and medical records. No language interpreter was used.   HPI Comments: [name] is a 52 y.o. female with hx of RA, COPD and carcinoid tumor on appendix in 2013 and had right hemicolectomy in 2013 Lovell Sheehan) who presents to the Emergency Department complaining of constant, moderate productive cough with blood tinged, green sputum onset 5 days ago. She reports having generalized body aches that is worse in back. She reports that she has generalized extremity swelling and bruising. She denies injury to extremities. Pt reports that she smokes e-cig but she use to smoke cigarettes. She takes oxycodone, Prozac, Mirapex, pramipexole 0.5 mg at bedtime for restless leg syndrome, triamterene 75-50 mg/day (she only takes half of dose), vitamin D, alprazolam 1 mg and albuterol inhaler. She states she has gained weight (30 lbs) over the past couple of months. Pt reports that she has intermittent itching sensation all over body that has been ongoing for past month. She states she has episodes of diaphoresis and chills. Pt denies fever, nausea, vomiting, diarrhea, weakness, SOB and any other pain.   PCP is Dr. Juanetta Gosling  Past Medical History  Diagnosis Date  . Arthritis   . COPD (chronic obstructive pulmonary disease)   . Bronchitis   . Fibromyalgia   . Interstitial cystitis   . Migraine   . Depression with anxiety   . UTI (lower urinary tract infection)   . RLS (restless legs syndrome)   . Alopecia, unspecified   . Cervicalgia   . Tobacco use disorder   . PTSD (post-traumatic stress disorder)   . IBS (irritable bowel syndrome)   . GERD  (gastroesophageal reflux disease)   . Dysphagia   . PONV (postoperative nausea and vomiting)   . Shortness of breath   . Anxiety   . Carcinoid tumor of appendix   . Cancer     carcinoid tumor of appendix  . 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 Reoperation for her lymph node dissection revealed all nodes to be absolutely negative.   . Kidney stones   . Renal failure     Past Surgical History  Procedure Laterality Date  . Abdominal hysterectomy    . Cholecystectomy    . Tonsillectomy    . Cystostomy w/ bladder dilation      x 52 [place]  . Esophageal dilation  01/01/2012    RMR: Normal esophagus - status post dilation  . Laparoscopic appendectomy  01/05/2012    Procedure: APPENDECTOMY LAPAROSCOPIC;  Surgeon: Dalia Heading;  Location: [facility];  Service: General;  Laterality: N/A;  . Appendectomy  01/05/2012  . Givens capsule study  01/27/2012    Procedure: GIVENS CAPSULE STUDY;  Surgeon: [name] Ade, MD;  Location: [facility];  Service: Endoscopy;  Laterality: N/A;  7:30  . Colon resection  01/30/2012    Procedure: HAND ASSISTED LAPAROSCOPIC COLON RESECTION;  Surgeon: Dalia Heading, MD;  Location: [facility];  Service:  General;  Laterality: N/A;  . Partial colectomy  01/30/2012    Procedure: PARTIAL COLECTOMY;  Surgeon: Dalia Heading, MD;  Location: [facility];  Service: General;  Laterality: N/A;  . Hemicolectomy    . Ileo-colonoscopy  01/01/12    [id number] colonic polyps removed as described above/Biopsies taken rule out microscopic colitis.  . Abdominal surgery    . Colon surgery    . Cardiac surgery      Family History  Problem Relation Age of Onset  . Coronary artery disease Mother   . Heart attack Mother   . Pulmonary embolism Mother   . Alcohol abuse Father   . COPD Father   . Lung disease Father   . Anesthesia problems Neg Hx   .  Hypotension Neg Hx   . Malignant hyperthermia Neg Hx   . Pseudochol deficiency Neg Hx   . Other Neg Hx     History  Substance Use Topics  . Smoking status: Former Smoker -- 0.25 packs/day for 8 years    Types: Cigarettes    Quit date: 01/25/2012  . Smokeless tobacco: Never Used  . Alcohol Use: No    OB History   Grav Para Term Preterm Abortions TAB SAB Ect Mult Living   1 1 1       1       Review of Systems 10 Systems reviewed and all are negative for acute change except as noted in the HPI.   Allergies  Ativan; Clarithromycin; Ibuprofen; Pseudoephedrine; Reglan; Stadol; Sulfa antibiotics; Toradol; Factive; Other; Prochlorperazine edisylate; Promethazine hcl; Tofranil-pm; and Trimethobenzamide hcl  Home Medications   Current Outpatient Rx  Name  Route  Sig  Dispense  Refill  . albuterol (PROVENTIL) (2.5 MG/3ML) 0.083% nebulizer solution   Nebulization   Take 2.5 mg by nebulization every 6 (six) hours as needed. Shortness of breath.         . ALPRAZolam (XANAX) 1 MG tablet   Oral   Take 1 mg by mouth at bedtime. For anxiety and sleep         . amphetamine-dextroamphetamine (ADDERALL) 20 MG tablet   Oral   Take 10 mg by mouth 2 (two) times daily. For attention         . cetirizine (ZYRTEC) 10 MG tablet   Oral   Take 10 mg by mouth every morning.          . cycloSPORINE (RESTASIS) 0.05 % ophthalmic emulsion   Both Eyes   Place 1 drop into both eyes 2 (two) times daily as needed. For dry eyes         . estradiol (VIVELLE-DOT) 0.1 MG/24HR   Transdermal   Place 1 patch onto the skin every 3 (three) days.         Marland Kitchen FLUoxetine (PROZAC) 40 MG capsule   Oral   Take 40 mg by mouth every morning.          . moxifloxacin (AVELOX) 400 MG tablet   Oral   Take 400 mg by mouth daily. Started 2 days ago         . oxyCODONE (OXYCONTIN) 10 MG 12 hr tablet   Oral   Take 10 mg by mouth every 6 (six) hours as needed. Pain.         . pramipexole (MIRAPEX)  0.5 MG tablet   Oral   Take 0.5 mg by mouth at bedtime.          . topiramate (TOPAMAX) 50  MG tablet   Oral   Take 50 mg by mouth at bedtime.          . triamterene-hydrochlorothiazide (MAXZIDE) 75-50 MG per tablet   Oral   Take 1 tablet by mouth daily.          . VENTOLIN HFA 108 (90 BASE) MCG/ACT inhaler   Inhalation   Inhale 2 puffs into the lungs every 6 (six) hours as needed. For shortness of breath           BP 117/73  Pulse 73  Temp(Src) 98.6 F (37 C)  Resp 18  Ht 5\' 2"  (1.575 m)  Wt 172 lb (78.019 kg)  BMI 31.45 kg/m2  SpO2 98%  LMP 12/29/1993  Physical Exam  Nursing note and vitals reviewed. Constitutional: She is oriented to person, place, and time. She appears well-developed and well-nourished.  HENT:  Head: Normocephalic and atraumatic.  Eyes: Conjunctivae and EOM are normal. Pupils are equal, round, and reactive to light.  Neck: Normal range of motion. Neck supple.  Cardiovascular: Normal rate, regular rhythm and normal heart sounds.   No murmur heard. Pulmonary/Chest: Effort normal and breath sounds normal.  Abdominal: Soft. Bowel sounds are normal.  Musculoskeletal: Normal range of motion.  Lymphadenopathy:    She has no cervical adenopathy.  Neurological: She is alert and oriented to person, place, and time.  Skin: Skin is warm and dry.  Ecchymosis on right superior buttocks, left proximal lateral tibia and left posterior forearm. 2+ peripheral edema  Psychiatric: She has a normal mood and affect.    ED Course  Procedures (including critical care time) DIAGNOSTIC STUDIES: Oxygen Saturation is 98% on room air, normal by my interpretation.    COORDINATION OF CARE: 7:56 AM Discussed ED treatment with pt and pt agrees xray, labs and thyroid screen.  Medications  diphenhydrAMINE (BENADRYL) capsule 50 mg (50 mg Oral Given 06/14/13 0836)  oxyCODONE-acetaminophen (PERCOCET/ROXICET) 5-325 MG per tablet 2 tablet (2 tablets Oral Given 06/14/13  0846)  ondansetron (ZOFRAN-ODT) disintegrating tablet 8 mg (8 mg Oral Given 06/14/13 0845)  ketorolac (TORADOL) injection 60 mg (60 mg Intramuscular Given 06/14/13 0921)   Results for orders placed during the hospital encounter of 06/14/13  COMPREHENSIVE METABOLIC PANEL      Result Value Range   Sodium 136  135 - 145 mEq/L   Potassium 3.6  3.5 - 5.1 mEq/L   Chloride 99  96 - 112 mEq/L   CO2 31  19 - 32 mEq/L   Glucose, Bld 101 (*) 70 - 99 mg/dL   BUN 11  6 - 23 mg/dL   Creatinine, Ser 4.09  0.50 - 1.10 mg/dL   Calcium 8.5  8.4 - 81.1 mg/dL   Total Protein 6.3  6.0 - 8.3 g/dL   Albumin 3.1 (*) 3.5 - 5.2 g/dL   AST 16  0 - 37 U/L   ALT 16  0 - 35 U/L   Alkaline Phosphatase 71  39 - 117 U/L   Total Bilirubin 0.2 (*) 0.3 - 1.2 mg/dL   GFR calc non Af Amer 79 (*) >90 mL/min   GFR calc Af Amer >90  >90 mL/min  CBC WITH DIFFERENTIAL      Result Value Range   WBC 7.2  4.0 - 10.5 K/uL   RBC 3.97  3.87 - 5.11 MIL/uL   Hemoglobin 12.2  12.0 - 15.0 g/dL   HCT 91.4 (*) 78.2 - 95.6 %   MCV 89.7  78.0 - 100.0  fL   MCH 30.7  26.0 - 34.0 pg   MCHC 34.3  30.0 - 36.0 g/dL   RDW 32.4  40.1 - 02.7 %   Platelets 300  150 - 400 K/uL   Neutrophils Relative % 48  43 - 77 %   Neutro Abs 3.4  1.7 - 7.7 K/uL   Lymphocytes Relative 39  12 - 46 %   Lymphs Abs 2.8  0.7 - 4.0 K/uL   Monocytes Relative 8  3 - 12 %   Monocytes Absolute 0.6  0.1 - 1.0 K/uL   Eosinophils Relative 5  0 - 5 %   Eosinophils Absolute 0.4  0.0 - 0.7 K/uL   Basophils Relative 1  0 - 1 %   Basophils Absolute 0.0  0.0 - 0.1 K/uL  URINALYSIS, ROUTINE W REFLEX MICROSCOPIC      Result Value Range   Color, Urine YELLOW  YELLOW   APPearance CLEAR  CLEAR   Specific Gravity, Urine 1.025  1.005 - 1.030   pH 5.5  5.0 - 8.0   Glucose, UA NEGATIVE  NEGATIVE mg/dL   Hgb urine dipstick NEGATIVE  NEGATIVE   Bilirubin Urine NEGATIVE  NEGATIVE   Ketones, ur NEGATIVE  NEGATIVE mg/dL   Protein, ur NEGATIVE  NEGATIVE mg/dL   Urobilinogen, UA  0.2  0.0 - 1.0 mg/dL   Nitrite NEGATIVE  NEGATIVE   Leukocytes, UA NEGATIVE  NEGATIVE    Dg Chest 2 View  06/14/2013   *RADIOLOGY REPORT*  Clinical Data: Cough and generalized body aches.  CHEST - 2 VIEW  Comparison: 04/25/2013  Findings: Lungs are clear.  The cardiomediastinal silhouette and remainder of the exam is unchanged.  IMPRESSION: No acute cardiopulmonary disease.   Original Report Authenticated By: Elberta Fortis, M.D.     No diagnosis found.    MDM  Patient in no acute distress.  Screening tests show no acute anomalies.  Rx doxycycline for purulent sputum.  TSH pending. Patient has primary care followup.     I personally performed the services described in this documentation, which was scribed in my presence. The recorded information has been reviewed and is accurate.    [name] Hutching, MD 06/14/13 1114

## 2013-06-14 NOTE — Discharge Instructions (Signed)
Bronchitis Bronchitis is a problem of the air tubes leading to your lungs. This problem makes it hard for air to get in and out of the lungs. You may cough a lot because your air tubes are narrow. Going without care can cause lasting (chronic) bronchitis. HOME CARE   Drink enough fluids to keep your pee (urine) clear or pale yellow.  Use a cool mist humidifier.  Quit smoking if you smoke. If you keep smoking, the bronchitis might not get better.  Only take medicine as told by your doctor. GET HELP RIGHT AWAY IF:   Coughing keeps you awake.  You start to wheeze.  You become more sick or weak.  You have a hard time breathing or get short of breath.  You cough up blood.  Coughing lasts more than 2 weeks.  You have a fever.  Your baby is older than 3 months with a rectal temperature of 102 F (38.9 C) or higher.  Your baby is 17 months old or younger with a rectal temperature of 100.4 F (38 C) or higher. MAKE SURE YOU:  Understand these instructions.  Will watch your condition.  Will get help right away if you are not doing well or get worse. Document Released: 06/02/2008 Document Revised: 03/08/2012 Document Reviewed: 11/16/2009 [facility] Patient Information 2014 [place], [place].   Tests were good. Thyroid blood work pending.  Antibiotic for chest cold.  Also prescription for inhaler. Followup your primary care Dr.

## 2013-06-14 NOTE — ED Notes (Signed)
Called into room, pt states that the pain medication is not working, pt sitting up on stretcher, applying lipstick, drinking ginger ale, tolerating po fluids well  Dr. Adriana Simas notified, additional orders given.

## 2013-06-22 ENCOUNTER — Other Ambulatory Visit (HOSPITAL_COMMUNITY): Payer: Medicare PPO

## 2013-06-23 ENCOUNTER — Other Ambulatory Visit (HOSPITAL_COMMUNITY): Payer: Medicare PPO

## 2013-06-27 ENCOUNTER — Ambulatory Visit (HOSPITAL_COMMUNITY): Admission: RE | Admit: 2013-06-27 | Payer: Medicare PPO | Source: Ambulatory Visit

## 2013-07-07 ENCOUNTER — Ambulatory Visit (HOSPITAL_COMMUNITY): Admission: RE | Admit: 2013-07-07 | Payer: Medicare PPO | Source: Ambulatory Visit

## 2013-07-21 ENCOUNTER — Ambulatory Visit: Payer: Medicare PPO | Admitting: Gastroenterology

## 2013-08-18 ENCOUNTER — Ambulatory Visit: Payer: Medicare PPO | Admitting: Gastroenterology

## 2013-09-15 ENCOUNTER — Encounter: Payer: Self-pay | Admitting: Gastroenterology

## 2013-09-15 ENCOUNTER — Other Ambulatory Visit: Payer: Self-pay

## 2013-09-15 ENCOUNTER — Telehealth: Payer: Self-pay

## 2013-09-15 ENCOUNTER — Ambulatory Visit (INDEPENDENT_AMBULATORY_CARE_PROVIDER_SITE_OTHER): Payer: Medicare PPO | Admitting: Gastroenterology

## 2013-09-15 VITALS — BP 133/82 | HR 86 | Temp 97.6°F | Ht 62.0 in | Wt 169.0 lb

## 2013-09-15 DIAGNOSIS — R1310 Dysphagia, unspecified: Secondary | ICD-10-CM

## 2013-09-15 DIAGNOSIS — D3A020 Benign carcinoid tumor of the appendix: Secondary | ICD-10-CM

## 2013-09-15 DIAGNOSIS — D369 Benign neoplasm, unspecified site: Secondary | ICD-10-CM

## 2013-09-15 DIAGNOSIS — Z86010 Personal history of colonic polyps: Secondary | ICD-10-CM

## 2013-09-15 DIAGNOSIS — C181 Malignant neoplasm of appendix: Secondary | ICD-10-CM

## 2013-09-15 MED ORDER — OMEPRAZOLE 20 MG PO CPDR: 20.0000 mg | DELAYED_RELEASE_CAPSULE | Freq: Every day | ORAL | Status: DC

## 2013-09-15 MED ORDER — PEG-KCL-NACL-NASULF-NA ASC-C 100 G PO SOLR: 1.0000 | ORAL | Status: DC

## 2013-09-15 NOTE — Telephone Encounter (Signed)
[name], will you please make sure the pt got my message tomorrow, if she doesn't call back this afternoon, since I will not be here tomorrow. Thanks!

## 2013-09-15 NOTE — Assessment & Plan Note (Signed)
Found incidentally at time of appendectomy, s/p hemicolectomy. Continue f/u with oncology.

## 2013-09-15 NOTE — Telephone Encounter (Signed)
I left vm for pt that we have to cancel the appt on 09/19/2013 because [facility] wants pt done in the OR and [facility] only does those on Thurs. Nash Dimmer and had her cancel the procedure for 09/19/2013.

## 2013-09-15 NOTE — Patient Instructions (Addendum)
We have scheduled you for a colonoscopy, upper endoscopy and dilation with Dr. Rourk in the near future.  Further recommendations to follow!   

## 2013-09-15 NOTE — Progress Notes (Signed)
Referring Provider: Fredirick Maudlin, MD Primary Care Physician:  Fredirick Maudlin, MD Primary GI: Dr. Jena Gauss   Chief Complaint  Patient presents with  . Colonoscopy  . EGD    HPI:   [name] is a pleasant 52 year old female presenting today to schedule a surveillance colonoscopy, EGD and dilation. 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 She then underwent an exploratory surgery with appendectomy due to chronic abdominal pain. 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 She then underwent a hemicolectomy.   Right-sided abdominal discomfort intermittent. Nuts make worse. Just comes and goes. Can sometimes last 2-3 days. BM about every day, denies constipation. No rectal bleeding. Recurrent dysphagia about a month ago. Pill dysphagia, extreme temperatures worsen. Lots of GERD. Makes chest hurt. No odynophagia. Good appetite. Trying to lose weight. She is taking Prevacid 30 mg OTC. Not cutting it.   Past Medical History  Diagnosis Date  . Arthritis   . COPD (chronic obstructive pulmonary disease)   . Bronchitis   . Fibromyalgia   . Interstitial cystitis   . Migraine   . Depression with anxiety   . UTI (lower urinary tract infection)   . RLS (restless legs syndrome)   . Alopecia, unspecified   . Cervicalgia   . Tobacco use disorder   . PTSD (post-traumatic stress disorder)   . IBS (irritable bowel syndrome)   . GERD (gastroesophageal reflux disease)   . Dysphagia   . PONV (postoperative nausea and vomiting)   . Shortness of breath   . Anxiety   . Carcinoid tumor of appendix   . Cancer     carcinoid tumor of appendix  . 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 Reoperation for her lymph node dissection revealed all nodes to be absolutely negative.   . Kidney stones   . Renal failure     Past Surgical History  Procedure Laterality Date  . Abdominal hysterectomy    . Cholecystectomy    . Tonsillectomy    . Cystostomy w/ bladder dilation      x 52 [place]  . Esophageal dilation  01/01/2012    RMR: Normal esophagus - status post dilation with 54 F [place] dilation, gastric polyps benign path  . Laparoscopic appendectomy  01/05/2012    Procedure: APPENDECTOMY LAPAROSCOPIC;  Surgeon: Dalia Heading;  Location: [facility];  Service: General;  Laterality: N/A;  . Appendectomy  01/05/2012    with incidental finding of carcinoid  . Givens capsule study  01/27/2012    normal  . Colon resection  01/30/2012    Procedure: HAND ASSISTED LAPAROSCOPIC COLON RESECTION;  Surgeon: Dalia Heading, MD;  Location: [facility];  Service: General;  Laterality: N/A;  . Partial colectomy  01/30/2012    Procedure: PARTIAL COLECTOMY;  Surgeon: Dalia Heading, MD;  Location: [facility];  Service: General;  Laterality: N/A;  . Hemicolectomy    . Ileo-colonoscopy  01/01/12    RMR: multiple colonic polyps, largest polyp in proximal sigmoid colon. Tubulovillous adenoma with high grade dysplasia, tubular adenoma  . Abdominal surgery    . Colon surgery    . Cardiac surgery      Current Outpatient  Prescriptions  Medication Sig Dispense Refill  . albuterol (PROVENTIL HFA;VENTOLIN HFA) 108 (90 BASE) MCG/ACT inhaler Inhale 1-2 puffs into the lungs every 6 (six) hours as needed for wheezing.  1 Inhaler  0  . albuterol (PROVENTIL) (2.5 MG/3ML) 0.083% nebulizer solution Take 2.5 mg by nebulization every 6 (six) hours as needed. Shortness of breath.      . ALPRAZolam (XANAX) 1 MG tablet Take 1 mg by mouth at bedtime. For anxiety and sleep      . amphetamine-dextroamphetamine (ADDERALL) 20 MG tablet Take 10 mg by mouth 2 (two) times daily. For attention      . cetirizine (ZYRTEC) 10  MG tablet Take 10 mg by mouth every morning.       . cycloSPORINE (RESTASIS) 0.05 % ophthalmic emulsion Place 1 drop into both eyes 2 (two) times daily as needed. For dry eyes      . estradiol (VIVELLE-DOT) 0.1 MG/24HR Place 1 patch onto the skin every 3 (three) days.      Marland Kitchen FLUoxetine (PROZAC) 40 MG capsule Take 40 mg by mouth every morning.       . furosemide (LASIX) 40 MG tablet Take 40 mg by mouth.      . lansoprazole (PREVACID) 15 MG capsule Take 30 mg by mouth daily.      Marland Kitchen oxyCODONE (OXYCONTIN) 10 MG 12 hr tablet Take 10 mg by mouth every 6 (six) hours as needed. Pain.      . potassium chloride SA (K-DUR,KLOR-CON) 20 MEQ tablet Take 20 mEq by mouth daily.       . VENTOLIN HFA 108 (90 BASE) MCG/ACT inhaler Inhale 2 puffs into the lungs every 6 (six) hours as needed. For shortness of breath      . doxycycline (VIBRAMYCIN) 100 MG capsule Take 1 capsule (100 mg total) by mouth 2 (two) times daily. One po bid x 7 days  20 capsule  0  . omeprazole (PRILOSEC) 20 MG capsule Take 1 capsule (20 mg total) by mouth daily.  30 capsule  3  . peg 3350 powder (MOVIPREP) 100 G SOLR Take 1 kit (200 g total) by mouth as directed.  1 kit  0  . pramipexole (MIRAPEX) 0.5 MG tablet Take 0.5 mg by mouth at bedtime.       . topiramate (TOPAMAX) 50 MG tablet Take 50 mg by mouth at bedtime.       . triamterene-hydrochlorothiazide (MAXZIDE) 75-50 MG per tablet Take 1 tablet by mouth daily.        No current facility-administered medications for this visit.    Allergies as of 09/15/2013 - Review Complete 09/15/2013  Allergen Reaction Noted  . Ativan [lorazepam] Other (See Comments) 01/29/2012  . Clarithromycin Itching and Nausea And Vomiting   . Ibuprofen Other (See Comments) 01/08/2013  . Pseudoephedrine Hives   . Reglan [metoclopramide]  06/14/2013  . Stadol [butorphanol tartrate] Nausea And Vomiting 07/19/2012  . Sulfa antibiotics Nausea And Vomiting 08/28/2011  . Toradol [ketorolac tromethamine] Other (See  Comments) 07/30/2012  . Factive [gemifloxacin mesylate] Rash 08/28/2011  . Other Rash 07/19/2012  . Prochlorperazine edisylate Anxiety   . Promethazine hcl Anxiety   . Tofranil-pm Rash 01/02/2012  . Trimethobenzamide hcl Anxiety     Family History  Problem Relation Age of Onset  . Coronary artery disease Mother   . Heart attack Mother   . Pulmonary embolism Mother   . Alcohol abuse Father   . COPD Father   . Lung disease Father   .  Anesthesia problems Neg Hx   . Hypotension Neg Hx   . Malignant hyperthermia Neg Hx   . Pseudochol deficiency Neg Hx   . Other Neg Hx     History   Social History  . Marital Status: Married    Spouse Name: N/A    Number of Children: 1  . Years of Education: N/A   Occupational History  . disabled    Social History Main Topics  . Smoking status: Former Smoker -- 0.25 packs/day for 8 years    Types: Cigarettes    Quit date: 01/25/2012  . Smokeless tobacco: Never Used  . Alcohol Use: No  . Drug Use: No  . Sexual Activity: Yes    Birth Control/ Protection: Surgical   Other Topics Concern  . None   Social History Narrative  . None    Review of Systems: Negative unless mentioned in HPI.   Physical Exam: BP 133/82  Pulse 86  Temp(Src) 97.6 F (36.4 C) (Oral)  Ht 5\' 2"  (1.575 m)  Wt 169 lb (76.658 kg)  BMI 30.9 kg/m2  LMP 12/29/1993 General:   Alert and oriented. No distress noted. Pleasant and cooperative.  Head:  Normocephalic and atraumatic. Eyes:  Conjuctiva clear without scleral icterus. Mouth:  Oral mucosa pink and moist. Good dentition. No lesions. Neck:  Supple, without mass or thyromegaly. Heart:  S1, S2 present without murmurs, rubs, or gallops. Regular rate and rhythm. Abdomen:  +BS, soft, non-tender and non-distended. No rebound or guarding. No HSM or masses noted. Msk:  Symmetrical without gross deformities. Normal posture. Extremities:  Without edema. Neurologic:  Alert and  oriented x4;  grossly normal  neurologically. Skin:  Intact without significant lesions or rashes. Cervical Nodes:  No significant cervical adenopathy. Psych:  Alert and cooperative. Normal mood and affect.

## 2013-09-15 NOTE — Assessment & Plan Note (Signed)
Recurrent esophageal dysphagia with pills and certain temperatures. Prevacid OTC for GERD, without much improvement. She had empiric dilation in Jan 2013 with good results. Will offer a repeat dilation at time of TCS; however, I feel uncontrolled GERD is likely playing a large role here. May also have an underlying motility disorder.   Proceed with upper endoscopy/dilation in the near future with Dr. Jena Gauss. The risks, benefits, and alternatives have been discussed in detail with patient. They have stated understanding and desire to proceed.  Propofol due to polypharmacy and patient request BPE if recurrent dysphagia Stop Prevacid. Start Prilosec 20 mg daily

## 2013-09-15 NOTE — Assessment & Plan Note (Signed)
Last TCS in Jan 2013. Somewhat overdue for 1-year-surveillance per our recommendations. No concerning lower GI symptoms.  Proceed with TCS with Dr. Jena Gauss in near future: the risks, benefits, and alternatives have been discussed with the patient in detail. The patient states understanding and desires to proceed. PROPOFOL due to polypharmacy. Patient desires this as well. Propofol used last procedure.

## 2013-09-16 ENCOUNTER — Other Ambulatory Visit: Payer: Self-pay | Admitting: Internal Medicine

## 2013-09-16 NOTE — Telephone Encounter (Signed)
Patient has been R/S to Thursday Oct 2nd w/RMR and I have spoken with her as well as have mailed her new instructions

## 2013-09-19 ENCOUNTER — Ambulatory Visit: Admit: 2013-09-19 | Payer: Self-pay | Admitting: Internal Medicine

## 2013-09-19 SURGERY — COLONOSCOPY
Anesthesia: Moderate Sedation

## 2013-09-19 NOTE — Progress Notes (Signed)
CC'd to PCP 

## 2013-09-23 ENCOUNTER — Encounter (HOSPITAL_COMMUNITY): Payer: Self-pay | Admitting: Pharmacy Technician

## 2013-09-26 NOTE — Patient Instructions (Signed)
[name]  09/26/2013   Your procedure is scheduled on:  09/29/13  Report to [facility] at [facility].  Call this number if you have problems the morning of surgery: [phone]   Remember:   Do not eat food or drink liquids after midnight.   Take these medicines the morning of surgery with A SIP OF WATER: prozac, prilosec, adderall, oxycontin, xanax, zyrtec.  [place] albuterol inhaler & bring with you.   Do not wear jewelry, make-up or nail polish.  Do not wear lotions, powders, or perfumes. You may wear deodorant.  Do not shave 48 hours prior to surgery. Men may shave face and neck.  Do not bring valuables to the hospital.  [facility] is not responsible                  for any belongings or valuables.               Contacts, dentures or bridgework may not be worn into surgery.  Leave suitcase in the car. After surgery it may be brought to your room.  For patients admitted to the hospital, discharge time is determined by your                treatment team.               Patients discharged the day of surgery will not be allowed to drive  home.  Name and phone number of your driver: family  Special Instructions: N/A   Please read over the following fact sheets that you were given: Surgical Site Infection Prevention and Anesthesia Post-op Instructions   PATIENT INSTRUCTIONS POST-ANESTHESIA  IMMEDIATELY FOLLOWING SURGERY:  Do not drive or operate machinery for the first twenty four hours after surgery.  Do not make any important decisions for twenty four hours after surgery or while taking narcotic pain medications or sedatives.  If you develop intractable nausea and vomiting or a severe headache please notify your doctor immediately.  FOLLOW-UP:  Please make an appointment with your surgeon as instructed. You do not need to follow up with anesthesia unless specifically instructed to do so.  WOUND CARE INSTRUCTIONS (if applicable):  Keep a dry clean dressing on the anesthesia/puncture  wound site if there is drainage.  Once the wound has quit draining you may leave it open to air.  Generally you should leave the bandage intact for twenty four hours unless there is drainage.  If the epidural site drains for more than 36-48 hours please call the anesthesia department.  QUESTIONS?:  Please feel free to call your physician or the hospital operator if you have any questions, and they will be happy to assist you.      Esophageal Dilatation The esophagus is the long, narrow tube which carries food and liquid from the mouth to the stomach. Esophageal dilatation is the technique used to stretch a blocked or narrowed portion of the esophagus. This procedure is used when a part of the esophagus has become so narrow that it becomes difficult, painful or even impossible to swallow. This is generally an uncomplicated form of treatment. When this is not successful, chest surgery may be required. This is a much more extensive form of treatment with a longer recovery time. CAUSES  Some of the more common causes of blockage or strictures of the esophagus are:  Narrowing from longstanding inflammation (soreness and redness) of the lower esophagus. This comes from the constant exposure of the lower esophagus  to the acid which bubbles up from the stomach. Over time this causes scarring and narrowing of the lower esophagus.  Hiatal hernia in which a small part of the stomach bulges (herniates) up through the diaphragm. This can cause a gradual narrowing of the end of the esophagus.  Schatzki's Ring is a narrow ring of benign (non-cancerous) fibrous tissue which constricts the lower esophagus. The reason for this is not known.  Scleroderma is a connective tissue disorder that affects the esophagus and makes swallowing difficult.  Achalasia is an absence of nerves to the lower esophagus and to the esophageal sphincter. This is the circular muscle between the stomach and esophagus that relaxes to allow  food into the stomach. After swallowing, it contracts to keep food in the stomach. This absence of nerves may be congenital (present since birth). This can cause irregular spasms of the lower esophageal muscle. This spasm does not open up to allow food and fluid through. The result is a persistent blockage with subsequent slow trickling of the esophageal contents into the stomach.  Strictures may develop from swallowing materials which damage the esophagus. Some examples are strong acids or alkalis such as lye.  Growths such as benign (non-cancerous) and malignant (cancerous) tumors can block the esophagus.  Heredity (present since birth) causes. DIAGNOSIS  Your caregiver often suspects this problem by taking a medical history. They will also do a physical exam. They can then prove their suspicions using X-rays and endoscopy. Endoscopy is an exam in which a tube like a small flexible telescope is used to look at your esophagus.  TREATMENT There are different stretching (dilating) techniques which can be used. Simple bougie dilatation may be done in the office. This usually takes only a couple minutes. A numbing (anesthetic) spray of the throat is used. Endoscopy, when done, is done in an endoscopy suite, under mild sedation. When fluoroscopy is used, the procedure is performed in X-ray. Other techniques require a little longer time. Recovery is usually quick. There is no waiting time to begin eating and drinking to test success of the treatment. Following are some of the methods used. Narrowing of the esophagus is treated by making it bigger. Commonly this is a mechanical problem which can be treated with stretching. This can be done in different ways. Your caregiver will discuss these with you. Some of the means used are:  A series of graduated (increasing thickness) flexible dilators can be used. These are weighted tubes passed through the esophagus into the stomach. The tubes used become  progressively larger until the desired stretched size is reached. Graduated dilators are a simple and quick way of opening the esophagus. No visualization is required.  Another method is the use of endoscopy to place a flexible wire across the stricture. The endoscope is removed and the wire left in place. A dilator with a hole through it from end to end is guided down the esophagus and across the stricture. One or more of these dilators are passed over the wire. At the end of the exam, the wire is removed. This type of treatment may be performed in the X-ray department under fluoroscopy. An advantage of this procedure is the examiner is visualizing the end opening in the esophagus.  Stretching of the esophagus may be done using balloons. Deflated balloons are placed through the endoscope and across the stricture. This type of balloon dilatation is often done at the time of endoscopy or fluoroscopy. Flexible endoscopy allows the examiner to directly  view the stricture. A balloon is inserted in the deflated form into the area of narrowing. It is then inflated with air to a certain pressure that is pre-set for a given circumference. When inflated, it becomes sausage shaped, stretched, and makes the stricture larger.  Achalasia requires a longer larger balloon-type dilator. This is frequently done under X-ray control. In this situation, the spastic muscle fibers in the lower esophagus are stretched. All of the above procedures make the passage of food and water into the stomach easier. They also make it easier for stomach contents to reflux back into the esophagus. Special medications may be used following the procedure to help prevent further stricturing. Proton-pump inhibitor medications are good at decreasing the amount of acid in the stomach juice. When stomach juice refluxes into the esophagus, the juice is no longer as acidic and is less likely to burn or scar the esophagus. RISKS AND  COMPLICATIONS Esophageal dilatation is usually performed effectively and without problems. Some complications that can occur are:  A small amount of bleeding almost always happens where the stretching takes place. If this is too excessive it may require more aggressive treatment.  An uncommon complication is perforation (making a hole) of the esophagus. The esophagus is thin. It is easy to make a hole in it. If this happens, an operation may be necessary to repair this.  A small, undetected perforation could lead to an infection in the chest. This can be very serious. HOME CARE INSTRUCTIONS   If you received sedation for your procedure, do not drive, make important decisions, or perform any activities requiring your full coordination. Do not drink alcohol, take sedatives, or use any mind altering chemicals unless instructed by your caregiver.  You may use throat lozenges or warm salt water gargles if you have throat discomfort  You can begin eating and drinking normally on return home unless instructed otherwise. Do not purposely try to force large chunks of food down to test the benefits of your procedure.  Mild discomfort can be eased with sips of ice water.  Medications for discomfort may or may not be needed. SEEK IMMEDIATE MEDICAL CARE IF:   You begin vomiting up blood.  You develop black tarry stools  You develop chills or an unexplained temperature of over 101 F (38.3 C)  You develop chest or abdominal pain.  You develop shortness of breath or feel lightheaded or faint.  Your swallowing is becoming more painful, difficult, or you are unable to swallow. MAKE SURE YOU:   Understand these instructions.  Will watch your condition.  Will get help right away if you are not doing well or get worse. Document Released: 02/05/2006 Document Revised: 03/08/2012 Document Reviewed: 03/25/2006 [facility] Patient Information 2014 [place],  [place]. Esophagogastroduodenoscopy Esophagogastroduodenoscopy (EGD) is a procedure to examine the lining of the esophagus, stomach, and first part of the small intestine (duodenum). A long, flexible, lighted tube with a camera attached (endoscope) is inserted down the throat to view these organs. This procedure is done to detect problems or abnormalities, such as inflammation, bleeding, ulcers, or growths, in order to treat them. The procedure lasts about 5 20 minutes. It is usually an outpatient procedure, but it may need to be performed in emergency cases in the hospital. LET YOUR CAREGIVER KNOW ABOUT:   Allergies to food or medicine.  All medicines you are taking, including vitamins, herbs, eyedrops, and over-the-counter medicines and creams.  Use of steroids (by mouth or creams).  Previous problems you  or members of your family have had with the use of anesthetics.  Any blood disorders you have.  Previous surgeries you have had.  Other health problems you have.  Possibility of pregnancy, if this applies. RISKS AND COMPLICATIONS  Generally, EGD is a safe procedure. However, as with any procedure, complications can occur. Possible complications include:  Infection.  Bleeding.  Tearing (perforation) of the esophagus, stomach, or duodenum.  Difficulty breathing or not being able to breath.  Excessive sweating.  Spasms of the larynx.  Slowed heartbeat.  Low blood pressure. BEFORE THE PROCEDURE  Do not eat or drink anything for 6 8 hours before the procedure or as directed by your caregiver.  Ask your caregiver about changing or stopping your regular medicines.  If you wear dentures, be prepared to remove them before the procedure.  Arrange for someone to drive you home after the procedure. PROCEDURE   A vein will be accessed to give medicines and fluids. A medicine to relax you (sedative) and a pain reliever will be given through that access into the vein.  A numbing  medicine (local anesthetic) may be sprayed on your throat for comfort and to stop you from gagging or coughing.  A mouth guard may be placed in your mouth to protect your teeth and to keep you from biting on the endoscope.  You will be asked to lie on your left side.  The endoscope is inserted down your throat and into the esophagus, stomach, and duodenum.  Air is put through the endoscope to allow your caregiver to view the lining of your esophagus clearly.  The esophagus, stomach, and duodenum is then examined. During the exam, your caregiver may:  Remove tissue to be examined under a microscope (biopsy) for inflammation, infection, or other medical problems.  Remove growths.  Remove objects (foreign bodies) that are stuck.  Treat any bleeding with medicines or other devices that stop tissues from bleeding (hot cauters, clipping devices).  Widen (dilate) or stretch narrowed areas of the esophagus and stomach.  The endoscope will then be withdrawn. AFTER THE PROCEDURE  You will be taken to a recovery area to be monitored. You will be able to go home once you are stable and alert.  Do not eat or drink anything until the local anesthetic and numbing medicines have worn off. You may choke.  It is normal to feel bloated, have pain with swallowing, or have a sore throat for a short time. This will wear off.  Your caregiver should be able to discuss his or her findings with you. It will take longer to discuss the test results if any biopsies were taken. Document Released: 04/17/2005 Document Revised: 12/01/2012 Document Reviewed: 11/17/2012 [facility] Patient Information 2014 [place], [place]. Colonoscopy A colonoscopy is an exam to evaluate your entire colon. In this exam, your colon is cleansed. A long fiberoptic tube is inserted through your rectum and into your colon. The fiberoptic scope (endoscope) is a long bundle of enclosed and very flexible fibers. These fibers transmit light to  the area examined and send images from that area to your caregiver. Discomfort is usually minimal. You may be given a drug to help you sleep (sedative) during or prior to the procedure. This exam helps to detect lumps (tumors), polyps, inflammation, and areas of bleeding. Your caregiver may also take a small piece of tissue (biopsy) that will be examined under a microscope. LET YOUR CAREGIVER KNOW ABOUT:   Allergies to food or medicine.  Medicines  taken, including vitamins, herbs, eyedrops, over-the-counter medicines, and creams.  Use of steroids (by mouth or creams).  Previous problems with anesthetics or numbing medicines.  History of bleeding problems or blood clots.  Previous surgery.  Other health problems, including diabetes and kidney problems.  Possibility of pregnancy, if this applies. BEFORE THE PROCEDURE   A clear liquid diet may be required for 2 days before the exam.  Ask your caregiver about changing or stopping your regular medications.  Liquid injections (enemas) or laxatives may be required.  A large amount of electrolyte solution may be given to you to drink over a short period of time. This solution is used to clean out your colon.  You should be present 60 minutes prior to your procedure or as directed by your caregiver. AFTER THE PROCEDURE   If you received a sedative or pain relieving medication, you will need to arrange for someone to drive you home.  Occasionally, there is a little blood passed with the first bowel movement. Do not be concerned. FINDING OUT THE RESULTS OF YOUR TEST Not all test results are available during your visit. If your test results are not back during the visit, make an appointment with your caregiver to find out the results. Do not assume everything is normal if you have not heard from your caregiver or the medical facility. It is important for you to follow up on all of your test results. HOME CARE INSTRUCTIONS   It is not  unusual to pass moderate amounts of gas and experience mild abdominal cramping following the procedure. This is due to air being used to inflate your colon during the exam. Walking or a warm pack on your belly (abdomen) may help.  You may resume all normal meals and activities after sedatives and medicines have worn off.  Only take over-the-counter or prescription medicines for pain, discomfort, or fever as directed by your caregiver. Do not use aspirin or blood thinners if a biopsy was taken. Consult your caregiver for medicine usage if biopsies were taken. SEEK IMMEDIATE MEDICAL CARE IF:   You have a fever.  You pass large blood clots or fill a toilet with blood following the procedure. This may also occur 10 to 14 days following the procedure. This is more likely if a biopsy was taken.  You develop abdominal pain that keeps getting worse and cannot be relieved with medicine. Document Released: 12/12/2000 Document Revised: 03/08/2012 Document Reviewed: 07/27/2008 [facility] Patient Information 2014 [place], [place].

## 2013-09-27 ENCOUNTER — Encounter (HOSPITAL_COMMUNITY)
Admission: RE | Admit: 2013-09-27 | Discharge: 2013-09-27 | Disposition: A | Discharge status: Home / Self Care | Payer: Medicare PPO | Source: Ambulatory Visit | Attending: Internal Medicine | Admitting: Internal Medicine

## 2013-09-27 ENCOUNTER — Encounter (HOSPITAL_COMMUNITY): Payer: Self-pay

## 2013-09-29 ENCOUNTER — Ambulatory Visit (HOSPITAL_COMMUNITY): Payer: Medicare PPO | Admitting: Anesthesiology

## 2013-09-29 ENCOUNTER — Encounter (HOSPITAL_COMMUNITY)
Admission: RE | Disposition: A | Discharge status: Home / Self Care | Payer: Self-pay | Source: Ambulatory Visit | Attending: Internal Medicine

## 2013-09-29 ENCOUNTER — Ambulatory Visit (HOSPITAL_COMMUNITY)
Admission: RE | Admit: 2013-09-29 | Discharge: 2013-09-29 | Disposition: A | Discharge status: Home / Self Care | Payer: Medicare PPO | Source: Ambulatory Visit | Attending: Internal Medicine | Admitting: Internal Medicine

## 2013-09-29 ENCOUNTER — Encounter (HOSPITAL_COMMUNITY): Payer: Self-pay | Admitting: Anesthesiology

## 2013-09-29 ENCOUNTER — Encounter (HOSPITAL_COMMUNITY): Payer: Self-pay | Admitting: *Deleted

## 2013-09-29 DIAGNOSIS — K222 Esophageal obstruction: Secondary | ICD-10-CM

## 2013-09-29 DIAGNOSIS — K620 Anal polyp: Secondary | ICD-10-CM

## 2013-09-29 DIAGNOSIS — K621 Rectal polyp: Secondary | ICD-10-CM

## 2013-09-29 DIAGNOSIS — K449 Diaphragmatic hernia without obstruction or gangrene: Secondary | ICD-10-CM

## 2013-09-29 DIAGNOSIS — K219 Gastro-esophageal reflux disease without esophagitis: Secondary | ICD-10-CM | POA: Insufficient documentation

## 2013-09-29 DIAGNOSIS — Z9049 Acquired absence of other specified parts of digestive tract: Secondary | ICD-10-CM | POA: Insufficient documentation

## 2013-09-29 DIAGNOSIS — R1310 Dysphagia, unspecified: Secondary | ICD-10-CM | POA: Insufficient documentation

## 2013-09-29 DIAGNOSIS — J449 Chronic obstructive pulmonary disease, unspecified: Secondary | ICD-10-CM | POA: Insufficient documentation

## 2013-09-29 DIAGNOSIS — Z860100 Personal history of colon polyps, unspecified: Secondary | ICD-10-CM | POA: Insufficient documentation

## 2013-09-29 DIAGNOSIS — D126 Benign neoplasm of colon, unspecified: Secondary | ICD-10-CM

## 2013-09-29 DIAGNOSIS — J4489 Other specified chronic obstructive pulmonary disease: Secondary | ICD-10-CM | POA: Insufficient documentation

## 2013-09-29 DIAGNOSIS — D128 Benign neoplasm of rectum: Secondary | ICD-10-CM | POA: Insufficient documentation

## 2013-09-29 DIAGNOSIS — R1311 Dysphagia, oral phase: Secondary | ICD-10-CM

## 2013-09-29 HISTORY — PX: COLONOSCOPY WITH PROPOFOL: SHX5780

## 2013-09-29 HISTORY — PX: MALONEY DILATION: SHX5535

## 2013-09-29 HISTORY — PX: ESOPHAGOGASTRODUODENOSCOPY (EGD) WITH PROPOFOL: SHX5813

## 2013-09-29 HISTORY — PX: POLYPECTOMY: SHX5525

## 2013-09-29 SURGERY — COLONOSCOPY WITH PROPOFOL
Anesthesia: Monitor Anesthesia Care | Site: Rectum

## 2013-09-29 MED ORDER — FENTANYL CITRATE 0.05 MG/ML IJ SOLN: 25.0000 ug | INTRAMUSCULAR | Status: DC | PRN

## 2013-09-29 MED ORDER — FENTANYL CITRATE 0.05 MG/ML IJ SOLN
INTRAMUSCULAR | Status: AC
  Filled 2013-09-29: qty 2

## 2013-09-29 MED ORDER — PROPOFOL 10 MG/ML IV EMUL
INTRAVENOUS | Status: AC
  Filled 2013-09-29: qty 20

## 2013-09-29 MED ORDER — LACTATED RINGERS IV SOLN
INTRAVENOUS | Status: DC | PRN
  Administered 2013-09-29: 08:00:00 via INTRAVENOUS

## 2013-09-29 MED ORDER — PROPOFOL 10 MG/ML IV EMUL
INTRAVENOUS | Status: AC
  Filled 2013-09-29: qty 60

## 2013-09-29 MED ORDER — PROPOFOL INFUSION 10 MG/ML OPTIME
INTRAVENOUS | Status: DC | PRN
  Administered 2013-09-29: 180 ug/kg/min via INTRAVENOUS

## 2013-09-29 MED ORDER — GLYCOPYRROLATE 0.2 MG/ML IJ SOLN
INTRAMUSCULAR | Status: AC
  Filled 2013-09-29: qty 1

## 2013-09-29 MED ORDER — BUTAMBEN-TETRACAINE-BENZOCAINE 2-2-14 % EX AERO
1.0000 | INHALATION_SPRAY | Freq: Once | CUTANEOUS | Status: AC
  Administered 2013-09-29: 1 via TOPICAL
  Filled 2013-09-29: qty 56

## 2013-09-29 MED ORDER — FENTANYL CITRATE 0.05 MG/ML IJ SOLN
25.0000 ug | INTRAMUSCULAR | Status: AC
  Administered 2013-09-29: 25 ug via INTRAVENOUS

## 2013-09-29 MED ORDER — MIDAZOLAM HCL 2 MG/2ML IJ SOLN
1.0000 mg | INTRAMUSCULAR | Status: DC | PRN
Start: 2013-09-29 — End: 2013-09-29
  Administered 2013-09-29: 2 mg via INTRAVENOUS

## 2013-09-29 MED ORDER — WATER FOR IRRIGATION, STERILE IR SOLN
Status: DC | PRN
  Administered 2013-09-29: 1000 mL

## 2013-09-29 MED ORDER — ONDANSETRON HCL 4 MG/2ML IJ SOLN: 4.0000 mg | Freq: Once | INTRAMUSCULAR | Status: DC | PRN

## 2013-09-29 MED ORDER — LIDOCAINE HCL 1 % IJ SOLN
INTRAMUSCULAR | Status: DC | PRN
  Administered 2013-09-29: 30 mg via INTRADERMAL

## 2013-09-29 MED ORDER — STERILE WATER FOR IRRIGATION IR SOLN
Status: DC | PRN
  Administered 2013-09-29: 09:00:00

## 2013-09-29 MED ORDER — LIDOCAINE HCL (PF) 1 % IJ SOLN
INTRAMUSCULAR | Status: AC
  Filled 2013-09-29: qty 5

## 2013-09-29 MED ORDER — ONDANSETRON HCL 4 MG/2ML IJ SOLN
INTRAMUSCULAR | Status: AC
  Filled 2013-09-29: qty 2

## 2013-09-29 MED ORDER — GLYCOPYRROLATE 0.2 MG/ML IJ SOLN
0.2000 mg | Freq: Once | INTRAMUSCULAR | Status: AC
  Administered 2013-09-29: 0.2 mg via INTRAVENOUS

## 2013-09-29 MED ORDER — ONDANSETRON HCL 4 MG/2ML IJ SOLN
4.0000 mg | Freq: Once | INTRAMUSCULAR | Status: AC
  Administered 2013-09-29: 4 mg via INTRAVENOUS

## 2013-09-29 MED ORDER — LACTATED RINGERS IV SOLN
INTRAVENOUS | Status: DC
  Administered 2013-09-29: 08:00:00 via INTRAVENOUS

## 2013-09-29 MED ORDER — MIDAZOLAM HCL 2 MG/2ML IJ SOLN
INTRAMUSCULAR | Status: AC
  Filled 2013-09-29: qty 2

## 2013-09-29 SURGICAL SUPPLY — 25 items
BLOCK BITE 60FR ADLT L/F BLUE (MISCELLANEOUS) ×4 IMPLANT
DEVICE CLIP HEMOSTAT 235CM (CLIP) IMPLANT
ELECT REM PT RETURN 9FT ADLT (ELECTROSURGICAL) ×4
ELECTRODE REM PT RTRN 9FT ADLT (ELECTROSURGICAL) ×3 IMPLANT
FCP BXJMBJMB 240X2.8X (CUTTING FORCEPS)
FLOOR PAD 36X40 (MISCELLANEOUS) ×4
FORCEPS BIOP RAD 4 LRG CAP 4 (CUTTING FORCEPS) IMPLANT
FORCEPS BIOP RJ4 240 W/NDL (CUTTING FORCEPS)
FORCEPS BXJMBJMB 240X2.8X (CUTTING FORCEPS) IMPLANT
FORMALIN 10 PREFIL 20ML (MISCELLANEOUS) ×4 IMPLANT
INJECTOR/SNARE I SNARE (MISCELLANEOUS) IMPLANT
LUBRICANT JELLY 4.5OZ STERILE (MISCELLANEOUS) ×4 IMPLANT
MANIFOLD NEPTUNE II (INSTRUMENTS) ×4 IMPLANT
NEEDLE SCLEROTHERAPY 25GX240 (NEEDLE) ×4 IMPLANT
PAD FLOOR 36X40 (MISCELLANEOUS) ×3 IMPLANT
PROBE APC STR FIRE (PROBE) ×4 IMPLANT
PROBE INJECTION GOLD (MISCELLANEOUS) ×1
PROBE INJECTION GOLD 7FR (MISCELLANEOUS) ×3 IMPLANT
SNARE ROTATE MED OVAL 20MM (MISCELLANEOUS) ×4 IMPLANT
SPONGE GAUZE 4X4 12PLY (GAUZE/BANDAGES/DRESSINGS) ×4 IMPLANT
SYR 50ML LL SCALE MARK (SYRINGE) ×4 IMPLANT
TRAP SPECIMEN MUCOUS 40CC (MISCELLANEOUS) ×4 IMPLANT
TUBING ENDO SMARTCAP PENTAX (MISCELLANEOUS) ×4 IMPLANT
TUBING IRRIGATION ENDOGATOR (MISCELLANEOUS) ×4 IMPLANT
WATER STERILE IRR 1000ML POUR (IV SOLUTION) ×4 IMPLANT

## 2013-09-29 NOTE — H&P (View-Only) (Signed)
Referring Provider: Fredirick Maudlin, MD Primary Care Physician:  Fredirick Maudlin, MD Primary GI: Dr. Jena Gauss   Chief Complaint  Patient presents with  . Colonoscopy  . EGD    HPI:   [name] is a pleasant 52 year old female presenting today to schedule a surveillance colonoscopy, EGD and dilation. 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 She then underwent an exploratory surgery with appendectomy due to chronic abdominal pain. 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 She then underwent a hemicolectomy.   Right-sided abdominal discomfort intermittent. Nuts make worse. Just comes and goes. Can sometimes last 2-3 days. BM about every day, denies constipation. No rectal bleeding. Recurrent dysphagia about a month ago. Pill dysphagia, extreme temperatures worsen. Lots of GERD. Makes chest hurt. No odynophagia. Good appetite. Trying to lose weight. She is taking Prevacid 30 mg OTC. Not cutting it.   Past Medical History  Diagnosis Date  . Arthritis   . COPD (chronic obstructive pulmonary disease)   . Bronchitis   . Fibromyalgia   . Interstitial cystitis   . Migraine   . Depression with anxiety   . UTI (lower urinary tract infection)   . RLS (restless legs syndrome)   . Alopecia, unspecified   . Cervicalgia   . Tobacco use disorder   . PTSD (post-traumatic stress disorder)   . IBS (irritable bowel syndrome)   . GERD (gastroesophageal reflux disease)   . Dysphagia   . PONV (postoperative nausea and vomiting)   . Shortness of breath   . Anxiety   . Carcinoid tumor of appendix   . Cancer     carcinoid tumor of appendix  . 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 Reoperation for her lymph node dissection revealed all nodes to be absolutely negative.   . Kidney stones   . Renal failure     Past Surgical History  Procedure Laterality Date  . Abdominal hysterectomy    . Cholecystectomy    . Tonsillectomy    . Cystostomy w/ bladder dilation      x 52 Throop  . Esophageal dilation  01/01/2012    RMR: Normal esophagus - status post dilation with 54 F Maloney dilation, gastric polyps benign path  . Laparoscopic appendectomy  01/05/2012    Procedure: APPENDECTOMY LAPAROSCOPIC;  Surgeon: Dalia Heading;  Location: [facility];  Service: General;  Laterality: N/A;  . Appendectomy  01/05/2012    with incidental finding of carcinoid  . Givens capsule study  01/27/2012    normal  . Colon resection  01/30/2012    Procedure: HAND ASSISTED LAPAROSCOPIC COLON RESECTION;  Surgeon: Dalia Heading, MD;  Location: [facility];  Service: General;  Laterality: N/A;  . Partial colectomy  01/30/2012    Procedure: PARTIAL COLECTOMY;  Surgeon: Dalia Heading, MD;  Location: [facility];  Service: General;  Laterality: N/A;  . Hemicolectomy    . Ileo-colonoscopy  01/01/12    RMR: multiple colonic polyps, largest polyp in proximal sigmoid colon. Tubulovillous adenoma with high grade dysplasia, tubular adenoma  . Abdominal surgery    . Colon surgery    . Cardiac surgery      Current Outpatient  Prescriptions  Medication Sig Dispense Refill  . albuterol (PROVENTIL HFA;VENTOLIN HFA) 108 (90 BASE) MCG/ACT inhaler Inhale 1-2 puffs into the lungs every 6 (six) hours as needed for wheezing.  1 Inhaler  0  . albuterol (PROVENTIL) (2.5 MG/3ML) 0.083% nebulizer solution Take 2.5 mg by nebulization every 6 (six) hours as needed. Shortness of breath.      . ALPRAZolam (XANAX) 1 MG tablet Take 1 mg by mouth at bedtime. For anxiety and sleep      . amphetamine-dextroamphetamine (ADDERALL) 20 MG tablet Take 10 mg by mouth 2 (two) times daily. For attention      . cetirizine (ZYRTEC) 10  MG tablet Take 10 mg by mouth every morning.       . cycloSPORINE (RESTASIS) 0.05 % ophthalmic emulsion Place 1 drop into both eyes 2 (two) times daily as needed. For dry eyes      . estradiol (VIVELLE-DOT) 0.1 MG/24HR Place 1 patch onto the skin every 3 (three) days.      Marland Kitchen FLUoxetine (PROZAC) 40 MG capsule Take 40 mg by mouth every morning.       . furosemide (LASIX) 40 MG tablet Take 40 mg by mouth.      . lansoprazole (PREVACID) 15 MG capsule Take 30 mg by mouth daily.      Marland Kitchen oxyCODONE (OXYCONTIN) 10 MG 12 hr tablet Take 10 mg by mouth every 6 (six) hours as needed. Pain.      . potassium chloride SA (K-DUR,KLOR-CON) 20 MEQ tablet Take 20 mEq by mouth daily.       . VENTOLIN HFA 108 (90 BASE) MCG/ACT inhaler Inhale 2 puffs into the lungs every 6 (six) hours as needed. For shortness of breath      . doxycycline (VIBRAMYCIN) 100 MG capsule Take 1 capsule (100 mg total) by mouth 2 (two) times daily. One po bid x 7 days  20 capsule  0  . omeprazole (PRILOSEC) 20 MG capsule Take 1 capsule (20 mg total) by mouth daily.  30 capsule  3  . peg 3350 powder (MOVIPREP) 100 G SOLR Take 1 kit (200 g total) by mouth as directed.  1 kit  0  . pramipexole (MIRAPEX) 0.5 MG tablet Take 0.5 mg by mouth at bedtime.       . topiramate (TOPAMAX) 50 MG tablet Take 50 mg by mouth at bedtime.       . triamterene-hydrochlorothiazide (MAXZIDE) 75-50 MG per tablet Take 1 tablet by mouth daily.        No current facility-administered medications for this visit.    Allergies as of 09/15/2013 - Review Complete 09/15/2013  Allergen Reaction Noted  . Ativan [lorazepam] Other (See Comments) 01/29/2012  . Clarithromycin Itching and Nausea And Vomiting   . Ibuprofen Other (See Comments) 01/08/2013  . Pseudoephedrine Hives   . Reglan [metoclopramide]  06/14/2013  . Stadol [butorphanol tartrate] Nausea And Vomiting 07/19/2012  . Sulfa antibiotics Nausea And Vomiting 08/28/2011  . Toradol [ketorolac tromethamine] Other (See  Comments) 07/30/2012  . Factive [gemifloxacin mesylate] Rash 08/28/2011  . Other Rash 07/19/2012  . Prochlorperazine edisylate Anxiety   . Promethazine hcl Anxiety   . Tofranil-pm Rash 01/02/2012  . Trimethobenzamide hcl Anxiety     Family History  Problem Relation Age of Onset  . Coronary artery disease Mother   . Heart attack Mother   . Pulmonary embolism Mother   . Alcohol abuse Father   . COPD Father   . Lung disease Father   .  Anesthesia problems Neg Hx   . Hypotension Neg Hx   . Malignant hyperthermia Neg Hx   . Pseudochol deficiency Neg Hx   . Other Neg Hx     History   Social History  . Marital Status: Married    Spouse Name: N/A    Number of Children: 1  . Years of Education: N/A   Occupational History  . disabled    Social History Main Topics  . Smoking status: Former Smoker -- 0.25 packs/day for 8 years    Types: Cigarettes    Quit date: 01/25/2012  . Smokeless tobacco: Never Used  . Alcohol Use: No  . Drug Use: No  . Sexual Activity: Yes    Birth Control/ Protection: Surgical   Other Topics Concern  . None   Social History Narrative  . None    Review of Systems: Negative unless mentioned in HPI.   Physical Exam: BP 133/82  Pulse 86  Temp(Src) 97.6 F (36.4 C) (Oral)  Ht 5\' 2"  (1.575 m)  Wt 169 lb (76.658 kg)  BMI 30.9 kg/m2  LMP 12/29/1993 General:   Alert and oriented. No distress noted. Pleasant and cooperative.  Head:  Normocephalic and atraumatic. Eyes:  Conjuctiva clear without scleral icterus. Mouth:  Oral mucosa pink and moist. Good dentition. No lesions. Neck:  Supple, without mass or thyromegaly. Heart:  S1, S2 present without murmurs, rubs, or gallops. Regular rate and rhythm. Abdomen:  +BS, soft, non-tender and non-distended. No rebound or guarding. No HSM or masses noted. Msk:  Symmetrical without gross deformities. Normal posture. Extremities:  Without edema. Neurologic:  Alert and  oriented x4;  grossly normal  neurologically. Skin:  Intact without significant lesions or rashes. Cervical Nodes:  No significant cervical adenopathy. Psych:  Alert and cooperative. Normal mood and affect.

## 2013-09-29 NOTE — Anesthesia Preprocedure Evaluation (Signed)
Anesthesia Evaluation  Patient identified by MRN, date of birth, ID band Patient awake    Reviewed: Allergy & Precautions, H&P , NPO status , Patient's Chart, lab work & pertinent test results  History of Anesthesia Complications (+) PONV  Airway Mallampati: I TM Distance: >3 FB Neck ROM: Full    Dental no notable dental hx. (+) Teeth Intact   Pulmonary shortness of breath, asthma , COPD   Pulmonary exam normal       Cardiovascular negative cardio ROS  Rhythm:Regular Rate:Normal     Neuro/Psych  Headaches, PSYCHIATRIC DISORDERS Anxiety Depression  Neuromuscular disease    GI/Hepatic Neg liver ROS, hiatal hernia, GERD-  Medicated and Controlled,  Endo/Other  negative endocrine ROS  Renal/GU negative Renal ROS     Musculoskeletal  (+) Fibromyalgia -  Abdominal Normal abdominal exam  (+)   Peds  Hematology negative hematology ROS (+)   Anesthesia Other Findings   Reproductive/Obstetrics negative OB ROS                           Anesthesia Physical Anesthesia Plan  ASA: III  Anesthesia Plan: MAC   Post-op Pain Management:    Induction: Intravenous  Airway Management Planned: Simple Face Mask  Additional Equipment:   Intra-op Plan:   Post-operative Plan:   Informed Consent: I have reviewed the patients History and Physical, chart, labs and discussed the procedure including the risks, benefits and alternatives for the proposed anesthesia with the patient or authorized representative who has indicated his/her understanding and acceptance.     Plan Discussed with:   Anesthesia Plan Comments:         Anesthesia Quick Evaluation

## 2013-09-29 NOTE — Op Note (Signed)
[facility] [address] [place], [address]   COLONOSCOPY PROCEDURE REPORT  PATIENT: [name], [name]  MR#:         [id number] BIRTHDATE: Feb 06, 1961 , 51  yrs. old GENDER: Female ENDOSCOPIST: R.  Roetta Sessions, MD FACP [facility] REFERRED BY:  Kari Baars, M.D.  Glenford Peers, M.D. PROCEDURE DATE:  09/29/2013 PROCEDURE:     Ileocolonoscopy biopsies snare polypectomy  INDICATIONS: surveillance examination history of appendiceal carcinoid and high-grade adenoma; status post right hemicolectomy  INFORMED CONSENT:  The risks, benefits, alternatives and imponderables including but not limited to bleeding, perforation as well as the possibility of a missed lesion have been reviewed.  The potential for biopsy, lesion removal, etc. have also been discussed.  Questions have been answered.  All parties agreeable. Please see the history and physical in the medical record for more information.  MEDICATIONS: technique sedation per Dr. [facility]  DESCRIPTION OF PROCEDURE:  After a digital rectal exam was performed, the     colonoscope was advanced from the anus through the rectum and colon to the area of the cecum, ileocecal valve and appendiceal orifice.  The cecum was deeply intubated.  These structures were well-seen and photographed for the record.  From the level of the cecum and ileocecal valve, the scope was slowly and cautiously withdrawn.  The mucosal surfaces were carefully surveyed utilizing scope tip deflection to facilitate fold flattening as needed.  The scope was pulled down into the rectum where a thorough examination including retroflexion was performed.     FINDINGS:  Adequate preparation. (1) diminutive polyp in the rectum at 10 cm; otherwise, the remainder of the rectal mucosa appeared normal. Patient status post right hemicolectomy. Normal appearing anastomosis distal 10 cm of neoterminal ileum appeared normal. The patient had (1) 7 mm  been inflated polyp in the mid descending segment; otherwise, the remainder of the residual colonic mucosa appeared normal.  THERAPEUTIC / DIAGNOSTIC MANEUVERS PERFORMED:  The above-mentioned polyps were cold biopsied and hot snare removed, respectively.  COMPLICATIONS: None  CECAL WITHDRAWAL TIME:  not applicable  IMPRESSION:  Rectal and colonic polyps-removed as described above. Status post right hemicolectomy  RECOMMENDATIONS: Followup on pathology. See EGD report.   _______________________________ eSigned:  R. Roetta Sessions, MD FACP [facility] 09/29/2013 9:06 AM   CC:    PATIENT NAME:  [name], [name] MR#: [id number]

## 2013-09-29 NOTE — Op Note (Signed)
[facility] [address] [place], [address]   ENDOSCOPY PROCEDURE REPORT  PATIENT: [name], Walker  MR#: [id number] BIRTHDATE: 12/14/1961 , 51  yrs. old GENDER: Female ENDOSCOPIST: R.  Roetta Sessions, MD FACP [facility] REFERRED BY:  Kari Baars, M.D.  Glenford Peers, M.D. PROCEDURE DATE:  09/29/2013 PROCEDURE:     EGD with Elease Hashimoto dilation  INDICATIONS:     Recurrent esophageal dysphagia; GERD  INFORMED CONSENT:   The risks, benefits, limitations, alternatives and imponderables have been discussed.  The potential for biopsy, esophogeal dilation, etc. have also been reviewed.  Questions have been answered.  All parties agreeable.  Please see the history and physical in the medical record for more information.  MEDICATIONS:     deep sedation per Dr. [facility]  DESCRIPTION OF PROCEDURE:   The     endoscope was introduced through the mouth and advanced to the second portion of the duodenum without difficulty or limitations.  The mucosal surfaces were surveyed very carefully during advancement of the scope and upon withdrawal.  Retroflexion view of the proximal stomach and esophagogastric junction was performed.      FINDINGS: Noncritical. Schatzki's ring, otherwise, normal appearing esophagus. Stomach empty. Small hiatal hernia; otherwise, the gastric mucosa appeared normal. Patent pylorus. Normal first and second portion of the duodenum.  THERAPEUTIC / DIAGNOSTIC MANEUVERS PERFORMED:  A 54 French Maloney dilator was passed to full insertion easily. A look back revealed no. Complication related to passage of the dilator.   COMPLICATIONS:  None  IMPRESSION:  Noncritical appearing Schatzki's ring-status post dilation as described above. Hiatal hernia.  RECOMMENDATIONS:    Increase omeprazole 40 mg daily.  See colonoscopy report.    _______________________________ R. Roetta Sessions, MD FACP [facility] eSigned:  R. Roetta Sessions, MD FACP [facility]  09/29/2013 9:02 AM     CC:

## 2013-09-29 NOTE — Anesthesia Procedure Notes (Signed)
Performed by: Ervin Knack

## 2013-09-29 NOTE — Transfer of Care (Signed)
Immediate Anesthesia Transfer of Care Note  Patient: [name]  Procedure(s) Performed: Procedure(s) with comments: COLONOSCOPY WITH PROPOFOL (N/A) - at anastomosis at [phone] ESOPHAGOGASTRODUODENOSCOPY (EGD) WITH PROPOFOL (N/A) MALONEY DILATION (N/A) - 54 POLYPECTOMY (N/A)  Patient Location: PACU  Anesthesia Type:MAC  Level of Consciousness: awake  Airway & Oxygen Therapy: Patient Spontanous Breathing and Patient connected to nasal cannula oxygen  Post-op Assessment: Report given to PACU RN  Post vital signs: Reviewed and stable  Complications: No apparent anesthesia complications

## 2013-09-29 NOTE — Anesthesia Postprocedure Evaluation (Signed)
  Anesthesia Post-op Note  Patient: [name]  Procedure(s) Performed: Procedure(s) with comments: COLONOSCOPY WITH PROPOFOL (N/A) - at anastomosis at [phone] ESOPHAGOGASTRODUODENOSCOPY (EGD) WITH PROPOFOL (N/A) MALONEY DILATION (N/A) - 54 POLYPECTOMY (N/A)  Patient Location: PACU  Anesthesia Type:MAC  Level of Consciousness: awake, alert  and oriented  Airway and Oxygen Therapy: Patient Spontanous Breathing and Patient connected to nasal cannula oxygen  Post-op Pain: none  Post-op Assessment: Post-op Vital signs reviewed, Patient's Cardiovascular Status Stable, Respiratory Function Stable, Patent Airway and No signs of Nausea or vomiting  Post-op Vital Signs: Reviewed and stable  Complications: No apparent anesthesia complications

## 2013-09-29 NOTE — Interval H&P Note (Signed)
History and Physical Interval Note:  09/29/2013 8:00 AM  [name]  has presented today for surgery, with the diagnosis of Tubulovillous adenoma, Dysphagia and carcinoid of appendix  The various methods of treatment have been discussed with the patient and family. After consideration of risks, benefits and other options for treatment, the patient has consented to  Procedure(s) with comments: COLONOSCOPY WITH PROPOFOL (N/A) - 8:00 ESOPHAGOGASTRODUODENOSCOPY (EGD) WITH PROPOFOL (N/A) SAVORY DILATION (N/A) MALONEY DILATION (N/A) as a surgical intervention .  The patient's history has been reviewed, patient examined, no change in status, stable for surgery.  I have reviewed the patient's chart and labs.  Questions were answered to the patient's satisfaction.     [name]  No change. EGD with dilation and colonoscopy per plan.The risks, benefits, limitations, imponderables and alternatives regarding both EGD and colonoscopy have been reviewed with the patient. Questions have been answered. All parties agreeable.

## 2013-09-29 NOTE — Preoperative (Signed)
Beta Blockers   Reason not to administer Beta Blockers:Not Applicable 

## 2013-09-30 ENCOUNTER — Encounter (HOSPITAL_COMMUNITY): Payer: Self-pay | Admitting: Internal Medicine

## 2013-10-02 ENCOUNTER — Encounter: Payer: Self-pay | Admitting: Internal Medicine

## 2013-10-03 ENCOUNTER — Encounter (HOSPITAL_COMMUNITY): Payer: Medicare PPO | Admitting: Oncology

## 2013-10-04 ENCOUNTER — Telehealth: Payer: Self-pay | Admitting: *Deleted

## 2013-10-04 NOTE — Telephone Encounter (Signed)
Letter was mailed to pt yesterday.

## 2013-10-04 NOTE — Telephone Encounter (Signed)
Pt called wanting to know if her pathology was back, I told pt it would take 7-10 business days for it to come back and Dr. Derrill Kay it, and he will be in touch. Please advise.

## 2013-10-11 ENCOUNTER — Encounter: Payer: Self-pay | Admitting: Internal Medicine

## 2013-10-11 ENCOUNTER — Ambulatory Visit (HOSPITAL_COMMUNITY): Payer: Medicare PPO | Admitting: Oncology

## 2013-10-11 NOTE — Progress Notes (Signed)
-  No show, letter sent-   

## 2013-10-13 ENCOUNTER — Telehealth: Payer: Self-pay | Admitting: Internal Medicine

## 2013-10-13 NOTE — Telephone Encounter (Signed)
Pt called and wants to know why she has to come back for OV in Nov. I told her per RMR that he wanted her to follow up with extender in 6-8 weeks. She said if it was something wrong that she wanted to know today and not wait until 11-25. She thinks she has cancer. I told her if it was something serious RMR wouldn't want her to wait 6-8 weeks. I asked her if she was put on any new medicines and maybe that's why RMR wanted her to follow up to see how the medicine is working for her. Please call patient back at [phone] for reassurance.

## 2013-10-13 NOTE — Telephone Encounter (Signed)
Spoke with [name]- assured her that it was just a follow up visit. [name] stated she was having some problems, still having the pain she was having before and some diarrhea. She is requesting an earlier appt. Darl Pikes, please see if you have an earlier appt for her.

## 2013-10-19 ENCOUNTER — Encounter (HOSPITAL_COMMUNITY): Payer: Self-pay | Admitting: Oncology

## 2013-10-19 ENCOUNTER — Encounter (HOSPITAL_COMMUNITY): Payer: Medicare PPO | Attending: Oncology | Admitting: Oncology

## 2013-10-19 VITALS — BP 115/67 | HR 92 | Temp 98.5°F | Resp 18 | Wt 173.0 lb

## 2013-10-19 DIAGNOSIS — Z09 Encounter for follow-up examination after completed treatment for conditions other than malignant neoplasm: Secondary | ICD-10-CM | POA: Insufficient documentation

## 2013-10-19 DIAGNOSIS — Z859 Personal history of malignant neoplasm, unspecified: Secondary | ICD-10-CM | POA: Insufficient documentation

## 2013-10-19 DIAGNOSIS — D3A020 Benign carcinoid tumor of the appendix: Secondary | ICD-10-CM

## 2013-10-19 DIAGNOSIS — C181 Malignant neoplasm of appendix: Secondary | ICD-10-CM

## 2013-10-19 DIAGNOSIS — Z0000 Encounter for general adult medical examination without abnormal findings: Secondary | ICD-10-CM

## 2013-10-19 DIAGNOSIS — R911 Solitary pulmonary nodule: Secondary | ICD-10-CM

## 2013-10-19 DIAGNOSIS — Z23 Encounter for immunization: Secondary | ICD-10-CM

## 2013-10-19 LAB — CBC WITH DIFFERENTIAL/PLATELET
Eosinophils Absolute: 0.3 10*3/uL (ref 0.0–0.7)
HCT: 36.7 % (ref 36.0–46.0)
Hemoglobin: 12.9 g/dL (ref 12.0–15.0)
Lymphocytes Relative: 31 % (ref 12–46)
Lymphs Abs: 2.7 10*3/uL (ref 0.7–4.0)
MCH: 30.9 pg (ref 26.0–34.0)
Monocytes Absolute: 0.6 10*3/uL (ref 0.1–1.0)
Monocytes Relative: 6 % (ref 3–12)
Neutro Abs: 5.1 10*3/uL (ref 1.7–7.7)
Neutrophils Relative %: 59 % (ref 43–77)
Platelets: 347 10*3/uL (ref 150–400)
RBC: 4.18 MIL/uL (ref 3.87–5.11)
WBC: 8.7 10*3/uL (ref 4.0–10.5)

## 2013-10-19 LAB — COMPREHENSIVE METABOLIC PANEL
Alkaline Phosphatase: 65 U/L (ref 39–117)
BUN: 9 mg/dL (ref 6–23)
CO2: 28 mEq/L (ref 19–32)
Chloride: 99 mEq/L (ref 96–112)
GFR calc Af Amer: >90 mL/min (ref >90)
GFR calc non Af Amer: 82 mL/min — ABNORMAL LOW (ref >90)
Glucose, Bld: 120 mg/dL — ABNORMAL HIGH (ref 70–99)
Potassium: 3.5 mEq/L (ref 3.5–5.1)
Sodium: 137 mEq/L (ref 135–145)
Total Bilirubin: <0.1 mg/dL — ABNORMAL LOW (ref 0.3–1.2)
Total Protein: 6.9 g/dL (ref 6.0–8.3)

## 2013-10-19 MED ORDER — INFLUENZA VAC SPLIT QUAD 0.5 ML IM SUSP
0.5000 mL | Freq: Once | INTRAMUSCULAR | Status: AC
  Administered 2013-10-19: 0.5 mL via INTRAMUSCULAR
  Filled 2013-10-19: qty 0.5

## 2013-10-19 NOTE — Progress Notes (Signed)
[name] Maudlin, MD [address] [place] [address]  Carcinoid, of appendix - Plan: CBC with Differential, Comprehensive metabolic panel, Serotonin serum, Chromogranin A, 5 HIAA, quantitative, urine, 24 hour, CT Abdomen Pelvis W Contrast, CT Chest W Contrast  Lung nodule - Plan: CT Chest W Contrast  Preventative health care - Plan: influenza vac split quadrivalent PF (FLUARIX) injection 0.5 mL  CURRENT THERAPY: Observation with serum serotonin and 24 hour urine collection for 5 HIAA every 6 months   INTERVAL HISTORY: [name] 52 y.o. 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 Reoperation for her lymph node dissection revealed all nodes to be absolutely negative on 01/30/2012.  We reviewed the NCCN guidelines of surveillance for her malignancy. NCCN guidelines recommends the following surveillance 3-12 months postresection:   A. H+P  B. Consider 24 hour urine collection for 5-HIAA  C. Consider Chromogranin A  D. Consider abdominal/pelvic multiphasic CT or MRI. NCCN guidelines recommends the following surveillance greater than 1 year postresection for up to 10 years:  A. H+P every 6-12 months  B. Consider 24 hour urine collection for 5-HIAA every 6-12 months  C. Consider chromogranin A every 6-12 months.  D. Consider multiphasic CT or MRI every 6-12 months.   I personally reviewed and went over laboratory results with the patient.  Labs from June 2014 are unimpressive.  They follow below.   She reports spontaneous sweating and flushing that has increased since her recent colonoscopy.  She reports that it occurs she she finds herself sweating.  She is on hormonal replacement but we will check her serum serotonin today to make sure that is stable and not contributing to this symptom.  She recently  underwent colonoscopy and her pathology from polypectomy was negative for malignancy.  She will be scheduled for repeat colonoscopy in 3-5 years per GI.    She is accompanied by her sister from [place], [place].  I provided the patient education regarding Carcinoid tumor and their ability to secrete particular hormones such as serotonin thereby causing a serotonin syndrome.  Oncologically, she denies any complaints otherwise and ROS questioning is negative.   Past Medical History  Diagnosis Date  . Arthritis   . COPD (chronic obstructive pulmonary disease)   . Bronchitis   . Fibromyalgia   . Interstitial cystitis   . Migraine   . Depression with anxiety   . UTI (lower urinary tract infection)   . RLS (restless legs syndrome)   . Alopecia, unspecified   . Cervicalgia   . Tobacco use disorder   . PTSD (post-traumatic stress disorder)   . IBS (irritable bowel syndrome)   . GERD (gastroesophageal reflux disease)   . Dysphagia   . PONV (postoperative nausea and vomiting)   . Shortness of breath   . Anxiety   . Carcinoid tumor of appendix   . Cancer     carcinoid tumor of appendix  . 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 Reoperation for her lymph node dissection revealed all nodes to be absolutely negative.   . Kidney stones   . Renal failure     has ANXIETY; DRUG DEPENDENCE; DEPRESSION; ALLERGIC RHINITIS; ASTHMA; COPD; GERD;  LOW BACK PAIN; Abdominal pain; Chronic diarrhea; IBS (irritable bowel syndrome); Weight loss; Dysphagia; Nausea & vomiting; Acute bronchitis; Carcinoid, of appendix; and Tubulovillous adenoma on her problem list.     is allergic to ativan; clarithromycin; ibuprofen; pseudoephedrine; reglan; stadol; sulfa antibiotics; toradol; factive; other; prochlorperazine edisylate; promethazine hcl; tofranil-pm; and trimethobenzamide  hcl.  [name] had no medications administered during this visit.  Past Surgical History  Procedure Laterality Date  . Abdominal hysterectomy    . Cholecystectomy    . Tonsillectomy    . Cystostomy w/ bladder dilation      x 52 [place]  . Esophageal dilation  01/01/2012    RMR: Normal esophagus - status post dilation with 54 F Maloney dilation, gastric polyps benign path  . Laparoscopic appendectomy  01/05/2012    Procedure: APPENDECTOMY LAPAROSCOPIC;  Surgeon: Dalia Heading;  Location: [facility];  Service: General;  Laterality: N/A;  . Appendectomy  01/05/2012    with incidental finding of carcinoid  . Givens capsule study  01/27/2012    normal  . Colon resection  01/30/2012    Procedure: HAND ASSISTED LAPAROSCOPIC COLON RESECTION;  Surgeon: Dalia Heading, MD;  Location: [facility];  Service: General;  Laterality: N/A;  . Partial colectomy  01/30/2012    Procedure: PARTIAL COLECTOMY;  Surgeon: Dalia Heading, MD;  Location: [facility];  Service: General;  Laterality: N/A;  . Hemicolectomy    . Ileo-colonoscopy  01/01/12    RMR: multiple colonic polyps, largest polyp in proximal sigmoid colon. Tubulovillous adenoma with high grade dysplasia, tubular adenoma  . Abdominal surgery    . Colon surgery    . Cardiac surgery    . Cardiac catheterization      no stents needed  . Colonoscopy with propofol N/A 09/29/2013    Procedure: COLONOSCOPY WITH PROPOFOL;  Surgeon: [name] Ade, MD;  Location: [facility];  Service: Endoscopy;  Laterality: N/A;  at anastomosis at [phone]  . Esophagogastroduodenoscopy (egd) with propofol N/A 09/29/2013    Procedure: ESOPHAGOGASTRODUODENOSCOPY (EGD) WITH PROPOFOL;  Surgeon: [name] Ade, MD;  Location: [facility];  Service: Endoscopy;  Laterality: N/A;  Elease Hashimoto dilation N/A 09/29/2013    Procedure: Elease Hashimoto DILATION;  Surgeon: [name] Ade, MD;  Location: [facility];  Service: Endoscopy;  Laterality: N/A;  54  . Polypectomy N/A 09/29/2013    Procedure: POLYPECTOMY;  Surgeon: [name] Ade, MD;  Location: [facility];  Service: Endoscopy;  Laterality: N/A;    Denies any headaches, dizziness, double vision, nausea, vomiting, diarrhea, constipation, chest pain, heart palpitations, shortness of breath, blood in stool, black tarry stool, urinary pain, urinary burning, urinary frequency, hematuria.   PHYSICAL EXAMINATION  ECOG PERFORMANCE STATUS: 1 - Symptomatic but completely ambulatory  Filed Vitals:   10/19/13 1414  BP: 115/67  Pulse: 92  Temp: 98.5 F (36.9 C)  Resp: 18    GENERAL:alert, no distress, well nourished, well developed, comfortable, cooperative and smiling SKIN: skin color, texture, turgor are normal, no rashes or significant lesions HEAD: Normocephalic, No masses, lesions, tenderness or abnormalities EYES: normal, PERRLA, EOMI, Conjunctiva are pink and non-injected EARS: External ears normal OROPHARYNX:mucous membranes are moist  NECK: supple, no adenopathy, thyroid normal size, non-tender, without nodularity, no stridor, non-tender, trachea midline LYMPH:  no palpable lymphadenopathy, no hepatosplenomegaly BREAST:not examined LUNGS: clear to auscultation and percussion HEART: regular rate & rhythm, no murmurs, no gallops, S1 normal and S2 normal ABDOMEN:abdomen soft, non-tender, obese, normal bowel sounds, no masses or organomegaly  and no hepatosplenomegaly BACK: Back symmetric, no curvature., No CVA tenderness EXTREMITIES:less then 2 second capillary refill, no joint deformities, effusion, or inflammation, no edema, no skin discoloration, no clubbing, no cyanosis  NEURO: alert & oriented x 3 with fluent speech, no focal motor/sensory deficits, gait normal   LABORATORY DATA: CBC    Component Value Date/Time   WBC 7.2 06/14/2013 0822   RBC 3.97 06/14/2013 0822   HGB 12.2 06/14/2013 0822   HCT 35.6* 06/14/2013 0822   HCT 40 11/12/2011 0928   PLT 300 06/14/2013 0822   MCV 89.7 06/14/2013 0822   MCH 30.7 06/14/2013 0822   MCHC 34.3 06/14/2013 0822    RDW 13.1 06/14/2013 0822   LYMPHSABS 2.8 06/14/2013 0822   MONOABS 0.6 06/14/2013 0822   EOSABS 0.4 06/14/2013 0822   BASOSABS 0.0 06/14/2013 0822      Chemistry      Component Value Date/Time   NA 136 06/14/2013 0822   NA 139 11/12/2011 0928   K 3.6 06/14/2013 0822   K 4.6 11/12/2011 0928   CL 99 06/14/2013 0822   CL 105 11/12/2011 0928   CO2 31 06/14/2013 0822   CO2 26 11/12/2011 0928   BUN 11 06/14/2013 0822   BUN 10 11/12/2011 0928   CREATININE 0.85 06/14/2013 0822   CREATININE 0.81 11/12/2011 0928   GLU 84 11/12/2011 0928      Component Value Date/Time   CALCIUM 8.5 06/14/2013 0822   CALCIUM 9.4 11/12/2011 0928   ALKPHOS 71 06/14/2013 0822   ALKPHOS 56 12/04/2011 0926   AST 16 06/14/2013 0822   AST 14 12/04/2011 0926   ALT 16 06/14/2013 0822   BILITOT 0.2* 06/14/2013 0822   BILITOT 0.4 12/04/2011 0926     Results for Walker, Yarely L (MRN [id number]) as of 10/19/2013 14:37  Ref. Range 04/05/2013 15:23  Serotonin, Serum Latest Range: 56-244 ng/mL <10 (L)      RADIOGRAPHIC STUDIES:  01/10/2013  *RADIOLOGY REPORT*  Clinical Data: Cough, fever, body aches, congestion, weakness,  epigastric pain, nausea, and diarrhea.  CT ABDOMEN AND PELVIS WITH CONTRAST  Technique: Multidetector CT imaging of the abdomen and pelvis was  performed following the standard protocol during bolus  administration of intravenous contrast.  Contrast: OMNIPAQUE IOHEXOL 300 MG/ML SOLN  Comparison: 12/07/2012  Findings: The lung bases are clear. Mild thickening of the distal  esophageal wall may represent reflux esophagitis.  Surgical absence of the gallbladder. The liver, spleen, pancreas,  adrenal glands, kidneys, abdominal aorta, and retroperitoneal lymph  nodes are unremarkable. The stomach, small bowel, and colon are  not abnormally distended. Diffusely stool filled colon.  Postoperative changes with apparent right hemicolectomy and  transverse ileocolonic anastomoses. No free air or free fluid  in  the abdomen.  Pelvis: Stool filled rectosigmoid colon without diverticulitis.  The bladder wall is not thickened. The uterus is surgically absent  at least partially. No abnormal adnexal masses. No free or  loculated pelvic fluid collections. No significant pelvic  lymphadenopathy. Normal alignment of the lumbar vertebrae. No  destructive bone lesions appreciated. No significant change since  previous study.  IMPRESSION:  No acute process demonstrated in the abdomen or pelvis. Stable  appearance since previous study.  Original Report Authenticated By: Burman Nieves, M.D.     ASSESSMENT:  1. 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 Reoperation for her lymph node dissection  revealed all nodes to be absolutely negative on 01/30/2012. 2. Right upper lobe lesion requiring follow-up which is to be performed by PCP.  3. Poor compliance  4. Long list of nonspecific complaints including: pain all over, "spots" on legs, fatigue, "bone pain", abdominal pain, etc. 5. Increased sweats at random time, suspect hormonally  Patient Active Problem List   Diagnosis Date Noted  . 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 Acute bronchitis 02/03/2012  . Nausea & vomiting 01/03/2012  . Abdominal pain 12/02/2011  . Chronic diarrhea 12/02/2011  . IBS (irritable bowel syndrome) 12/02/2011  . Weight loss 12/02/2011  . Dysphagia 12/02/2011  . ANXIETY 05/12/2007  . DRUG DEPENDENCE 05/12/2007  . DEPRESSION 05/12/2007  . ALLERGIC RHINITIS 05/12/2007  . ASTHMA 05/12/2007  . COPD 05/12/2007  . GERD 05/12/2007  . LOW BACK PAIN 05/12/2007    PLAN:  1. I personally reviewed and went over laboratory results with the patient. 2. I personally reviewed and went over radiographic studies with the patient. 3. Next screening mammogram is due in May 2015. 4. Staging  scans: CT abd/pelvis with contrast for surveillance for Carcinoid tumor in Jan 2015 for annual surveillance. 5. CT chest with contrast in 3 months for right upper lobe lung lesion, requiring follow-up. 6. Labs today: CBC diff, CMET, Serum Serotonin, Chromogranin A, 24 hour urine collection for 5-HIAA 7. Discussion regarding the NCCN guidelines for Carcinoid Surveillance. 8. Influenza vaccine today 9. Return in 12 months for follow-up    THERAPY PLAN:  We will continue to perform surveillance per NCCN guidelines.  NCCN guidelines recommends the following surveillance 3-12 months postresection:   A. H+P  B. Consider 24 hour urine collection for 5-HIAA  C. Consider Chromogranin A  D. Consider abdominal/pelvic multiphasic CT or MRI. NCCN guidelines recommends the following surveillance greater than 1 year postresection for up to 10 years:  A. H+P every 6-12 months  B. Consider 24 hour urine collection for 5-HIAA every 6-12 months  C. Consider chromogranin A every 6-12 months.  D. Consider multiphasic CT or MRI every 6-12 months.   All questions were answered. The patient knows to call the clinic with any problems, questions or concerns. We can certainly see the patient much sooner if necessary.  Patient and plan discussed with Dr. Alla German and he is in agreement with the aforementioned.   [name]

## 2013-10-19 NOTE — Patient Instructions (Signed)
[facility] Discharge Instructions  RECOMMENDATIONS MADE BY THE CONSULTANT AND ANY TEST RESULTS WILL BE SENT TO YOUR REFERRING PHYSICIAN.  EXAM FINDINGS BY THE PHYSICIAN TODAY AND SIGNS OR SYMPTOMS TO REPORT TO CLINIC OR PRIMARY PHYSICIAN: Exam and findings as discussed by Dellis Anes, PA - C.  Will draw blood work today, get a 24 hour urine collection and will do scans in January.  If there are any problems we will call you.  MEDICATIONS PRESCRIBED:  none  INSTRUCTIONS/FOLLOW-UP: Blood work today  Scans in January Follow-up in 1 year.  Thank you for choosing [facility] to provide your oncology and hematology care.  To afford each patient quality time with our providers, please arrive at least 15 minutes before your scheduled appointment time.  With your help, our goal is to use those 15 minutes to complete the necessary work-up to ensure our physicians have the information they need to help with your evaluation and healthcare recommendations.    Effective January 1st, 2014, we ask that you re-schedule your appointment with our physicians should you arrive 10 or more minutes late for your appointment.  We strive to give you quality time with our providers, and arriving late affects you and other patients whose appointments are after yours.    Again, thank you for choosing [facility].  Our hope is that these requests will decrease the amount of time that you wait before being seen by our physicians.       _____________________________________________________________  Should you have questions after your visit to [facility], please contact our office at [phone] between the hours of 8:30 a.m. and 5:00 p.m.  Voicemails left after 4:30 p.m. will not be returned until the following business day.  For prescription refill requests, have your pharmacy contact our office with your prescription refill request.

## 2013-10-23 LAB — SEROTONIN SERUM: Serotonin, Serum: 18 ng/mL — ABNORMAL LOW (ref 56–244)

## 2013-11-21 ENCOUNTER — Other Ambulatory Visit (HOSPITAL_COMMUNITY): Payer: Self-pay | Admitting: *Deleted

## 2013-11-21 ENCOUNTER — Emergency Department (HOSPITAL_COMMUNITY): Payer: Medicare PPO

## 2013-11-21 ENCOUNTER — Emergency Department (HOSPITAL_COMMUNITY)
Admission: EM | Admit: 2013-11-21 | Discharge: 2013-11-21 | Disposition: A | Discharge status: Home / Self Care | Payer: Medicare PPO | Attending: Emergency Medicine | Admitting: Emergency Medicine

## 2013-11-21 DIAGNOSIS — M545 Low back pain, unspecified: Secondary | ICD-10-CM | POA: Insufficient documentation

## 2013-11-21 DIAGNOSIS — F341 Dysthymic disorder: Secondary | ICD-10-CM | POA: Insufficient documentation

## 2013-11-21 DIAGNOSIS — Z8679 Personal history of other diseases of the circulatory system: Secondary | ICD-10-CM | POA: Insufficient documentation

## 2013-11-21 DIAGNOSIS — F4310 Post-traumatic stress disorder, unspecified: Secondary | ICD-10-CM | POA: Insufficient documentation

## 2013-11-21 DIAGNOSIS — Z8669 Personal history of other diseases of the nervous system and sense organs: Secondary | ICD-10-CM | POA: Insufficient documentation

## 2013-11-21 DIAGNOSIS — Z8742 Personal history of other diseases of the female genital tract: Secondary | ICD-10-CM | POA: Insufficient documentation

## 2013-11-21 DIAGNOSIS — M79609 Pain in unspecified limb: Secondary | ICD-10-CM | POA: Insufficient documentation

## 2013-11-21 DIAGNOSIS — Z95818 Presence of other cardiac implants and grafts: Secondary | ICD-10-CM | POA: Insufficient documentation

## 2013-11-21 DIAGNOSIS — J441 Chronic obstructive pulmonary disease with (acute) exacerbation: Secondary | ICD-10-CM | POA: Insufficient documentation

## 2013-11-21 DIAGNOSIS — Z87440 Personal history of urinary (tract) infections: Secondary | ICD-10-CM | POA: Insufficient documentation

## 2013-11-21 DIAGNOSIS — Z79899 Other long term (current) drug therapy: Secondary | ICD-10-CM | POA: Insufficient documentation

## 2013-11-21 DIAGNOSIS — F17200 Nicotine dependence, unspecified, uncomplicated: Secondary | ICD-10-CM | POA: Insufficient documentation

## 2013-11-21 DIAGNOSIS — R0789 Other chest pain: Secondary | ICD-10-CM | POA: Insufficient documentation

## 2013-11-21 DIAGNOSIS — R002 Palpitations: Secondary | ICD-10-CM | POA: Insufficient documentation

## 2013-11-21 DIAGNOSIS — K219 Gastro-esophageal reflux disease without esophagitis: Secondary | ICD-10-CM | POA: Insufficient documentation

## 2013-11-21 DIAGNOSIS — Z87442 Personal history of urinary calculi: Secondary | ICD-10-CM | POA: Insufficient documentation

## 2013-11-21 DIAGNOSIS — Z872 Personal history of diseases of the skin and subcutaneous tissue: Secondary | ICD-10-CM | POA: Insufficient documentation

## 2013-11-21 DIAGNOSIS — IMO0002 Reserved for concepts with insufficient information to code with codable children: Secondary | ICD-10-CM | POA: Insufficient documentation

## 2013-11-21 DIAGNOSIS — D3A020 Benign carcinoid tumor of the appendix: Secondary | ICD-10-CM

## 2013-11-21 DIAGNOSIS — Z792 Long term (current) use of antibiotics: Secondary | ICD-10-CM | POA: Insufficient documentation

## 2013-11-21 DIAGNOSIS — M129 Arthropathy, unspecified: Secondary | ICD-10-CM | POA: Insufficient documentation

## 2013-11-21 DIAGNOSIS — R3989 Other symptoms and signs involving the genitourinary system: Secondary | ICD-10-CM | POA: Insufficient documentation

## 2013-11-21 DIAGNOSIS — Z87448 Personal history of other diseases of urinary system: Secondary | ICD-10-CM | POA: Insufficient documentation

## 2013-11-21 DIAGNOSIS — R079 Chest pain, unspecified: Secondary | ICD-10-CM

## 2013-11-21 LAB — CBC WITH DIFFERENTIAL/PLATELET
Eosinophils Absolute: 0.2 10*3/uL (ref 0.0–0.7)
Eosinophils Relative: 3 % (ref 0–5)
HCT: 32.2 % — ABNORMAL LOW (ref 36.0–46.0)
Hemoglobin: 11.4 g/dL — ABNORMAL LOW (ref 12.0–15.0)
Lymphs Abs: 2.1 10*3/uL (ref 0.7–4.0)
MCH: 31.3 pg (ref 26.0–34.0)
MCV: 88.5 fL (ref 78.0–100.0)
Platelets: 297 10*3/uL (ref 150–400)
RBC: 3.64 MIL/uL — ABNORMAL LOW (ref 3.87–5.11)
RDW: 12.7 % (ref 11.5–15.5)
WBC: 7.3 10*3/uL (ref 4.0–10.5)

## 2013-11-21 LAB — URINALYSIS, ROUTINE W REFLEX MICROSCOPIC
Bilirubin Urine: NEGATIVE
Hgb urine dipstick: NEGATIVE
Ketones, ur: NEGATIVE mg/dL
Protein, ur: NEGATIVE mg/dL
Urobilinogen, UA: 0.2 mg/dL (ref 0.0–1.0)
pH: 6.5 (ref 5.0–8.0)

## 2013-11-21 LAB — COMPREHENSIVE METABOLIC PANEL
ALT: 14 U/L (ref 0–35)
AST: 17 U/L (ref 0–37)
Albumin: 3.2 g/dL — ABNORMAL LOW (ref 3.5–5.2)
Alkaline Phosphatase: 53 U/L (ref 39–117)
BUN: 7 mg/dL (ref 6–23)
Calcium: 8.4 mg/dL (ref 8.4–10.5)
GFR calc Af Amer: 88 mL/min — ABNORMAL LOW (ref >90)
Glucose, Bld: 106 mg/dL — ABNORMAL HIGH (ref 70–99)
Sodium: 137 mEq/L (ref 135–145)
Total Protein: 6 g/dL (ref 6.0–8.3)

## 2013-11-21 LAB — TROPONIN I: Troponin I: <0.3 ng/mL (ref <0.30)

## 2013-11-21 MED ORDER — TRAMADOL HCL 50 MG PO TABS
50.0000 mg | ORAL_TABLET | Freq: Once | ORAL | Status: AC
  Administered 2013-11-21: 50 mg via ORAL
  Filled 2013-11-21: qty 1

## 2013-11-21 MED ORDER — TRAMADOL HCL 50 MG PO TABS: 50.0000 mg | ORAL_TABLET | Freq: Four times a day (QID) | ORAL | Status: DC | PRN

## 2013-11-21 NOTE — ED Notes (Signed)
Chest "tightness" for 3-4 days, decreased urine output, with swelling of  Legs. , hands.  Feels "itchy",  Pain rt mid back.

## 2013-11-21 NOTE — ED Notes (Signed)
Pt alert & oriented x4, stable gait. Patient given discharge instructions, paperwork & prescription(s). Patient  instructed to stop at the registration desk to finish any additional paperwork. Patient verbalized understanding. Pt left department w/ no further questions. 

## 2013-11-21 NOTE — ED Provider Notes (Signed)
CSN: [id number]     Arrival date & time 11/21/13  1636 History   First MD Initiated Contact with Patient 11/21/13 1702    Scribed for Geoffery Lyons, MD, the patient was seen in room APA12/APA12. This chart was scribed by Lewanda Rife, ED scribe. Patient's care was started at 5:24 PM  Chief Complaint  Patient presents with  . Chest Pain   (Consider location/radiation/quality/duration/timing/severity/associated sxs/prior Treatment) The history is provided by the patient. No language interpreter was used.   HPI Comments: [name] is a 52 y.o. female who presents to the Emergency Department with PMHx of renal failure and carcinoid tumor of appendix complaining of chest tightness onset 4-5 days. Reports associated palpations, heart racing, shortness of breath, chills, decreased urine, productive cough with green sputum, anxiety, and low back pain. Reports associated intermittent bilateral arm pain. Reports symptoms are exacerbated with exertion and alleviated by nothing. Denies associated fever, nausea, emesis, and diarrhea. Reports she recently prescribed lasix.  Past Medical History  Diagnosis Date  . Arthritis   . COPD (chronic obstructive pulmonary disease)   . Bronchitis   . Fibromyalgia   . Interstitial cystitis   . Migraine   . Depression with anxiety   . UTI (lower urinary tract infection)   . RLS (restless legs syndrome)   . Alopecia, unspecified   . Cervicalgia   . Tobacco use disorder   . PTSD (post-traumatic stress disorder)   . IBS (irritable bowel syndrome)   . GERD (gastroesophageal reflux disease)   . Dysphagia   . PONV (postoperative nausea and vomiting)   . Shortness of breath   . Anxiety   . Carcinoid tumor of appendix   . 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 Reoperation for her lymph node dissection revealed all nodes  to be absolutely negative.   . Kidney stones   . Renal failure   . Cancer     carcinoid tumor of appendix   Past Surgical History  Procedure Laterality Date  . Abdominal hysterectomy    . Cholecystectomy    . Tonsillectomy    . Cystostomy w/ bladder dilation      x 52 [place]  . Esophageal dilation  01/01/2012    RMR: Normal esophagus - status post dilation with 54 F Maloney dilation, gastric polyps benign path  . Laparoscopic appendectomy  01/05/2012    Procedure: APPENDECTOMY LAPAROSCOPIC;  Surgeon: Dalia Heading;  Location: [facility];  Service: General;  Laterality: N/A;  . Appendectomy  01/05/2012    with incidental finding of carcinoid  . Givens capsule study  01/27/2012    normal  . Colon resection  01/30/2012    Procedure: HAND ASSISTED LAPAROSCOPIC COLON RESECTION;  Surgeon: Dalia Heading, MD;  Location: [facility];  Service: General;  Laterality: N/A;  . Partial colectomy  01/30/2012    Procedure: PARTIAL COLECTOMY;  Surgeon: Dalia Heading, MD;  Location: [facility];  Service: General;  Laterality: N/A;  . Hemicolectomy    . Ileo-colonoscopy  01/01/12    RMR: multiple colonic polyps, largest polyp in proximal sigmoid colon. Tubulovillous adenoma with high grade dysplasia, tubular adenoma  . Abdominal surgery    . Colon surgery    . Colonoscopy with propofol N/A 09/29/2013    [id number]:[id number] and colonic polyps-removed as described above Status post right hemicolectomy  . Esophagogastroduodenoscopy (egd)  with propofol N/A 09/29/2013    [id number] appearing Schatzki's ring-status post dilation as described above. Hiatal hernia.  Elease Hashimoto dilation N/A 09/29/2013    Procedure: Elease Hashimoto DILATION;  Surgeon: [name] Ade, MD;  Location: [facility];  Service: Endoscopy;  Laterality: N/A;  55  . Polypectomy N/A 09/29/2013    Procedure: POLYPECTOMY;  Surgeon: [name] Ade, MD;  Location: [facility];  Service: Endoscopy;  Laterality: N/A;  . Cardiac surgery    . Cardiac catheterization      no stents  needed   Family History  Problem Relation Age of Onset  . Coronary artery disease Mother   . Heart attack Mother   . Pulmonary embolism Mother   . Alcohol abuse Father   . COPD Father   . Lung disease Father   . Anesthesia problems Neg Hx   . Hypotension Neg Hx   . Malignant hyperthermia Neg Hx   . Pseudochol deficiency Neg Hx   . Other Neg Hx    History  Substance Use Topics  . Smoking status: Current Some Day Smoker -- 0.25 packs/day for 8 years    Types: Cigarettes    Last Attempt to Quit: 01/25/2012  . Smokeless tobacco: Never Used  . Alcohol Use: No   OB History   Grav Para Term Preterm Abortions TAB SAB Ect Mult Living   1 1 1       1      Review of Systems  Cardiovascular: Positive for chest pain.  All other systems reviewed and are negative.   A complete 10 system review of systems was obtained and all systems are negative except as noted in the HPI and PMHx.    Allergies  Ativan; Clarithromycin; Ibuprofen; Pseudoephedrine; Reglan; Stadol; Sulfa antibiotics; Toradol; Factive; Other; Prochlorperazine edisylate; Promethazine hcl; Tofranil-pm; and Trimethobenzamide hcl  Home Medications   Current Outpatient Rx  Name  Route  Sig  Dispense  Refill  . acetaminophen (TYLENOL) 500 MG tablet   Oral   Take 1,000 mg by mouth every 6 (six) hours as needed for pain.         Marland Kitchen albuterol (PROVENTIL HFA;VENTOLIN HFA) 108 (90 BASE) MCG/ACT inhaler   Inhalation   Inhale 1-2 puffs into the lungs every 6 (six) hours as needed for wheezing.   1 Inhaler   0   . albuterol (PROVENTIL) (2.5 MG/3ML) 0.083% nebulizer solution   Nebulization   Take 2.5 mg by nebulization every 6 (six) hours as needed. Shortness of breath.         . ALPRAZolam (XANAX) 1 MG tablet   Oral   Take 1 mg by mouth at bedtime. For anxiety and sleep         . amphetamine-dextroamphetamine (ADDERALL) 20 MG tablet   Oral   Take 10 mg by mouth 2 (two) times daily. For attention         .  cetirizine (ZYRTEC) 10 MG tablet   Oral   Take 10 mg by mouth every morning.          . cycloSPORINE (RESTASIS) 0.05 % ophthalmic emulsion   Both Eyes   Place 1 drop into both eyes 2 (two) times daily as needed. For dry eyes         . estradiol (VIVELLE-DOT) 0.1 MG/24HR   Transdermal   Place 1 patch onto the skin every 3 (three) days.         Marland Kitchen FLUoxetine (PROZAC) 40 MG capsule   Oral  Take 40 mg by mouth every morning.          . fluticasone (FLONASE) 50 MCG/ACT nasal spray   Nasal   Place 2 sprays into the nose daily.         . furosemide (LASIX) 40 MG tablet   Oral   Take 40 mg by mouth.         . levofloxacin (LEVAQUIN) 500 MG tablet   Oral   Take 500 mg by mouth daily.         Marland Kitchen omeprazole (PRILOSEC) 20 MG capsule   Oral   Take 1 capsule (20 mg total) by mouth daily.   30 capsule   3   . oxyCODONE (OXYCONTIN) 10 MG 12 hr tablet   Oral   Take 10 mg by mouth every 6 (six) hours as needed. Pain.         . potassium chloride SA (K-DUR,KLOR-CON) 20 MEQ tablet   Oral   Take 20 mEq by mouth daily.          . pramipexole (MIRAPEX) 0.5 MG tablet   Oral   Take 0.5 mg by mouth at bedtime.           BP 130/68  Pulse 102  Temp(Src) 98 F (36.7 C) (Oral)  Resp 20  Ht 5' 2.5" (1.588 m)  Wt 170 lb (77.111 kg)  BMI 30.58 kg/m2  SpO2 96%  LMP 12/29/1993 Physical Exam  Nursing note and vitals reviewed. Constitutional: She is oriented to person, place, and time. She appears well-developed and well-nourished. No distress.  HENT:  Head: Normocephalic and atraumatic.  Eyes: EOM are normal.  Neck: Neck supple. No tracheal deviation present.  Cardiovascular: Normal rate and regular rhythm.   Pulmonary/Chest: Effort normal and breath sounds normal. No respiratory distress.  Abdominal: Soft. There is no tenderness.  Musculoskeletal: Normal range of motion. She exhibits no edema and no tenderness.  Neurological: She is alert and oriented to person,  place, and time.  Skin: Skin is warm and dry.  Psychiatric: She has a normal mood and affect. Her behavior is normal.    ED Course  Procedures (including critical care time)  COORDINATION OF CARE:  Nursing notes reviewed. Vital signs reviewed. Initial pt interview and examination performed.   5:29 PM-Discussed work up plan with pt at bedside, which includes Troponin, UA, CMP, CBC with diff panel, CXR, and EKG. Pt agrees with plan.   Treatment plan initiated:Medications - No data to display   Initial diagnostic testing ordered.    Labs Review Labs Reviewed  URINALYSIS, ROUTINE W REFLEX MICROSCOPIC  CBC WITH DIFFERENTIAL  COMPREHENSIVE METABOLIC PANEL  TROPONIN I   Imaging Review No results found.  EKG Interpretation    Date/Time:  Monday November 21 2013 16:48:12 EST Ventricular Rate:  99 PR Interval:  158 QRS Duration: 84 QT Interval:  358 QTC Calculation: 459 R Axis:   23 Text Interpretation:  Normal sinus rhythm Normal ECG When compared with ECG of 12-Jan-2013 16:09, No significant change was found Confirmed by DELOS  MD, Sabino Denning ([id number]) on 11/21/2013 8:15:44 PM            MDM  No diagnosis found. Patient is a 52 year old female with history of anxiety, depression, COPD. She presents today with complaints of discomfort in her chest. She feels like her heart is racing at times and is concerned that her creatinine is elevated. For studies reveal a normal CBC and CMP. Chest x-ray is essentially unremarkable  I feel as though she is stable for discharge. I strongly doubt a cardiac etiology and she has had no dysrhythmias while she has been in the ED. She is to followup with her primary care doctor return for any problems.  I personally performed the services described in this documentation, which was scribed in my presence. The recorded information has been reviewed and is accurate.      Geoffery Lyons, MD 11/21/13 2026

## 2013-11-22 ENCOUNTER — Telehealth: Payer: Self-pay | Admitting: Gastroenterology

## 2013-11-22 ENCOUNTER — Ambulatory Visit: Payer: Medicare PPO | Admitting: Gastroenterology

## 2013-11-22 ENCOUNTER — Encounter: Payer: Self-pay | Admitting: Gastroenterology

## 2013-11-22 NOTE — Telephone Encounter (Signed)
Mailed letter °

## 2013-11-22 NOTE — Telephone Encounter (Signed)
Pt was a no show

## 2014-01-13 ENCOUNTER — Emergency Department (HOSPITAL_COMMUNITY): Payer: Medicare PPO

## 2014-01-13 ENCOUNTER — Encounter (HOSPITAL_COMMUNITY): Payer: Self-pay | Admitting: Emergency Medicine

## 2014-01-13 ENCOUNTER — Emergency Department (HOSPITAL_COMMUNITY)
Admission: EM | Admit: 2014-01-13 | Discharge: 2014-01-13 | Disposition: A | Discharge status: Home / Self Care | Payer: Medicare PPO | Attending: Emergency Medicine | Admitting: Emergency Medicine

## 2014-01-13 DIAGNOSIS — F4310 Post-traumatic stress disorder, unspecified: Secondary | ICD-10-CM | POA: Insufficient documentation

## 2014-01-13 DIAGNOSIS — Z8739 Personal history of other diseases of the musculoskeletal system and connective tissue: Secondary | ICD-10-CM | POA: Insufficient documentation

## 2014-01-13 DIAGNOSIS — F17200 Nicotine dependence, unspecified, uncomplicated: Secondary | ICD-10-CM | POA: Insufficient documentation

## 2014-01-13 DIAGNOSIS — J449 Chronic obstructive pulmonary disease, unspecified: Secondary | ICD-10-CM | POA: Insufficient documentation

## 2014-01-13 DIAGNOSIS — Z9089 Acquired absence of other organs: Secondary | ICD-10-CM | POA: Insufficient documentation

## 2014-01-13 DIAGNOSIS — J4489 Other specified chronic obstructive pulmonary disease: Secondary | ICD-10-CM | POA: Insufficient documentation

## 2014-01-13 DIAGNOSIS — M549 Dorsalgia, unspecified: Secondary | ICD-10-CM | POA: Insufficient documentation

## 2014-01-13 DIAGNOSIS — M791 Myalgia, unspecified site: Secondary | ICD-10-CM

## 2014-01-13 DIAGNOSIS — Z872 Personal history of diseases of the skin and subcutaneous tissue: Secondary | ICD-10-CM | POA: Insufficient documentation

## 2014-01-13 DIAGNOSIS — Z8669 Personal history of other diseases of the nervous system and sense organs: Secondary | ICD-10-CM | POA: Insufficient documentation

## 2014-01-13 DIAGNOSIS — Z79899 Other long term (current) drug therapy: Secondary | ICD-10-CM | POA: Insufficient documentation

## 2014-01-13 DIAGNOSIS — IMO0002 Reserved for concepts with insufficient information to code with codable children: Secondary | ICD-10-CM | POA: Insufficient documentation

## 2014-01-13 DIAGNOSIS — Z792 Long term (current) use of antibiotics: Secondary | ICD-10-CM | POA: Insufficient documentation

## 2014-01-13 DIAGNOSIS — Z9889 Other specified postprocedural states: Secondary | ICD-10-CM | POA: Insufficient documentation

## 2014-01-13 DIAGNOSIS — Z87440 Personal history of urinary (tract) infections: Secondary | ICD-10-CM | POA: Insufficient documentation

## 2014-01-13 DIAGNOSIS — Z87442 Personal history of urinary calculi: Secondary | ICD-10-CM | POA: Insufficient documentation

## 2014-01-13 DIAGNOSIS — Z87448 Personal history of other diseases of urinary system: Secondary | ICD-10-CM | POA: Insufficient documentation

## 2014-01-13 DIAGNOSIS — Z8742 Personal history of other diseases of the female genital tract: Secondary | ICD-10-CM | POA: Insufficient documentation

## 2014-01-13 DIAGNOSIS — F341 Dysthymic disorder: Secondary | ICD-10-CM | POA: Insufficient documentation

## 2014-01-13 DIAGNOSIS — IMO0001 Reserved for inherently not codable concepts without codable children: Secondary | ICD-10-CM | POA: Insufficient documentation

## 2014-01-13 DIAGNOSIS — K219 Gastro-esophageal reflux disease without esophagitis: Secondary | ICD-10-CM | POA: Insufficient documentation

## 2014-01-13 DIAGNOSIS — Z8679 Personal history of other diseases of the circulatory system: Secondary | ICD-10-CM | POA: Insufficient documentation

## 2014-01-13 DIAGNOSIS — R112 Nausea with vomiting, unspecified: Secondary | ICD-10-CM | POA: Insufficient documentation

## 2014-01-13 LAB — CBC WITH DIFFERENTIAL/PLATELET
Basophils Absolute: 0 10*3/uL (ref 0.0–0.1)
Basophils Relative: 0 % (ref 0–1)
EOS ABS: 0.4 10*3/uL (ref 0.0–0.7)
Eosinophils Relative: 4 % (ref 0–5)
HCT: 36.3 % (ref 36.0–46.0)
HEMOGLOBIN: 12.4 g/dL (ref 12.0–15.0)
LYMPHS ABS: 2.9 10*3/uL (ref 0.7–4.0)
Lymphocytes Relative: 30 % (ref 12–46)
MCH: 30 pg (ref 26.0–34.0)
MCHC: 34.2 g/dL (ref 30.0–36.0)
MCV: 87.9 fL (ref 78.0–100.0)
Monocytes Absolute: 0.8 10*3/uL (ref 0.1–1.0)
Monocytes Relative: 9 % (ref 3–12)
NEUTROS ABS: 5.4 10*3/uL (ref 1.7–7.7)
Neutrophils Relative %: 57 % (ref 43–77)
PLATELETS: 323 10*3/uL (ref 150–400)
RBC: 4.13 MIL/uL (ref 3.87–5.11)
RDW: 12.5 % (ref 11.5–15.5)
WBC: 9.5 10*3/uL (ref 4.0–10.5)

## 2014-01-13 LAB — URINALYSIS, ROUTINE W REFLEX MICROSCOPIC
Bilirubin Urine: NEGATIVE
Glucose, UA: NEGATIVE mg/dL
KETONES UR: NEGATIVE mg/dL
Leukocytes, UA: NEGATIVE
Nitrite: NEGATIVE
PROTEIN: NEGATIVE mg/dL
Specific Gravity, Urine: >1.03 — ABNORMAL HIGH (ref 1.005–1.030)
Urobilinogen, UA: 0.2 mg/dL (ref 0.0–1.0)
pH: 5 (ref 5.0–8.0)

## 2014-01-13 LAB — COMPREHENSIVE METABOLIC PANEL
ALT: 10 U/L (ref 0–35)
AST: 12 U/L (ref 0–37)
Albumin: 3.4 g/dL — ABNORMAL LOW (ref 3.5–5.2)
Alkaline Phosphatase: 77 U/L (ref 39–117)
BILIRUBIN TOTAL: 0.3 mg/dL (ref 0.3–1.2)
BUN: 11 mg/dL (ref 6–23)
CALCIUM: 8.7 mg/dL (ref 8.4–10.5)
CO2: 28 meq/L (ref 19–32)
CREATININE: 0.94 mg/dL (ref 0.50–1.10)
Chloride: 98 mEq/L (ref 96–112)
GFR, EST AFRICAN AMERICAN: 80 mL/min — AB (ref >90)
GFR, EST NON AFRICAN AMERICAN: 69 mL/min — AB (ref >90)
GLUCOSE: 95 mg/dL (ref 70–99)
Potassium: 4 mEq/L (ref 3.7–5.3)
Sodium: 138 mEq/L (ref 137–147)
Total Protein: 6.7 g/dL (ref 6.0–8.3)

## 2014-01-13 LAB — URINE MICROSCOPIC-ADD ON

## 2014-01-13 MED ORDER — SODIUM CHLORIDE 0.9 % IV BOLUS (SEPSIS)
1000.0000 mL | Freq: Once | INTRAVENOUS | Status: AC
  Administered 2014-01-13: 1000 mL via INTRAVENOUS

## 2014-01-13 MED ORDER — ONDANSETRON HCL 4 MG/2ML IJ SOLN
4.0000 mg | Freq: Once | INTRAMUSCULAR | Status: AC
  Administered 2014-01-13: 4 mg via INTRAVENOUS
  Filled 2014-01-13: qty 2

## 2014-01-13 MED ORDER — MORPHINE SULFATE 4 MG/ML IJ SOLN
2.0000 mg | Freq: Once | INTRAMUSCULAR | Status: AC
  Administered 2014-01-13: 2 mg via INTRAVENOUS
  Filled 2014-01-13: qty 1

## 2014-01-13 NOTE — ED Provider Notes (Signed)
CSN: [id number]     Arrival date & time 01/13/14  0612 History   First MD Initiated Contact with Patient 01/13/14 0622     Chief Complaint  Patient presents with  . Weakness  . Emesis  . Back Pain   (Consider location/radiation/quality/duration/timing/severity/associated sxs/prior Treatment) HPI Comments: 53 y/o female with hx of COPD, Fibromyalgia, Interstitial Cystitis, who presents approximately 9 days after undergoing dental surgery - states that 7 days ago she developed the flu - this caused her to have a cough productive of phlegm, fevers and chills and body aches - this has gradually eased off but has been persistent and over the last 2 days she has had increased myalgias - she has had occasional vomiting - She denies diarrhea, rash.  She has had urinary frequency and feels as though she can't empty her bladder - urine has also been darker than usual.  (hx of renal failure - resolved).  She has just finished a 7 day Rx of levaquin for the cough.  Patient is a 53 y.o. female presenting with weakness, vomiting, and back pain. The history is provided by the patient.  Weakness  Emesis Back Pain Associated symptoms: weakness     Past Medical History  Diagnosis Date  . Arthritis   . COPD (chronic obstructive pulmonary disease)   . Bronchitis   . Fibromyalgia   . Interstitial cystitis   . Migraine   . Depression with anxiety   . UTI (lower urinary tract infection)   . RLS (restless legs syndrome)   . Alopecia, unspecified   . Cervicalgia   . Tobacco use disorder   . PTSD (post-traumatic stress disorder)   . IBS (irritable bowel syndrome)   . GERD (gastroesophageal reflux disease)   . Dysphagia   . PONV (postoperative nausea and vomiting)   . Shortness of breath   . Anxiety   . Carcinoid tumor of appendix   . 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 Reoperation for her lymph node dissection revealed all nodes to be absolutely negative.   . Kidney stones   . Renal failure   . Cancer     carcinoid tumor of appendix   Past Surgical History  Procedure Laterality Date  . Abdominal hysterectomy    . Cholecystectomy    . Tonsillectomy    . Cystostomy w/ bladder dilation      x 53 [place]  . Esophageal dilation  01/01/2012    RMR: Normal esophagus - status post dilation with 48 F Maloney dilation, gastric polyps benign path  . Laparoscopic appendectomy  01/05/2012    Procedure: APPENDECTOMY LAPAROSCOPIC;  Surgeon: Jamesetta So;  Location: [facility];  Service: General;  Laterality: N/A;  . Appendectomy  01/05/2012    with incidental finding of carcinoid  . Givens capsule study  01/27/2012    normal  . Colon resection  01/30/2012    Procedure: HAND ASSISTED LAPAROSCOPIC COLON RESECTION;  Surgeon: Jamesetta So, MD;  Location: [facility];  Service: General;  Laterality: N/A;  . Partial colectomy  01/30/2012    Procedure: PARTIAL COLECTOMY;  Surgeon: Jamesetta So, MD;  Location: [facility];  Service: General;  Laterality: N/A;  . Hemicolectomy    . Ileo-colonoscopy  01/01/12    RMR: multiple colonic polyps, largest polyp in proximal sigmoid colon. Tubulovillous adenoma with high grade dysplasia,  tubular adenoma  . Abdominal surgery    . Colon surgery    . Colonoscopy with propofol N/A 09/29/2013    [id number] and colonic polyps-removed as described above Status post right hemicolectomy  . Esophagogastroduodenoscopy (egd) with propofol N/A 09/29/2013    [id number] appearing Schatzki's ring-status post dilation as described above. Hiatal hernia.  Venia Minks dilation N/A 09/29/2013    Procedure: Venia Minks DILATION;  Surgeon: Daneil Dolin, MD;  Location: [facility];  Service: Endoscopy;  Laterality: N/A;  55  . Polypectomy N/A 09/29/2013    Procedure: POLYPECTOMY;  Surgeon: Daneil Dolin, MD;  Location: [facility];  Service: Endoscopy;   Laterality: N/A;  . Cardiac surgery    . Cardiac catheterization      no stents needed   Family History  Problem Relation Age of Onset  . Coronary artery disease Mother   . Heart attack Mother   . Pulmonary embolism Mother   . Alcohol abuse Father   . COPD Father   . Lung disease Father   . Anesthesia problems Neg Hx   . Hypotension Neg Hx   . Malignant hyperthermia Neg Hx   . Pseudochol deficiency Neg Hx   . Other Neg Hx    History  Substance Use Topics  . Smoking status: Current Some Day Smoker -- 0.25 packs/day for 8 years    Types: Cigarettes    Last Attempt to Quit: 01/25/2012  . Smokeless tobacco: Never Used  . Alcohol Use: No   OB History   Grav Para Term Preterm Abortions TAB SAB Ect Mult Living   1 1 1       1      Review of Systems  Gastrointestinal: Positive for vomiting.  Musculoskeletal: Positive for back pain.  Neurological: Positive for weakness.  All other systems reviewed and are negative.    Allergies  Ativan; Clarithromycin; Ibuprofen; Pseudoephedrine; Reglan; Stadol; Sulfa antibiotics; Toradol; Factive; Other; Prochlorperazine edisylate; Promethazine hcl; Tofranil-pm; and Trimethobenzamide hcl  Home Medications   Current Outpatient Rx  Name  Route  Sig  Dispense  Refill  . acetaminophen (TYLENOL) 500 MG tablet   Oral   Take 1,000 mg by mouth every 6 (six) hours as needed for pain.         Marland Kitchen albuterol (PROVENTIL HFA;VENTOLIN HFA) 108 (90 BASE) MCG/ACT inhaler   Inhalation   Inhale 1-2 puffs into the lungs every 6 (six) hours as needed for wheezing.   1 Inhaler   0   . albuterol (PROVENTIL) (2.5 MG/3ML) 0.083% nebulizer solution   Nebulization   Take 2.5 mg by nebulization every 6 (six) hours as needed. Shortness of breath.         . ALPRAZolam (XANAX) 1 MG tablet   Oral   Take 1 mg by mouth daily as needed. For anxiety and sleep         . amphetamine-dextroamphetamine (ADDERALL) 20 MG tablet   Oral   Take 10-20 mg by mouth  daily. For attention         . cetirizine (ZYRTEC) 10 MG tablet   Oral   Take 10 mg by mouth every morning.          . cycloSPORINE (RESTASIS) 0.05 % ophthalmic emulsion   Both Eyes   Place 1 drop into both eyes 2 (two) times daily as needed. For dry eyes         . estradiol (VIVELLE-DOT) 0.1 MG/24HR   Transdermal   Place 2 patches onto the  skin every 3 (three) days.          Marland Kitchen FLUoxetine (PROZAC) 40 MG capsule   Oral   Take 40 mg by mouth every morning.          . fluticasone (FLONASE) 50 MCG/ACT nasal spray   Nasal   Place 2 sprays into the nose daily.         . furosemide (LASIX) 40 MG tablet   Oral   Take 40 mg by mouth daily.          Marland Kitchen levofloxacin (LEVAQUIN) 500 MG tablet   Oral   Take 500 mg by mouth daily.         Marland Kitchen omeprazole (PRILOSEC) 20 MG capsule   Oral   Take 1 capsule (20 mg total) by mouth daily.   30 capsule   3   . Oxycodone HCl 10 MG TABS   Oral   Take 10 mg by mouth every 4 (four) hours as needed (for pain).         . potassium chloride SA (K-DUR,KLOR-CON) 20 MEQ tablet   Oral   Take 20 mEq by mouth daily.          Marland Kitchen rOPINIRole (REQUIP) 2 MG tablet   Oral   Take 2 mg by mouth at bedtime.         . traMADol (ULTRAM) 50 MG tablet   Oral   Take 1 tablet (50 mg total) by mouth every 6 (six) hours as needed.   12 tablet   0    BP 133/101  Pulse 80  Temp(Src) 98.8 F (37.1 C) (Oral)  Resp 20  Ht 5\' 2"  (1.575 m)  Wt 170 lb (77.111 kg)  BMI 31.09 kg/m2  SpO2 98%  LMP 12/29/1993 Physical Exam  Nursing note and vitals reviewed. Constitutional: She appears well-developed and well-nourished. No distress.  HENT:  Head: Normocephalic and atraumatic.  Mouth/Throat: Oropharynx is clear and moist. No oropharyngeal exudate.  Eyes: Conjunctivae and EOM are normal. Pupils are equal, round, and reactive to light. Right eye exhibits no discharge. Left eye exhibits no discharge. No scleral icterus.  Neck: Normal range of  motion. Neck supple. No JVD present. No thyromegaly present.  Cardiovascular: Normal rate, regular rhythm, normal heart sounds and intact distal pulses.  Exam reveals no gallop and no friction rub.   No murmur heard. Pulmonary/Chest: Effort normal and breath sounds normal. No respiratory distress. She has no wheezes. She has no rales.  Abdominal: Soft. Bowel sounds are normal. She exhibits no distension and no mass. There is no tenderness.  Musculoskeletal: Normal range of motion. She exhibits no edema and no tenderness.  Lymphadenopathy:    She has no cervical adenopathy.  Neurological: She is alert. Coordination normal.  Skin: Skin is warm and dry. No rash noted. No erythema.  Psychiatric: She has a normal mood and affect. Her behavior is normal.    ED Course  Procedures (including critical care time) Labs Review Labs Reviewed  URINALYSIS, ROUTINE W REFLEX MICROSCOPIC - Abnormal; Notable for the following:    Specific Gravity, Urine >1.030 (*)    Hgb urine dipstick TRACE (*)    All other components within normal limits  COMPREHENSIVE METABOLIC PANEL - Abnormal; Notable for the following:    Albumin 3.4 (*)    GFR calc non Af Amer 69 (*)    GFR calc Af Amer 80 (*)    All other components within normal limits  CBC WITH DIFFERENTIAL  URINE MICROSCOPIC-ADD ON   Imaging Review Dg Chest 2 View  01/13/2014   CLINICAL DATA:  53 year old female shortness breath cough pain anterior lower rib area. Initial encounter.  EXAM: CHEST  2 VIEW  COMPARISON:  11/21/2013 and earlier.  FINDINGS: Mildly improved lung volumes. Normal cardiac size and mediastinal contours. Visualized tracheal air column is within normal limits. No pneumothorax, pulmonary edema, pleural effusion or confluent pulmonary opacity. Stable right upper quadrant surgical clips. No acute osseous abnormality identified.  IMPRESSION: Mildly improved lung volumes.  No acute cardiopulmonary abnormality.   Electronically Signed   By: Lars Pinks M.D.   On: 01/13/2014 07:22    EKG Interpretation   None       MDM   1. Myalgia    The patient's clinical presentation appears benign, she has vital signs which are completely unremarkable, no fever, no tachycardia, no hypoxia and normal lung sounds, normal abdominal exam, no lymphadenopathy and normal pharynx. The patient feels dehydrated and does have a dark kilograms ethyl, could be consistent with dehydration, still nauseated, will give medications, check urinalysis for infection, check renal function.  Tests all normal - pt informed, stable for d/c.  Meds given in ED:  Medications  sodium chloride 0.9 % bolus 1,000 mL (1,000 mLs Intravenous New Bag/Given 01/13/14 0645)  ondansetron (ZOFRAN) injection 4 mg (4 mg Intravenous Given 01/13/14 0654)  morphine 4 MG/ML injection 2 mg (2 mg Intravenous Given 01/13/14 0655)    New Prescriptions   No medications on file      Johnna Acosta, MD 01/13/14 [phone]

## 2014-01-13 NOTE — ED Notes (Signed)
Pt states she has not felt well for a week, states she is having weakness, mild sob, nausea, vomiting.

## 2014-01-13 NOTE — Discharge Instructions (Signed)
Your tests are all normal - you do not have a urinary infection - you may be slightly dehydrated - follow up with Dr. Luan Pulling this week for recheck if your symptoms persist.    Please call your doctor for a followup appointment within 24-48 hours. When you talk to your doctor please let them know that you were seen in the emergency department and have them acquire all of your records so that they can discuss the findings with you and formulate a treatment plan to fully care for your new and ongoing problems.

## 2014-01-18 ENCOUNTER — Encounter (HOSPITAL_COMMUNITY): Payer: Medicare PPO | Attending: Hematology and Oncology

## 2014-01-18 DIAGNOSIS — C181 Malignant neoplasm of appendix: Secondary | ICD-10-CM

## 2014-01-18 DIAGNOSIS — D3A020 Benign carcinoid tumor of the appendix: Secondary | ICD-10-CM

## 2014-01-18 LAB — CBC WITH DIFFERENTIAL/PLATELET
Basophils Absolute: 0 10*3/uL (ref 0.0–0.1)
Basophils Relative: 1 % (ref 0–1)
Eosinophils Absolute: 0.2 10*3/uL (ref 0.0–0.7)
Eosinophils Relative: 3 % (ref 0–5)
HCT: 36.8 % (ref 36.0–46.0)
Hemoglobin: 13 g/dL (ref 12.0–15.0)
LYMPHS PCT: 31 % (ref 12–46)
Lymphs Abs: 2.6 10*3/uL (ref 0.7–4.0)
MCH: 31 pg (ref 26.0–34.0)
MCHC: 35.3 g/dL (ref 30.0–36.0)
MCV: 87.6 fL (ref 78.0–100.0)
Monocytes Absolute: 0.7 10*3/uL (ref 0.1–1.0)
Monocytes Relative: 8 % (ref 3–12)
NEUTROS PCT: 57 % (ref 43–77)
Neutro Abs: 4.8 10*3/uL (ref 1.7–7.7)
PLATELETS: 368 10*3/uL (ref 150–400)
RBC: 4.2 MIL/uL (ref 3.87–5.11)
RDW: 12.8 % (ref 11.5–15.5)
WBC: 8.4 10*3/uL (ref 4.0–10.5)

## 2014-01-18 LAB — COMPREHENSIVE METABOLIC PANEL
ALK PHOS: 65 U/L (ref 39–117)
ALT: 9 U/L (ref 0–35)
AST: 12 U/L (ref 0–37)
Albumin: 3.6 g/dL (ref 3.5–5.2)
BUN: 9 mg/dL (ref 6–23)
CALCIUM: 8.7 mg/dL (ref 8.4–10.5)
CO2: 28 mEq/L (ref 19–32)
Chloride: 98 mEq/L (ref 96–112)
Creatinine, Ser: 0.81 mg/dL (ref 0.50–1.10)
GFR calc Af Amer: >90 mL/min (ref >90)
GFR, EST NON AFRICAN AMERICAN: 83 mL/min — AB (ref >90)
Glucose, Bld: 110 mg/dL — ABNORMAL HIGH (ref 70–99)
POTASSIUM: 4.1 meq/L (ref 3.7–5.3)
Sodium: 137 mEq/L (ref 137–147)
TOTAL PROTEIN: 6.8 g/dL (ref 6.0–8.3)
Total Bilirubin: 0.3 mg/dL (ref 0.3–1.2)

## 2014-01-18 NOTE — Progress Notes (Signed)
Labs drawn today for cbc/diff,cmp,chromogranin A

## 2014-01-19 ENCOUNTER — Ambulatory Visit (HOSPITAL_COMMUNITY): Payer: Medicare PPO

## 2014-01-19 ENCOUNTER — Encounter (HOSPITAL_COMMUNITY): Payer: Self-pay

## 2014-01-19 ENCOUNTER — Ambulatory Visit (HOSPITAL_COMMUNITY)
Admission: RE | Admit: 2014-01-19 | Discharge: 2014-01-19 | Disposition: A | Discharge status: Home / Self Care | Payer: Medicare PPO | Source: Ambulatory Visit | Attending: Oncology | Admitting: Oncology

## 2014-01-19 ENCOUNTER — Other Ambulatory Visit (HOSPITAL_COMMUNITY): Payer: Medicare PPO

## 2014-01-19 DIAGNOSIS — R911 Solitary pulmonary nodule: Secondary | ICD-10-CM | POA: Insufficient documentation

## 2014-01-19 DIAGNOSIS — C181 Malignant neoplasm of appendix: Secondary | ICD-10-CM | POA: Insufficient documentation

## 2014-01-19 DIAGNOSIS — D3A020 Benign carcinoid tumor of the appendix: Secondary | ICD-10-CM

## 2014-01-19 MED ORDER — IOHEXOL 300 MG/ML  SOLN
100.0000 mL | Freq: Once | INTRAMUSCULAR | Status: AC | PRN
  Administered 2014-01-19: 100 mL via INTRAVENOUS

## 2014-01-23 ENCOUNTER — Ambulatory Visit (HOSPITAL_COMMUNITY): Payer: Medicare PPO

## 2014-01-24 LAB — CHROMOGRANIN A: CHROMOGRANIN A: 8 ng/mL (ref 1.9–15.0)

## 2014-06-20 ENCOUNTER — Other Ambulatory Visit (HOSPITAL_COMMUNITY): Payer: Self-pay | Admitting: Pulmonary Disease

## 2014-06-20 DIAGNOSIS — Z1231 Encounter for screening mammogram for malignant neoplasm of breast: Secondary | ICD-10-CM

## 2014-06-26 ENCOUNTER — Ambulatory Visit (HOSPITAL_COMMUNITY): Payer: Medicare PPO

## 2014-06-29 ENCOUNTER — Ambulatory Visit (INDEPENDENT_AMBULATORY_CARE_PROVIDER_SITE_OTHER): Payer: Medicare PPO | Admitting: Otolaryngology

## 2014-06-29 DIAGNOSIS — J342 Deviated nasal septum: Secondary | ICD-10-CM

## 2014-06-29 DIAGNOSIS — J310 Chronic rhinitis: Secondary | ICD-10-CM

## 2014-06-29 DIAGNOSIS — J343 Hypertrophy of nasal turbinates: Secondary | ICD-10-CM

## 2014-07-03 ENCOUNTER — Ambulatory Visit (HOSPITAL_COMMUNITY): Payer: Medicare PPO

## 2014-07-04 ENCOUNTER — Other Ambulatory Visit (INDEPENDENT_AMBULATORY_CARE_PROVIDER_SITE_OTHER): Payer: Self-pay | Admitting: Otolaryngology

## 2014-07-04 DIAGNOSIS — J324 Chronic pansinusitis: Secondary | ICD-10-CM

## 2014-07-07 ENCOUNTER — Ambulatory Visit (HOSPITAL_COMMUNITY): Payer: Medicare PPO

## 2014-07-12 ENCOUNTER — Ambulatory Visit (HOSPITAL_COMMUNITY): Payer: Medicare PPO

## 2014-07-14 ENCOUNTER — Ambulatory Visit (HOSPITAL_COMMUNITY): Payer: Medicare PPO

## 2014-09-05 ENCOUNTER — Other Ambulatory Visit (HOSPITAL_COMMUNITY): Payer: Medicare PPO

## 2014-09-06 ENCOUNTER — Ambulatory Visit (HOSPITAL_COMMUNITY): Payer: Medicare PPO

## 2014-09-11 ENCOUNTER — Ambulatory Visit (HOSPITAL_COMMUNITY): Payer: Medicare PPO

## 2014-09-11 ENCOUNTER — Ambulatory Visit (HOSPITAL_COMMUNITY): Admission: RE | Admit: 2014-09-11 | Payer: Medicare PPO | Source: Ambulatory Visit

## 2014-09-13 ENCOUNTER — Ambulatory Visit (HOSPITAL_COMMUNITY): Payer: Medicare PPO

## 2014-09-13 ENCOUNTER — Ambulatory Visit (HOSPITAL_COMMUNITY)
Admission: RE | Admit: 2014-09-13 | Discharge: 2014-09-13 | Disposition: A | Discharge status: Home / Self Care | Payer: Medicare PPO | Source: Ambulatory Visit | Attending: Pulmonary Disease | Admitting: Pulmonary Disease

## 2014-09-13 ENCOUNTER — Ambulatory Visit (HOSPITAL_COMMUNITY)
Admission: RE | Admit: 2014-09-13 | Discharge: 2014-09-13 | Disposition: A | Discharge status: Home / Self Care | Payer: Medicare PPO | Source: Ambulatory Visit | Attending: Otolaryngology | Admitting: Otolaryngology

## 2014-09-13 DIAGNOSIS — J324 Chronic pansinusitis: Secondary | ICD-10-CM

## 2014-09-13 DIAGNOSIS — Z1231 Encounter for screening mammogram for malignant neoplasm of breast: Secondary | ICD-10-CM | POA: Diagnosis not present

## 2014-09-14 ENCOUNTER — Ambulatory Visit (HOSPITAL_COMMUNITY): Admission: RE | Admit: 2014-09-14 | Payer: Medicare PPO | Source: Ambulatory Visit

## 2014-09-18 ENCOUNTER — Ambulatory Visit (HOSPITAL_COMMUNITY): Payer: Medicare PPO

## 2014-09-19 ENCOUNTER — Ambulatory Visit (HOSPITAL_COMMUNITY): Payer: Medicare PPO

## 2014-09-20 ENCOUNTER — Other Ambulatory Visit (HOSPITAL_COMMUNITY): Payer: Medicare PPO

## 2014-09-22 ENCOUNTER — Ambulatory Visit (HOSPITAL_COMMUNITY): Admission: RE | Admit: 2014-09-22 | Payer: Medicare PPO | Source: Ambulatory Visit

## 2014-10-19 ENCOUNTER — Ambulatory Visit (HOSPITAL_COMMUNITY): Payer: Medicare PPO

## 2014-10-20 NOTE — Progress Notes (Signed)
This encounter was created in error - please disregard.

## 2014-10-27 ENCOUNTER — Ambulatory Visit (HOSPITAL_COMMUNITY): Payer: Medicare PPO

## 2014-10-30 ENCOUNTER — Encounter (HOSPITAL_COMMUNITY): Payer: Self-pay

## 2014-10-30 NOTE — Progress Notes (Signed)
[name] Bogus, MD [address] [place] [address]  Carcinoid, of appendix - Plan: CBC with Differential, Comprehensive metabolic panel, Chromogranin A, Serotonin serum, CT Abdomen Pelvis W Contrast, CBC with Differential, Comprehensive metabolic panel, Chromogranin A, Serotonin serum, 5 HIAA, quantitative, urine, 24 hour, 5 HIAA, quantitative, urine, 24 hour, 5 HIAA, quantitative, urine, 24 hour  Anal pain - Plan: Ambulatory referral to Gastroenterology  CURRENT THERAPY: Surveillance per NCCN guidelines  INTERVAL HISTORY: [name] 53 y.o. 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 Reoperation for her lymph node dissection revealed all nodes to be absolutely negative on 01/30/2012.   Chart is reviewed and ED visits noted over the past 12 months.  I personally reviewed and went over laboratory results with the patient.  The results are noted within this dictation.  I personally reviewed and went over radiographic studies with the patient.  The results are noted within this dictation.  CTs from Jan 2015 demonstrate no evidence of disease.  Mammogram in Sept 2015 was BIRADS 1.  [name] has a few complaints that are nonspecific that may require primary care intervention, however, she has two main complaints: 1. Low back pain with right sided lateral back pain.  Low back pain is chronic.  Other back pain is sharp in natures and comes and goes.  It has been ongoing x 4 months.  I will defer this to her primary care provider.  She does not appear or act to be actively in pain.  ROM is WNL. 2. "Anal pressure."  She denies any blood in her stool, black tarry stool, change in bowel habits, constipation, diarrhea, change in frequency of stools, change in caliber of stool, and BRBPR.  She denies any pencil-thin stools.   She denies any vaginal pain, pressure, or discomfort.  She denies dyspareunia ("I am not having sex."). "I do not want anyone looking at my backside."  I recommended an anal exam today and she declines.  She is agreeable to see Dr. Gala Walker for further evaluation.  "I am due for a colonoscopy anyway."  Oncologically, she otherwise denies any complaints and ROS questioning is negative.  Past Medical History  Diagnosis Date  . Arthritis   . COPD (chronic obstructive pulmonary disease)   . Bronchitis   . Fibromyalgia   . Interstitial cystitis   . Migraine   . Depression with anxiety   . UTI (lower urinary tract infection)   . RLS (restless legs syndrome)   . Alopecia, unspecified   . Cervicalgia   . Tobacco use disorder   . PTSD (post-traumatic stress disorder)   . IBS (irritable bowel syndrome)   . GERD (gastroesophageal reflux disease)   . Dysphagia   . PONV (postoperative nausea and vomiting)   . Shortness of breath   . Anxiety   . Carcinoid tumor of appendix   . 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 Reoperation for her lymph node dissection revealed all nodes to be absolutely negative.   . Kidney stones   . Renal failure   . Cancer     carcinoid tumor of appendix    has ANXIETY; DRUG DEPENDENCE; DEPRESSION; ALLERGIC RHINITIS; ASTHMA; COPD;  GERD; LOW BACK PAIN; Abdominal pain; Chronic diarrhea; IBS (irritable bowel syndrome); Weight loss; Dysphagia; Nausea & vomiting; Acute bronchitis; Carcinoid, of appendix; and Tubulovillous adenoma on her problem list.     is allergic to ativan; clarithromycin; ibuprofen; pseudoephedrine; reglan; stadol; sulfa antibiotics; toradol; factive; other; prochlorperazine edisylate; promethazine hcl; tofranil-pm; and trimethobenzamide hcl.  [name] had no medications administered during this visit.  Past Surgical  History  Procedure Laterality Date  . Abdominal hysterectomy    . Cholecystectomy    . Tonsillectomy    . Cystostomy w/ bladder dilation      x 52 [place]  . Esophageal dilation  01/01/2012    RMR: Normal esophagus - status post dilation with 81 F Maloney dilation, gastric polyps benign path  . Laparoscopic appendectomy  01/05/2012    Procedure: APPENDECTOMY LAPAROSCOPIC;  Surgeon: Jamesetta So;  Location: [facility];  Service: General;  Laterality: N/A;  . Appendectomy  01/05/2012    with incidental finding of carcinoid  . Givens capsule study  01/27/2012    normal  . Colon resection  01/30/2012    Procedure: HAND ASSISTED LAPAROSCOPIC COLON RESECTION;  Surgeon: Jamesetta So, MD;  Location: [facility];  Service: General;  Laterality: N/A;  . Partial colectomy  01/30/2012    Procedure: PARTIAL COLECTOMY;  Surgeon: Jamesetta So, MD;  Location: [facility];  Service: General;  Laterality: N/A;  . Hemicolectomy    . Ileo-colonoscopy  01/01/12    RMR: multiple colonic polyps, largest polyp in proximal sigmoid colon. Tubulovillous adenoma with high grade dysplasia, tubular adenoma  . Abdominal surgery    . Colon surgery    . Colonoscopy with propofol N/A 09/29/2013    XQJ:JHERDE and colonic polyps-removed as described above Status post right hemicolectomy  . Esophagogastroduodenoscopy (egd) with propofol N/A 09/29/2013    [id number] appearing Schatzki's ring-status post dilation as described above. Hiatal hernia.  [name] dilation N/A 09/29/2013    Procedure: [name] DILATION;  Surgeon: Daneil Dolin, MD;  Location: [facility];  Service: Endoscopy;  Laterality: N/A;  55  . Polypectomy N/A 09/29/2013    Procedure: POLYPECTOMY;  Surgeon: Daneil Dolin, MD;  Location: [facility];  Service: Endoscopy;  Laterality: N/A;  . Cardiac surgery    . Cardiac catheterization      no stents needed    Denies any headaches, dizziness, double vision, fevers, chills, night sweats, nausea, vomiting, diarrhea,  constipation, chest pain, heart palpitations, shortness of breath, blood in stool, black tarry stool, urinary pain, urinary burning, urinary frequency, hematuria.   PHYSICAL EXAMINATION  ECOG PERFORMANCE STATUS: 0 - Asymptomatic  Filed Vitals:   11/01/14 1400  BP: 132/74  Pulse: 91  Temp: 98.4 F (36.9 C)  Resp: 20    GENERAL:alert, no distress, well nourished, well developed, comfortable, cooperative and smiling SKIN: skin color, texture, turgor are normal, no rashes or significant lesions HEAD: Normocephalic, No masses, lesions, tenderness or abnormalities EYES: normal, PERRLA, EOMI, Conjunctiva are pink and non-injected EARS: External ears normal OROPHARYNX:mucous membranes are moist  NECK: supple, no adenopathy, thyroid normal size, non-tender, without nodularity, no stridor, non-tender, trachea midline LYMPH:  no palpable lymphadenopathy, no hepatosplenomegaly BREAST:not examined LUNGS: clear to auscultation and percussion HEART: regular rate & rhythm, no murmurs, no gallops, S1 normal and S2 normal ABDOMEN:abdomen soft, non-tender, normal bowel sounds, no masses or organomegaly and no hepatosplenomegaly BACK: Back symmetric, no curvature., No CVA tenderness, Range of motion is normal, negative straight leg raising, tenderness to palpation  of low back EXTREMITIES:less then 2 second capillary refill, no joint deformities, effusion, or inflammation, no edema, no skin discoloration, no clubbing, no cyanosis  NEURO: alert & oriented x 3 with fluent speech, no focal motor/sensory deficits, gait normal   LABORATORY DATA: CBC    Component Value Date/Time   WBC 8.4 01/18/2014 1140   RBC 4.20 01/18/2014 1140   HGB 13.0 01/18/2014 1140   HCT 36.8 01/18/2014 1140   HCT 40 11/12/2011 0928   PLT 368 01/18/2014 1140   MCV 87.6 01/18/2014 1140   MCH 31.0 01/18/2014 1140   MCHC 35.3 01/18/2014 1140   RDW 12.8 01/18/2014 1140   LYMPHSABS 2.6 01/18/2014 1140   MONOABS 0.7 01/18/2014  1140   EOSABS 0.2 01/18/2014 1140   BASOSABS 0.0 01/18/2014 1140      Chemistry      Component Value Date/Time   NA 137 01/18/2014 1140   NA 139 11/12/2011 0928   K 4.1 01/18/2014 1140   K 4.6 11/12/2011 0928   CL 98 01/18/2014 1140   CL 105 11/12/2011 0928   CO2 28 01/18/2014 1140   CO2 26 11/12/2011 0928   BUN 9 01/18/2014 1140   BUN 10 11/12/2011 0928   CREATININE 0.81 01/18/2014 1140   CREATININE 0.81 11/12/2011 0928   GLU 84 11/12/2011 0928      Component Value Date/Time   CALCIUM 8.7 01/18/2014 1140   CALCIUM 9.4 11/12/2011 0928   ALKPHOS 65 01/18/2014 1140   ALKPHOS 56 12/04/2011 0926   AST 12 01/18/2014 1140   AST 14 12/04/2011 0926   ALT 9 01/18/2014 1140   BILITOT 0.3 01/18/2014 1140   BILITOT 0.4 12/04/2011 0926      Results for Walker, Braelyn L (MRN [id number]) as of 10/30/2014 17:11  Ref. Range 01/18/2014 11:40  Chromogranin A Latest Range: 1.9-15.0 ng/mL 8.0       ASSESSMENT:  1. 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 Reoperation for her lymph node dissection revealed all nodes to be absolutely negative on 01/30/2012. 2. Right upper lobe lesion requiring follow-up which is to be performed by PCP.  3. Poor compliance  4. Long list of nonspecific complaints including: pain all over, "spots" on legs, fatigue, "bone pain", abdominal pain, etc. 5. Chronic low back pain 6. Anal pressure, refusing exam today  Patient Active Problem List   Diagnosis Date Noted  . 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 Acute bronchitis 02/03/2012  . Nausea & vomiting 01/03/2012  . Abdominal pain 12/02/2011  . Chronic diarrhea 12/02/2011  . IBS (irritable bowel syndrome) 12/02/2011  . Weight loss 12/02/2011  . Dysphagia 12/02/2011  . ANXIETY 05/12/2007  . DRUG DEPENDENCE 05/12/2007  . DEPRESSION 05/12/2007  . ALLERGIC  RHINITIS 05/12/2007  . ASTHMA 05/12/2007  . COPD 05/12/2007  . GERD 05/12/2007  . LOW BACK PAIN 05/12/2007     PLAN:  1. I personally reviewed and went over laboratory results with the patient.  The results are noted within this dictation. 2. I personally reviewed and went over radiographic studies with the patient.  The results are noted within this dictation.   3. Staging scans: CT abd/pelvis with contrast for surveillance for Carcinoid tumor in Jan 2016 for annual surveillance. 4. Labs today, 6 months, and 12 months: CBC diff, CMET, Serum Serotonin, Chromogranin A 5. 24 hour urine collection for 5HIAA  in 12 months 6. Refer to Dr. Gala Walker for "anal pressure" refusing examination today. 7. Follow-up with primary care provider for back pain 8. Smoking cessation education provided 9. Return in 12 months for follow-up   THERAPY PLAN:  NCCN guidelines recommends the following surveillance 3-12 months postresection:   A. H+P  B. Consider 24 hour urine collection for 5-HIAA  C. Consider Chromogranin A  D. Consider abdominal/pelvic multiphasic CT or MRI. NCCN guidelines recommends the following surveillance greater than 1 year postresection for up to 10 years:  A. H+P every 6-12 months  B. Consider 24 hour urine collection for 5-HIAA every 6-12 months  C. Consider chromogranin A every 6-12 months.  D. Consider multiphasic CT or MRI every 6-12 months.   All questions were answered. The patient knows to call the clinic with any problems, questions or concerns. We can certainly see the patient much sooner if necessary.  Patient and plan discussed with Dr. Farrel Gobble and he is in agreement with the aforementioned.    KEFALAS,THOMAS 11/01/2014

## 2014-11-01 ENCOUNTER — Encounter (HOSPITAL_COMMUNITY): Payer: Medicare PPO | Attending: Oncology | Admitting: Oncology

## 2014-11-01 ENCOUNTER — Encounter (HOSPITAL_COMMUNITY): Payer: Self-pay | Admitting: Oncology

## 2014-11-01 VITALS — BP 132/74 | HR 91 | Temp 98.4°F | Resp 20 | Wt 153.0 lb

## 2014-11-01 DIAGNOSIS — K6289 Other specified diseases of anus and rectum: Secondary | ICD-10-CM | POA: Diagnosis not present

## 2014-11-01 DIAGNOSIS — D3A020 Benign carcinoid tumor of the appendix: Secondary | ICD-10-CM

## 2014-11-01 DIAGNOSIS — M545 Low back pain: Secondary | ICD-10-CM | POA: Insufficient documentation

## 2014-11-01 DIAGNOSIS — R911 Solitary pulmonary nodule: Secondary | ICD-10-CM | POA: Diagnosis not present

## 2014-11-01 DIAGNOSIS — Z9119 Patient's noncompliance with other medical treatment and regimen: Secondary | ICD-10-CM | POA: Diagnosis not present

## 2014-11-01 DIAGNOSIS — M549 Dorsalgia, unspecified: Secondary | ICD-10-CM

## 2014-11-01 DIAGNOSIS — C181 Malignant neoplasm of appendix: Secondary | ICD-10-CM | POA: Diagnosis not present

## 2014-11-01 DIAGNOSIS — C7A020 Malignant carcinoid tumor of the appendix: Secondary | ICD-10-CM

## 2014-11-01 DIAGNOSIS — G8929 Other chronic pain: Secondary | ICD-10-CM | POA: Insufficient documentation

## 2014-11-01 LAB — CBC WITH DIFFERENTIAL/PLATELET
Basophils Absolute: 0 10*3/uL (ref 0.0–0.1)
Basophils Relative: 0 % (ref 0–1)
Eosinophils Absolute: 0.2 10*3/uL (ref 0.0–0.7)
Eosinophils Relative: 2 % (ref 0–5)
HEMATOCRIT: 39 % (ref 36.0–46.0)
Hemoglobin: 13.6 g/dL (ref 12.0–15.0)
LYMPHS ABS: 1.5 10*3/uL (ref 0.7–4.0)
LYMPHS PCT: 22 % (ref 12–46)
MCH: 30.7 pg (ref 26.0–34.0)
MCHC: 34.9 g/dL (ref 30.0–36.0)
MCV: 88 fL (ref 78.0–100.0)
MONO ABS: 0.4 10*3/uL (ref 0.1–1.0)
Monocytes Relative: 5 % (ref 3–12)
Neutro Abs: 5 10*3/uL (ref 1.7–7.7)
Neutrophils Relative %: 71 % (ref 43–77)
Platelets: 369 10*3/uL (ref 150–400)
RBC: 4.43 MIL/uL (ref 3.87–5.11)
RDW: 12.3 % (ref 11.5–15.5)
WBC: 7 10*3/uL (ref 4.0–10.5)

## 2014-11-01 LAB — COMPREHENSIVE METABOLIC PANEL
ALT: 13 U/L (ref 0–35)
AST: 17 U/L (ref 0–37)
Albumin: 3.8 g/dL (ref 3.5–5.2)
Alkaline Phosphatase: 74 U/L (ref 39–117)
Anion gap: 12 (ref 5–15)
BILIRUBIN TOTAL: 0.2 mg/dL — AB (ref 0.3–1.2)
BUN: 9 mg/dL (ref 6–23)
CHLORIDE: 100 meq/L (ref 96–112)
CO2: 26 meq/L (ref 19–32)
CREATININE: 0.94 mg/dL (ref 0.50–1.10)
Calcium: 9.3 mg/dL (ref 8.4–10.5)
GFR calc Af Amer: 79 mL/min — ABNORMAL LOW (ref >90)
GFR, EST NON AFRICAN AMERICAN: 69 mL/min — AB (ref >90)
Glucose, Bld: 99 mg/dL (ref 70–99)
Potassium: 4.1 mEq/L (ref 3.7–5.3)
Sodium: 138 mEq/L (ref 137–147)
Total Protein: 7.5 g/dL (ref 6.0–8.3)

## 2014-11-01 NOTE — Progress Notes (Signed)
[name]'s reason for visit today is for labs as scheduled per MD orders.  Venipuncture performed with a 23 gauge butterfly needle to R Antecubital.  [name] tolerated procedure well and without incident; questions were answered and patient was discharged.

## 2014-11-01 NOTE — Patient Instructions (Signed)
[facility] Discharge Instructions  RECOMMENDATIONS MADE BY THE CONSULTANT AND ANY TEST RESULTS WILL BE SENT TO YOUR REFERRING PHYSICIAN.  Follow-up with your primary care provider regarding you back pain. From an oncology standpoint, we will monitor for recurrence of your Carcinoid Tumor back in 2013.  We will perform lab work in 6 months and 12 months.  I have ordered a CT of abd/pelvis in Jan 2016.  We will see you back in 12 months, sooner if needed.    Thank you for choosing [facility] to provide your oncology and hematology care.  To afford each patient quality time with our providers, please arrive at least 15 minutes before your scheduled appointment time.  With your help, our goal is to use those 15 minutes to complete the necessary work-up to ensure our physicians have the information they need to help with your evaluation and healthcare recommendations.    Effective January 1st, 2014, we ask that you re-schedule your appointment with our physicians should you arrive 10 or more minutes late for your appointment.  We strive to give you quality time with our providers, and arriving late affects you and other patients whose appointments are after yours.    Again, thank you for choosing [facility].  Our hope is that these requests will decrease the amount of time that you wait before being seen by our physicians.       _____________________________________________________________  Should you have questions after your visit to [facility], please contact our office at [phone] between the hours of 8:30 a.m. and 5:00 p.m.  Voicemails left after 4:30 p.m. will not be returned until the following business day.  For prescription refill requests, have your pharmacy contact our office with your prescription refill request.

## 2014-11-02 ENCOUNTER — Encounter: Payer: Self-pay | Admitting: Internal Medicine

## 2014-11-02 ENCOUNTER — Telehealth (HOSPITAL_COMMUNITY): Payer: Self-pay

## 2014-11-02 NOTE — Telephone Encounter (Signed)
Labs results are wonderful.  [name] 11/02/2014

## 2014-11-02 NOTE — Telephone Encounter (Signed)
Message received from patient wanting to know lab results.  Questions if WBC and kidney function tests were ok and if they aren't could that be what is causing her lower back pain?  Can be reached at [phone].

## 2014-11-02 NOTE — Telephone Encounter (Signed)
Patient notified that lab ok per Robynn Pane, PA-C.

## 2014-11-04 LAB — SEROTONIN SERUM: Serotonin, Serum: 22 ng/mL — ABNORMAL LOW (ref 56–244)

## 2014-11-06 LAB — 5 HIAA, QUANTITATIVE, URINE, 24 HOUR
5-HIAA, 24 Hr Urine: 2.2 mg/24 h (ref <=6.0)
Volume, Urine-5HIAA: 1300 mL/24 h

## 2014-11-07 LAB — CHROMOGRANIN A: Chromogranin A: 40 ng/mL — ABNORMAL HIGH (ref <=15)

## 2014-11-27 ENCOUNTER — Encounter (HOSPITAL_COMMUNITY): Payer: Self-pay | Admitting: Oncology

## 2014-12-01 ENCOUNTER — Ambulatory Visit: Payer: Medicare PPO | Admitting: Gastroenterology

## 2014-12-01 ENCOUNTER — Telehealth: Payer: Self-pay | Admitting: Internal Medicine

## 2014-12-01 ENCOUNTER — Encounter: Payer: Self-pay | Admitting: Gastroenterology

## 2014-12-01 NOTE — Telephone Encounter (Signed)
PATIENT WAS A NO SHOW ON 12/01/14 LETTER SENT

## 2014-12-28 ENCOUNTER — Other Ambulatory Visit (HOSPITAL_COMMUNITY): Payer: Self-pay | Admitting: Pulmonary Disease

## 2014-12-28 DIAGNOSIS — R2689 Other abnormalities of gait and mobility: Secondary | ICD-10-CM

## 2015-01-03 ENCOUNTER — Ambulatory Visit (HOSPITAL_COMMUNITY)
Admission: RE | Admit: 2015-01-03 | Discharge: 2015-01-03 | Disposition: A | Discharge status: Home / Self Care | Payer: Medicare Other | Source: Ambulatory Visit | Attending: Pulmonary Disease | Admitting: Pulmonary Disease

## 2015-01-03 ENCOUNTER — Ambulatory Visit (HOSPITAL_COMMUNITY): Payer: Medicare Other

## 2015-01-03 DIAGNOSIS — R55 Syncope and collapse: Secondary | ICD-10-CM | POA: Diagnosis not present

## 2015-01-03 DIAGNOSIS — J449 Chronic obstructive pulmonary disease, unspecified: Secondary | ICD-10-CM | POA: Diagnosis not present

## 2015-01-03 DIAGNOSIS — J323 Chronic sphenoidal sinusitis: Secondary | ICD-10-CM | POA: Insufficient documentation

## 2015-01-03 DIAGNOSIS — F909 Attention-deficit hyperactivity disorder, unspecified type: Secondary | ICD-10-CM | POA: Diagnosis not present

## 2015-01-03 DIAGNOSIS — J322 Chronic ethmoidal sinusitis: Secondary | ICD-10-CM | POA: Diagnosis not present

## 2015-01-03 DIAGNOSIS — R2689 Other abnormalities of gait and mobility: Secondary | ICD-10-CM | POA: Diagnosis not present

## 2015-01-03 DIAGNOSIS — R42 Dizziness and giddiness: Secondary | ICD-10-CM | POA: Insufficient documentation

## 2015-01-03 DIAGNOSIS — M791 Myalgia: Secondary | ICD-10-CM | POA: Diagnosis not present

## 2015-01-09 ENCOUNTER — Ambulatory Visit (HOSPITAL_COMMUNITY): Payer: Medicare Other

## 2015-01-10 ENCOUNTER — Ambulatory Visit (HOSPITAL_COMMUNITY): Payer: Medicare Other

## 2015-01-11 ENCOUNTER — Ambulatory Visit (INDEPENDENT_AMBULATORY_CARE_PROVIDER_SITE_OTHER): Payer: Medicare Other | Admitting: Otolaryngology

## 2015-01-11 DIAGNOSIS — J310 Chronic rhinitis: Secondary | ICD-10-CM

## 2015-01-11 DIAGNOSIS — J343 Hypertrophy of nasal turbinates: Secondary | ICD-10-CM | POA: Diagnosis not present

## 2015-01-12 ENCOUNTER — Ambulatory Visit: Payer: Medicare PPO | Admitting: Gastroenterology

## 2015-01-17 ENCOUNTER — Ambulatory Visit (HOSPITAL_COMMUNITY): Admission: RE | Admit: 2015-01-17 | Payer: Medicare Other | Source: Ambulatory Visit

## 2015-02-08 ENCOUNTER — Ambulatory Visit: Payer: Self-pay | Admitting: Gastroenterology

## 2015-02-15 ENCOUNTER — Ambulatory Visit (HOSPITAL_COMMUNITY): Admission: RE | Admit: 2015-02-15 | Payer: Medicare Other | Source: Ambulatory Visit

## 2015-02-26 ENCOUNTER — Encounter: Payer: Self-pay | Admitting: Nurse Practitioner

## 2015-02-26 ENCOUNTER — Ambulatory Visit (INDEPENDENT_AMBULATORY_CARE_PROVIDER_SITE_OTHER): Payer: Medicare Other | Admitting: Nurse Practitioner

## 2015-02-26 VITALS — BP 128/75 | HR 77 | Temp 97.1°F | Ht 62.0 in | Wt 164.8 lb

## 2015-02-26 DIAGNOSIS — C181 Malignant neoplasm of appendix: Secondary | ICD-10-CM

## 2015-02-26 DIAGNOSIS — R1310 Dysphagia, unspecified: Secondary | ICD-10-CM | POA: Diagnosis not present

## 2015-02-26 DIAGNOSIS — D3A020 Benign carcinoid tumor of the appendix: Secondary | ICD-10-CM

## 2015-02-26 NOTE — Patient Instructions (Signed)
1. We will discuss needed colonoscopy repeat date with Dr Gala Romney and call you with when this should be set up. 2. If you have any recurrent anal pressure possibly due to hemorrhoids, contact us for an appointment or we can try hemorrhoid medications. 3. Follow-up date at time of colonoscopy or in 1 year otherwise. 4. Keep appointments with oncology including CT scans and blood work.

## 2015-02-26 NOTE — Assessment & Plan Note (Addendum)
Incidental finding at lap chole. Sees oncology actively, due soon for blood work and CT abdomen with them. Last colonoscopy 2014 with recommendation for 5 year repeat although she states Dr. Gala Romney indicated yearly colonoscopy. Has chronic LLQ symptoms which seem to be worse in the past couple months, deneis any red flag/warning symptoms. Will discuss with Dr. Gala Romney for recommended repeat colonoscopy interval and notify patient.  ADDENDUM: discussed with Dr. Gala Romney and he agrees to proceed with colonoscopy at this time for surveillance. Will have patient notified and procedure scheduled. Will plan for procedure in the OR with propofol due to polypharmacy and propofol used at previous procedure.

## 2015-02-26 NOTE — Progress Notes (Signed)
Referring Provider: Alonza Bogus, MD Primary Care Physician:  Alonza Bogus, MD Primary GI: Dr. Gala Romney  Chief Complaint  Patient presents with  . 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 Reviewed old records, last colonoscopy 102/2014 showing polypectomy x 2 with path report showing tubular adenoma; recommended repeat in 5 years (09/2018) per Dr. Gala Romney. Last EGD 09/29/13 with non-critical Schatzki's ring s/p 57 Maloney dilation without complication, small hiatal hernia, otherwise normal. Last visit with oncology 11/01/14 patient complained of anal pressure without other noted symptoms but refused anal/rectal exam at that time. Told oncologist she's due for a colonoscopy anyway.  Today she states that she's doing well other than getting over a recent cold she caught from her granddaughter. States it's time for her repeat colonoscopy. States when she had the last one Dr. Gala Romney told her he's putting down for a repeat in 5 years but that he "wanted her to have one every year due to her history of colon cancer." Will check to confirm needed interval for repeat. Her LLQ abdomen chronically "bothers her" but is not painful, unable to charicterize further. This started since her surgery and continues without improvement. In fact, she states it may be a little worse over the past couple months, although it is tolerable. Does also have recurrent dysphagia with solid food and pills, had an EGD with dilation in 2014 which helped a lot, but typically only lasts about 6 months. Has a history of GERD but symptoms are well controlled on her current regimen of Prilosec and dietary restrictions. Denies any hematemesis, melena, hematochezia, unexplained fever/chills, unintentional weight loss or recent change in appetite. Is followed closely by oncology and per patient is having a CT  abdomen/pelvis soon  Denies any other upper or lower GI symptoms. Specifically she states the anal pressure she was having when she saw the oncologist has resolved and typically only occurs when she's having a flare up of her back pain. She spoke to her Chiropractor about this and was told it is common. Advised her we'd be happy to evaluate if her symptoms return, for which she expressed appreciation.    Past Medical History  Diagnosis Date  . Arthritis   . COPD (chronic obstructive pulmonary disease)   . Bronchitis   . Fibromyalgia   . Interstitial cystitis   . Migraine   . Depression with anxiety   . UTI (lower urinary tract infection)   . RLS (restless legs syndrome)   . Alopecia, unspecified   . Cervicalgia   . Tobacco use disorder   . PTSD (post-traumatic stress disorder)   . IBS (irritable bowel syndrome)   . GERD (gastroesophageal reflux disease)   . Dysphagia   . PONV (postoperative nausea and vomiting)   . Shortness of breath   . Anxiety   . Carcinoid tumor of appendix   . 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 Reoperation for her lymph node dissection revealed all nodes to be absolutely negative.   . Kidney stones   . Renal failure   . Cancer     carcinoid tumor of appendix    Past Surgical History  Procedure Laterality Date  . Abdominal hysterectomy    .  Cholecystectomy    . Tonsillectomy    . Cystostomy w/ bladder dilation      x 52 [place]  . Esophageal dilation  01/01/2012    RMR: Normal esophagus - status post dilation with 59 F Maloney dilation, gastric polyps benign path  . Laparoscopic appendectomy  01/05/2012    Procedure: APPENDECTOMY LAPAROSCOPIC;  Surgeon: Jamesetta So;  Location: [facility];  Service: General;  Laterality: N/A;  . Appendectomy  01/05/2012    with incidental finding of carcinoid  . Givens capsule  study  01/27/2012    normal  . Colon resection  01/30/2012    Procedure: HAND ASSISTED LAPAROSCOPIC COLON RESECTION;  Surgeon: Jamesetta So, MD;  Location: [facility];  Service: General;  Laterality: N/A;  . Partial colectomy  01/30/2012    Procedure: PARTIAL COLECTOMY;  Surgeon: Jamesetta So, MD;  Location: [facility];  Service: General;  Laterality: N/A;  . Hemicolectomy    . Ileo-colonoscopy  01/01/12    RMR: multiple colonic polyps, largest polyp in proximal sigmoid colon. Tubulovillous adenoma with high grade dysplasia, tubular adenoma  . Abdominal surgery    . Colon surgery    . Colonoscopy with propofol N/A 09/29/2013    MWN:UUVOZD and colonic polyps-removed as described above Status post right hemicolectomy  . Esophagogastroduodenoscopy (egd) with propofol N/A 09/29/2013    GUY:[id number] appearing Schatzki's ring-status post dilation as described above. Hiatal hernia.  Venia Minks dilation N/A 09/29/2013    Procedure: Venia Minks DILATION;  Surgeon: Daneil Dolin, MD;  Location: [facility];  Service: Endoscopy;  Laterality: N/A;  55  . Polypectomy N/A 09/29/2013    Procedure: POLYPECTOMY;  Surgeon: Daneil Dolin, MD;  Location: [facility];  Service: Endoscopy;  Laterality: N/A;  . Cardiac surgery    . Cardiac catheterization      no stents needed    Current Outpatient Prescriptions  Medication Sig Dispense Refill  . acetaminophen (TYLENOL) 500 MG tablet Take 1,000 mg by mouth every 6 (six) hours as needed for pain.    Marland Kitchen albuterol (PROVENTIL HFA;VENTOLIN HFA) 108 (90 BASE) MCG/ACT inhaler Inhale 1-2 puffs into the lungs every 6 (six) hours as needed for wheezing. 1 Inhaler 0  . albuterol (PROVENTIL) (2.5 MG/3ML) 0.083% nebulizer solution Take 2.5 mg by nebulization every 6 (six) hours as needed. Shortness of breath.    . ALPRAZolam (XANAX) 1 MG tablet Take 1 mg by mouth daily as needed. For anxiety and sleep    . amphetamine-dextroamphetamine (ADDERALL) 20 MG tablet Take 10-20 mg by mouth daily. For  attention    . cetirizine (ZYRTEC) 10 MG tablet Take 10 mg by mouth every morning.     . cycloSPORINE (RESTASIS) 0.05 % ophthalmic emulsion Place 1 drop into both eyes 2 (two) times daily as needed. For dry eyes    . estradiol (VIVELLE-DOT) 0.1 MG/24HR Place 2 patches onto the skin every 3 (three) days.     Marland Kitchen FLUoxetine (PROZAC) 40 MG capsule Take 40 mg by mouth every morning.     . fluticasone (FLONASE) 50 MCG/ACT nasal spray Place 2 sprays into the nose daily.    . furosemide (LASIX) 40 MG tablet Take 40 mg by mouth daily.     Marland Kitchen levofloxacin (LEVAQUIN) 500 MG tablet Take 500 mg by mouth daily.    Marland Kitchen omeprazole (PRILOSEC) 20 MG capsule Take 1 capsule (20 mg total) by mouth daily. 30 capsule 3  . Oxycodone HCl 10 MG TABS Take 10 mg by mouth  every 4 (four) hours as needed (for pain).    . potassium chloride SA (K-DUR,KLOR-CON) 20 MEQ tablet Take 20 mEq by mouth daily.     . Pramipexole Dihydrochloride (MIRAPEX PO) Take by mouth daily.    Marland Kitchen rOPINIRole (REQUIP) 2 MG tablet Take 2 mg by mouth at bedtime.     No current facility-administered medications for this visit.    Allergies as of 02/26/2015 - Review Complete 02/26/2015  Allergen Reaction Noted  . Ativan [lorazepam] Other (See Comments) 01/29/2012  . Clarithromycin Itching and Nausea And Vomiting   . Ibuprofen Other (See Comments) 01/08/2013  . Pseudoephedrine Hives   . Reglan [metoclopramide] Other (See Comments) 06/14/2013  . Stadol [butorphanol tartrate] Nausea And Vomiting 07/19/2012  . Sulfa antibiotics Nausea And Vomiting 08/28/2011  . Toradol [ketorolac tromethamine] Other (See Comments) 07/30/2012  . Factive [gemifloxacin mesylate] Rash 08/28/2011  . Other Rash 07/19/2012  . Prochlorperazine edisylate Anxiety   . Promethazine hcl Anxiety   . Tofranil-pm Rash 01/02/2012  . Trimethobenzamide hcl Anxiety     Family History  Problem Relation Age of Onset  . Coronary artery disease Mother   . Heart attack Mother   .  Pulmonary embolism Mother   . Alcohol abuse Father   . COPD Father   . Lung disease Father   . Anesthesia problems Neg Hx   . Hypotension Neg Hx   . Malignant hyperthermia Neg Hx   . Pseudochol deficiency Neg Hx   . Other Neg Hx     History   Social History  . Marital Status: Married    Spouse Name: N/A  . Number of Children: 1  . Years of Education: N/A   Occupational History  . disabled    Social History Main Topics  . Smoking status: Current Some Day Smoker -- 0.25 packs/day for 8 years    Types: Cigarettes    Last Attempt to Quit: 01/25/2012  . Smokeless tobacco: Never Used  . Alcohol Use: No  . Drug Use: No  . Sexual Activity: Yes    Birth Control/ Protection: Surgical   Other Topics Concern  . None   Social History Narrative    Review of Systems: Gen: Denies fever, chills, anorexia. Denies fatigue, weakness, weight loss.  CV: Denies chest pain, palpitations, syncope, peripheral edema, and claudication. Resp: Denies dyspnea at rest, cough, wheezing, coughing up blood, and pleurisy. GI: Denies vomiting blood, jaundice, and fecal incontinence.   Denies dysphagia or odynophagia. Derm: Denies rash, itching, dry skin Psych: Denies depression, anxiety, memory loss, confusion. No homicidal or suicidal ideation.  Heme: Denies bruising, bleeding, and enlarged lymph nodes.  Physical Exam: BP 128/75 mmHg  Pulse 77  Temp(Src) 97.1 F (36.2 C)  Ht 5\' 2"  (1.575 m)  Wt 164 lb 12.8 oz (74.753 kg)  BMI 30.13 kg/m2  LMP 12/29/1993 General:   Alert and oriented. No distress noted. Pleasant and cooperative.  Head:  Normocephalic and atraumatic. Eyes:  Conjuctiva clear without scleral icterus. Mouth:  Oral mucosa pink and moist. Good dentition. No lesions. Neck:  Supple, without mass or thyromegaly. Lungs:  Clear to auscultation bilaterally. No wheezes, rales, or rhonchi. No distress.  Heart:  S1, S2 present without murmurs, rubs, or gallops. Regular rate and  rhythm. Abdomen:  +BS, soft, non-tender and non-distended. No rebound or guarding. No HSM or masses noted. Msk:  Symmetrical without gross deformities. Normal posture. Pulses:  2+ DP noted bilaterally Extremities:  Without edema. Neurologic:  Alert and  oriented  x4;  grossly normal neurologically. Skin:  Intact without significant lesions or rashes. Cervical Nodes:  No significant cervical adenopathy. Psych:  Alert and cooperative. Normal mood and affect.    02/26/2015 11:33 AM

## 2015-02-26 NOTE — Assessment & Plan Note (Signed)
Continued dysphagia, had EGD with dilation 2014 which worked well for 6 months but is now having some recurrent symptoms. Can consider barium pill esophagram if symptoms persist for further evaluation, will advise patient when we notify her of interval for colonoscopy. No red flag/warning signs.

## 2015-03-02 ENCOUNTER — Telehealth: Payer: Self-pay | Admitting: Nurse Practitioner

## 2015-03-02 NOTE — Telephone Encounter (Signed)
Please schedule patient for surveillance colonoscopy in the OR with propofol (polypharmacy) with RMR due to history of appendiceal carcinoid. See my note for more details. Please notify patient.Is not on anticoagulation or any DM meds.

## 2015-03-05 ENCOUNTER — Other Ambulatory Visit: Payer: Self-pay

## 2015-03-05 MED ORDER — PEG 3350-KCL-NA BICARB-NACL 420 G PO SOLR: 4000.0000 mL | Freq: Once | ORAL | Status: DC

## 2015-03-05 NOTE — Telephone Encounter (Signed)
Pt is scheduled for 03/15/2015 . Instructions mailed and pt aware

## 2015-03-06 NOTE — Progress Notes (Signed)
cc'ed to pcp °

## 2015-03-13 ENCOUNTER — Encounter (HOSPITAL_COMMUNITY): Payer: Self-pay

## 2015-03-13 ENCOUNTER — Encounter (HOSPITAL_COMMUNITY): Admission: RE | Admit: 2015-03-13 | Payer: Medicare Other | Source: Ambulatory Visit

## 2015-03-13 NOTE — Patient Instructions (Signed)
[name]  03/13/2015   Your procedure is scheduled on:  03/15/2015  Report to [facility] at 8:45 AM.  Call this number if you have problems the morning of surgery: [phone]   Remember:   Do not eat food or drink liquids after midnight.   Take these medicines the morning of surgery with A SIP OF WATER: Albuterol (bring with you),  Xanax, Zyrtec, Prozac, Prilosec,   Oxy HCL, Mirapex   Do not wear jewelry, make-up or nail polish.  Do not wear lotions, powders, or perfumes. You may wear deodorant.  Do not shave 48 hours prior to surgery. Men may shave face and neck.  Do not bring valuables to the hospital.  [facility] is not responsible for any belongings or valuables.               Contacts, dentures or bridgework may not be worn into surgery.  Leave suitcase in the car. After surgery it may be brought to your room.  For patients admitted to the hospital, discharge time is determined by your treatment team.               Patients discharged the day of surgery will not be allowed to drive home.  Name and phone number of your driver:   Special Instructions: N/A   Please read over the following fact sheets that you were given: Anesthesia Post-op Instructions   PATIENT INSTRUCTIONS POST-ANESTHESIA  IMMEDIATELY FOLLOWING SURGERY:  Do not drive or operate machinery for the first twenty four hours after surgery.  Do not make any important decisions for twenty four hours after surgery or while taking narcotic pain medications or sedatives.  If you develop intractable nausea and vomiting or a severe headache please notify your doctor immediately.  FOLLOW-UP:  Please make an appointment with your surgeon as instructed. You do not need to follow up with anesthesia unless specifically instructed to do so.  WOUND CARE INSTRUCTIONS (if applicable):  Keep a dry clean dressing on the anesthesia/puncture wound site if there is drainage.  Once the wound has quit draining you may leave it open  to air.  Generally you should leave the bandage intact for twenty four hours unless there is drainage.  If the epidural site drains for more than 36-48 hours please call the anesthesia department.  QUESTIONS?:  Please feel free to call your physician or the hospital operator if you have any questions, and they will be happy to assist you.      Colonoscopy A colonoscopy is an exam to look at the entire large intestine (colon). This exam can help find problems such as tumors, polyps, inflammation, and areas of bleeding. The exam takes about 1 hour.  LET [facility] CARE PROVIDER KNOW ABOUT:   Any allergies you have.  All medicines you are taking, including vitamins, herbs, eye drops, creams, and over-the-counter medicines.  Previous problems you or members of your family have had with the use of anesthetics.  Any blood disorders you have.  Previous surgeries you have had.  Medical conditions you have. RISKS AND COMPLICATIONS  Generally, this is a safe procedure. However, as with any procedure, complications can occur. Possible complications include:  Bleeding.  Tearing or rupture of the colon wall.  Reaction to medicines given during the exam.  Infection (rare). BEFORE THE PROCEDURE   Ask your health care provider about changing or stopping your regular medicines.  You may be prescribed an oral bowel prep. This involves drinking  a large amount of medicated liquid, starting the day before your procedure. The liquid will cause you to have multiple loose stools until your stool is almost clear or light green. This cleans out your colon in preparation for the procedure.  Do not eat or drink anything else once you have started the bowel prep, unless your health care provider tells you it is safe to do so.  Arrange for someone to drive you home after the procedure. PROCEDURE   You will be given medicine to help you relax (sedative).  You will lie on your side with your knees  bent.  A long, flexible tube with a light and camera on the end (colonoscope) will be inserted through the rectum and into the colon. The camera sends video back to a computer screen as it moves through the colon. The colonoscope also releases carbon dioxide gas to inflate the colon. This helps your health care provider see the area better.  During the exam, your health care provider may take a small tissue sample (biopsy) to be examined under a microscope if any abnormalities are found.  The exam is finished when the entire colon has been viewed. AFTER THE PROCEDURE   Do not drive for 24 hours after the exam.  You may have a small amount of blood in your stool.  You may pass moderate amounts of gas and have mild abdominal cramping or bloating. This is caused by the gas used to inflate your colon during the exam.  Ask when your test results will be ready and how you will get your results. Make sure you get your test results. Document Released: 12/12/2000 Document Revised: 10/05/2013 Document Reviewed: 08/22/2013 [facility] Patient Information 2015 [place], [place]. This information is not intended to replace advice given to you by your health care provider. Make sure you discuss any questions you have with your health care provider.

## 2015-03-14 ENCOUNTER — Encounter (HOSPITAL_COMMUNITY): Payer: Self-pay

## 2015-03-14 ENCOUNTER — Other Ambulatory Visit: Payer: Self-pay

## 2015-03-14 ENCOUNTER — Encounter (HOSPITAL_COMMUNITY)
Admission: RE | Admit: 2015-03-14 | Discharge: 2015-03-14 | Disposition: A | Discharge status: Home / Self Care | Payer: Medicare Other | Source: Ambulatory Visit | Attending: Internal Medicine | Admitting: Internal Medicine

## 2015-03-14 DIAGNOSIS — F17210 Nicotine dependence, cigarettes, uncomplicated: Secondary | ICD-10-CM | POA: Diagnosis not present

## 2015-03-14 DIAGNOSIS — Z85038 Personal history of other malignant neoplasm of large intestine: Secondary | ICD-10-CM | POA: Diagnosis not present

## 2015-03-14 DIAGNOSIS — Z87442 Personal history of urinary calculi: Secondary | ICD-10-CM | POA: Diagnosis not present

## 2015-03-14 DIAGNOSIS — Z888 Allergy status to other drugs, medicaments and biological substances status: Secondary | ICD-10-CM | POA: Diagnosis not present

## 2015-03-14 DIAGNOSIS — M1990 Unspecified osteoarthritis, unspecified site: Secondary | ICD-10-CM | POA: Diagnosis not present

## 2015-03-14 DIAGNOSIS — Z90710 Acquired absence of both cervix and uterus: Secondary | ICD-10-CM | POA: Diagnosis not present

## 2015-03-14 DIAGNOSIS — G43909 Migraine, unspecified, not intractable, without status migrainosus: Secondary | ICD-10-CM | POA: Diagnosis not present

## 2015-03-14 DIAGNOSIS — F419 Anxiety disorder, unspecified: Secondary | ICD-10-CM | POA: Diagnosis not present

## 2015-03-14 DIAGNOSIS — Z882 Allergy status to sulfonamides status: Secondary | ICD-10-CM | POA: Diagnosis not present

## 2015-03-14 DIAGNOSIS — F17200 Nicotine dependence, unspecified, uncomplicated: Secondary | ICD-10-CM | POA: Diagnosis not present

## 2015-03-14 DIAGNOSIS — F909 Attention-deficit hyperactivity disorder, unspecified type: Secondary | ICD-10-CM | POA: Diagnosis not present

## 2015-03-14 DIAGNOSIS — J449 Chronic obstructive pulmonary disease, unspecified: Secondary | ICD-10-CM | POA: Diagnosis not present

## 2015-03-14 DIAGNOSIS — Z79899 Other long term (current) drug therapy: Secondary | ICD-10-CM | POA: Diagnosis not present

## 2015-03-14 DIAGNOSIS — F329 Major depressive disorder, single episode, unspecified: Secondary | ICD-10-CM | POA: Diagnosis not present

## 2015-03-14 DIAGNOSIS — G2581 Restless legs syndrome: Secondary | ICD-10-CM | POA: Diagnosis not present

## 2015-03-14 DIAGNOSIS — K219 Gastro-esophageal reflux disease without esophagitis: Secondary | ICD-10-CM | POA: Diagnosis not present

## 2015-03-14 DIAGNOSIS — Z08 Encounter for follow-up examination after completed treatment for malignant neoplasm: Secondary | ICD-10-CM | POA: Diagnosis not present

## 2015-03-14 LAB — BASIC METABOLIC PANEL
Anion gap: 5 (ref 5–15)
BUN: 11 mg/dL (ref 6–23)
CO2: 30 mmol/L (ref 19–32)
Calcium: 8.9 mg/dL (ref 8.4–10.5)
Chloride: 102 mmol/L (ref 96–112)
Creatinine, Ser: 0.82 mg/dL (ref 0.50–1.10)
GFR calc Af Amer: >90 mL/min (ref >90)
GFR, EST NON AFRICAN AMERICAN: 81 mL/min — AB (ref >90)
Glucose, Bld: 101 mg/dL — ABNORMAL HIGH (ref 70–99)
Potassium: 4.2 mmol/L (ref 3.5–5.1)
Sodium: 137 mmol/L (ref 135–145)

## 2015-03-14 LAB — CBC
HEMATOCRIT: 37.1 % (ref 36.0–46.0)
Hemoglobin: 12.7 g/dL (ref 12.0–15.0)
MCH: 30.5 pg (ref 26.0–34.0)
MCHC: 34.2 g/dL (ref 30.0–36.0)
MCV: 89 fL (ref 78.0–100.0)
Platelets: 336 10*3/uL (ref 150–400)
RBC: 4.17 MIL/uL (ref 3.87–5.11)
RDW: 13.1 % (ref 11.5–15.5)
WBC: 8.2 10*3/uL (ref 4.0–10.5)

## 2015-03-15 ENCOUNTER — Encounter (HOSPITAL_COMMUNITY)
Admission: RE | Disposition: A | Discharge status: Home / Self Care | Payer: Self-pay | Source: Ambulatory Visit | Attending: Internal Medicine

## 2015-03-15 ENCOUNTER — Ambulatory Visit (HOSPITAL_COMMUNITY)
Admission: RE | Admit: 2015-03-15 | Discharge: 2015-03-15 | Disposition: A | Discharge status: Home / Self Care | Payer: Medicare Other | Source: Ambulatory Visit | Attending: Internal Medicine | Admitting: Internal Medicine

## 2015-03-15 ENCOUNTER — Encounter (HOSPITAL_COMMUNITY): Payer: Self-pay | Admitting: *Deleted

## 2015-03-15 ENCOUNTER — Ambulatory Visit (HOSPITAL_COMMUNITY): Payer: Medicare Other | Admitting: Anesthesiology

## 2015-03-15 DIAGNOSIS — M1990 Unspecified osteoarthritis, unspecified site: Secondary | ICD-10-CM | POA: Diagnosis not present

## 2015-03-15 DIAGNOSIS — F329 Major depressive disorder, single episode, unspecified: Secondary | ICD-10-CM | POA: Diagnosis not present

## 2015-03-15 DIAGNOSIS — G2581 Restless legs syndrome: Secondary | ICD-10-CM | POA: Insufficient documentation

## 2015-03-15 DIAGNOSIS — J449 Chronic obstructive pulmonary disease, unspecified: Secondary | ICD-10-CM | POA: Diagnosis not present

## 2015-03-15 DIAGNOSIS — Z85038 Personal history of other malignant neoplasm of large intestine: Secondary | ICD-10-CM | POA: Insufficient documentation

## 2015-03-15 DIAGNOSIS — Z882 Allergy status to sulfonamides status: Secondary | ICD-10-CM | POA: Diagnosis not present

## 2015-03-15 DIAGNOSIS — K219 Gastro-esophageal reflux disease without esophagitis: Secondary | ICD-10-CM | POA: Diagnosis not present

## 2015-03-15 DIAGNOSIS — F17210 Nicotine dependence, cigarettes, uncomplicated: Secondary | ICD-10-CM | POA: Diagnosis not present

## 2015-03-15 DIAGNOSIS — Z888 Allergy status to other drugs, medicaments and biological substances status: Secondary | ICD-10-CM | POA: Diagnosis not present

## 2015-03-15 DIAGNOSIS — Z85030 Personal history of malignant carcinoid tumor of large intestine: Secondary | ICD-10-CM | POA: Diagnosis not present

## 2015-03-15 DIAGNOSIS — Z79899 Other long term (current) drug therapy: Secondary | ICD-10-CM | POA: Insufficient documentation

## 2015-03-15 DIAGNOSIS — Z86010 Personal history of colonic polyps: Secondary | ICD-10-CM | POA: Insufficient documentation

## 2015-03-15 DIAGNOSIS — D3A020 Benign carcinoid tumor of the appendix: Secondary | ICD-10-CM

## 2015-03-15 DIAGNOSIS — Z87442 Personal history of urinary calculi: Secondary | ICD-10-CM | POA: Insufficient documentation

## 2015-03-15 DIAGNOSIS — G43909 Migraine, unspecified, not intractable, without status migrainosus: Secondary | ICD-10-CM | POA: Diagnosis not present

## 2015-03-15 DIAGNOSIS — Z90710 Acquired absence of both cervix and uterus: Secondary | ICD-10-CM | POA: Insufficient documentation

## 2015-03-15 DIAGNOSIS — Z08 Encounter for follow-up examination after completed treatment for malignant neoplasm: Secondary | ICD-10-CM | POA: Diagnosis not present

## 2015-03-15 HISTORY — PX: COLONOSCOPY WITH PROPOFOL: SHX5780

## 2015-03-15 SURGERY — COLONOSCOPY WITH PROPOFOL
Anesthesia: Monitor Anesthesia Care

## 2015-03-15 MED ORDER — MIDAZOLAM HCL 2 MG/2ML IJ SOLN
INTRAMUSCULAR | Status: AC
  Filled 2015-03-15: qty 4

## 2015-03-15 MED ORDER — FENTANYL CITRATE 0.05 MG/ML IJ SOLN
INTRAMUSCULAR | Status: AC
  Filled 2015-03-15: qty 2

## 2015-03-15 MED ORDER — LIDOCAINE HCL (CARDIAC) 10 MG/ML IV SOLN
INTRAVENOUS | Status: DC | PRN
  Administered 2015-03-15: 50 mg via INTRAVENOUS

## 2015-03-15 MED ORDER — FENTANYL CITRATE 0.05 MG/ML IJ SOLN
25.0000 ug | INTRAMUSCULAR | Status: AC
  Administered 2015-03-15 (×2): 25 ug via INTRAVENOUS

## 2015-03-15 MED ORDER — ONDANSETRON HCL 4 MG/2ML IJ SOLN
4.0000 mg | Freq: Once | INTRAMUSCULAR | Status: AC
  Administered 2015-03-15: 4 mg via INTRAVENOUS

## 2015-03-15 MED ORDER — MIDAZOLAM HCL 2 MG/2ML IJ SOLN
1.0000 mg | INTRAMUSCULAR | Status: AC | PRN
  Administered 2015-03-15 (×3): 2 mg via INTRAVENOUS

## 2015-03-15 MED ORDER — PROPOFOL INFUSION 10 MG/ML OPTIME
INTRAVENOUS | Status: DC | PRN
  Administered 2015-03-15: 200 ug/kg/min via INTRAVENOUS

## 2015-03-15 MED ORDER — MIDAZOLAM HCL 2 MG/2ML IJ SOLN
INTRAMUSCULAR | Status: AC
  Filled 2015-03-15: qty 2

## 2015-03-15 MED ORDER — LACTATED RINGERS IV SOLN
INTRAVENOUS | Status: DC
  Administered 2015-03-15: 10:00:00 via INTRAVENOUS

## 2015-03-15 MED ORDER — ONDANSETRON HCL 4 MG/2ML IJ SOLN: 4.0000 mg | Freq: Once | INTRAMUSCULAR | Status: DC | PRN

## 2015-03-15 MED ORDER — LIDOCAINE HCL (PF) 1 % IJ SOLN
INTRAMUSCULAR | Status: AC
  Filled 2015-03-15: qty 5

## 2015-03-15 MED ORDER — FENTANYL CITRATE 0.05 MG/ML IJ SOLN: 25.0000 ug | INTRAMUSCULAR | Status: DC | PRN

## 2015-03-15 MED ORDER — LIDOCAINE HCL (PF) 1 % IJ SOLN
INTRAMUSCULAR | Status: AC
  Filled 2015-03-15: qty 10

## 2015-03-15 MED ORDER — STERILE WATER FOR IRRIGATION IR SOLN
Status: DC | PRN
  Administered 2015-03-15: 1000 mL

## 2015-03-15 MED ORDER — WATER FOR IRRIGATION, STERILE IR SOLN
Status: DC | PRN
  Administered 2015-03-15: 1000 mL

## 2015-03-15 MED ORDER — ONDANSETRON HCL 4 MG/2ML IJ SOLN
INTRAMUSCULAR | Status: AC
  Filled 2015-03-15: qty 2

## 2015-03-15 SURGICAL SUPPLY — 23 items
ELECT REM PT RETURN 9FT ADLT (ELECTROSURGICAL)
ELECTRODE REM PT RTRN 9FT ADLT (ELECTROSURGICAL) IMPLANT
FCP BXJMBJMB 240X2.8X (CUTTING FORCEPS)
FLOOR PAD 36X40 (MISCELLANEOUS) ×3
FORCEPS BIOP RAD 4 LRG CAP 4 (CUTTING FORCEPS) ×3 IMPLANT
FORCEPS BIOP RJ4 240 W/NDL (CUTTING FORCEPS)
FORCEPS BXJMBJMB 240X2.8X (CUTTING FORCEPS) IMPLANT
FORMALIN 10 PREFIL 20ML (MISCELLANEOUS) IMPLANT
INJECTOR/SNARE I SNARE (MISCELLANEOUS) IMPLANT
KIT CLEAN ENDO COMPLIANCE (KITS) ×3 IMPLANT
LUBRICANT JELLY 4.5OZ STERILE (MISCELLANEOUS) ×3 IMPLANT
MANIFOLD NEPTUNE II (INSTRUMENTS) ×3 IMPLANT
NEEDLE SCLEROTHERAPY 25GX240 (NEEDLE) IMPLANT
PAD FLOOR 36X40 (MISCELLANEOUS) ×1 IMPLANT
PROBE APC STR FIRE (PROBE) IMPLANT
PROBE INJECTION GOLD (MISCELLANEOUS)
PROBE INJECTION GOLD 7FR (MISCELLANEOUS) IMPLANT
SNARE ROTATE MED OVAL 20MM (MISCELLANEOUS) IMPLANT
SNARE SHORT THROW 13M SML OVAL (MISCELLANEOUS) IMPLANT
SYR 50ML LL SCALE MARK (SYRINGE) ×3 IMPLANT
TRAP SPECIMEN MUCOUS 40CC (MISCELLANEOUS) IMPLANT
TUBING IRRIGATION ENDOGATOR (MISCELLANEOUS) ×3 IMPLANT
WATER STERILE IRR 1000ML POUR (IV SOLUTION) ×3 IMPLANT

## 2015-03-15 NOTE — Anesthesia Postprocedure Evaluation (Signed)
  Anesthesia Post-op Note  Patient: [name]  Procedure(s) Performed: Procedure(s): COLONOSCOPY WITH PROPOFOL (N/A)  Patient Location: PACU  Anesthesia Type:MAC  Level of Consciousness: awake and patient cooperative  Airway and Oxygen Therapy: Patient Spontanous Breathing and Patient connected to nasal cannula oxygen  Post-op Pain: mild  Post-op Assessment: Post-op Vital signs reviewed, Patient's Cardiovascular Status Stable, Respiratory Function Stable, Patent Airway and No signs of Nausea or vomiting  Post-op Vital Signs: Reviewed and stable  Last Vitals:  Filed Vitals:   03/15/15 1123  BP: 111/68  Pulse: 35  Temp: 36.7 C  Resp: 24    Complications: No apparent anesthesia complications

## 2015-03-15 NOTE — Anesthesia Procedure Notes (Signed)
Procedure Name: MAC Date/Time: 03/15/2015 10:47 AM Performed by: Vista Deck Pre-anesthesia Checklist: Patient identified, Emergency Drugs available, Suction available, Timeout performed and Patient being monitored Patient Re-evaluated:Patient Re-evaluated prior to inductionOxygen Delivery Method: Non-rebreather mask

## 2015-03-15 NOTE — Interval H&P Note (Signed)
History and Physical Interval Note:  03/15/2015 10:44 AM  [name]  has presented today for surgery, with the diagnosis of history of appendiceal carcinoid  The various methods of treatment have been discussed with the patient and family. After consideration of risks, benefits and other options for treatment, the patient has consented to  Procedure(s): COLONOSCOPY WITH PROPOFOL; IN CECUM AT  (N/A) as a surgical intervention .  The patient's history has been reviewed, patient examined, no change in status, stable for surgery.  I have reviewed the patient's chart and labs.  Questions were answered to the patient's satisfaction.     Szymon Foiles  No change. Surveillance colonoscopy per plan.The risks, benefits, limitations, alternatives and imponderables have been reviewed with the patient. Questions have been answered. All parties are agreeable.

## 2015-03-15 NOTE — Transfer of Care (Signed)
Immediate Anesthesia Transfer of Care Note  Patient: [name]  Procedure(s) Performed: Procedure(s): COLONOSCOPY WITH PROPOFOL (N/A)  Patient Location: PACU  Anesthesia Type:MAC  Level of Consciousness: awake and patient cooperative  Airway & Oxygen Therapy: Patient Spontanous Breathing and non-rebreather face mask  Post-op Assessment: Report given to RN, Post -op Vital signs reviewed and stable and Patient moving all extremities  Post vital signs: Reviewed and stable    Complications: No apparent anesthesia complications

## 2015-03-15 NOTE — Op Note (Signed)
[place], [id number]   COLONOSCOPY PROCEDURE REPORT  PATIENT: [name], [name]  MR#: [id number] BIRTHDATE: 07-11-1961 , 53  yrs. old GENDER: female ENDOSCOPIST: R.  Garfield Cornea, MD FACP [facility] REFERRED OF:BP[facility] Luan Pulling, M.D.  Robynn Pane, P.A. PROCEDURE DATE:  04/05/15 PROCEDURE:   Colonoscopy, surveillance INDICATIONS:Surveillance examination; history of colonic adenoma; history of appendiceal carcinoid incidentally found at appendectomy. MEDICATIONS: Deep sedation per Dr.  [facility] ASA CLASS:       Class II  CONSENT: The risks, benefits, alternatives and imponderables including but not limited to bleeding, perforation as well as the possibility of a missed lesion have been reviewed.  The potential for biopsy, lesion removal, etc. have also been discussed. Questions have been answered.  All parties agreeable.  Please see the history and physical in the medical record for more information.  DESCRIPTION OF PROCEDURE:   After the risks benefits and alternatives of the procedure were thoroughly explained, informed consent was obtained.  The digital rectal exam revealed no abnormalities of the rectum.   The     endoscope was introduced through the anus and advanced to the surgical anastomosis. No adverse events experienced.   The quality of the prep was adequate The instrument was then slowly withdrawn as the colon was fully examined.      COLON FINDINGS: Normal-appearing rectal mucosa.  Normal-appearing residual colonic mucosa.  Surgical anastomosis readily identified. The distal neoterminal ileum was intubated a good 20 cm.  The anastomosis as well as the neoterminal ileum mucosa also appeared normal.  Retroflexion was performed. .  Withdrawal time=cecal withdrawal time not applicable     .  The scope was withdrawn and the procedure completed. COMPLICATIONS: There were no immediate complications.  ENDOSCOPIC  IMPRESSION: Normal colonoscopy  -  status post right hemicolectomy  RECOMMENDATIONS: Repeat colonoscopy in 5 years for surveillance purposes.  eSigned:  R. Garfield Cornea, MD Rosalita Chessman [facility] 04/05/15 11:17 AM   cc:  CPT CODES: ICD CODES:  The ICD and CPT codes recommended by this software are interpretations from the data that the clinical staff has captured with the software.  The verification of the translation of this report to the ICD and CPT codes and modifiers is the sole responsibility of the health care institution and practicing physician where this report was generated.  [employer] will not be held responsible for the validity of the ICD and CPT codes included on this report.  AMA assumes no liability for data contained or not contained herein. C[employer] is a Designer, television/film set of the [employer].  PATIENT NAMESharis, [name] MR#: [id number]

## 2015-03-15 NOTE — Discharge Instructions (Signed)

## 2015-03-15 NOTE — H&P (View-Only) (Signed)
Referring Provider: Alonza Bogus, MD Primary Care Physician:  Alonza Bogus, MD Primary GI: Dr. Gala Romney  Chief Complaint  Patient presents with  . 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 Reviewed old records, last colonoscopy 102/2014 showing polypectomy x 2 with path report showing tubular adenoma; recommended repeat in 5 years (09/2018) per Dr. Gala Romney. Last EGD 09/29/13 with non-critical Schatzki's ring s/p 89 Maloney dilation without complication, small hiatal hernia, otherwise normal. Last visit with oncology 11/01/14 patient complained of anal pressure without other noted symptoms but refused anal/rectal exam at that time. Told oncologist she's due for a colonoscopy anyway.  Today she states that she's doing well other than getting over a recent cold she caught from her granddaughter. States it's time for her repeat colonoscopy. States when she had the last one Dr. Gala Romney told her he's putting down for a repeat in 5 years but that he "wanted her to have one every year due to her history of colon cancer." Will check to confirm needed interval for repeat. Her LLQ abdomen chronically "bothers her" but is not painful, unable to charicterize further. This started since her surgery and continues without improvement. In fact, she states it may be a little worse over the past couple months, although it is tolerable. Does also have recurrent dysphagia with solid food and pills, had an EGD with dilation in 2014 which helped a lot, but typically only lasts about 6 months. Has a history of GERD but symptoms are well controlled on her current regimen of Prilosec and dietary restrictions. Denies any hematemesis, melena, hematochezia, unexplained fever/chills, unintentional weight loss or recent change in appetite. Is followed closely by oncology and per patient is having a CT  abdomen/pelvis soon  Denies any other upper or lower GI symptoms. Specifically she states the anal pressure she was having when she saw the oncologist has resolved and typically only occurs when she's having a flare up of her back pain. She spoke to her Chiropractor about this and was told it is common. Advised her we'd be happy to evaluate if her symptoms return, for which she expressed appreciation.    Past Medical History  Diagnosis Date  . Arthritis   . COPD (chronic obstructive pulmonary disease)   . Bronchitis   . Fibromyalgia   . Interstitial cystitis   . Migraine   . Depression with anxiety   . UTI (lower urinary tract infection)   . RLS (restless legs syndrome)   . Alopecia, unspecified   . Cervicalgia   . Tobacco use disorder   . PTSD (post-traumatic stress disorder)   . IBS (irritable bowel syndrome)   . GERD (gastroesophageal reflux disease)   . Dysphagia   . PONV (postoperative nausea and vomiting)   . Shortness of breath   . Anxiety   . Carcinoid tumor of appendix   . 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 Reoperation for her lymph node dissection revealed all nodes to be absolutely negative.   . Kidney stones   . Renal failure   . Cancer     carcinoid tumor of appendix    Past Surgical History  Procedure Laterality Date  . Abdominal hysterectomy    .  Cholecystectomy    . Tonsillectomy    . Cystostomy w/ bladder dilation      x 52 Gambier  . Esophageal dilation  01/01/2012    RMR: Normal esophagus - status post dilation with 33 F Maloney dilation, gastric polyps benign path  . Laparoscopic appendectomy  01/05/2012    Procedure: APPENDECTOMY LAPAROSCOPIC;  Surgeon: Jamesetta So;  Location: [facility];  Service: General;  Laterality: N/A;  . Appendectomy  01/05/2012    with incidental finding of carcinoid  . Givens capsule  study  01/27/2012    normal  . Colon resection  01/30/2012    Procedure: HAND ASSISTED LAPAROSCOPIC COLON RESECTION;  Surgeon: Jamesetta So, MD;  Location: [facility];  Service: General;  Laterality: N/A;  . Partial colectomy  01/30/2012    Procedure: PARTIAL COLECTOMY;  Surgeon: Jamesetta So, MD;  Location: [facility];  Service: General;  Laterality: N/A;  . Hemicolectomy    . Ileo-colonoscopy  01/01/12    RMR: multiple colonic polyps, largest polyp in proximal sigmoid colon. Tubulovillous adenoma with high grade dysplasia, tubular adenoma  . Abdominal surgery    . Colon surgery    . Colonoscopy with propofol N/A 09/29/2013    XIH:[id number] and colonic polyps-removed as described above Status post right hemicolectomy  . Esophagogastroduodenoscopy (egd) with propofol N/A 09/29/2013    CMK:[id number] appearing Schatzki's ring-status post dilation as described above. Hiatal hernia.  Venia Minks dilation N/A 09/29/2013    Procedure: Venia Minks DILATION;  Surgeon: Daneil Dolin, MD;  Location: [facility];  Service: Endoscopy;  Laterality: N/A;  55  . Polypectomy N/A 09/29/2013    Procedure: POLYPECTOMY;  Surgeon: Daneil Dolin, MD;  Location: [facility];  Service: Endoscopy;  Laterality: N/A;  . Cardiac surgery    . Cardiac catheterization      no stents needed    Current Outpatient Prescriptions  Medication Sig Dispense Refill  . acetaminophen (TYLENOL) 500 MG tablet Take 1,000 mg by mouth every 6 (six) hours as needed for pain.    Marland Kitchen albuterol (PROVENTIL HFA;VENTOLIN HFA) 108 (90 BASE) MCG/ACT inhaler Inhale 1-2 puffs into the lungs every 6 (six) hours as needed for wheezing. 1 Inhaler 0  . albuterol (PROVENTIL) (2.5 MG/3ML) 0.083% nebulizer solution Take 2.5 mg by nebulization every 6 (six) hours as needed. Shortness of breath.    . ALPRAZolam (XANAX) 1 MG tablet Take 1 mg by mouth daily as needed. For anxiety and sleep    . amphetamine-dextroamphetamine (ADDERALL) 20 MG tablet Take 10-20 mg by mouth daily. For  attention    . cetirizine (ZYRTEC) 10 MG tablet Take 10 mg by mouth every morning.     . cycloSPORINE (RESTASIS) 0.05 % ophthalmic emulsion Place 1 drop into both eyes 2 (two) times daily as needed. For dry eyes    . estradiol (VIVELLE-DOT) 0.1 MG/24HR Place 2 patches onto the skin every 3 (three) days.     Marland Kitchen FLUoxetine (PROZAC) 40 MG capsule Take 40 mg by mouth every morning.     . fluticasone (FLONASE) 50 MCG/ACT nasal spray Place 2 sprays into the nose daily.    . furosemide (LASIX) 40 MG tablet Take 40 mg by mouth daily.     Marland Kitchen levofloxacin (LEVAQUIN) 500 MG tablet Take 500 mg by mouth daily.    Marland Kitchen omeprazole (PRILOSEC) 20 MG capsule Take 1 capsule (20 mg total) by mouth daily. 30 capsule 3  . Oxycodone HCl 10 MG TABS Take 10 mg by mouth  every 4 (four) hours as needed (for pain).    . potassium chloride SA (K-DUR,KLOR-CON) 20 MEQ tablet Take 20 mEq by mouth daily.     . Pramipexole Dihydrochloride (MIRAPEX PO) Take by mouth daily.    Marland Kitchen rOPINIRole (REQUIP) 2 MG tablet Take 2 mg by mouth at bedtime.     No current facility-administered medications for this visit.    Allergies as of 02/26/2015 - Review Complete 02/26/2015  Allergen Reaction Noted  . Ativan [lorazepam] Other (See Comments) 01/29/2012  . Clarithromycin Itching and Nausea And Vomiting   . Ibuprofen Other (See Comments) 01/08/2013  . Pseudoephedrine Hives   . Reglan [metoclopramide] Other (See Comments) 06/14/2013  . Stadol [butorphanol tartrate] Nausea And Vomiting 07/19/2012  . Sulfa antibiotics Nausea And Vomiting 08/28/2011  . Toradol [ketorolac tromethamine] Other (See Comments) 07/30/2012  . Factive [gemifloxacin mesylate] Rash 08/28/2011  . Other Rash 07/19/2012  . Prochlorperazine edisylate Anxiety   . Promethazine hcl Anxiety   . Tofranil-pm Rash 01/02/2012  . Trimethobenzamide hcl Anxiety     Family History  Problem Relation Age of Onset  . Coronary artery disease Mother   . Heart attack Mother   .  Pulmonary embolism Mother   . Alcohol abuse Father   . COPD Father   . Lung disease Father   . Anesthesia problems Neg Hx   . Hypotension Neg Hx   . Malignant hyperthermia Neg Hx   . Pseudochol deficiency Neg Hx   . Other Neg Hx     History   Social History  . Marital Status: Married    Spouse Name: N/A  . Number of Children: 1  . Years of Education: N/A   Occupational History  . disabled    Social History Main Topics  . Smoking status: Current Some Day Smoker -- 0.25 packs/day for 8 years    Types: Cigarettes    Last Attempt to Quit: 01/25/2012  . Smokeless tobacco: Never Used  . Alcohol Use: No  . Drug Use: No  . Sexual Activity: Yes    Birth Control/ Protection: Surgical   Other Topics Concern  . None   Social History Narrative    Review of Systems: Gen: Denies fever, chills, anorexia. Denies fatigue, weakness, weight loss.  CV: Denies chest pain, palpitations, syncope, peripheral edema, and claudication. Resp: Denies dyspnea at rest, cough, wheezing, coughing up blood, and pleurisy. GI: Denies vomiting blood, jaundice, and fecal incontinence.   Denies dysphagia or odynophagia. Derm: Denies rash, itching, dry skin Psych: Denies depression, anxiety, memory loss, confusion. No homicidal or suicidal ideation.  Heme: Denies bruising, bleeding, and enlarged lymph nodes.  Physical Exam: BP 128/75 mmHg  Pulse 77  Temp(Src) 97.1 F (36.2 C)  Ht 5\' 2"  (1.575 m)  Wt 164 lb 12.8 oz (74.753 kg)  BMI 30.13 kg/m2  LMP 12/29/1993 General:   Alert and oriented. No distress noted. Pleasant and cooperative.  Head:  Normocephalic and atraumatic. Eyes:  Conjuctiva clear without scleral icterus. Mouth:  Oral mucosa pink and moist. Good dentition. No lesions. Neck:  Supple, without mass or thyromegaly. Lungs:  Clear to auscultation bilaterally. No wheezes, rales, or rhonchi. No distress.  Heart:  S1, S2 present without murmurs, rubs, or gallops. Regular rate and  rhythm. Abdomen:  +BS, soft, non-tender and non-distended. No rebound or guarding. No HSM or masses noted. Msk:  Symmetrical without gross deformities. Normal posture. Pulses:  2+ DP noted bilaterally Extremities:  Without edema. Neurologic:  Alert and  oriented  x4;  grossly normal neurologically. Skin:  Intact without significant lesions or rashes. Cervical Nodes:  No significant cervical adenopathy. Psych:  Alert and cooperative. Normal mood and affect.    02/26/2015 11:33 AM

## 2015-03-15 NOTE — Anesthesia Preprocedure Evaluation (Signed)
Anesthesia Evaluation  Patient identified by MRN, date of birth, ID band Patient awake    Reviewed: Allergy & Precautions, H&P , NPO status , Patient's Chart, lab work & pertinent test results  History of Anesthesia Complications (+) PONV and history of anesthetic complications  Airway Mallampati: I  TM Distance: >3 FB Neck ROM: Full    Dental no notable dental hx. (+) Teeth Intact   Pulmonary shortness of breath, asthma , COPDCurrent Smoker,    Pulmonary exam normal       Cardiovascular negative cardio ROS  Rhythm:Regular Rate:Normal     Neuro/Psych  Headaches, PSYCHIATRIC DISORDERS Anxiety Depression  Neuromuscular disease    GI/Hepatic Neg liver ROS, hiatal hernia, GERD-  Medicated and Controlled,  Endo/Other  negative endocrine ROS  Renal/GU negative Renal ROS     Musculoskeletal  (+) Fibromyalgia -  Abdominal Normal abdominal exam  (+)   Peds  Hematology negative hematology ROS (+)   Anesthesia Other Findings   Reproductive/Obstetrics negative OB ROS                             Anesthesia Physical Anesthesia Plan  ASA: III  Anesthesia Plan: MAC   Post-op Pain Management:    Induction: Intravenous  Airway Management Planned: Simple Face Mask  Additional Equipment:   Intra-op Plan:   Post-operative Plan:   Informed Consent: I have reviewed the patients History and Physical, chart, labs and discussed the procedure including the risks, benefits and alternatives for the proposed anesthesia with the patient or authorized representative who has indicated his/her understanding and acceptance.     Plan Discussed with:   Anesthesia Plan Comments:         Anesthesia Quick Evaluation

## 2015-03-16 ENCOUNTER — Encounter (HOSPITAL_COMMUNITY): Payer: Self-pay | Admitting: Internal Medicine

## 2015-03-21 ENCOUNTER — Telehealth (HOSPITAL_COMMUNITY): Payer: Self-pay

## 2015-03-21 ENCOUNTER — Encounter (HOSPITAL_COMMUNITY): Payer: Self-pay | Admitting: Oncology

## 2015-03-21 NOTE — Telephone Encounter (Signed)
Call to patient to discuss myChart message.  Complains with "knots in my right upper thigh that will swell up and go down.  They are on the side that I had my surgery and am concerned about them. Also, want to know if I can get CT scans of my whole body instead of just my abdomen.  Just don't feel good and need to get my scans rescheduled and wonder if my marker needs to be rechecked.?"

## 2015-03-21 NOTE — Telephone Encounter (Signed)
Patient notified and verbalized understanding of instructions. 

## 2015-03-21 NOTE — Telephone Encounter (Signed)
CT scan can be rescheduled of abd/pelvis.  No need for CT of chest as it was normal last year. She has not "felt well" since I met her years ago.  Unless she has new symptoms, insurance will not pay for a CT of chest.  We do not perform CT's of "whole body" unless there is a specific reason.  She would be better served at her primary care provider's office for "not feeling well" since there is no evidence at this time that it is malignancy-induced.  She is not on any treatment from [place].  Spots on her thigh should be evaluated by her primary care provider or dermatologist.  She is scheduled for labs in May, but if she wants to come in earlier for labs ONLY, we can arrange that.  Thank you.    Teddy Rebstock 03/21/2015 4:30 PM

## 2015-03-27 ENCOUNTER — Ambulatory Visit (HOSPITAL_COMMUNITY): Payer: Medicare Other

## 2015-03-29 ENCOUNTER — Ambulatory Visit (HOSPITAL_COMMUNITY): Payer: Medicare Other

## 2015-04-03 ENCOUNTER — Ambulatory Visit (HOSPITAL_COMMUNITY): Payer: Medicare Other

## 2015-05-02 ENCOUNTER — Emergency Department (HOSPITAL_COMMUNITY): Payer: Medicare Other

## 2015-05-02 ENCOUNTER — Other Ambulatory Visit (HOSPITAL_COMMUNITY): Payer: Medicare PPO

## 2015-05-02 ENCOUNTER — Emergency Department (HOSPITAL_COMMUNITY)
Admission: EM | Admit: 2015-05-02 | Discharge: 2015-05-02 | Discharge status: Against Medical Advice | Payer: Medicare Other | Attending: Emergency Medicine | Admitting: Emergency Medicine

## 2015-05-02 ENCOUNTER — Encounter (HOSPITAL_COMMUNITY): Payer: Self-pay | Admitting: Emergency Medicine

## 2015-05-02 DIAGNOSIS — Z720 Tobacco use: Secondary | ICD-10-CM | POA: Diagnosis not present

## 2015-05-02 DIAGNOSIS — J449 Chronic obstructive pulmonary disease, unspecified: Secondary | ICD-10-CM | POA: Diagnosis not present

## 2015-05-02 DIAGNOSIS — R0602 Shortness of breath: Secondary | ICD-10-CM | POA: Insufficient documentation

## 2015-05-02 DIAGNOSIS — R079 Chest pain, unspecified: Secondary | ICD-10-CM | POA: Insufficient documentation

## 2015-05-02 DIAGNOSIS — R42 Dizziness and giddiness: Secondary | ICD-10-CM | POA: Insufficient documentation

## 2015-05-02 LAB — CBC
HCT: 38.7 % (ref 36.0–46.0)
Hemoglobin: 13.3 g/dL (ref 12.0–15.0)
MCH: 30.2 pg (ref 26.0–34.0)
MCHC: 34.4 g/dL (ref 30.0–36.0)
MCV: 88 fL (ref 78.0–100.0)
PLATELETS: 352 10*3/uL (ref 150–400)
RBC: 4.4 MIL/uL (ref 3.87–5.11)
RDW: 12.8 % (ref 11.5–15.5)
WBC: 8.2 10*3/uL (ref 4.0–10.5)

## 2015-05-02 LAB — TROPONIN I: Troponin I: <0.03 ng/mL (ref <0.031)

## 2015-05-02 LAB — BASIC METABOLIC PANEL
Anion gap: 7 (ref 5–15)
BUN: 10 mg/dL (ref 6–20)
CO2: 28 mmol/L (ref 22–32)
CREATININE: 0.95 mg/dL (ref 0.44–1.00)
Calcium: 8.6 mg/dL — ABNORMAL LOW (ref 8.9–10.3)
Chloride: 103 mmol/L (ref 101–111)
GFR calc Af Amer: >60 mL/min (ref >60)
GFR calc non Af Amer: >60 mL/min (ref >60)
Glucose, Bld: 92 mg/dL (ref 70–99)
Potassium: 4 mmol/L (ref 3.5–5.1)
Sodium: 138 mmol/L (ref 135–145)

## 2015-05-02 NOTE — ED Notes (Signed)
Patient called x 1.  Not in waiting room.

## 2015-05-02 NOTE — ED Notes (Signed)
Pt called for room x2. Pt not found in waiting room.

## 2015-05-02 NOTE — ED Notes (Signed)
Patient called for room placement for 3rd time with no response.

## 2015-05-02 NOTE — ED Notes (Signed)
Pt reports chest pain,sob,dizziness for last 4 days. Pt denies any change in pain with movement or deep breath.

## 2015-05-03 ENCOUNTER — Other Ambulatory Visit (HOSPITAL_COMMUNITY): Payer: Medicare Other

## 2015-05-04 ENCOUNTER — Other Ambulatory Visit (HOSPITAL_COMMUNITY): Payer: Medicare Other

## 2015-05-07 ENCOUNTER — Other Ambulatory Visit (HOSPITAL_COMMUNITY): Payer: Medicare Other

## 2015-05-09 ENCOUNTER — Other Ambulatory Visit (HOSPITAL_COMMUNITY): Payer: Medicare Other

## 2015-05-11 ENCOUNTER — Encounter (HOSPITAL_COMMUNITY): Payer: Medicare Other | Attending: Oncology

## 2015-05-11 DIAGNOSIS — C7A020 Malignant carcinoid tumor of the appendix: Secondary | ICD-10-CM

## 2015-05-11 DIAGNOSIS — C181 Malignant neoplasm of appendix: Secondary | ICD-10-CM | POA: Insufficient documentation

## 2015-05-11 DIAGNOSIS — D3A020 Benign carcinoid tumor of the appendix: Secondary | ICD-10-CM

## 2015-05-11 LAB — CBC WITH DIFFERENTIAL/PLATELET
BASOS ABS: 0 10*3/uL (ref 0.0–0.1)
Basophils Relative: 0 % (ref 0–1)
EOS ABS: 0.2 10*3/uL (ref 0.0–0.7)
Eosinophils Relative: 3 % (ref 0–5)
HEMATOCRIT: 38.4 % (ref 36.0–46.0)
Hemoglobin: 13.2 g/dL (ref 12.0–15.0)
Lymphocytes Relative: 33 % (ref 12–46)
Lymphs Abs: 2.2 10*3/uL (ref 0.7–4.0)
MCH: 30.1 pg (ref 26.0–34.0)
MCHC: 34.4 g/dL (ref 30.0–36.0)
MCV: 87.5 fL (ref 78.0–100.0)
MONO ABS: 0.6 10*3/uL (ref 0.1–1.0)
MONOS PCT: 9 % (ref 3–12)
NEUTROS ABS: 3.6 10*3/uL (ref 1.7–7.7)
Neutrophils Relative %: 55 % (ref 43–77)
Platelets: 305 10*3/uL (ref 150–400)
RBC: 4.39 MIL/uL (ref 3.87–5.11)
RDW: 12.6 % (ref 11.5–15.5)
WBC: 6.6 10*3/uL (ref 4.0–10.5)

## 2015-05-11 LAB — COMPREHENSIVE METABOLIC PANEL
ALBUMIN: 3.7 g/dL (ref 3.5–5.0)
ALT: 15 U/L (ref 14–54)
AST: 17 U/L (ref 15–41)
Alkaline Phosphatase: 55 U/L (ref 38–126)
Anion gap: 8 (ref 5–15)
BUN: 11 mg/dL (ref 6–20)
CHLORIDE: 103 mmol/L (ref 101–111)
CO2: 27 mmol/L (ref 22–32)
CREATININE: 0.88 mg/dL (ref 0.44–1.00)
Calcium: 8.3 mg/dL — ABNORMAL LOW (ref 8.9–10.3)
GFR calc Af Amer: >60 mL/min (ref >60)
GFR calc non Af Amer: >60 mL/min (ref >60)
Glucose, Bld: 96 mg/dL (ref 65–99)
Potassium: 3.7 mmol/L (ref 3.5–5.1)
SODIUM: 138 mmol/L (ref 135–145)
Total Bilirubin: 0.3 mg/dL (ref 0.3–1.2)
Total Protein: 6.7 g/dL (ref 6.5–8.1)

## 2015-05-11 NOTE — Progress Notes (Signed)
Labs drawn

## 2015-05-14 LAB — CHROMOGRANIN A: Chromogranin A: 2 nmol/L (ref 0–5)

## 2015-05-15 LAB — SEROTONIN SERUM: Serotonin, Serum: 16 ng/mL (ref 0–420)

## 2015-09-10 DIAGNOSIS — F419 Anxiety disorder, unspecified: Secondary | ICD-10-CM | POA: Diagnosis not present

## 2015-09-10 DIAGNOSIS — K219 Gastro-esophageal reflux disease without esophagitis: Secondary | ICD-10-CM | POA: Diagnosis not present

## 2015-09-10 DIAGNOSIS — E782 Mixed hyperlipidemia: Secondary | ICD-10-CM | POA: Diagnosis not present

## 2015-09-10 DIAGNOSIS — M791 Myalgia: Secondary | ICD-10-CM | POA: Diagnosis not present

## 2015-09-10 DIAGNOSIS — R2689 Other abnormalities of gait and mobility: Secondary | ICD-10-CM | POA: Diagnosis not present

## 2015-09-10 DIAGNOSIS — E559 Vitamin D deficiency, unspecified: Secondary | ICD-10-CM | POA: Diagnosis not present

## 2015-09-10 DIAGNOSIS — J449 Chronic obstructive pulmonary disease, unspecified: Secondary | ICD-10-CM | POA: Diagnosis not present

## 2015-09-10 DIAGNOSIS — F909 Attention-deficit hyperactivity disorder, unspecified type: Secondary | ICD-10-CM | POA: Diagnosis not present

## 2015-09-10 DIAGNOSIS — F329 Major depressive disorder, single episode, unspecified: Secondary | ICD-10-CM | POA: Diagnosis not present

## 2015-09-12 ENCOUNTER — Encounter (HOSPITAL_COMMUNITY): Payer: Self-pay | Admitting: Oncology

## 2015-11-02 ENCOUNTER — Other Ambulatory Visit (HOSPITAL_COMMUNITY): Payer: Medicare Other

## 2015-11-02 ENCOUNTER — Ambulatory Visit (HOSPITAL_COMMUNITY): Payer: Medicare Other | Admitting: Oncology

## 2015-11-02 ENCOUNTER — Ambulatory Visit (HOSPITAL_COMMUNITY): Payer: Medicare PPO | Admitting: Oncology

## 2015-11-02 ENCOUNTER — Other Ambulatory Visit (HOSPITAL_COMMUNITY): Payer: Medicare PPO

## 2015-11-13 ENCOUNTER — Encounter (HOSPITAL_COMMUNITY): Payer: Self-pay

## 2015-11-20 ENCOUNTER — Other Ambulatory Visit (HOSPITAL_COMMUNITY): Payer: Self-pay | Admitting: Pulmonary Disease

## 2015-11-20 DIAGNOSIS — Z1231 Encounter for screening mammogram for malignant neoplasm of breast: Secondary | ICD-10-CM

## 2015-12-07 ENCOUNTER — Ambulatory Visit (HOSPITAL_COMMUNITY): Payer: Medicare Other

## 2015-12-07 ENCOUNTER — Ambulatory Visit (HOSPITAL_COMMUNITY): Payer: Medicare Other | Admitting: Oncology

## 2015-12-10 ENCOUNTER — Ambulatory Visit (HOSPITAL_COMMUNITY): Payer: Medicare Other

## 2015-12-14 ENCOUNTER — Ambulatory Visit (HOSPITAL_COMMUNITY): Payer: Medicare Other | Admitting: Oncology

## 2015-12-17 ENCOUNTER — Ambulatory Visit (HOSPITAL_COMMUNITY): Payer: Medicare Other

## 2015-12-21 ENCOUNTER — Ambulatory Visit (HOSPITAL_COMMUNITY): Payer: Medicare Other

## 2015-12-26 ENCOUNTER — Ambulatory Visit (HOSPITAL_COMMUNITY): Payer: Medicare Other | Admitting: Oncology

## 2016-01-02 ENCOUNTER — Ambulatory Visit (HOSPITAL_COMMUNITY): Payer: Medicare Other

## 2016-01-07 ENCOUNTER — Ambulatory Visit (HOSPITAL_COMMUNITY): Payer: Medicare Other | Admitting: Oncology

## 2016-01-07 ENCOUNTER — Ambulatory Visit (HOSPITAL_COMMUNITY): Payer: Medicare Other

## 2016-01-14 ENCOUNTER — Ambulatory Visit (HOSPITAL_COMMUNITY): Payer: Medicare Other

## 2016-01-21 ENCOUNTER — Ambulatory Visit (HOSPITAL_COMMUNITY): Payer: Medicare Other | Admitting: Oncology

## 2016-01-21 ENCOUNTER — Ambulatory Visit (HOSPITAL_COMMUNITY): Payer: Medicare Other

## 2016-01-25 ENCOUNTER — Ambulatory Visit (HOSPITAL_COMMUNITY): Payer: Medicare Other | Admitting: Oncology

## 2016-01-30 ENCOUNTER — Ambulatory Visit (HOSPITAL_COMMUNITY): Payer: Medicare Other

## 2016-01-31 ENCOUNTER — Ambulatory Visit (HOSPITAL_COMMUNITY)
Admission: RE | Admit: 2016-01-31 | Discharge: 2016-01-31 | Disposition: A | Discharge status: Home / Self Care | Payer: Medicare Other | Source: Ambulatory Visit | Attending: Pulmonary Disease | Admitting: Pulmonary Disease

## 2016-01-31 DIAGNOSIS — Z1231 Encounter for screening mammogram for malignant neoplasm of breast: Secondary | ICD-10-CM

## 2016-02-01 ENCOUNTER — Ambulatory Visit (HOSPITAL_COMMUNITY): Payer: Medicare Other | Admitting: Oncology

## 2016-02-12 ENCOUNTER — Ambulatory Visit (HOSPITAL_COMMUNITY): Payer: Medicare Other | Admitting: Oncology

## 2016-02-28 ENCOUNTER — Ambulatory Visit (HOSPITAL_COMMUNITY): Payer: Medicare Other | Admitting: Oncology

## 2016-02-28 ENCOUNTER — Telehealth (HOSPITAL_COMMUNITY): Payer: Self-pay | Admitting: Oncology

## 2016-02-28 NOTE — Telephone Encounter (Signed)
Today, the patient rescheduled her appointment for the 10th time. She was initially scheduled in November 2016 for her annual follow-up. I've advised scheduling to not reschedule her until I discuss her case with Dr. Luan Pulling.  I did again touch with Dr. Luan Pulling today regarding this patient and her noncompliance with appointments. He reports that she is also rescheduled the last 6 minutes with him as well.  He agrees that we should not reschedule the patient here. I reviewed the patient's case with Dr. Luan Pulling. We've been doing annual CT scans of her abdomen and pelvis for incidentally found carcinoid of her appendix. We've exhausted NCCN guidelines pertaining to surveillance. She is very nervous patient and therefore we've been doing annual scans in addition to annual laboratory work. It would be reasonable to discontinue both laboratory work in addition to her exposure to radiation via imaging tests. Dr. Luan Pulling agrees with this plan. He will see her in follow-up.  Patient will not be rescheduled here at the cancer center unless re-referred by her primary care physician. Dr. Luan Pulling is in agreement with this plan.  Valree Feild, PA-C 02/28/2016 10:50 AM

## 2016-02-29 ENCOUNTER — Encounter (HOSPITAL_COMMUNITY): Payer: Self-pay | Admitting: Oncology

## 2016-03-04 ENCOUNTER — Ambulatory Visit (HOSPITAL_COMMUNITY): Payer: Medicare Other | Admitting: Oncology

## 2016-03-17 ENCOUNTER — Ambulatory Visit (HOSPITAL_COMMUNITY): Payer: Medicare Other | Admitting: Oncology

## 2016-03-28 DIAGNOSIS — J449 Chronic obstructive pulmonary disease, unspecified: Secondary | ICD-10-CM | POA: Diagnosis not present

## 2016-03-28 DIAGNOSIS — K210 Gastro-esophageal reflux disease with esophagitis: Secondary | ICD-10-CM | POA: Diagnosis not present

## 2016-03-28 DIAGNOSIS — G2581 Restless legs syndrome: Secondary | ICD-10-CM | POA: Diagnosis not present

## 2016-03-28 DIAGNOSIS — M791 Myalgia: Secondary | ICD-10-CM | POA: Diagnosis not present

## 2016-06-27 DIAGNOSIS — J449 Chronic obstructive pulmonary disease, unspecified: Secondary | ICD-10-CM | POA: Diagnosis not present

## 2016-06-27 DIAGNOSIS — M791 Myalgia: Secondary | ICD-10-CM | POA: Diagnosis not present

## 2016-06-27 DIAGNOSIS — K210 Gastro-esophageal reflux disease with esophagitis: Secondary | ICD-10-CM | POA: Diagnosis not present

## 2016-06-27 DIAGNOSIS — Z79891 Long term (current) use of opiate analgesic: Secondary | ICD-10-CM | POA: Diagnosis not present

## 2016-06-27 DIAGNOSIS — K219 Gastro-esophageal reflux disease without esophagitis: Secondary | ICD-10-CM | POA: Diagnosis not present

## 2016-07-15 ENCOUNTER — Ambulatory Visit: Payer: Self-pay | Admitting: Allergy and Immunology

## 2016-07-30 ENCOUNTER — Ambulatory Visit (HOSPITAL_COMMUNITY): Payer: Medicare Other | Admitting: Hematology & Oncology

## 2016-08-07 ENCOUNTER — Ambulatory Visit (INDEPENDENT_AMBULATORY_CARE_PROVIDER_SITE_OTHER): Payer: Medicare Other | Admitting: Allergy & Immunology

## 2016-08-07 ENCOUNTER — Encounter: Payer: Self-pay | Admitting: Allergy & Immunology

## 2016-08-07 VITALS — BP 125/70 | HR 75 | Temp 98.0°F | Resp 16 | Ht 60.83 in | Wt 162.0 lb

## 2016-08-07 DIAGNOSIS — J310 Chronic rhinitis: Secondary | ICD-10-CM | POA: Diagnosis not present

## 2016-08-07 DIAGNOSIS — H938X3 Other specified disorders of ear, bilateral: Secondary | ICD-10-CM

## 2016-08-07 DIAGNOSIS — J4540 Moderate persistent asthma, uncomplicated: Secondary | ICD-10-CM | POA: Insufficient documentation

## 2016-08-07 DIAGNOSIS — T485X1A Poisoning by other anti-common-cold drugs, accidental (unintentional), initial encounter: Secondary | ICD-10-CM

## 2016-08-07 DIAGNOSIS — T781XXA Other adverse food reactions, not elsewhere classified, initial encounter: Secondary | ICD-10-CM | POA: Insufficient documentation

## 2016-08-07 DIAGNOSIS — L500 Allergic urticaria: Secondary | ICD-10-CM

## 2016-08-07 DIAGNOSIS — Z889 Allergy status to unspecified drugs, medicaments and biological substances status: Secondary | ICD-10-CM | POA: Diagnosis not present

## 2016-08-07 DIAGNOSIS — T485X5A Adverse effect of other anti-common-cold drugs, initial encounter: Secondary | ICD-10-CM

## 2016-08-07 MED ORDER — BUDESONIDE-FORMOTEROL FUMARATE 160-4.5 MCG/ACT IN AERO: 2.0000 | INHALATION_SPRAY | Freq: Two times a day (BID) | RESPIRATORY_TRACT | 5 refills | Status: DC

## 2016-08-07 MED ORDER — FLUTICASONE PROPIONATE 50 MCG/ACT NA SUSP: 2.0000 | Freq: Every day | NASAL | 5 refills | Status: DC

## 2016-08-07 NOTE — Patient Instructions (Addendum)
1. Chronic rhinitis/ear fullness - Testing today showed positives to: grass, weeds, trees, mold, dust mites, cat, dog, horse, and tobacco - Start Flonase two sprays per nostril daily. - Nasal saline rinses daily before Flonase use. - Try weaning off of the Afrin since this can cause worsening congestion over time (rebound congestion). - Your ear fullness might be related to your inflamed nose. Hopefully controlling the nasal inflammation will help.  - We can do allergy shots in the future if this is not helping.   2. Moderate persistent asthma, uncomplicated - Start Symbicort 160/4.5 two puffs in the morning and two puffs at night.  - Use a spacer each time.  - Use albuterol four puffs every 4-6 hours as needed.   3. Hives/bloating with red meats - We will get lab work to confirm the testing.  - We will call you in one week with the results.  - Take a food diary and we can do targeted skin testing at the next visit for other foods.  4. Return to clinic in one month.  It was a pleasure to meet you today!

## 2016-08-07 NOTE — Progress Notes (Signed)
NEW PATIENT  Date of Service/Encounter:  08/07/16   Assessment:   Moderate persistent asthma, uncomplicated - Plan: Spirometry with Graph  Chronic rhinitis - Testing (08/07/16) positive for weeds, trees, molds, dust mites, horse, tobacco leaf, grass, cat, dog - Plan: Allergy Test, Interdermal Allergy Test, Alpha-Gal Panel  Ear fullness, bilateral - Plan: Allergy Test, Interdermal Allergy Test, Alpha-Gal Panel  Allergic urticaria  Multiple drug allergies  Adverse food reaction, initial encounter - SPT testing negative to lamb, wheat, milk, pork and equivocal to beed (08/07/16) -- unknown significance  Rhinitis medicamentosa  Gustatory rhinitis   Asthma Reportables:  Severity: : moderate persistent  Risk: medium due to multiple comorbidities Control: not well controlled  Seasonal Influenza Vaccine: yes     Plan/Recommendations:     1. Mixed rhinitis (allergic rhinitis, rhinitis medicamentosa, gustatory rhinitis) with subsequent ear fullness - Testing today showed positives to: grass, weeds, trees, mold, dust mites, cat, dog, horse, tobacco - Start Flonase two sprays per nostril daily. - Nasal saline rinses daily before Flonase use. - She did have some scant fluid behind both of her tympanic membranes today, which could be secondary to her chronic rhinitis disrupting eustachian tube function. - I did bring up allergy shots in the future as means of controlling her rhinitis. We can start with medications at this point first.  - Addressed contributing factors including Afrin use and encouraged cessation.   2. Moderate persistent asthma, uncomplicated - Start Symbicort 160/4.5 two puffs in the morning and two puffs at night.  - Although her spirometry was normal today, she is requiring albuterol throughout the day. She does report that the albuterol helps the shortness of breath. - Hopefully this should improve her shortness of breath and decrease her albuterol use. - Use  a spacer each time.  - Use albuterol four puffs every 4-6 hours as needed.   3. Hives/bloating with red meats - Testing was equivocal to beef but negative to pork and lamb today.  - Testing was also negative to milk and wheat.  - We will get an alpha gal panel. - We will call you in one week with the results.  - The low positive predictive value of food testing was discussed with the patient.  - Because of this, we only performed normally targeted testing today. - I did ask the patient to keep a food diary and we can do targeted skin testing at the next visit for other foods. - I do not feel that the ear fullness is related to her food ingestion, but hopefully the food diary will help separate these problems.  - I did encourage her to use over-the-counter antihistamines as needed to treat hives as they appeared.  - Given her frequency of hives, it did not seem warranted to keep her on suppressive antihistamine dosing.   4. Multiple drug allergies - Given the time constraints today, we did not discuss her drug allergies. - Many of her reactions from review of the chart seems to be more intolerances versus IgE mediated reactions. - This could be secondary to her underlying psychosocial stressors and psychological morbidities.    5. Return to clinic in one month.   Subjective:   [name] is a 55 y.o. female presenting today for evaluation of  Chief Complaint  Patient presents with  . Nasal Congestion  . Ear Pain    patient stated that her ears feel like that she needs to pop them all the time  . Headache  .  [name] has a history of the following: Patient Active Problem List   Diagnosis Date Noted  . Chronic rhinitis 08/07/2016  . Moderate persistent asthma 08/07/2016  . Allergic urticaria 08/07/2016  . Adverse food reaction 08/07/2016  . Multiple drug allergies 08/07/2016  . History of colonic polyps   . 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 Acute bronchitis 02/03/2012  . Nausea & vomiting 01/03/2012  . Abdominal pain 12/02/2011  . Chronic diarrhea 12/02/2011  . IBS (irritable bowel syndrome) 12/02/2011  . Weight loss 12/02/2011  . Dysphagia 12/02/2011  . ANXIETY 05/12/2007  . DRUG DEPENDENCE 05/12/2007  . DEPRESSION 05/12/2007  . ALLERGIC RHINITIS 05/12/2007  . ASTHMA 05/12/2007  . COPD 05/12/2007  . GERD 05/12/2007  . LOW BACK PAIN 05/12/2007    History obtained from: chart review and EMR.  [name] was referred by [name] Bogus, MD.     [name] is a 56 y.o. female presenting for evaluation of nasal congestion and urticaria. 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  Gala Walker), as well as fibromyalgia in conjunction with anxiety. She reports today that she has had several years of nasal congestion with ear popping and headaches. This is her main concern today.   [name] endorses headaches, watery eyes, and nasal congestion. She describes ear fullness on both sides. She attempts to open up her sinuses on a daily basis by blowing her nose and performing a valsalva maneuver. She also endorses rhinorrhea with certain foods (no particular food - she has not kept a diary). She does not breathe through her nose very well and she feels that this has essentially become her baseline. She is followed by Dr. Benjamine Walker in [place] who wanted an allergy evaluation before looking into any surgical options. She does use Afrin on a daily basis and knows that this is not a great thing to do. She has been doing that for one year. Symptoms are worse in the spring and early fall, but now it is occuring throughout the year. She has tried cetirizine. She does not tolerate decongestants (hives). She has tried benadryl as well as other antihistamines. She has tried [facility] but she does not use it consistently.   [name] was seen by Dr. Shaune Walker 20 years ago and had  mold, pollen, cat, feather allergies (unfortunately her chart has probably now been destroyed). She did receive immunotherapy but she did not continue it for the full time (one month total).   Asthma/Respiratory Symptom History: Donnita was diagnosed with asthma by his PCP.  Rescue medication: asthma (neb and MDI) uses it approximately once per day Controller(s): never Triggers:  animal dander, fumes, pollens and strong odors  Average frequency of daytime symptoms: daily Average frequency of nocturnal symptoms: daily  Average frequency of exercise symptoms: absent Hospitalizations for respiratory symptoms since last visit (self report): 0 ACT or TRACK score: 13 ED or Urgent Care visits for respiratory symptoms since last visit (self report): 0 Oral/systemic corticosteroids for respiratory symptoms since last visit:0  Izabellah reports that she has urticaria which is triggered by the cold. She treats this as needed with OTC antihistamines. She also reports that red meats (beef, pork, and lamb) seem to cause the urticaria as well. Oddly, she also reports that her develops ear fullness and clogged ears when she eats these foods. Occasionally she will have delayed abdominal pain and flatulence as well. Wheat and  dairy products can cause similar reactions. She is not able to pinpoint any other foods at this point.   [name] has a complicated past medical history, as highlighted above, including psychological diagnoses. She does have several medication allergies which we did not discuss today. Vaccinations are up to date.    Past Medical History: Patient Active Problem List   Diagnosis Date Noted  . Chronic rhinitis 08/07/2016  . Moderate persistent asthma 08/07/2016  . Allergic urticaria 08/07/2016  . Adverse food reaction 08/07/2016  . Multiple drug allergies 08/07/2016  . History of colonic polyps   . 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 Carcinoid, of appendix ([place]) 01/12/2013  . Acute  bronchitis 02/03/2012  . Nausea & vomiting 01/03/2012  . Abdominal pain 12/02/2011  . Chronic diarrhea 12/02/2011  . IBS (irritable bowel syndrome) 12/02/2011  . Weight loss 12/02/2011  . Dysphagia 12/02/2011  . ANXIETY 05/12/2007  . DRUG DEPENDENCE 05/12/2007  . DEPRESSION 05/12/2007  . ALLERGIC RHINITIS 05/12/2007  . ASTHMA 05/12/2007  . COPD 05/12/2007  . GERD 05/12/2007  . LOW BACK PAIN 05/12/2007    Medication List:    Medication List       Accurate as of 08/07/16 12:18 PM. Always use your most recent med list.          acetaminophen 500 MG tablet Commonly known as:  TYLENOL Take 1,000 mg by mouth every 6 (six) hours as needed for pain.   albuterol 108 (90 Base) MCG/ACT inhaler Commonly known as:  PROVENTIL HFA;VENTOLIN HFA Inhale 1-2 puffs into the lungs every 6 (six) hours as needed for wheezing.   albuterol (2.5 MG/3ML) 0.083% nebulizer solution Commonly known as:  PROVENTIL Take 2.5 mg by nebulization every 6 (six) hours as needed. Shortness of breath.   ALPRAZolam 1 MG tablet Commonly known as:  XANAX Take 1 mg by mouth daily as needed. For anxiety and sleep   amphetamine-dextroamphetamine 20 MG tablet Commonly known as:  ADDERALL Take 10-20 mg by mouth daily. For attention   budesonide-formoterol 160-4.5 MCG/ACT inhaler Commonly known as:  SYMBICORT Inhale 2 puffs into the lungs 2 (two) times daily.   cetirizine 10 MG tablet Commonly known as:  ZYRTEC Take 10 mg by mouth every morning.   cycloSPORINE 0.05 % ophthalmic emulsion Commonly known as:  RESTASIS Place 1 drop into both eyes 2 (two) times daily as needed. For dry eyes   estradiol 0.1 MG/24HR patch Commonly known as:  VIVELLE-DOT Place 2 patches onto the skin every 3 (three) days.   FLUoxetine 40 MG capsule Commonly known as:  PROZAC Take 40 mg by mouth every morning.   fluticasone 50 MCG/ACT nasal spray Commonly known as:  FLONASE Place 2 sprays into both nostrils daily.     fluticasone 50 MCG/ACT nasal spray Commonly known as:  FLONASE Place 2 sprays into the nose daily.   furosemide 40 MG tablet Commonly known as:  LASIX Take 40 mg by mouth daily.   levofloxacin 500 MG tablet Commonly known as:  LEVAQUIN Take 500 mg by mouth daily.   omeprazole 20 MG capsule Commonly known as:  PRILOSEC Take 1 capsule (20 mg total) by mouth daily.   Oxycodone HCl 10 MG Tabs Take 10 mg by mouth every 4 (four) hours as needed (for pain).   polyethylene glycol-electrolytes 420 g solution Commonly known as:  NuLYTELY/GoLYTELY Take 4,000 mLs by mouth once.   potassium chloride SA 20 MEQ tablet Commonly known as:  K-DUR,KLOR-CON Take 20 mEq by mouth daily.  pramipexole 0.5 MG tablet Commonly known as:  MIRAPEX Take 0.5 mg by mouth at bedtime.       Birth History: non-contributory  Developmental History: Twylia has met all milestones on time.   Past Surgical History: Past Surgical History:  Procedure Laterality Date  . ABDOMINAL HYSTERECTOMY    . ABDOMINAL SURGERY    . APPENDECTOMY  01/05/2012   with incidental finding of carcinoid  . CARDIAC CATHETERIZATION     no stents needed  . CARDIAC SURGERY    . CHOLECYSTECTOMY    . COLON RESECTION  01/30/2012   Procedure: HAND ASSISTED LAPAROSCOPIC COLON RESECTION;  Surgeon: Jamesetta So, MD;  Location: [facility];  Service: General;  Laterality: N/A;  . COLON SURGERY    . COLONOSCOPY WITH PROPOFOL N/A 09/29/2013   WUX:[id number] and colonic polyps-removed as described above Status post right hemicolectomy  . COLONOSCOPY WITH PROPOFOL N/A 03/15/2015   Procedure: COLONOSCOPY WITH PROPOFOL;  Surgeon: Daneil Dolin, MD;  Location: [facility];  Service: Endoscopy;  Laterality: N/A;  . CYSTOSTOMY W/ BLADDER DILATION     x 52 [place]  . ESOPHAGEAL DILATION  01/01/2012   RMR: Normal esophagus - status post dilation with 60 F Maloney dilation, gastric polyps benign path  . ESOPHAGOGASTRODUODENOSCOPY (EGD) WITH PROPOFOL N/A  09/29/2013   [id number] appearing Schatzki's ring-status post dilation as described above. Hiatal hernia.  Marland Kitchen GIVENS CAPSULE STUDY  01/27/2012   normal  . HEMICOLECTOMY    . ileo-colonoscopy  01/01/12   RMR: multiple colonic polyps, largest polyp in proximal sigmoid colon. Tubulovillous adenoma with high grade dysplasia, tubular adenoma  . LAPAROSCOPIC APPENDECTOMY  01/05/2012   Procedure: APPENDECTOMY LAPAROSCOPIC;  Surgeon: Jamesetta So;  Location: [facility];  Service: General;  Laterality: N/A;  . MALONEY DILATION N/A 09/29/2013   Procedure: Venia Minks DILATION;  Surgeon: Daneil Dolin, MD;  Location: [facility];  Service: Endoscopy;  Laterality: N/A;  55  . PARTIAL COLECTOMY  01/30/2012   Procedure: PARTIAL COLECTOMY;  Surgeon: Jamesetta So, MD;  Location: [facility];  Service: General;  Laterality: N/A;  . POLYPECTOMY N/A 09/29/2013   Procedure: POLYPECTOMY;  Surgeon: Daneil Dolin, MD;  Location: [facility];  Service: Endoscopy;  Laterality: N/A;  . TONSILLECTOMY       Family History: Family History  Problem Relation Age of Onset  . Coronary artery disease Mother   . Heart attack Mother   . Pulmonary embolism Mother   . Alcohol abuse Father   . COPD Father   . Lung disease Father   . Asthma Father   . Anesthesia problems Neg Hx   . Hypotension Neg Hx   . Malignant hyperthermia Neg Hx   . Pseudochol deficiency Neg Hx   . Other Neg Hx   . Allergic rhinitis Neg Hx   . Angioedema Neg Hx   . Atopy Neg Hx   . Eczema Neg Hx   . Immunodeficiency Neg Hx   . Urticaria Neg Hx      Social History: She lives at home with her husband. There is one dog. She recently bought a hamster for her granddaughter who lives in her home. There are cats and dogs at her daughter's house. Her husband does smoke, but she tries to push him outside. Beronica did smoke (10 years at <1 ppd). She now vapes. She is disabled due to her multiple comorbidities. She was a paramedic prior to becoming disabled. At home, she  reports that she sleeps in  their basement which is finished because she prefers the cooler environment. There is no visible mold down there and they have had no flooding.    Review of Systems: a 14-point review of systems is pertinent for what is mentioned in HPI.  Otherwise, all other systems were negative. Constitutional: negative other than that listed in the HPI Eyes: negative other than that listed in the HPI Ears, nose, mouth, throat, and face: negative other than that listed in the HPI Respiratory: negative other than that listed in the HPI Cardiovascular: negative other than that listed in the HPI Gastrointestinal: negative other than that listed in the HPI Genitourinary: negative other than that listed in the HPI Integument: negative other than that listed in the HPI Hematologic: negative other than that listed in the HPI Musculoskeletal: negative other than that listed in the HPI Neurological: negative other than that listed in the HPI Allergy/Immunologic: negative other than that listed in the HPI    Objective:   Blood pressure 125/70, pulse 75, temperature 98 F (36.7 C), temperature source Oral, resp. rate 16, height 5' 0.83" (1.545 m), weight 162 lb (73.5 kg), last menstrual period 12/29/1993. Body mass index is 30.78 kg/m.   Physical Exam:  General: Alert, interactive, in no acute distress. Very talkative and personable female.  HEENT: TMs pearly gray with some fluid present bilaterally, turbinates edematous and pale with clear discharge, post-pharynx erythematous. Neck: Supple without lymphadenopathy. Lungs: Clear to auscultation without wheezing, rhonchi or rales. No wheezing or crackles noted.  CV: Normal S1, S2 without murmurs. Capillary refill within normal limits.  Abdomen: Nondistended, nontender. Skin:Warm and dry, without lesions or rashes. Extremities: No clubbing, cyanosis or edema. Neuro: Grossly intact. No focal deficits noted.  Diagnostic studies:    Spirometry: results normal (FEV1: 2.4/103%, FVC: 2.87/102%, FEV1/FVC: 83%).    Spirometry consistent with normal pattern   Allergy Studies:  Positive for weeds, trees, molds, dust mites, horse, and tobacco on percutaneous testing. Positive for grass mix, mold mix 3, cat, and dog on intradermal testing. Selected food testing negative for milk, wheat, lamb, pork with adequate controls. Beef testing was equivocal.   Salvatore Marvel, MD [facility]

## 2016-08-13 DIAGNOSIS — Z124 Encounter for screening for malignant neoplasm of cervix: Secondary | ICD-10-CM | POA: Diagnosis not present

## 2016-08-13 DIAGNOSIS — Z6829 Body mass index (BMI) 29.0-29.9, adult: Secondary | ICD-10-CM | POA: Diagnosis not present

## 2016-08-13 DIAGNOSIS — Z01419 Encounter for gynecological examination (general) (routine) without abnormal findings: Secondary | ICD-10-CM | POA: Diagnosis not present

## 2016-10-02 ENCOUNTER — Encounter (HOSPITAL_COMMUNITY): Payer: Medicare Other | Attending: Hematology & Oncology | Admitting: Hematology & Oncology

## 2016-10-02 ENCOUNTER — Encounter (HOSPITAL_COMMUNITY): Payer: Medicare Other

## 2016-10-02 ENCOUNTER — Other Ambulatory Visit (HOSPITAL_COMMUNITY): Payer: Medicare Other

## 2016-10-02 ENCOUNTER — Encounter (HOSPITAL_COMMUNITY): Payer: Self-pay | Admitting: Hematology & Oncology

## 2016-10-02 VITALS — BP 144/90 | HR 87 | Temp 98.5°F | Resp 18 | Wt 162.9 lb

## 2016-10-02 DIAGNOSIS — M542 Cervicalgia: Secondary | ICD-10-CM | POA: Diagnosis not present

## 2016-10-02 DIAGNOSIS — D369 Benign neoplasm, unspecified site: Secondary | ICD-10-CM | POA: Diagnosis not present

## 2016-10-02 DIAGNOSIS — D3A020 Benign carcinoid tumor of the appendix: Secondary | ICD-10-CM

## 2016-10-02 DIAGNOSIS — Z85020 Personal history of malignant carcinoid tumor of stomach: Secondary | ICD-10-CM

## 2016-10-02 LAB — COMPREHENSIVE METABOLIC PANEL
ALT: 16 U/L (ref 14–54)
AST: 16 U/L (ref 15–41)
Albumin: 3.9 g/dL (ref 3.5–5.0)
Alkaline Phosphatase: 62 U/L (ref 38–126)
Anion gap: 5 (ref 5–15)
BUN: 11 mg/dL (ref 6–20)
CALCIUM: 8.7 mg/dL — AB (ref 8.9–10.3)
CHLORIDE: 102 mmol/L (ref 101–111)
CO2: 29 mmol/L (ref 22–32)
CREATININE: 0.96 mg/dL (ref 0.44–1.00)
GFR calc Af Amer: >60 mL/min (ref >60)
GFR calc non Af Amer: >60 mL/min (ref >60)
Glucose, Bld: 101 mg/dL — ABNORMAL HIGH (ref 65–99)
Potassium: 3.7 mmol/L (ref 3.5–5.1)
Sodium: 136 mmol/L (ref 135–145)
Total Bilirubin: 0.2 mg/dL — ABNORMAL LOW (ref 0.3–1.2)
Total Protein: 7 g/dL (ref 6.5–8.1)

## 2016-10-02 LAB — CBC WITH DIFFERENTIAL/PLATELET
Basophils Absolute: 0.1 10*3/uL (ref 0.0–0.1)
Basophils Relative: 1 %
Eosinophils Absolute: 0.3 10*3/uL (ref 0.0–0.7)
Eosinophils Relative: 4 %
HCT: 38.8 % (ref 36.0–46.0)
Hemoglobin: 13.3 g/dL (ref 12.0–15.0)
LYMPHS PCT: 30 %
Lymphs Abs: 2.2 10*3/uL (ref 0.7–4.0)
MCH: 30.2 pg (ref 26.0–34.0)
MCHC: 34.3 g/dL (ref 30.0–36.0)
MCV: 88.2 fL (ref 78.0–100.0)
MONO ABS: 0.5 10*3/uL (ref 0.1–1.0)
MONOS PCT: 7 %
Neutro Abs: 4.3 10*3/uL (ref 1.7–7.7)
Neutrophils Relative %: 58 %
Platelets: 326 10*3/uL (ref 150–400)
RBC: 4.4 MIL/uL (ref 3.87–5.11)
RDW: 12.6 % (ref 11.5–15.5)
WBC: 7.4 10*3/uL (ref 4.0–10.5)

## 2016-10-02 LAB — TSH: TSH: 1.368 u[IU]/mL (ref 0.350–4.500)

## 2016-10-02 LAB — T4, FREE: Free T4: 0.85 ng/dL (ref 0.61–1.12)

## 2016-10-02 NOTE — Progress Notes (Signed)
[name] Bogus, MD [address] [place] [address]  Tubulovillous adenoma - Plan: estradiol (ESTRACE) 2 MG tablet, CBC with Differential, Comprehensive metabolic panel, Serotonin serum, TSH, T4, free, Chromogranin A, 5 HIAA, quantitative, urine, 24 hour  Carcinoid, of appendix - Plan: CT Chest W Contrast, CT Abdomen Pelvis W Contrast  CURRENT THERAPY: Surveillance per NCCN guidelines  INTERVAL HISTORY: [name] 55 y.o. 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 Reoperation for her lymph node dissection revealed all nodes to be absolutely negative on 01/30/2012.   Chart is reviewed since her last visit in 10/2014.   Patient reports fatigue today. She also complains of neck pain and reports that she experiences discomfort when it is touched. She says even laying on a pillow at night is uncomfortable due to her neck pain. [name] has had her esophagus stretched, but says this discomfort is different from anything she has had before. She also experiences dysphagia. These new symptoms began a couple of weeks ago.She had an MRI on her neck but it was many years ago. Patient does not know if she has any family history of thyroid disease because her parents never went to the doctor.  [name] reports blurred vision, acne on her face, and brittle nails. She says her appetite is poor on some days, but on other days it is normal. The last few days she has been eating well. Patient also experiences neuropathy in upper and lower extremities. She states that she sometimes she feels a cooling sensation down her legs, as if someone put bengay cream on her. She also reports feeling "electricity going up her neck."  [name]'s PCP is Dr. Luan Pulling.   Patient has had a mammogram recently and a pap smear. She is also up to date on her colonoscopy. No  flushing or diarrhea. She continues to follow with GI. Last colonoscopy in 02/2015.   Past Medical History:  Diagnosis Date  . Alopecia, unspecified   . Anxiety   . Arthritis   . Bronchitis   . Cancer [facility])    carcinoid tumor of appendix  . Carcinoid tumor of appendix   . 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 Reoperation for her lymph node dissection revealed all nodes to be absolutely negative.   . Cervicalgia   . COPD (chronic obstructive pulmonary disease) ([place])   . Depression with anxiety   . Dysphagia   . Fibromyalgia   . GERD (gastroesophageal reflux disease)   . IBS (irritable bowel syndrome)   . Interstitial cystitis   . Kidney stones   . Migraine   . PONV (postoperative nausea and vomiting)   . PTSD (post-traumatic stress disorder)   . Renal failure   . RLS (restless legs syndrome)   . Shortness of breath   . Tobacco use disorder   . UTI (lower urinary tract infection)     has ANXIETY; DRUG DEPENDENCE; DEPRESSION; ALLERGIC RHINITIS; ASTHMA; COPD; GERD; LOW BACK PAIN; Abdominal pain; Chronic diarrhea; IBS (irritable bowel syndrome); Weight loss; Dysphagia; Nausea & vomiting; Acute bronchitis; Carcinoid, of appendix; Tubulovillous adenoma; History of colonic polyps; Chronic rhinitis; Moderate persistent asthma; Allergic urticaria; Adverse food reaction; and Multiple drug allergies on her problem list.  is allergic to ativan [lorazepam]; clarithromycin; ibuprofen; pseudoephedrine; reglan [metoclopramide]; stadol [butorphanol tartrate]; sulfa antibiotics; toradol [ketorolac tromethamine]; factive [gemifloxacin mesylate]; other; prochlorperazine edisylate; promethazine hcl; tofranil-pm; and trimethobenzamide hcl.  [name] had no medications administered during this visit.  Past Surgical History:  Procedure Laterality Date  .  ABDOMINAL HYSTERECTOMY    . ABDOMINAL SURGERY    . APPENDECTOMY  01/05/2012   with incidental finding of carcinoid  . CARDIAC CATHETERIZATION     no stents needed  . CARDIAC SURGERY    . CHOLECYSTECTOMY    . COLON RESECTION  01/30/2012   Procedure: HAND ASSISTED LAPAROSCOPIC COLON RESECTION;  Surgeon: Jamesetta So, MD;  Location: [facility];  Service: General;  Laterality: N/A;  . COLON SURGERY    . COLONOSCOPY WITH PROPOFOL N/A 09/29/2013   [id number] and colonic polyps-removed as described above Status post right hemicolectomy  . COLONOSCOPY WITH PROPOFOL N/A 03/15/2015   Procedure: COLONOSCOPY WITH PROPOFOL;  Surgeon: Daneil Dolin, MD;  Location: [facility];  Service: Endoscopy;  Laterality: N/A;  . CYSTOSTOMY W/ BLADDER DILATION     x [address] [place]  . ESOPHAGEAL DILATION  01/01/2012   RMR: Normal esophagus - status post dilation with 31 F Maloney dilation, gastric polyps benign path  . ESOPHAGOGASTRODUODENOSCOPY (EGD) WITH PROPOFOL N/A 09/29/2013   [id number] appearing Schatzki's ring-status post dilation as described above. Hiatal hernia.  [name] CAPSULE STUDY  01/27/2012   normal  . HEMICOLECTOMY    . ileo-colonoscopy  01/01/12   RMR: multiple colonic polyps, largest polyp in proximal sigmoid colon. Tubulovillous adenoma with high grade dysplasia, tubular adenoma  . LAPAROSCOPIC APPENDECTOMY  01/05/2012   Procedure: APPENDECTOMY LAPAROSCOPIC;  Surgeon: Jamesetta So;  Location: [facility];  Service: General;  Laterality: N/A;  . MALONEY DILATION N/A 09/29/2013   Procedure: Venia Minks DILATION;  Surgeon: Daneil Dolin, MD;  Location: [facility];  Service: Endoscopy;  Laterality: N/A;  55  . PARTIAL COLECTOMY  01/30/2012   Procedure: PARTIAL COLECTOMY;  Surgeon: Jamesetta So, MD;  Location: [facility];  Service: General;  Laterality: N/A;  . POLYPECTOMY N/A 09/29/2013   Procedure: POLYPECTOMY;  Surgeon: Daneil Dolin, MD;  Location: [facility];  Service: Endoscopy;  Laterality: N/A;  . TONSILLECTOMY       Positive for poor appetite, difficulty swallowing, neck pain, blurred vision, brittle nails,  acne, neuropathy in upper and lower extremities. Denies any headaches, dizziness, double vision, fevers, chills, night sweats, nausea, vomiting, diarrhea, constipation, chest pain, heart palpitations, shortness of breath, blood in stool, black tarry stool, urinary pain, urinary burning, urinary frequency, hematuria.   PHYSICAL EXAMINATION  ECOG PERFORMANCE STATUS: 0 - Asymptomatic  Vitals:   10/02/16 1309  BP: (!) 144/90  Pulse: 87  Resp: 18  Temp: 98.5 F (36.9 C)    GENERAL:alert, no distress, well nourished, well developed, comfortable, cooperative and smiling SKIN: skin color, texture, turgor are normal, no rashes or significant lesions HEAD: Normocephalic, No masses, lesions, tenderness or abnormalities EYES: normal, PERRLA, EOMI, Conjunctiva are pink and non-injected EARS: External ears normal OROPHARYNX:mucous membranes are moist  NECK: supple, no adenopathy, thyroid normal size, non-tender, without nodularity, no stridor, trachea midline complains of discomfort with palpation, no thyromegaly LYMPH:  no palpable lymphadenopathy, no hepatosplenomegaly BREAST:not examined LUNGS: clear to auscultation and percussion HEART: regular rate & rhythm, no murmurs, no gallops, S1 normal and S2 normal ABDOMEN:abdomen soft, non-tender, normal bowel sounds, no masses or organomegaly and no hepatosplenomegaly BACK: Back symmetric, no  curvature., No CVA tenderness, Range of motion is normal, negative straight leg raising, tenderness to palpation of low back EXTREMITIES:less then 2 second capillary refill, no joint deformities, effusion, or inflammation, no edema, no skin discoloration, no clubbing, no cyanosis  NEURO: alert & oriented x 3 with fluent speech, no focal motor/sensory deficits, gait normal   LABORATORY DATA: CBC    Component Value Date/Time   WBC 7.4 10/02/2016 1424   RBC 4.40  10/02/2016 1424   HGB 13.3 10/02/2016 1424   HCT 38.8 10/02/2016 1424   HCT 40 11/12/2011 0928   PLT 326 10/02/2016 1424   MCV 88.2 10/02/2016 1424   MCH 30.2 10/02/2016 1424   MCHC 34.3 10/02/2016 1424   RDW 12.6 10/02/2016 1424   LYMPHSABS 2.2 10/02/2016 1424   MONOABS 0.5 10/02/2016 1424   EOSABS 0.3 10/02/2016 1424   BASOSABS 0.1 10/02/2016 1424      Chemistry      Component Value Date/Time   NA 136 10/02/2016 1424   NA 139 11/12/2011 0928   K 3.7 10/02/2016 1424   K 4.6 11/12/2011 0928   CL 102 10/02/2016 1424   CL 105 11/12/2011 0928   CO2 29 10/02/2016 1424   CO2 26 11/12/2011 0928   BUN 11 10/02/2016 1424   BUN 10 11/12/2011 0928   CREATININE 0.96 10/02/2016 1424   CREATININE 0.81 11/12/2011 0928   GLU 84 11/12/2011 0928      Component Value Date/Time   CALCIUM 8.7 (L) 10/02/2016 1424   CALCIUM 9.4 11/12/2011 0928   ALKPHOS 62 10/02/2016 1424   ALKPHOS 56 12/04/2011 0926   AST 16 10/02/2016 1424   AST 14 12/04/2011 0926   ALT 16 10/02/2016 1424   BILITOT 0.2 (L) 10/02/2016 1424   BILITOT 0.4 12/04/2011 0926     Results for Walker, Sabree L (MRN [id number]) as of 10/05/2016 13:36  Ref. Range 10/19/2013 15:38 01/18/2014 11:40 11/01/2014 15:00 05/11/2015 12:47 10/02/2016 14:24  Chromogranin A Latest Ref Range: 0 - 5 nmol/L 19.6 (H) 8.0 40 (H) 2 8 (H)   Results for Walker, DENIS SKALKA (MRN [id number]) as of 10/05/2016 13:36  Ref. Range 06/30/2012 13:35 04/05/2013 15:23 10/19/2013 15:38 11/01/2014 15:00 05/11/2015 12:47  Serotonin, Serum Latest Ref Range: 0 - 420 ng/mL 18 (L) <10 (L) 18 (L) 22 (L) 16     ASSESSMENT:  1. 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 Reoperation for her lymph node dissection revealed all nodes to be absolutely negative on 01/30/2012. 2. Right upper lobe lesion resolved on CT from 01/19/2014 3. Poor compliance  4. Long list of nonspecific  complaints including: pain all over, "spots" on legs, fatigue, "bone pain", abdominal pain, etc. chronic 5. Neck pain   Patient Active Problem List   Diagnosis Date Noted  . Carcinoid, of appendix 01/12/2013    Priority: High  . Abdominal pain 12/02/2011    Priority: Medium  . Chronic rhinitis 08/07/2016  . Moderate persistent asthma 08/07/2016  . Allergic urticaria 08/07/2016  . Adverse food reaction 08/07/2016  . Multiple drug allergies 08/07/2016  . History of colonic polyps   . 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 Acute bronchitis 02/03/2012  . Nausea & vomiting 01/03/2012  . Chronic diarrhea 12/02/2011  . IBS (irritable bowel syndrome) 12/02/2011  . Weight loss 12/02/2011  . Dysphagia 12/02/2011  . ANXIETY 05/12/2007  . DRUG DEPENDENCE 05/12/2007  . DEPRESSION  05/12/2007  . ALLERGIC RHINITIS 05/12/2007  . ASTHMA 05/12/2007  . COPD 05/12/2007  . GERD 05/12/2007  . LOW BACK PAIN 05/12/2007     PLAN:  1. I personally reviewed and went over laboratory results with the patient.  The results are noted within this dictation. I have ordered a TSH/FT4, she will be notified of the results. 2. Last Staging scans: CT abd/pelvis with contrast for surveillance for Carcinoid tumor in Jan 2015 for annual surveillance, these have been reordered 3. Labs today, 6 months, and 12 months: CBC diff, CMET, Serum Serotonin, Chromogranin A 4. 24 hour urine collection for 5HIAA  5. Follow-up with primary care provider for back pain 6. Smoking cessation education provided 7. Return in 6 months for follow-up   THERAPY PLAN:  NCCN guidelines recommends the following surveillance 3-12 months postresection:   A. H+P  B. Consider 24 hour urine collection for 5-HIAA  C. Consider Chromogranin A  D. Consider abdominal/pelvic multiphasic CT or MRI. NCCN guidelines recommends the following surveillance greater than 1 year postresection for up to 10 years:  A. H+P every 6-12 months  B. Consider 24  hour urine collection for 5-HIAA every 6-12 months  C. Consider chromogranin A every 6-12 months.  D. Consider multiphasic CT or MRI every 6-12 months.   Orders Placed This Encounter  Procedures  . CT Chest W Contrast    Standing Status:   Future    Standing Expiration Date:   10/02/2017    Order Specific Question:   If indicated for the ordered procedure, I authorize the administration of contrast media per Radiology protocol    Answer:   Yes    Order Specific Question:   Reason for Exam (SYMPTOM  OR DIAGNOSIS REQUIRED)    Answer:   appendiceal carcinoid    Order Specific Question:   Is patient pregnant?    Answer:   No    Order Specific Question:   Preferred imaging location?    Answer:   [facility]  . CT Abdomen Pelvis W Contrast    Standing Status:   Future    Standing Expiration Date:   10/02/2017    Order Specific Question:   If indicated for the ordered procedure, I authorize the administration of contrast media per Radiology protocol    Answer:   Yes    Order Specific Question:   Reason for Exam (SYMPTOM  OR DIAGNOSIS REQUIRED)    Answer:   appendiceal carcinoid, restaging    Order Specific Question:   Is patient pregnant?    Answer:   No    Order Specific Question:   Preferred imaging location?    Answer:   [facility]  . CBC with Differential    Standing Status:   Future    Number of Occurrences:   1    Standing Expiration Date:   10/02/2017  . Comprehensive metabolic panel    Standing Status:   Future    Number of Occurrences:   1    Standing Expiration Date:   10/02/2017  . Serotonin serum    Standing Status:   Future    Number of Occurrences:   1    Standing Expiration Date:   10/02/2017  . TSH    Standing Status:   Future    Number of Occurrences:   1    Standing Expiration Date:   10/02/2017  . T4, free  . Chromogranin A  . 5 HIAA, quantitative, urine, 24  hour    Standing Status:   Future    Standing Expiration Date:   10/02/2017    All  questions were answered. The patient knows to call the clinic with any problems, questions or concerns. We can certainly see the patient much sooner if necessary.  Penland,Shannon Kristen 10/05/2016

## 2016-10-02 NOTE — Patient Instructions (Addendum)
[place] at [facility] Discharge Instructions  RECOMMENDATIONS MADE BY THE CONSULTANT AND ANY TEST RESULTS WILL BE SENT TO YOUR REFERRING PHYSICIAN.  Exam by Dr.  Muse today. We are ordering a CT scan of your chest, abdomen, and pelvis.  Please see Amy for appointment.   Please go to the lab as you leave today for labwork.   Return to [place] in 6 months for follow up.     Thank you for choosing [place] at [facility] to provide your oncology and hematology care.  To afford each patient quality time with our provider, please arrive at least 15 minutes before your scheduled appointment time.   Beginning January 23rd 2017 lab work for the [facility] will be done in the  Main lab at [facility] on 1st floor. If you have a lab appointment with the [facility] please come in thru the  Main Entrance and check in at the main information desk  You need to re-schedule your appointment should you arrive 10 or more minutes late.  We strive to give you quality time with our providers, and arriving late affects you and other patients whose appointments are after yours.  Also, if you no show three or more times for appointments you may be dismissed from the clinic at the providers discretion.     Again, thank you for choosing [facility].  Our hope is that these requests will decrease the amount of time that you wait before being seen by our physicians.       _____________________________________________________________  Should you have questions after your visit to [facility], please contact our office at [phone] between the hours of 8:30 a.m. and 4:30 p.m.  Voicemails left after 4:30 p.m. will not be returned until the following business day.  For prescription refill requests, have your pharmacy contact our office.         Resources For Cancer Patients and their Caregivers ? American Cancer Society: Can assist  with transportation, wigs, general needs, runs Look Good Feel Better.        316 696 0271 ? Cancer Care: Provides financial assistance, online support groups, medication/co-pay assistance.  1-800-813-HOPE (216) 561-0153) ? [employer] Assists [place] cancer patients and their families through emotional , educational and financial support.  830-648-4887 ? [place] DSS Where to apply for food stamps, Medicaid and utility assistance. 209-403-7050 ? [employer]: Transportation to medical appointments. 551-512-9369 ? Social Security Administration: May apply for disability if have a Stage IV cancer. (765)559-9312 (469)287-0713 ? [employer], Disability and Transit Services: Assists with nutrition, care and transit needs. [place] Support Programs: @10RELATIVEDAYS @ > Cancer Support Group  2nd Tuesday of the month 1pm-2pm, Journey Room  > Creative Journey  3rd Tuesday of the month 1130am-1pm, Journey Room  > Look Good Feel Better  1st Wednesday of the month 10am-12 noon, Journey Room (Call [facility] to register [phone])

## 2016-10-03 LAB — CHROMOGRANIN A: CHROMOGRANIN A: 8 nmol/L — AB (ref 0–5)

## 2016-10-05 ENCOUNTER — Encounter (HOSPITAL_COMMUNITY): Payer: Self-pay | Admitting: Hematology & Oncology

## 2016-10-06 ENCOUNTER — Telehealth (HOSPITAL_COMMUNITY): Payer: Self-pay | Admitting: *Deleted

## 2016-10-06 NOTE — Telephone Encounter (Signed)
Pt aware of results and states that she will bring in the 24 hour urine in tomorrow morning.

## 2016-10-06 NOTE — Telephone Encounter (Signed)
-----   Message from Patrici Ranks, MD sent at 10/05/2016  1:37 PM EDT -----  Thyroid studies are all normal.  Make sure she does her 24 hour urine  Dr.P

## 2016-10-07 ENCOUNTER — Other Ambulatory Visit (HOSPITAL_COMMUNITY): Payer: Self-pay | Admitting: *Deleted

## 2016-10-07 ENCOUNTER — Telehealth (HOSPITAL_COMMUNITY): Payer: Self-pay | Admitting: *Deleted

## 2016-10-07 DIAGNOSIS — D369 Benign neoplasm, unspecified site: Secondary | ICD-10-CM | POA: Diagnosis not present

## 2016-10-08 ENCOUNTER — Emergency Department (HOSPITAL_COMMUNITY)
Admission: EM | Admit: 2016-10-08 | Discharge: 2016-10-08 | Disposition: A | Discharge status: Home / Self Care | Payer: Medicare Other | Attending: Emergency Medicine | Admitting: Emergency Medicine

## 2016-10-08 ENCOUNTER — Other Ambulatory Visit: Payer: Self-pay

## 2016-10-08 ENCOUNTER — Emergency Department (HOSPITAL_COMMUNITY): Payer: Medicare Other

## 2016-10-08 ENCOUNTER — Encounter (HOSPITAL_COMMUNITY): Payer: Self-pay | Admitting: Emergency Medicine

## 2016-10-08 DIAGNOSIS — J45909 Unspecified asthma, uncomplicated: Secondary | ICD-10-CM | POA: Insufficient documentation

## 2016-10-08 DIAGNOSIS — J449 Chronic obstructive pulmonary disease, unspecified: Secondary | ICD-10-CM | POA: Insufficient documentation

## 2016-10-08 DIAGNOSIS — M5412 Radiculopathy, cervical region: Secondary | ICD-10-CM

## 2016-10-08 DIAGNOSIS — F17210 Nicotine dependence, cigarettes, uncomplicated: Secondary | ICD-10-CM | POA: Diagnosis not present

## 2016-10-08 DIAGNOSIS — Z79899 Other long term (current) drug therapy: Secondary | ICD-10-CM | POA: Diagnosis not present

## 2016-10-08 DIAGNOSIS — M542 Cervicalgia: Secondary | ICD-10-CM | POA: Diagnosis present

## 2016-10-08 LAB — SEROTONIN SERUM: SEROTONIN, SERUM: 4 ng/mL (ref 0–420)

## 2016-10-08 MED ORDER — ONDANSETRON 8 MG PO TBDP
8.0000 mg | ORAL_TABLET | Freq: Once | ORAL | Status: AC
  Administered 2016-10-08: 8 mg via ORAL
  Filled 2016-10-08: qty 1

## 2016-10-08 MED ORDER — DEXAMETHASONE SODIUM PHOSPHATE 10 MG/ML IJ SOLN
8.0000 mg | Freq: Once | INTRAMUSCULAR | Status: AC
  Administered 2016-10-08: 8 mg via INTRAMUSCULAR
  Filled 2016-10-08: qty 1

## 2016-10-08 MED ORDER — DIPHENHYDRAMINE HCL 12.5 MG/5ML PO ELIX
25.0000 mg | ORAL_SOLUTION | Freq: Once | ORAL | Status: AC
  Administered 2016-10-08: 25 mg via ORAL
  Filled 2016-10-08: qty 10

## 2016-10-08 MED ORDER — HYDROMORPHONE HCL 1 MG/ML IJ SOLN
1.0000 mg | Freq: Once | INTRAMUSCULAR | Status: AC
  Administered 2016-10-08: 1 mg via INTRAMUSCULAR
  Filled 2016-10-08: qty 1

## 2016-10-08 MED ORDER — METHOCARBAMOL 500 MG PO TABS: 1000.0000 mg | ORAL_TABLET | Freq: Four times a day (QID) | ORAL | 0 refills | Status: AC

## 2016-10-08 MED ORDER — METHOCARBAMOL 500 MG PO TABS
500.0000 mg | ORAL_TABLET | Freq: Once | ORAL | Status: AC
  Administered 2016-10-08: 500 mg via ORAL
  Filled 2016-10-08: qty 1
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
## 2016-10-08 NOTE — Telephone Encounter (Signed)
Called patient to let her know her lab results are good per Kirby Crigler PA-C

## 2016-10-08 NOTE — Discharge Instructions (Signed)
Continue taking your oxycodone and add the muscle relaxer as prescribed.  Do not drive within 4 hours of taking robaxin as this can make you drowsy.  Avoid lifting,  Bending,  Twisting or any other activity that worsens your pain over the next week.  Apply a heating pad to your neck and shoulder area several times daily for 20 minute increments.  You should get rechecked if your symptoms are not better over the next 5 days, or you develop increased pain or weakness in your arms.

## 2016-10-08 NOTE — Telephone Encounter (Signed)
Unrelated to her cancer care.  She can follow-up with her primary care provider regarding her neck pain.  TK

## 2016-10-08 NOTE — ED Triage Notes (Signed)
Pt c/o neck pain, states it is hard to swallow and pain radiates into shoulder and down arm. Pt is waiting for approval of neck ct. Pt has seen oncologist for the same.

## 2016-10-08 NOTE — Telephone Encounter (Signed)
-----   Message from Baird Cancer, PA-C sent at 10/07/2016  4:41 PM EDT ----- Regarding: RE: lab results Good ----- Message ----- From: Coralie Keens Page, RN Sent: 10/07/2016  12:43 PM To: Baird Cancer, PA-C Subject: FW: lab results                                Please review ----- Message ----- From: Louis Meckel Sent: 10/07/2016   8:22 AM To: Onc Nurse Ap Subject: lab results                                    Patient called and would like to get lab results.  Please call her.  Thanks

## 2016-10-09 NOTE — Telephone Encounter (Signed)
LMOM that the pt should follow up with her PCP regarding her neck pain.

## 2016-10-12 ENCOUNTER — Encounter (HOSPITAL_COMMUNITY): Payer: Self-pay | Admitting: Emergency Medicine

## 2016-10-12 DIAGNOSIS — J449 Chronic obstructive pulmonary disease, unspecified: Secondary | ICD-10-CM | POA: Insufficient documentation

## 2016-10-12 DIAGNOSIS — M5412 Radiculopathy, cervical region: Secondary | ICD-10-CM | POA: Insufficient documentation

## 2016-10-12 DIAGNOSIS — Z79899 Other long term (current) drug therapy: Secondary | ICD-10-CM | POA: Insufficient documentation

## 2016-10-12 DIAGNOSIS — F17210 Nicotine dependence, cigarettes, uncomplicated: Secondary | ICD-10-CM | POA: Diagnosis not present

## 2016-10-12 DIAGNOSIS — M542 Cervicalgia: Secondary | ICD-10-CM | POA: Diagnosis present

## 2016-10-12 NOTE — ED Triage Notes (Signed)
Pt with unresolved neck pain and new elbow pain and numbness to fingers on L. Hand. States she hasn't been able to sleep or eat as a result of the pain. Pt tearful in triage.

## 2016-10-13 ENCOUNTER — Other Ambulatory Visit: Payer: Self-pay

## 2016-10-13 ENCOUNTER — Emergency Department (HOSPITAL_COMMUNITY)
Admission: EM | Admit: 2016-10-13 | Discharge: 2016-10-13 | Disposition: A | Discharge status: Home / Self Care | Payer: Medicare Other | Attending: Emergency Medicine | Admitting: Emergency Medicine

## 2016-10-13 ENCOUNTER — Emergency Department (HOSPITAL_COMMUNITY): Payer: Medicare Other

## 2016-10-13 DIAGNOSIS — M5412 Radiculopathy, cervical region: Secondary | ICD-10-CM

## 2016-10-13 LAB — COMPREHENSIVE METABOLIC PANEL
ALBUMIN: 3.7 g/dL (ref 3.5–5.0)
ALT: 16 U/L (ref 14–54)
ANION GAP: 6 (ref 5–15)
AST: 14 U/L — ABNORMAL LOW (ref 15–41)
Alkaline Phosphatase: 70 U/L (ref 38–126)
BILIRUBIN TOTAL: 0.4 mg/dL (ref 0.3–1.2)
BUN: 13 mg/dL (ref 6–20)
CALCIUM: 8.8 mg/dL — AB (ref 8.9–10.3)
CO2: 32 mmol/L (ref 22–32)
Chloride: 98 mmol/L — ABNORMAL LOW (ref 101–111)
Creatinine, Ser: 0.97 mg/dL (ref 0.44–1.00)
GFR calc non Af Amer: >60 mL/min (ref >60)
GLUCOSE: 95 mg/dL (ref 65–99)
POTASSIUM: 3.6 mmol/L (ref 3.5–5.1)
SODIUM: 136 mmol/L (ref 135–145)
TOTAL PROTEIN: 6.8 g/dL (ref 6.5–8.1)

## 2016-10-13 LAB — CBC WITH DIFFERENTIAL/PLATELET
BASOS PCT: 0 %
Basophils Absolute: 0 10*3/uL (ref 0.0–0.1)
EOS ABS: 0.6 10*3/uL (ref 0.0–0.7)
Eosinophils Relative: 5 %
HCT: 38.4 % (ref 36.0–46.0)
Hemoglobin: 13.1 g/dL (ref 12.0–15.0)
LYMPHS ABS: 4.5 10*3/uL — AB (ref 0.7–4.0)
Lymphocytes Relative: 39 %
MCH: 30.3 pg (ref 26.0–34.0)
MCHC: 34.1 g/dL (ref 30.0–36.0)
MCV: 88.9 fL (ref 78.0–100.0)
MONO ABS: 0.8 10*3/uL (ref 0.1–1.0)
MONOS PCT: 7 %
Neutro Abs: 5.9 10*3/uL (ref 1.7–7.7)
Neutrophils Relative %: 49 %
Platelets: 339 10*3/uL (ref 150–400)
RBC: 4.32 MIL/uL (ref 3.87–5.11)
RDW: 12.6 % (ref 11.5–15.5)
WBC: 11.8 10*3/uL — ABNORMAL HIGH (ref 4.0–10.5)

## 2016-10-13 LAB — TROPONIN I: Troponin I: <0.03 ng/mL (ref <0.03)

## 2016-10-13 LAB — 5 HIAA, QUANTITATIVE, URINE, 24 HOUR
5 HIAA UR: 2.8 mg/L
5-HIAA,QUANT.,24 HR URINE: 3.4 mg/(24.h) (ref 0.0–14.9)
Total Volume: 1200

## 2016-10-13 MED ORDER — ONDANSETRON HCL 4 MG/2ML IJ SOLN
4.0000 mg | Freq: Once | INTRAMUSCULAR | Status: AC
  Administered 2016-10-13: 4 mg via INTRAVENOUS
  Filled 2016-10-13: qty 2

## 2016-10-13 MED ORDER — DIPHENHYDRAMINE HCL 25 MG PO CAPS
ORAL_CAPSULE | ORAL | Status: AC
  Filled 2016-10-13: qty 2

## 2016-10-13 MED ORDER — HYDROMORPHONE HCL 1 MG/ML IJ SOLN
1.0000 mg | Freq: Once | INTRAMUSCULAR | Status: AC
  Administered 2016-10-13: 1 mg via INTRAVENOUS
  Filled 2016-10-13: qty 1

## 2016-10-13 MED ORDER — PREDNISONE 10 MG (21) PO TBPK: 10.0000 mg | ORAL_TABLET | Freq: Every day | ORAL | 0 refills | Status: DC

## 2016-10-13 MED ORDER — DIPHENHYDRAMINE HCL 50 MG/ML IJ SOLN
INTRAMUSCULAR | Status: AC
  Filled 2016-10-13: qty 1

## 2016-10-13 MED ORDER — METHYLPREDNISOLONE SODIUM SUCC 125 MG IJ SOLR
125.0000 mg | Freq: Once | INTRAMUSCULAR | Status: AC
  Administered 2016-10-13: 125 mg via INTRAVENOUS
  Filled 2016-10-13: qty 2

## 2016-10-13 MED ORDER — DIPHENHYDRAMINE HCL 25 MG PO CAPS
50.0000 mg | ORAL_CAPSULE | Freq: Once | ORAL | Status: AC
  Administered 2016-10-13: 50 mg via ORAL

## 2016-10-13 NOTE — ED Provider Notes (Signed)
TIME SEEN: 2:50 AM  CHIEF COMPLAINT: Neck pain, left-sided chest pain, left elbow pain, numbness in the left hand  HPI: Pt is a 55 y.o. female with history of carcinoid tumor of the appendix, anxiety, COPD, fibromyalgia on chronic pain medication who presents to the emergency department with complaints of several days of left-sided neck pain that radiates down into her arm with left hand numbness. Denies any known injury but states pain has been worse with movement and taking of her grandchildren. She was seen in the emergency department on October 11 and had a CT scan which showed degenerative disc disease but no other acute abnormality. Is on oxycodone 10 mg at home for chronic pain which has not been relieving her pain. Has also been taking muscle relaxers and ibuprofen without relief. States she always has urinary incontinence that is unchanged. No bowel incontinence. No urinary retention. No fever. States at times she will have pain in her chest and feels that she cannot swallow. No nausea, diaphoresis or dizziness. No history of cardiac disease. She is not sure if this pain is related to her neck pain. She feels like someone has blown a blood pressure cuff up on her arm and feels like it is heavy.  Also states that she hit her elbow on something several days ago and it is swollen. Is requesting an x-ray of this arm.  ROS: See HPI Constitutional: no fever  Eyes: no drainage  ENT: no runny nose   Cardiovascular:  no chest pain  Resp: no SOB  GI: no vomiting GU: no dysuria Integumentary: no rash  Allergy: no hives  Musculoskeletal: no leg swelling  Neurological: no slurred speech ROS otherwise negative  PAST MEDICAL HISTORY/PAST SURGICAL HISTORY:  Past Medical History:  Diagnosis Date  . Alopecia, unspecified   . Anxiety   . Arthritis   . Bronchitis   . Cancer [facility])    carcinoid tumor of appendix  . Carcinoid tumor of appendix   . 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 Reoperation for her lymph node dissection revealed all nodes to be absolutely negative.   . Cervicalgia   . COPD (chronic obstructive pulmonary disease) ([place])   . Depression with anxiety   . Dysphagia   . Fibromyalgia   . GERD (gastroesophageal reflux disease)   . IBS (irritable bowel syndrome)   . Interstitial cystitis   . Kidney stones   . Migraine   . PONV (postoperative nausea and vomiting)   . PTSD (post-traumatic stress disorder)   . Renal failure   . RLS (restless legs syndrome)   . Shortness of breath   . Tobacco use disorder   . UTI (lower urinary tract infection)     MEDICATIONS:  Prior to Admission medications   Medication Sig Start Date End Date Taking? Authorizing Provider  acetaminophen (TYLENOL) 500 MG tablet Take 1,000 mg by mouth every 6 (six) hours as needed for pain.    Historical Provider, MD  albuterol (PROVENTIL HFA;VENTOLIN HFA) 108 (90 BASE) MCG/ACT inhaler Inhale 1-2 puffs into the lungs every 6 (six) hours as needed for wheezing. 06/14/13   Nat Christen, MD  albuterol (PROVENTIL) (2.5 MG/3ML) 0.083% nebulizer solution Take 2.5 mg by nebulization every 6 (six) hours as needed. Shortness of breath.    Historical Provider, MD  ALPRAZolam Duanne Moron) 1 MG tablet Take 0.125 mg by  mouth daily as needed for anxiety. For anxiety and sleep    Historical Provider, MD  amphetamine-dextroamphetamine (ADDERALL) 20 MG tablet Take 10 mg by mouth daily. For attention 10/31/11   Historical Provider, MD  budesonide-formoterol (SYMBICORT) 160-4.5 MCG/ACT inhaler Inhale 2 puffs into the lungs 2 (two) times daily. 08/07/16   Valentina Shaggy, MD  cetirizine (ZYRTEC) 10 MG tablet Take 10 mg by mouth every morning.     Historical Provider, MD  estradiol (ESTRACE) 2 MG tablet Take 2 mg by mouth daily. 09/08/16   Historical Provider, MD  FLUoxetine (PROZAC) 40 MG  capsule Take 40 mg by mouth every morning.     Historical Provider, MD  fluticasone (FLONASE) 50 MCG/ACT nasal spray Place 2 sprays into both nostrils daily. 08/07/16   Valentina Shaggy, MD  furosemide (LASIX) 40 MG tablet Take 40 mg by mouth daily.     Historical Provider, MD  levofloxacin (LEVAQUIN) 500 MG tablet Take 500 mg by mouth daily.    Historical Provider, MD  methocarbamol (ROBAXIN) 500 MG tablet Take 2 tablets (1,000 mg total) by mouth 4 (four) times daily. 10/08/16 10/18/16  Evalee Jefferson, PA-C  omeprazole (PRILOSEC) 20 MG capsule Take 1 capsule (20 mg total) by mouth daily. 09/15/13   Annitta Needs, NP  Oxycodone HCl 10 MG TABS Take 10 mg by mouth every 4 (four) hours as needed (for pain).    Historical Provider, MD  potassium chloride SA (K-DUR,KLOR-CON) 20 MEQ tablet Take 20 mEq by mouth daily.  08/19/13   Historical Provider, MD  pramipexole (MIRAPEX) 0.5 MG tablet Take 0.25 mg by mouth 2 (two) times daily.     Historical Provider, MD    ALLERGIES:  Allergies  Allergen Reactions  . Ativan [Lorazepam] Other (See Comments)    Hallucinations  . Clarithromycin Itching and Nausea And Vomiting  . Ibuprofen Other (See Comments)    GI bleed  . Pseudoephedrine Hives  . Reglan [Metoclopramide] Other (See Comments)    Bad stomach pain   . Stadol [Butorphanol Tartrate] Nausea And Vomiting  . Sulfa Antibiotics Nausea And Vomiting  . Toradol [Ketorolac Tromethamine] Other (See Comments)    abd pain   . Factive [Gemifloxacin Mesylate] Rash  . Other Rash    All decongestants **can take guafenisin  . Prochlorperazine Edisylate Anxiety  . Promethazine Hcl Anxiety  . Tofranil-Pm Rash  . Trimethobenzamide Hcl Anxiety    SOCIAL HISTORY:  Social History  Substance Use Topics  . Smoking status: Current Every Day Smoker    Packs/day: 0.10    Years: 7.00    Types: E-cigarettes  . Smokeless tobacco: Current User  . Alcohol use No    FAMILY HISTORY: Family History  Problem  Relation Age of Onset  . Coronary artery disease Mother   . Heart attack Mother   . Pulmonary embolism Mother   . Alcohol abuse Father   . COPD Father   . Lung disease Father   . Asthma Father   . Anesthesia problems Neg Hx   . Hypotension Neg Hx   . Malignant hyperthermia Neg Hx   . Pseudochol deficiency Neg Hx   . Other Neg Hx   . Allergic rhinitis Neg Hx   . Angioedema Neg Hx   . Atopy Neg Hx   . Eczema Neg Hx   . Immunodeficiency Neg Hx   . Urticaria Neg Hx     EXAM: BP 161/82 (BP Location: Right Arm)   Pulse 90  Temp 98.3 F (36.8 C) (Oral)   Resp 18   Ht 5\' 2"  (1.575 m)   Wt 160 lb (72.6 kg)   LMP 12/29/1993   SpO2 97%   BMI 29.26 kg/m  CONSTITUTIONAL: Alert and oriented and responds appropriately to questions. Appears uncomfortable, tearful, anxious HEAD: Normocephalic EYES: Conjunctivae clear, PERRL ENT: normal nose; no rhinorrhea; moist mucous membranes NECK: Supple, no meningismus, no LAD, no midline spinal tenderness or step-off or deformity, tender to palpation over the left trapezius muscle with associated spasm  CARD: RRR; S1 and S2 appreciated; no murmurs, no clicks, no rubs, no gallops RESP: Normal chest excursion without splinting or tachypnea; breath sounds clear and equal bilaterally; no wheezes, no rhonchi, no rales, no hypoxia or respiratory distress, speaking full sentences ABD/GI: Normal bowel sounds; non-distended; soft, non-tender, no rebound, no guarding, no peritoneal signs BACK:  The back appears normal and is non-tender to palpation, there is no CVA tenderness EXT: Tender over the left elbow with mild swelling without ecchymosis. No joint effusion. Compartments are soft. Normal ROM in all joints; otherwise extremities are non-tender to palpation; no edema; normal capillary refill; no cyanosis, no calf tenderness or swelling 2+ radial pulses bilaterally    SKIN: Normal color for age and race; warm; no rash NEURO: Moves all extremities equally,  sensation to light touch intact diffusely, cranial nerves II through XII intact, normal grip strength bilaterally and strength 5/5 in bilateral upper extremities, normal gait, 2+ deep tendon reflexes in bilateral upper extremities PSYCH: The patient's mood and manner are appropriate. Grooming and personal hygiene are appropriate.  MEDICAL DECISION MAKING: Patient here with complaints of neck pain. Suspect this is muscle spasm versus cervical radiculopathy. Doubt cervical myelopathy, spinal stenosis, epidural abscess or hematoma, discitis or ostium myelitis, transverse myelitis. I do not feel at this time she needs repeat emergent imaging of her neck.  She is also having some chest pain and difficulty swallowing. This may be musculoskeletal in nature related to her neck pain though will obtain cardiac labs and chest x-ray to ensure this is not her anginal equivalent.  Will treat pain with Dilaudid and give dose of IV Solu-Medrol.  We'll also obtain x-ray of her left elbow given recent injury.  ED PROGRESS: 4:45 AM  Pt's pain has improved with Dilaudid. Her cardiac workup has been unremarkable. Chest x-ray clear. Left elbow x-ray shows no acute injury. I suspect this is cervical radiculopathy. We'll discharge on steroid taper and have her follow-up with her primary care physician to schedule an outpatient MRI. Discussed return precautions. Patient and husband verbalize understanding and are comfortable with this plan. She has chronic pain and has oxycodone at home to take for pain control.   At this time, I do not feel there is any life-threatening condition present. I have reviewed and discussed all results (EKG, imaging, lab, urine as appropriate), exam findings with patient/family. I have reviewed nursing notes and appropriate previous records.  I feel the patient is safe to be discharged home without further emergent workup and can continue workup as an outpatient as needed. Discussed usual and customary  return precautions. Patient/family verbalize understanding and are comfortable with this plan.  Outpatient follow-up has been provided. All questions have been answered.      EKG Interpretation  Date/Time:  Monday October 13 2016 03:07:45 EDT Ventricular Rate:  71 PR Interval:    QRS Duration: 80 QT Interval:  384 QTC Calculation: 418 R Axis:   70 Text Interpretation:  Sinus rhythm No significant change since last tracing Confirmed by Ausha Sieh,  DO, [place] [phone]) on 10/13/2016 3:15:51 AM          [place], DO 10/13/16 [id number]

## 2016-10-13 NOTE — ED Notes (Signed)
Per Dr. Leonides Schanz, patient complaining of chest pain and shortness of breath.  Acuity changed.

## 2016-10-15 ENCOUNTER — Other Ambulatory Visit (HOSPITAL_COMMUNITY): Payer: Self-pay | Admitting: Pulmonary Disease

## 2016-10-15 ENCOUNTER — Ambulatory Visit: Payer: Medicare Other | Admitting: Nurse Practitioner

## 2016-10-15 DIAGNOSIS — R200 Anesthesia of skin: Secondary | ICD-10-CM

## 2016-10-16 ENCOUNTER — Ambulatory Visit (HOSPITAL_COMMUNITY): Payer: Medicare Other

## 2016-10-17 ENCOUNTER — Ambulatory Visit: Payer: Medicare Other | Admitting: Gastroenterology

## 2016-10-17 ENCOUNTER — Encounter: Payer: Self-pay | Admitting: Internal Medicine

## 2016-10-20 ENCOUNTER — Ambulatory Visit (HOSPITAL_COMMUNITY)
Admission: RE | Admit: 2016-10-20 | Discharge: 2016-10-20 | Disposition: A | Discharge status: Home / Self Care | Payer: Medicare Other | Source: Ambulatory Visit | Attending: Pulmonary Disease | Admitting: Pulmonary Disease

## 2016-10-20 DIAGNOSIS — R202 Paresthesia of skin: Secondary | ICD-10-CM | POA: Diagnosis present

## 2016-10-20 DIAGNOSIS — M50223 Other cervical disc displacement at C6-C7 level: Secondary | ICD-10-CM | POA: Diagnosis not present

## 2016-10-20 DIAGNOSIS — M4802 Spinal stenosis, cervical region: Secondary | ICD-10-CM | POA: Insufficient documentation

## 2016-10-20 DIAGNOSIS — R200 Anesthesia of skin: Secondary | ICD-10-CM

## 2016-10-23 ENCOUNTER — Ambulatory Visit (HOSPITAL_COMMUNITY): Payer: Medicare Other | Admitting: Hematology & Oncology

## 2016-10-24 ENCOUNTER — Ambulatory Visit (HOSPITAL_COMMUNITY): Payer: Medicare Other

## 2016-11-04 ENCOUNTER — Ambulatory Visit: Payer: Medicare Other | Admitting: Gastroenterology

## 2017-01-27 ENCOUNTER — Other Ambulatory Visit (HOSPITAL_COMMUNITY): Payer: Self-pay | Admitting: Pulmonary Disease

## 2017-01-27 DIAGNOSIS — Z1231 Encounter for screening mammogram for malignant neoplasm of breast: Secondary | ICD-10-CM

## 2017-02-12 ENCOUNTER — Ambulatory Visit (HOSPITAL_COMMUNITY): Payer: Medicare Other

## 2017-02-23 DIAGNOSIS — M25521 Pain in right elbow: Secondary | ICD-10-CM | POA: Diagnosis not present

## 2017-02-23 DIAGNOSIS — M2100 Valgus deformity, not elsewhere classified, unspecified site: Secondary | ICD-10-CM | POA: Diagnosis not present

## 2017-02-23 DIAGNOSIS — J441 Chronic obstructive pulmonary disease with (acute) exacerbation: Secondary | ICD-10-CM | POA: Diagnosis not present

## 2017-03-09 ENCOUNTER — Encounter: Payer: Self-pay | Admitting: Internal Medicine

## 2017-03-09 NOTE — Progress Notes (Unsigned)
Pt seen at time of husbands endoscopy today.  Inquired about timing of next tcs.  Chart reviewed.  Hx of appendicile carcinoid and TV adenoma of colon - 2013; had 2 f/u exams last (negative 2016).  She should have a f/u tcs 2021.  She is having somereflux issues and needs an appt.  Lets get her one with extender.  Let her kow about nect TCS thanks

## 2017-03-10 ENCOUNTER — Encounter: Payer: Self-pay | Admitting: Gastroenterology

## 2017-03-10 NOTE — Progress Notes (Signed)
APPT MADE AND LETTER SENT  °

## 2017-03-31 ENCOUNTER — Ambulatory Visit: Payer: Medicare Other | Admitting: Gastroenterology

## 2017-04-02 ENCOUNTER — Ambulatory Visit (HOSPITAL_COMMUNITY): Payer: Medicare Other | Admitting: Oncology

## 2017-04-02 ENCOUNTER — Other Ambulatory Visit (HOSPITAL_COMMUNITY): Payer: Medicare Other

## 2017-04-09 ENCOUNTER — Other Ambulatory Visit (HOSPITAL_COMMUNITY): Payer: Self-pay | Admitting: *Deleted

## 2017-04-09 DIAGNOSIS — D3A020 Benign carcinoid tumor of the appendix: Secondary | ICD-10-CM

## 2017-04-09 DIAGNOSIS — D369 Benign neoplasm, unspecified site: Secondary | ICD-10-CM

## 2017-04-10 ENCOUNTER — Other Ambulatory Visit (HOSPITAL_COMMUNITY): Payer: Medicare Other

## 2017-04-10 ENCOUNTER — Encounter (HOSPITAL_COMMUNITY): Payer: Medicare Other

## 2017-04-10 ENCOUNTER — Ambulatory Visit (HOSPITAL_COMMUNITY): Payer: Medicare Other | Admitting: Oncology

## 2017-04-20 ENCOUNTER — Ambulatory Visit (INDEPENDENT_AMBULATORY_CARE_PROVIDER_SITE_OTHER): Payer: Medicare Other | Admitting: Otolaryngology

## 2017-04-21 ENCOUNTER — Ambulatory Visit: Payer: Medicare Other | Admitting: Gastroenterology

## 2017-04-23 ENCOUNTER — Ambulatory Visit (INDEPENDENT_AMBULATORY_CARE_PROVIDER_SITE_OTHER): Payer: Medicare Other | Admitting: Otolaryngology

## 2017-04-27 ENCOUNTER — Ambulatory Visit (INDEPENDENT_AMBULATORY_CARE_PROVIDER_SITE_OTHER): Payer: Medicare Other | Admitting: Otolaryngology

## 2017-04-30 ENCOUNTER — Ambulatory Visit (INDEPENDENT_AMBULATORY_CARE_PROVIDER_SITE_OTHER): Payer: Medicare Other | Admitting: Otolaryngology

## 2017-05-11 ENCOUNTER — Other Ambulatory Visit (HOSPITAL_COMMUNITY): Payer: Self-pay | Admitting: *Deleted

## 2017-05-12 ENCOUNTER — Other Ambulatory Visit (HOSPITAL_COMMUNITY): Payer: Medicare Other

## 2017-05-12 ENCOUNTER — Encounter: Payer: Self-pay | Admitting: Nurse Practitioner

## 2017-05-12 ENCOUNTER — Ambulatory Visit (HOSPITAL_COMMUNITY): Payer: Medicare Other

## 2017-05-13 ENCOUNTER — Ambulatory Visit: Payer: Medicare Other | Admitting: Nurse Practitioner

## 2017-06-03 ENCOUNTER — Encounter: Payer: Self-pay | Admitting: Nurse Practitioner

## 2017-06-03 ENCOUNTER — Telehealth: Payer: Self-pay | Admitting: Nurse Practitioner

## 2017-06-03 ENCOUNTER — Ambulatory Visit: Payer: Medicare Other | Admitting: Nurse Practitioner

## 2017-06-03 NOTE — Telephone Encounter (Signed)
Patient was a no show and letter sent  °

## 2017-06-03 NOTE — Telephone Encounter (Signed)
Noted  

## 2017-07-24 ENCOUNTER — Other Ambulatory Visit (HOSPITAL_COMMUNITY): Payer: Self-pay | Admitting: Pulmonary Disease

## 2017-07-24 DIAGNOSIS — Z1231 Encounter for screening mammogram for malignant neoplasm of breast: Secondary | ICD-10-CM

## 2017-07-31 ENCOUNTER — Emergency Department (HOSPITAL_COMMUNITY)
Admission: EM | Admit: 2017-07-31 | Discharge: 2017-08-01 | Disposition: A | Discharge status: Home / Self Care | Payer: Medicare Other | Attending: Emergency Medicine | Admitting: Emergency Medicine

## 2017-07-31 ENCOUNTER — Ambulatory Visit (HOSPITAL_COMMUNITY): Payer: Medicare Other

## 2017-07-31 ENCOUNTER — Emergency Department (HOSPITAL_COMMUNITY): Payer: Medicare Other

## 2017-07-31 ENCOUNTER — Encounter (HOSPITAL_COMMUNITY): Payer: Self-pay | Admitting: Emergency Medicine

## 2017-07-31 DIAGNOSIS — M791 Myalgia: Secondary | ICD-10-CM | POA: Diagnosis not present

## 2017-07-31 DIAGNOSIS — J449 Chronic obstructive pulmonary disease, unspecified: Secondary | ICD-10-CM | POA: Insufficient documentation

## 2017-07-31 DIAGNOSIS — G894 Chronic pain syndrome: Secondary | ICD-10-CM | POA: Diagnosis not present

## 2017-07-31 DIAGNOSIS — Z79899 Other long term (current) drug therapy: Secondary | ICD-10-CM | POA: Insufficient documentation

## 2017-07-31 DIAGNOSIS — R072 Precordial pain: Secondary | ICD-10-CM | POA: Diagnosis not present

## 2017-07-31 DIAGNOSIS — R079 Chest pain, unspecified: Secondary | ICD-10-CM | POA: Diagnosis not present

## 2017-07-31 DIAGNOSIS — R109 Unspecified abdominal pain: Secondary | ICD-10-CM | POA: Diagnosis not present

## 2017-07-31 DIAGNOSIS — C7A020 Malignant carcinoid tumor of the appendix: Secondary | ICD-10-CM | POA: Insufficient documentation

## 2017-07-31 DIAGNOSIS — F17290 Nicotine dependence, other tobacco product, uncomplicated: Secondary | ICD-10-CM | POA: Diagnosis not present

## 2017-07-31 DIAGNOSIS — J209 Acute bronchitis, unspecified: Secondary | ICD-10-CM | POA: Insufficient documentation

## 2017-07-31 LAB — CBC
HEMATOCRIT: 40.1 % (ref 36.0–46.0)
Hemoglobin: 14 g/dL (ref 12.0–15.0)
MCH: 30.9 pg (ref 26.0–34.0)
MCHC: 34.9 g/dL (ref 30.0–36.0)
MCV: 88.5 fL (ref 78.0–100.0)
Platelets: 286 10*3/uL (ref 150–400)
RBC: 4.53 MIL/uL (ref 3.87–5.11)
RDW: 12.9 % (ref 11.5–15.5)
WBC: 12.5 10*3/uL — AB (ref 4.0–10.5)

## 2017-07-31 LAB — BASIC METABOLIC PANEL
Anion gap: 8 (ref 5–15)
BUN: 14 mg/dL (ref 6–20)
CHLORIDE: 104 mmol/L (ref 101–111)
CO2: 26 mmol/L (ref 22–32)
Calcium: 8.8 mg/dL — ABNORMAL LOW (ref 8.9–10.3)
Creatinine, Ser: 1.1 mg/dL — ABNORMAL HIGH (ref 0.44–1.00)
GFR calc Af Amer: >60 mL/min (ref >60)
GFR calc non Af Amer: 56 mL/min — ABNORMAL LOW (ref >60)
GLUCOSE: 112 mg/dL — AB (ref 65–99)
POTASSIUM: 3.9 mmol/L (ref 3.5–5.1)
SODIUM: 138 mmol/L (ref 135–145)

## 2017-07-31 LAB — I-STAT TROPONIN, ED: Troponin i, poc: 0 ng/mL (ref 0.00–0.08)

## 2017-07-31 LAB — URINALYSIS, ROUTINE W REFLEX MICROSCOPIC
Bilirubin Urine: NEGATIVE
Glucose, UA: NEGATIVE mg/dL
HGB URINE DIPSTICK: NEGATIVE
Ketones, ur: NEGATIVE mg/dL
Leukocytes, UA: NEGATIVE
Nitrite: NEGATIVE
Protein, ur: NEGATIVE mg/dL
SPECIFIC GRAVITY, URINE: 1.025 (ref 1.005–1.030)
pH: 5 (ref 5.0–8.0)

## 2017-07-31 MED ORDER — HYDROMORPHONE HCL 1 MG/ML IJ SOLN
1.0000 mg | Freq: Once | INTRAMUSCULAR | Status: AC
  Administered 2017-07-31: 1 mg via INTRAVENOUS
  Filled 2017-07-31: qty 1

## 2017-07-31 MED ORDER — DIPHENHYDRAMINE HCL 50 MG/ML IJ SOLN
25.0000 mg | Freq: Once | INTRAMUSCULAR | Status: AC
  Administered 2017-07-31: 25 mg via INTRAVENOUS
  Filled 2017-07-31: qty 1

## 2017-07-31 MED ORDER — ONDANSETRON HCL 4 MG/2ML IJ SOLN
4.0000 mg | Freq: Once | INTRAMUSCULAR | Status: AC
  Administered 2017-07-31: 4 mg via INTRAVENOUS
  Filled 2017-07-31: qty 2

## 2017-07-31 NOTE — ED Notes (Signed)
Pt is complaining of pain and saying it's not any better. Pt is still restless and writhing in bed. Seems anxious.

## 2017-07-31 NOTE — ED Triage Notes (Signed)
Pt complains of Chest pain for 4 days, back pain, N   Dr Luan Pulling is PCP

## 2017-07-31 NOTE — ED Notes (Signed)
Pt informed of need for urine sample. Pt states that she is unable to go at this time due to being "dehydrated". Rn, [place] informed

## 2017-07-31 NOTE — ED Notes (Signed)
Pt to xray

## 2017-08-01 MED ORDER — HYDROMORPHONE HCL 1 MG/ML IJ SOLN
1.0000 mg | Freq: Once | INTRAMUSCULAR | Status: AC
  Administered 2017-08-01: 1 mg via INTRAVENOUS
  Filled 2017-08-01: qty 1

## 2017-08-01 NOTE — Discharge Instructions (Signed)
Continue your current medications.  Would recommend adding on Mucinex DM. Follow-up with Dr. Luan Pulling in the next few days. Return for any new or worse symptoms.

## 2017-08-01 NOTE — ED Provider Notes (Signed)
[facility] DEPT Provider Note   CSN: [id number] Arrival date & time: 07/31/17  2040     History   Chief Complaint Chief Complaint  Patient presents with  . Chest Pain    xm 4 days  . Back Pain  . Fever    HPI CHESSIE NEUHARTH is a 56 y.o. female.  Patient presenting with main complaint of substernal chest pain for 4 days. Associated with cough productive myalgias. And right flank pain. Dr. Luan Pulling is patient's primary provider. Patient has a history of some chronic pain which for which she takes oxycodone. Patient is concerned about the chest pain and concerned about having pneumonia. Patient's also had a history of kidney stones in the past she's worried about that. Patient states she's also felt as if she's had increased heart rate at times. She's had some night sweats. She also has a history of restless leg syndrome which is been worse. Some nausea but no vomiting no diarrhea.      Past Medical History:  Diagnosis Date  . Alopecia, unspecified   . Anxiety   . Arthritis   . Bronchitis   . Cancer [facility])    carcinoid tumor of appendix  . Carcinoid tumor of appendix   . 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 Reoperation for her lymph node dissection revealed all nodes to be absolutely negative.   . Cervicalgia   . COPD (chronic obstructive pulmonary disease) ([place])   . Depression with anxiety   . Dysphagia   . Fibromyalgia   . GERD (gastroesophageal reflux disease)   . IBS (irritable bowel syndrome)   . Interstitial cystitis   . Kidney stones   . Migraine   . PONV (postoperative nausea and vomiting)   . PTSD (post-traumatic stress disorder)   . Renal failure   . RLS (restless legs syndrome)   . Shortness of breath   . Tobacco use disorder   . UTI (lower urinary tract infection)     Patient Active Problem List   Diagnosis Date  Noted  . Chronic rhinitis 08/07/2016  . Moderate persistent asthma 08/07/2016  . Allergic urticaria 08/07/2016  . Adverse food reaction 08/07/2016  . Multiple drug allergies 08/07/2016  . History of colonic polyps   . 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 Acute bronchitis 02/03/2012  . Nausea & vomiting 01/03/2012  . Abdominal pain 12/02/2011  . Chronic diarrhea 12/02/2011  . IBS (irritable bowel syndrome) 12/02/2011  . Weight loss 12/02/2011  . Dysphagia 12/02/2011  . ANXIETY 05/12/2007  . DRUG DEPENDENCE 05/12/2007  . DEPRESSION 05/12/2007  . ALLERGIC RHINITIS 05/12/2007  . ASTHMA 05/12/2007  . COPD 05/12/2007  . GERD 05/12/2007  . LOW BACK PAIN 05/12/2007    Past Surgical History:  Procedure Laterality Date  . ABDOMINAL HYSTERECTOMY    . ABDOMINAL SURGERY    . APPENDECTOMY  01/05/2012   with incidental finding of carcinoid  . CARDIAC CATHETERIZATION     no stents needed  . CARDIAC SURGERY    . CHOLECYSTECTOMY    . COLON RESECTION  01/30/2012   Procedure: HAND ASSISTED LAPAROSCOPIC COLON RESECTION;  Surgeon: Jamesetta So, MD;  Location: [facility] ORS;  Service: General;  Laterality: N/A;  . COLON SURGERY    . COLONOSCOPY WITH PROPOFOL N/A 09/29/2013   DGU:[id number] and colonic polyps-removed as described above  Status post right hemicolectomy  . COLONOSCOPY WITH PROPOFOL N/A 03/15/2015   Procedure: COLONOSCOPY WITH PROPOFOL;  Surgeon: Daneil Dolin, MD;  Location: [facility] ORS;  Service: Endoscopy;  Laterality: N/A;  . CYSTOSTOMY W/ BLADDER DILATION     x 52 [place]  . ESOPHAGEAL DILATION  01/01/2012   RMR: Normal esophagus - status post dilation with 42 F Maloney dilation, gastric polyps benign path  . ESOPHAGOGASTRODUODENOSCOPY (EGD) WITH PROPOFOL N/A 09/29/2013   IRW:[id number] appearing Schatzki's ring-status post dilation as described above. Hiatal hernia.  Marland Kitchen GIVENS CAPSULE STUDY  01/27/2012   normal  . HEMICOLECTOMY    . ileo-colonoscopy   01/01/12   RMR: multiple colonic polyps, largest polyp in proximal sigmoid colon. Tubulovillous adenoma with high grade dysplasia, tubular adenoma  . LAPAROSCOPIC APPENDECTOMY  01/05/2012   Procedure: APPENDECTOMY LAPAROSCOPIC;  Surgeon: Jamesetta So;  Location: [facility];  Service: General;  Laterality: N/A;  . MALONEY DILATION N/A 09/29/2013   Procedure: Venia Minks DILATION;  Surgeon: Daneil Dolin, MD;  Location: [facility];  Service: Endoscopy;  Laterality: N/A;  55  . PARTIAL COLECTOMY  01/30/2012   Procedure: PARTIAL COLECTOMY;  Surgeon: Jamesetta So, MD;  Location: [facility];  Service: General;  Laterality: N/A;  . POLYPECTOMY N/A 09/29/2013   Procedure: POLYPECTOMY;  Surgeon: Daneil Dolin, MD;  Location: [facility];  Service: Endoscopy;  Laterality: N/A;  . TONSILLECTOMY      OB History    Gravida Para Term Preterm AB Living   1 1 1     1    SAB TAB Ectopic Multiple Live Births                   Home Medications    Prior to Admission medications   Medication Sig Start Date End Date Taking? Authorizing Provider  albuterol (PROVENTIL HFA;VENTOLIN HFA) 108 (90 BASE) MCG/ACT inhaler Inhale 1-2 puffs into the lungs every 6 (six) hours as needed for wheezing. 06/14/13  Yes Nat Christen, MD  albuterol (PROVENTIL) (2.5 MG/3ML) 0.083% nebulizer solution Take 2.5 mg by nebulization every 6 (six) hours as needed. Shortness of breath.   Yes [provider]  amphetamine-dextroamphetamine (ADDERALL) 20 MG tablet Take 10 mg by mouth daily. For attention 10/31/11  Yes [provider]  budesonide-formoterol (SYMBICORT) 160-4.5 MCG/ACT inhaler Inhale 2 puffs into the lungs 2 (two) times daily. 08/07/16  Yes Valentina Shaggy, MD  cetirizine (ZYRTEC) 10 MG tablet Take 10 mg by mouth every morning.    Yes [provider]  Cholecalciferol (VITAMIN D3) 2000 units TABS Take 1 tablet by mouth daily.   Yes [provider]  escitalopram (LEXAPRO) 20 MG tablet Take 20 mg by mouth daily.   07/15/17  Yes [provider]  estradiol (ESTRACE) 2 MG tablet Take 2 mg by mouth daily. 09/08/16  Yes [provider]  fluticasone (FLONASE) 50 MCG/ACT nasal spray Place 2 sprays into both nostrils daily. 08/07/16  Yes Valentina Shaggy, MD  furosemide (LASIX) 40 MG tablet Take 40 mg by mouth daily.    Yes [provider]  lansoprazole (PREVACID) 30 MG capsule Take 30 mg by mouth daily at 12 noon.   Yes [provider]  Melatonin 3 MG SUBL Place 1 tablet under the tongue at bedtime.   Yes [provider]  Oxycodone HCl 10 MG TABS Take 10 mg by mouth every 4 (four) hours as needed (for pain).   Yes [provider]  potassium chloride  SA (K-DUR,KLOR-CON) 20 MEQ tablet Take 20 mEq by mouth daily.  08/19/13  Yes [provider]  pramipexole (MIRAPEX) 0.5 MG tablet Take 0.5 mg by mouth at bedtime.    Yes [provider]  vitamin E 100 UNIT capsule Take 100 Units by mouth daily.   Yes [provider]  levofloxacin (LEVAQUIN) 500 MG tablet Take 500 mg by mouth daily.    [provider]    Family History Family History  Problem Relation Age of Onset  . Coronary artery disease Mother   . Heart attack Mother   . Pulmonary embolism Mother   . Alcohol abuse Father   . COPD Father   . Lung disease Father   . Asthma Father   . Anesthesia problems Neg Hx   . Hypotension Neg Hx   . Malignant hyperthermia Neg Hx   . Pseudochol deficiency Neg Hx   . Other Neg Hx   . Allergic rhinitis Neg Hx   . Angioedema Neg Hx   . Atopy Neg Hx   . Eczema Neg Hx   . Immunodeficiency Neg Hx   . Urticaria Neg Hx     Social History Social History  Substance Use Topics  . Smoking status: Current Every Day Smoker    Packs/day: 0.10    Years: 7.00    Types: E-cigarettes  . Smokeless tobacco: Current User  . Alcohol use No     Allergies   Ativan [lorazepam]; Clarithromycin; Ibuprofen; Pseudoephedrine; Reglan  [metoclopramide]; Stadol [butorphanol tartrate]; Sulfa antibiotics; Toradol [ketorolac tromethamine]; Factive [gemifloxacin mesylate]; Other; Prochlorperazine edisylate; Promethazine hcl; Tofranil-pm; and Trimethobenzamide hcl   Review of Systems Review of Systems  Constitutional: Positive for fatigue. Negative for fever.  HENT: Negative for congestion.   Eyes: Negative for redness.  Respiratory: Positive for cough and shortness of breath.   Cardiovascular: Positive for chest pain and palpitations.  Gastrointestinal: Positive for nausea. Negative for diarrhea and vomiting.  Genitourinary: Positive for flank pain. Negative for dysuria.  Musculoskeletal: Positive for back pain and myalgias.  Skin: Negative for rash.  Neurological: Negative for headaches.  Hematological: Does not bruise/bleed easily.  Psychiatric/Behavioral: Negative for confusion.     Physical Exam Updated Vital Signs BP (!) 159/93   Pulse 90   Temp 98.2 F (36.8 C) (Oral)   Resp (!) 23   Ht 1.575 m (5\' 2" )   Wt 71.7 kg (158 lb)   LMP 12/29/1993   SpO2 96%   BMI 28.90 kg/m   Physical Exam  Constitutional: She is oriented to person, place, and time. She appears well-developed and well-nourished. No distress.  HENT:  Head: Normocephalic and atraumatic.  Mouth/Throat: Oropharynx is clear and moist.  Eyes: Pupils are equal, round, and reactive to light. Conjunctivae and EOM are normal.  Neck: Normal range of motion. Neck supple.  Cardiovascular: Normal rate and regular rhythm.   Pulmonary/Chest: Effort normal and breath sounds normal. No respiratory distress. She has no wheezes.  Abdominal: Soft. Bowel sounds are normal. There is no tenderness.  Neurological: She is alert and oriented to person, place, and time. No cranial nerve deficit. She exhibits normal muscle tone. Coordination normal.  Skin: Skin is warm. No rash noted.  Nursing note and vitals reviewed.    ED Treatments / Results  Labs (all labs  ordered are listed, but only abnormal results are displayed) Labs Reviewed  BASIC METABOLIC PANEL - Abnormal; Notable for the following:       Result Value   Glucose,  Bld 112 (*)    Creatinine, Ser 1.10 (*)    Calcium 8.8 (*)    GFR calc non Af Amer 56 (*)    All other components within normal limits  CBC - Abnormal; Notable for the following:    WBC 12.5 (*)    All other components within normal limits  URINALYSIS, ROUTINE W REFLEX MICROSCOPIC  I-STAT TROPONIN, ED    EKG  EKG Interpretation  Date/Time:  Friday July 31 2017 20:57:26 EDT Ventricular Rate:  92 PR Interval:    QRS Duration: 79 QT Interval:  344 QTC Calculation: 426 R Axis:   43 Text Interpretation:  Sinus rhythm Confirmed by Fredia Sorrow [phone]) on 07/31/2017 11:10:28 PM       Radiology Dg Chest 2 View  Result Date: 07/31/2017 CLINICAL DATA:  56 year old female with chest pain. EXAM: CHEST  2 VIEW COMPARISON:  Chest radiograph dated 10/13/2016 FINDINGS: The lungs are clear. There is no pleural effusion or pneumothorax. The cardiac silhouette is within normal limits. Lower cervical fixation plate and screws. No acute osseous pathology. IMPRESSION: No active cardiopulmonary disease. Electronically Signed   By: Anner Crete M.D.   On: 07/31/2017 21:50   Ct Renal Stone Study  Result Date: 07/31/2017 CLINICAL DATA:  56 y/o  F; 4 days of chest pain and flank pain. EXAM: CT ABDOMEN AND PELVIS WITHOUT CONTRAST TECHNIQUE: Multidetector CT imaging of the abdomen and pelvis was performed following the standard protocol without IV contrast. COMPARISON:  01/19/2014 CT of the abdomen and pelvis. FINDINGS: Lower chest: No acute abnormality. Hepatobiliary: No focal liver abnormality is seen. Status post cholecystectomy. No biliary dilatation. Pancreas: Unremarkable. No pancreatic ductal dilatation or surrounding inflammatory changes. Spleen: Normal in size without focal abnormality. Adrenals/Urinary Tract: Adrenal glands are  unremarkable. Kidneys are normal, without renal calculi, focal lesion, or hydronephrosis. Bladder is unremarkable. Stomach/Bowel: Stomach is within normal limits. Partial right hemicolectomy with patent anastomosis in right hemiabdomen. No evidence of bowel wall thickening, distention, or inflammatory changes. Vascular/Lymphatic: No significant vascular findings are present. No enlarged abdominal or pelvic lymph nodes. Reproductive: Status post hysterectomy. No adnexal masses. Other: No abdominal wall hernia or abnormality. No abdominopelvic ascites. Musculoskeletal: No acute or significant osseous findings. IMPRESSION: No acute process identified. No hydronephrosis or urinary stone disease. Stable postsurgical changes as above. Electronically Signed   By: Kristine Garbe M.D.   On: 07/31/2017 22:07    Procedures Procedures (including critical care time)  Medications Ordered in ED Medications  ondansetron [facility]) injection 4 mg (4 mg Intravenous Given 07/31/17 2131)  HYDROmorphone (DILAUDID) injection 1 mg (1 mg Intravenous Given 07/31/17 2131)  HYDROmorphone (DILAUDID) injection 1 mg (1 mg Intravenous Given 07/31/17 2237)  diphenhydrAMINE (BENADRYL) injection 25 mg (25 mg Intravenous Given 07/31/17 2319)  HYDROmorphone (DILAUDID) injection 1 mg (1 mg Intravenous Given 08/01/17 0011)     Initial Impression / Assessment and Plan / ED Course  I have reviewed the triage vital signs and the nursing notes.  Pertinent labs & imaging results that were available during my care of the patient were reviewed by me and considered in my medical decision making (see chart for details).     Patient with an extensive workup for multiple complaints. On the chest pain standpoint that was substernal present for 4 days troponin was negative EKG without any acute findings. Chest x-ray also negative for pneumonia CHF pneumothorax.  Right flank pain with negative as CT of the abdomen. Urinalysis negative for  urinary tract infection.  There was a mild leukocytosis. Slight elevation in creatinine. Otherwise labs are normal.  Patient with improvement of her pain part of the chronic part of the body ache kind of pain and flank pain with the hydromorphone here. Patient's cough although not very present here highly suggestive bronchitis with a negative chest x-ray. Patient already is on oxycodone at home for chronic pain so recommending Mucinex DM to be added on. Patient does not 1 prednisone it makes her crazy in the head. Patient has primary care doctor to follow-up with.  Final Clinical Impressions(s) / ED Diagnoses   Final diagnoses:  Precordial pain  Acute right flank pain  Chronic pain syndrome  Acute bronchitis, unspecified organism    New Prescriptions New Prescriptions   No medications on file     Fredia Sorrow, MD 08/01/17 [phone]

## 2017-09-24 ENCOUNTER — Ambulatory Visit (INDEPENDENT_AMBULATORY_CARE_PROVIDER_SITE_OTHER): Payer: Medicare Other | Admitting: Otolaryngology

## 2017-10-12 ENCOUNTER — Ambulatory Visit (INDEPENDENT_AMBULATORY_CARE_PROVIDER_SITE_OTHER): Payer: Medicare Other | Admitting: Otolaryngology

## 2017-10-29 ENCOUNTER — Ambulatory Visit (INDEPENDENT_AMBULATORY_CARE_PROVIDER_SITE_OTHER): Payer: Medicare Other | Admitting: Otolaryngology

## 2017-11-02 ENCOUNTER — Ambulatory Visit (INDEPENDENT_AMBULATORY_CARE_PROVIDER_SITE_OTHER): Payer: Medicare Other | Admitting: Otolaryngology

## 2017-11-04 DIAGNOSIS — K219 Gastro-esophageal reflux disease without esophagitis: Secondary | ICD-10-CM | POA: Diagnosis not present

## 2017-11-04 DIAGNOSIS — Z23 Encounter for immunization: Secondary | ICD-10-CM | POA: Diagnosis not present

## 2017-11-04 DIAGNOSIS — J449 Chronic obstructive pulmonary disease, unspecified: Secondary | ICD-10-CM | POA: Diagnosis not present

## 2017-11-09 ENCOUNTER — Ambulatory Visit (INDEPENDENT_AMBULATORY_CARE_PROVIDER_SITE_OTHER): Payer: Medicare Other | Admitting: Otolaryngology

## 2017-11-16 ENCOUNTER — Ambulatory Visit (HOSPITAL_COMMUNITY): Payer: Medicare Other

## 2017-11-23 ENCOUNTER — Ambulatory Visit (HOSPITAL_COMMUNITY): Payer: Medicare Other

## 2017-12-02 ENCOUNTER — Ambulatory Visit (HOSPITAL_COMMUNITY): Payer: Medicare Other

## 2017-12-03 ENCOUNTER — Ambulatory Visit (INDEPENDENT_AMBULATORY_CARE_PROVIDER_SITE_OTHER): Payer: Medicare Other | Admitting: Otolaryngology

## 2017-12-18 DIAGNOSIS — Z888 Allergy status to other drugs, medicaments and biological substances status: Secondary | ICD-10-CM | POA: Diagnosis not present

## 2017-12-18 DIAGNOSIS — S134XXA Sprain of ligaments of cervical spine, initial encounter: Secondary | ICD-10-CM | POA: Diagnosis not present

## 2017-12-18 DIAGNOSIS — Z87891 Personal history of nicotine dependence: Secondary | ICD-10-CM | POA: Diagnosis not present

## 2017-12-18 DIAGNOSIS — S060X0A Concussion without loss of consciousness, initial encounter: Secondary | ICD-10-CM | POA: Diagnosis not present

## 2017-12-18 DIAGNOSIS — S098XXA Other specified injuries of head, initial encounter: Secondary | ICD-10-CM | POA: Diagnosis not present

## 2017-12-18 DIAGNOSIS — G4489 Other headache syndrome: Secondary | ICD-10-CM | POA: Diagnosis not present

## 2017-12-18 DIAGNOSIS — Z853 Personal history of malignant neoplasm of breast: Secondary | ICD-10-CM | POA: Diagnosis not present

## 2017-12-18 DIAGNOSIS — Y92410 Unspecified street and highway as the place of occurrence of the external cause: Secondary | ICD-10-CM | POA: Diagnosis not present

## 2018-01-08 ENCOUNTER — Ambulatory Visit (HOSPITAL_COMMUNITY): Payer: Medicare Other

## 2018-01-11 ENCOUNTER — Encounter (HOSPITAL_COMMUNITY): Payer: Self-pay

## 2018-01-11 ENCOUNTER — Ambulatory Visit (HOSPITAL_COMMUNITY)
Admission: RE | Admit: 2018-01-11 | Discharge: 2018-01-11 | Disposition: A | Discharge status: Home / Self Care | Payer: Medicare Other | Source: Ambulatory Visit | Attending: Pulmonary Disease | Admitting: Pulmonary Disease

## 2018-01-11 DIAGNOSIS — Z1231 Encounter for screening mammogram for malignant neoplasm of breast: Secondary | ICD-10-CM | POA: Diagnosis not present

## 2018-01-16 ENCOUNTER — Encounter (HOSPITAL_COMMUNITY): Payer: Self-pay | Admitting: *Deleted

## 2018-01-16 ENCOUNTER — Emergency Department (HOSPITAL_COMMUNITY): Payer: Medicare Other

## 2018-01-16 ENCOUNTER — Emergency Department (HOSPITAL_COMMUNITY)
Admission: EM | Admit: 2018-01-16 | Discharge: 2018-01-17 | Disposition: A | Discharge status: Home / Self Care | Payer: Medicare Other | Attending: Emergency Medicine | Admitting: Emergency Medicine

## 2018-01-16 ENCOUNTER — Other Ambulatory Visit: Payer: Self-pay

## 2018-01-16 DIAGNOSIS — J069 Acute upper respiratory infection, unspecified: Secondary | ICD-10-CM | POA: Diagnosis not present

## 2018-01-16 DIAGNOSIS — Z86012 Personal history of benign carcinoid tumor: Secondary | ICD-10-CM | POA: Diagnosis not present

## 2018-01-16 DIAGNOSIS — M791 Myalgia, unspecified site: Secondary | ICD-10-CM | POA: Diagnosis not present

## 2018-01-16 DIAGNOSIS — R61 Generalized hyperhidrosis: Secondary | ICD-10-CM | POA: Diagnosis not present

## 2018-01-16 DIAGNOSIS — R0981 Nasal congestion: Secondary | ICD-10-CM | POA: Diagnosis not present

## 2018-01-16 DIAGNOSIS — J3489 Other specified disorders of nose and nasal sinuses: Secondary | ICD-10-CM | POA: Diagnosis not present

## 2018-01-16 DIAGNOSIS — R509 Fever, unspecified: Secondary | ICD-10-CM | POA: Diagnosis not present

## 2018-01-16 DIAGNOSIS — J209 Acute bronchitis, unspecified: Secondary | ICD-10-CM | POA: Diagnosis not present

## 2018-01-16 DIAGNOSIS — F17210 Nicotine dependence, cigarettes, uncomplicated: Secondary | ICD-10-CM | POA: Diagnosis not present

## 2018-01-16 DIAGNOSIS — F17228 Nicotine dependence, chewing tobacco, with other nicotine-induced disorders: Secondary | ICD-10-CM | POA: Insufficient documentation

## 2018-01-16 DIAGNOSIS — J45909 Unspecified asthma, uncomplicated: Secondary | ICD-10-CM | POA: Insufficient documentation

## 2018-01-16 DIAGNOSIS — J449 Chronic obstructive pulmonary disease, unspecified: Secondary | ICD-10-CM | POA: Insufficient documentation

## 2018-01-16 DIAGNOSIS — F17290 Nicotine dependence, other tobacco product, uncomplicated: Secondary | ICD-10-CM | POA: Insufficient documentation

## 2018-01-16 DIAGNOSIS — Z79899 Other long term (current) drug therapy: Secondary | ICD-10-CM | POA: Diagnosis not present

## 2018-01-16 DIAGNOSIS — R05 Cough: Secondary | ICD-10-CM | POA: Diagnosis present

## 2018-01-16 NOTE — ED Triage Notes (Signed)
Pt reports being treated for URI. Taking Keflex started 2nd tx yesterday, not getting better.

## 2018-01-16 NOTE — ED Provider Notes (Signed)
[facility] EMERGENCY DEPARTMENT Provider Note   CSN: [id number] Arrival date & time: 01/16/18  2310     History   Chief Complaint Chief Complaint  Patient presents with  . Cough    HPI [name] is a 57 y.o. female.  The history is provided by the patient.  She has a history of COPD, depression, GERD and comes in with persistent respiratory infection.  She started about 3 weeks ago with fullness in her right ear.  She was unable to clear her ear with a Q-tip.  This progressed to nasal congestion and cough.  She saw her PCP who gave her a prescription for azithromycin with no benefit.  This was followed with 2 prescriptions for cephalexin and a third prescription which started yesterday.  The cephalexin has been taken continuously because of failure to respond.  There is some yellow rhinorrhea and cough productive of small amount of thick yellow sputum.  She is complaining of generalized body aches with pain rated at 7/10.  She has had fever as high as 101.2 with associated sweats but no chills.  She denies nausea, vomiting, diarrhea.  She did get the influenza immunization this season.  She is a cigarette smoker-half pack a day.  She has been using her albuterol inhaler with partial relief.  She has also been taking oxycodone which is not really getting her pain relief.  Past Medical History:  Diagnosis Date  . Alopecia, unspecified   . Anxiety   . Arthritis   . Bronchitis   . Cancer [facility])    carcinoid tumor of appendix  . Carcinoid tumor of appendix   . 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 Reoperation for her lymph node dissection revealed all nodes to be absolutely negative.   . Cervicalgia   . COPD (chronic obstructive pulmonary disease) ([place])   . Depression with anxiety   . Dysphagia   . Fibromyalgia   . GERD (gastroesophageal reflux  disease)   . IBS (irritable bowel syndrome)   . Interstitial cystitis   . Kidney stones   . Migraine   . PONV (postoperative nausea and vomiting)   . PTSD (post-traumatic stress disorder)   . Renal failure   . RLS (restless legs syndrome)   . Shortness of breath   . Tobacco use disorder   . UTI (lower urinary tract infection)     Patient Active Problem List   Diagnosis Date Noted  . Chronic rhinitis 08/07/2016  . Moderate persistent asthma 08/07/2016  . Allergic urticaria 08/07/2016  . Adverse food reaction 08/07/2016  . Multiple drug allergies 08/07/2016  . History of colonic polyps   . 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 Acute bronchitis 02/03/2012  . Nausea & vomiting 01/03/2012  . Abdominal pain 12/02/2011  . Chronic diarrhea 12/02/2011  . IBS (irritable bowel syndrome) 12/02/2011  . Weight loss 12/02/2011  . Dysphagia 12/02/2011  . ANXIETY 05/12/2007  . DRUG DEPENDENCE 05/12/2007  . DEPRESSION 05/12/2007  . ALLERGIC RHINITIS 05/12/2007  . ASTHMA 05/12/2007  . COPD 05/12/2007  . GERD 05/12/2007  . LOW BACK PAIN 05/12/2007    Past Surgical History:  Procedure Laterality Date  . ABDOMINAL HYSTERECTOMY    . ABDOMINAL SURGERY    . APPENDECTOMY  01/05/2012   with incidental finding of carcinoid  . CARDIAC CATHETERIZATION  no stents needed  . CARDIAC SURGERY    . CHOLECYSTECTOMY    . COLON RESECTION  01/30/2012   Procedure: HAND ASSISTED LAPAROSCOPIC COLON RESECTION;  Surgeon: Jamesetta So, MD;  Location: [facility];  Service: General;  Laterality: N/A;  . COLON SURGERY    . COLONOSCOPY WITH PROPOFOL N/A 09/29/2013   [id number] and colonic polyps-removed as described above Status post right hemicolectomy  . COLONOSCOPY WITH PROPOFOL N/A 03/15/2015   Procedure: COLONOSCOPY WITH PROPOFOL;  Surgeon: Daneil Dolin, MD;  Location: [facility];  Service: Endoscopy;  Laterality: N/A;  . CYSTOSTOMY W/ BLADDER DILATION     x 52     . ESOPHAGEAL DILATION  01/01/2012   RMR: Normal esophagus - status post dilation with 28 F Maloney dilation, gastric polyps benign path  . ESOPHAGOGASTRODUODENOSCOPY (EGD) WITH PROPOFOL N/A 09/29/2013   [id number] appearing Schatzki's ring-status post dilation as described above. Hiatal hernia.  Marland Kitchen GIVENS CAPSULE STUDY  01/27/2012   normal  . HEMICOLECTOMY    . ileo-colonoscopy  01/01/12   RMR: multiple colonic polyps, largest polyp in proximal sigmoid colon. Tubulovillous adenoma with high grade dysplasia, tubular adenoma  . LAPAROSCOPIC APPENDECTOMY  01/05/2012   Procedure: APPENDECTOMY LAPAROSCOPIC;  Surgeon: Jamesetta So;  Location: [facility];  Service: General;  Laterality: N/A;  . MALONEY DILATION N/A 09/29/2013   Procedure: Venia Minks DILATION;  Surgeon: Daneil Dolin, MD;  Location: [facility];  Service: Endoscopy;  Laterality: N/A;  55  . PARTIAL COLECTOMY  01/30/2012   Procedure: PARTIAL COLECTOMY;  Surgeon: Jamesetta So, MD;  Location: [facility];  Service: General;  Laterality: N/A;  . POLYPECTOMY N/A 09/29/2013   Procedure: POLYPECTOMY;  Surgeon: Daneil Dolin, MD;  Location: [facility];  Service: Endoscopy;  Laterality: N/A;  . TONSILLECTOMY      OB History    Gravida Para Term Preterm AB Living   1 1 1     1    SAB TAB Ectopic Multiple Live Births                   Home Medications    Prior to Admission medications   Medication Sig Start Date End Date Taking? Authorizing Provider  albuterol (PROVENTIL HFA;VENTOLIN HFA) 108 (90 BASE) MCG/ACT inhaler Inhale 1-2 puffs into the lungs every 6 (six) hours as needed for wheezing. 06/14/13   Nat Christen, MD  albuterol (PROVENTIL) (2.5 MG/3ML) 0.083% nebulizer solution Take 2.5 mg by nebulization every 6 (six) hours as needed. Shortness of breath.    [provider]  amphetamine-dextroamphetamine (ADDERALL) 20 MG tablet Take 10 mg by mouth daily. For attention 10/31/11   [provider]  budesonide-formoterol (SYMBICORT)  160-4.5 MCG/ACT inhaler Inhale 2 puffs into the lungs 2 (two) times daily. 08/07/16   Valentina Shaggy, MD  cetirizine (ZYRTEC) 10 MG tablet Take 10 mg by mouth every morning.     [provider]  Cholecalciferol (VITAMIN D3) 2000 units TABS Take 1 tablet by mouth daily.    [provider]  escitalopram (LEXAPRO) 20 MG tablet Take 20 mg by mouth daily.  07/15/17   [provider]  estradiol (ESTRACE) 2 MG tablet Take 2 mg by mouth daily. 09/08/16   [provider]  fluticasone (FLONASE) 50 MCG/ACT nasal spray Place 2 sprays into both nostrils daily. 08/07/16   Valentina Shaggy, MD  furosemide (LASIX) 40 MG tablet Take 40 mg by mouth daily.     [provider]  lansoprazole (PREVACID) 30 MG capsule Take 30 mg by mouth daily at 12 noon.    [provider]  levofloxacin (LEVAQUIN) 500 MG tablet Take 500 mg by mouth daily.    [provider]  Melatonin 3 MG SUBL Place 1 tablet under the tongue at bedtime.    [provider]  Oxycodone HCl 10 MG TABS Take 10 mg by mouth every 4 (four) hours as needed (for pain).    [provider]  potassium chloride SA (K-DUR,KLOR-CON) 20 MEQ tablet Take 20 mEq by mouth daily.  08/19/13   [provider]  pramipexole (MIRAPEX) 0.5 MG tablet Take 0.5 mg by mouth at bedtime.     [provider]  vitamin E 100 UNIT capsule Take 100 Units by mouth daily.    [provider]    Family History Family History  Problem Relation Age of Onset  . Coronary artery disease Mother   . Heart attack Mother   . Pulmonary embolism Mother   . Alcohol abuse Father   . COPD Father   . Lung disease Father   . Asthma Father   . Anesthesia problems Neg Hx   . Hypotension Neg Hx   . Malignant hyperthermia Neg Hx   . Pseudochol deficiency Neg Hx   . Other Neg Hx   . Allergic rhinitis Neg Hx   . Angioedema Neg Hx   . Atopy Neg Hx   . Eczema Neg Hx   . Immunodeficiency  Neg Hx   . Urticaria Neg Hx     Social History Social History   Tobacco Use  . Smoking status: Current Every Day Smoker    Packs/day: 0.10    Years: 7.00    Pack years: 0.70    Types: E-cigarettes  . Smokeless tobacco: Current User  Substance Use Topics  . Alcohol use: No  . Drug use: No     Allergies   Ativan [lorazepam]; Clarithromycin; Ibuprofen; Pseudoephedrine; Reglan [metoclopramide]; Stadol [butorphanol tartrate]; Sulfa antibiotics; Toradol [ketorolac tromethamine]; Factive [gemifloxacin mesylate]; Other; Prochlorperazine edisylate; Promethazine hcl; Tofranil-pm; and Trimethobenzamide hcl   Review of Systems Review of Systems  All other systems reviewed and are negative.    Physical Exam Updated Vital Signs BP (!) 141/78 (BP Location: Right Arm)   Pulse 79   Temp 98.9 F (37.2 C) (Oral)   Resp 18   Ht 5\' 1"  (1.549 m)   Wt 70.3 kg (155 lb)   LMP 12/29/1993   SpO2 97%   BMI 29.29 kg/m   Physical Exam  Nursing note and vitals reviewed.  58 year old female, resting comfortably and in no acute distress. Vital signs are significant for borderline hypertension. Oxygen saturation is 97%, which is normal. Head is normocephalic and atraumatic. PERRLA, EOMI. Oropharynx is clear.  There is mild tenderness of the axillary sinuses bilaterally.  Right nasal turbinate is edematous, no obvious nasal drainage seen. Neck is nontender and supple without adenopathy or JVD. Back is nontender and there is no CVA tenderness. Lungs are have coarse expiratory wheezes without rales or rhonchi. Chest is nontender. Heart has regular rate and rhythm without murmur. Abdomen is soft, flat, nontender without masses or hepatosplenomegaly and peristalsis is normoactive. Extremities have no cyanosis or edema, full range of motion is present. Skin is warm and dry without rash. Neurologic: Mental status is normal, cranial nerves are intact, there are no motor or sensory deficits.  ED  Treatments / Results  Labs (all labs ordered are  listed, but only abnormal results are displayed) Labs Reviewed - No data to display  EKG  EKG Interpretation None       Radiology Dg Chest 2 View  Result Date: 01/16/2018 CLINICAL DATA:  Patient being treated for upper respiratory tract infection without improvement. EXAM: CHEST  2 VIEW COMPARISON:  July 31, 2017 FINDINGS: Mild atelectasis in the bases. The heart, hila, mediastinum, lungs, and pleura are otherwise normal. IMPRESSION: No active cardiopulmonary disease. Electronically Signed   By: Dorise Bullion III M.D   On: 01/16/2018 23:41    Procedures Procedures (including critical care time)  Medications Ordered in ED Medications  ipratropium-albuterol (DUONEB) 0.5-2.5 (3) MG/3ML nebulizer solution 3 mL (3 mLs Nebulization Given 01/17/18 0036)  doxycycline (VIBRA-TABS) tablet 100 mg (100 mg Oral Given 01/17/18 0028)  predniSONE (DELTASONE) tablet 30 mg (30 mg Oral Given 01/17/18 0047)     Initial Impression / Assessment and Plan / ED Course  I have reviewed the triage vital signs and the nursing notes.  Pertinent labs & imaging results that were available during my care of the patient were reviewed by me and considered in my medical decision making (see chart for details).  Respiratory tract infection which is not responding to a course of azithromycin and 2 courses of cephalexin.  Old records are reviewed, and she does have several prior ED visits for bronchitis.  Chest x-ray is obtained showing no evidence of pneumonia.  She is showing evidence of bronchospasm.  I suspect she has underlying influenza, but is well outside the treatment window to initiate antiviral treatment.  She is given dose of prednisone and albuterol with ipratropium nebulizer treatment.  Unfortunately, she has intolerance to NSAIDs because of prior GI bleed.  She had major depression following high-dose prednisone in the past, will give a moderate dose of  prednisone.  She feels much better after above-noted treatment.  She is discharged with a refill for her albuterol inhaler and prescription for doxycycline and a 4-day course of moderate dose prednisone.  Because of history of severe depression on prednisone, advised to stop using it if she has any symptoms of depression.  Given a prescription for celecoxib for her body aches, but advised to discontinue if she starts to have any GI upset.  Follow-up with PCP.  Final Clinical Impressions(s) / ED Diagnoses   Final diagnoses:  Acute bronchitis, unspecified organism    ED Discharge Orders        Ordered    albuterol (PROVENTIL HFA;VENTOLIN HFA) 108 (90 Base) MCG/ACT inhaler  Every 6 hours PRN     01/17/18 0101    doxycycline (VIBRAMYCIN) 100 MG capsule  2 times daily     01/17/18 0101    predniSONE (DELTASONE) 10 MG tablet  Daily     01/17/18 0101    celecoxib (CELEBREX) 100 MG capsule  2 times daily     82/99/37 1696       Delora Fuel, MD 78/93/81 [phone]

## 2018-01-16 NOTE — ED Notes (Signed)
Patient transported to X-ray 

## 2018-01-17 MED ORDER — PREDNISONE 50 MG PO TABS
60.0000 mg | ORAL_TABLET | Freq: Once | ORAL | Status: DC
  Filled 2018-01-17: qty 1

## 2018-01-17 MED ORDER — DOXYCYCLINE HYCLATE 100 MG PO CAPS: 100.0000 mg | ORAL_CAPSULE | Freq: Two times a day (BID) | ORAL | 0 refills | Status: DC

## 2018-01-17 MED ORDER — PREDNISONE 10 MG PO TABS: 30.0000 mg | ORAL_TABLET | Freq: Every day | ORAL | 0 refills | Status: DC

## 2018-01-17 MED ORDER — IBUPROFEN 800 MG PO TABS
800.0000 mg | ORAL_TABLET | Freq: Once | ORAL | Status: DC
  Filled 2018-01-17: qty 1

## 2018-01-17 MED ORDER — CELECOXIB 100 MG PO CAPS: 100.0000 mg | ORAL_CAPSULE | Freq: Two times a day (BID) | ORAL | 0 refills | Status: DC

## 2018-01-17 MED ORDER — DOXYCYCLINE HYCLATE 100 MG PO TABS
100.0000 mg | ORAL_TABLET | Freq: Once | ORAL | Status: AC
  Administered 2018-01-17: 100 mg via ORAL
  Filled 2018-01-17: qty 1

## 2018-01-17 MED ORDER — PREDNISONE 20 MG PO TABS
30.0000 mg | ORAL_TABLET | Freq: Once | ORAL | Status: AC
  Administered 2018-01-17: 01:00:00 30 mg via ORAL
  Filled 2018-01-17: qty 1

## 2018-01-17 MED ORDER — IPRATROPIUM-ALBUTEROL 0.5-2.5 (3) MG/3ML IN SOLN
3.0000 mL | Freq: Once | RESPIRATORY_TRACT | Status: AC
  Administered 2018-01-17: 3 mL via RESPIRATORY_TRACT
  Filled 2018-01-17: qty 3

## 2018-01-17 MED ORDER — ALBUTEROL SULFATE HFA 108 (90 BASE) MCG/ACT IN AERS: 1.0000 | INHALATION_SPRAY | Freq: Four times a day (QID) | RESPIRATORY_TRACT | 0 refills | Status: DC | PRN

## 2018-01-17 NOTE — Discharge Instructions (Signed)
If you start feeling depressed, then stop the prednisone.  Stop smoking!

## 2018-01-17 NOTE — ED Notes (Signed)
RT notified of orders 

## 2018-02-01 DIAGNOSIS — J441 Chronic obstructive pulmonary disease with (acute) exacerbation: Secondary | ICD-10-CM | POA: Diagnosis not present

## 2018-02-04 DIAGNOSIS — Z01419 Encounter for gynecological examination (general) (routine) without abnormal findings: Secondary | ICD-10-CM | POA: Diagnosis not present

## 2018-02-22 DIAGNOSIS — J441 Chronic obstructive pulmonary disease with (acute) exacerbation: Secondary | ICD-10-CM | POA: Diagnosis not present

## 2018-02-22 DIAGNOSIS — Z79891 Long term (current) use of opiate analgesic: Secondary | ICD-10-CM | POA: Diagnosis not present

## 2018-09-06 ENCOUNTER — Other Ambulatory Visit (HOSPITAL_COMMUNITY): Payer: Self-pay | Admitting: Pulmonary Disease

## 2018-09-06 DIAGNOSIS — R109 Unspecified abdominal pain: Secondary | ICD-10-CM

## 2018-09-06 DIAGNOSIS — G2581 Restless legs syndrome: Secondary | ICD-10-CM | POA: Diagnosis not present

## 2018-09-06 DIAGNOSIS — J449 Chronic obstructive pulmonary disease, unspecified: Secondary | ICD-10-CM | POA: Diagnosis not present

## 2018-09-20 ENCOUNTER — Ambulatory Visit (HOSPITAL_COMMUNITY): Payer: Medicare Other

## 2018-09-20 ENCOUNTER — Encounter (HOSPITAL_COMMUNITY): Payer: Self-pay

## 2018-10-18 DIAGNOSIS — M791 Myalgia, unspecified site: Secondary | ICD-10-CM | POA: Diagnosis not present

## 2018-10-18 DIAGNOSIS — N3010 Interstitial cystitis (chronic) without hematuria: Secondary | ICD-10-CM | POA: Diagnosis not present

## 2018-10-18 DIAGNOSIS — G2581 Restless legs syndrome: Secondary | ICD-10-CM | POA: Diagnosis not present

## 2018-10-18 DIAGNOSIS — E785 Hyperlipidemia, unspecified: Secondary | ICD-10-CM | POA: Diagnosis not present

## 2018-10-18 DIAGNOSIS — J449 Chronic obstructive pulmonary disease, unspecified: Secondary | ICD-10-CM | POA: Diagnosis not present

## 2018-10-18 DIAGNOSIS — N951 Menopausal and female climacteric states: Secondary | ICD-10-CM | POA: Diagnosis not present

## 2018-10-18 DIAGNOSIS — K219 Gastro-esophageal reflux disease without esophagitis: Secondary | ICD-10-CM | POA: Diagnosis not present

## 2018-10-20 ENCOUNTER — Ambulatory Visit (HOSPITAL_COMMUNITY): Payer: Medicare Other

## 2018-11-10 ENCOUNTER — Ambulatory Visit (HOSPITAL_COMMUNITY): Payer: Medicare Other

## 2018-11-30 ENCOUNTER — Ambulatory Visit (HOSPITAL_COMMUNITY): Payer: Medicare Other

## 2018-12-16 ENCOUNTER — Ambulatory Visit (HOSPITAL_COMMUNITY): Payer: Medicare Other

## 2019-01-04 ENCOUNTER — Ambulatory Visit (HOSPITAL_COMMUNITY): Payer: Medicare Other

## 2019-02-10 ENCOUNTER — Other Ambulatory Visit (HOSPITAL_COMMUNITY): Payer: Self-pay | Admitting: Pulmonary Disease

## 2019-02-10 DIAGNOSIS — Z1231 Encounter for screening mammogram for malignant neoplasm of breast: Secondary | ICD-10-CM

## 2019-02-23 ENCOUNTER — Ambulatory Visit (HOSPITAL_COMMUNITY): Payer: Medicare Other

## 2019-02-28 ENCOUNTER — Other Ambulatory Visit: Payer: Self-pay | Admitting: Pulmonary Disease

## 2019-02-28 DIAGNOSIS — M542 Cervicalgia: Secondary | ICD-10-CM

## 2019-03-03 ENCOUNTER — Ambulatory Visit (HOSPITAL_COMMUNITY): Payer: Medicare Other

## 2019-03-09 ENCOUNTER — Ambulatory Visit (HOSPITAL_COMMUNITY): Payer: Medicare Other

## 2019-04-01 DIAGNOSIS — Z0000 Encounter for general adult medical examination without abnormal findings: Secondary | ICD-10-CM | POA: Diagnosis not present

## 2019-06-30 ENCOUNTER — Ambulatory Visit (HOSPITAL_COMMUNITY): Payer: Medicare Other

## 2019-07-06 ENCOUNTER — Ambulatory Visit (HOSPITAL_COMMUNITY): Payer: Medicare Other

## 2019-08-10 ENCOUNTER — Ambulatory Visit (HOSPITAL_COMMUNITY): Payer: Medicare Other

## 2019-08-26 ENCOUNTER — Ambulatory Visit (HOSPITAL_COMMUNITY): Payer: Medicare Other

## 2019-09-23 ENCOUNTER — Inpatient Hospital Stay (HOSPITAL_COMMUNITY): Admission: RE | Admit: 2019-09-23 | Payer: Medicare Other | Source: Ambulatory Visit

## 2019-09-30 ENCOUNTER — Ambulatory Visit (HOSPITAL_COMMUNITY): Payer: Medicare Other

## 2019-11-18 ENCOUNTER — Other Ambulatory Visit (HOSPITAL_COMMUNITY): Payer: Self-pay | Admitting: Pulmonary Disease

## 2019-11-18 DIAGNOSIS — G2581 Restless legs syndrome: Secondary | ICD-10-CM | POA: Diagnosis not present

## 2019-11-18 DIAGNOSIS — M159 Polyosteoarthritis, unspecified: Secondary | ICD-10-CM | POA: Diagnosis not present

## 2019-11-18 DIAGNOSIS — Z23 Encounter for immunization: Secondary | ICD-10-CM | POA: Diagnosis not present

## 2019-11-18 DIAGNOSIS — J449 Chronic obstructive pulmonary disease, unspecified: Secondary | ICD-10-CM | POA: Diagnosis not present

## 2019-11-18 DIAGNOSIS — R109 Unspecified abdominal pain: Secondary | ICD-10-CM

## 2019-12-15 DIAGNOSIS — Z1231 Encounter for screening mammogram for malignant neoplasm of breast: Secondary | ICD-10-CM | POA: Diagnosis not present

## 2020-02-02 DIAGNOSIS — R5383 Other fatigue: Secondary | ICD-10-CM | POA: Diagnosis not present

## 2020-02-02 DIAGNOSIS — Z79899 Other long term (current) drug therapy: Secondary | ICD-10-CM | POA: Diagnosis not present

## 2020-02-02 DIAGNOSIS — M129 Arthropathy, unspecified: Secondary | ICD-10-CM | POA: Diagnosis not present

## 2020-02-02 DIAGNOSIS — R209 Unspecified disturbances of skin sensation: Secondary | ICD-10-CM | POA: Diagnosis not present

## 2020-02-02 DIAGNOSIS — F17210 Nicotine dependence, cigarettes, uncomplicated: Secondary | ICD-10-CM | POA: Diagnosis not present

## 2020-02-02 DIAGNOSIS — E559 Vitamin D deficiency, unspecified: Secondary | ICD-10-CM | POA: Diagnosis not present

## 2020-02-13 ENCOUNTER — Encounter: Payer: Self-pay | Admitting: Internal Medicine

## 2020-03-15 DIAGNOSIS — M129 Arthropathy, unspecified: Secondary | ICD-10-CM | POA: Diagnosis not present

## 2020-03-15 DIAGNOSIS — R5383 Other fatigue: Secondary | ICD-10-CM | POA: Diagnosis not present

## 2020-03-15 DIAGNOSIS — Z79891 Long term (current) use of opiate analgesic: Secondary | ICD-10-CM | POA: Diagnosis not present

## 2020-03-15 DIAGNOSIS — Z79899 Other long term (current) drug therapy: Secondary | ICD-10-CM | POA: Diagnosis not present

## 2020-03-15 DIAGNOSIS — Z03818 Encounter for observation for suspected exposure to other biological agents ruled out: Secondary | ICD-10-CM | POA: Diagnosis not present

## 2020-03-15 DIAGNOSIS — Z7689 Persons encountering health services in other specified circumstances: Secondary | ICD-10-CM | POA: Diagnosis not present

## 2020-03-15 DIAGNOSIS — M130 Polyarthritis, unspecified: Secondary | ICD-10-CM | POA: Diagnosis not present

## 2020-03-24 ENCOUNTER — Encounter: Payer: Self-pay | Admitting: Gastroenterology

## 2020-03-24 NOTE — Progress Notes (Deleted)
Primary Care Physician:  Sinda Du, MD Primary Gastroenterologist:  Dr. Gala Romney  No chief complaint on file.   HPI:   [name] is a 59 y.o. female with GI  history of IBS, appendiceal carcinoid, and tubulovillous adenoma of the colon in 2013.  Last colonoscopy in March 2016 normal, due for repeat colonoscopy in 2021.  Also with history of GERD and dysphagia.  Last EGD October 2014 with noncritical Schatzki's ring s/p dilation and hiatal hernia.  Last seen in our office in 2016 prior to colonoscopy.   She presents today for ***  Today:   Past Medical History:  Diagnosis Date  . Alopecia, unspecified   . Anxiety   . Arthritis   . Bronchitis   . 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 Carcinoid tumor of appendix   . 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 Reoperation for her lymph node dissection revealed all nodes to be absolutely negative.   . Cervicalgia   . COPD (chronic obstructive pulmonary disease) ([place])   . Depression with anxiety   . Dysphagia   . Fibromyalgia   . GERD (gastroesophageal reflux disease)   . IBS (irritable bowel syndrome)   . Interstitial cystitis   . Kidney stones   . Migraine   . PONV (postoperative nausea and vomiting)   . PTSD (post-traumatic stress disorder)   . Renal failure   . RLS (restless legs syndrome)   . Shortness of breath   . Tobacco use disorder   . UTI (lower urinary tract infection)     Past Surgical History:  Procedure Laterality Date  . ABDOMINAL HYSTERECTOMY    . ABDOMINAL SURGERY    . APPENDECTOMY  01/05/2012   with incidental finding of carcinoid  . CARDIAC CATHETERIZATION     no stents needed  . CARDIAC SURGERY    . CHOLECYSTECTOMY    . COLON RESECTION  01/30/2012   Procedure: HAND ASSISTED LAPAROSCOPIC COLON RESECTION;  Surgeon: Jamesetta So, MD;  Location: [facility];  Service: General;  Laterality: N/A;  . COLON SURGERY    . COLONOSCOPY WITH PROPOFOL N/A 09/29/2013   [id number] and colonic polyps-removed as described above Status post right hemicolectomy  . COLONOSCOPY WITH PROPOFOL N/A 03/15/2015   Procedure: COLONOSCOPY WITH PROPOFOL;  Surgeon: Daneil Dolin, MD;  Location: [facility];  Service: Endoscopy;  Laterality: N/A;  . CYSTOSTOMY W/ BLADDER DILATION     x 52 [place]  . ESOPHAGEAL DILATION  01/01/2012   RMR: Normal esophagus - status post dilation with 60 F Maloney dilation, gastric polyps benign path  . ESOPHAGOGASTRODUODENOSCOPY (EGD) WITH PROPOFOL N/A 09/29/2013   [id number] appearing Schatzki's ring-status post dilation as described above. Hiatal hernia.  Marland Kitchen GIVENS CAPSULE STUDY  01/27/2012   normal  . HEMICOLECTOMY    . ileo-colonoscopy  01/01/12   RMR: multiple colonic polyps, largest polyp in proximal sigmoid colon. Tubulovillous adenoma with high grade dysplasia, tubular adenoma  . LAPAROSCOPIC APPENDECTOMY  01/05/2012   Procedure: APPENDECTOMY LAPAROSCOPIC;  Surgeon: Jamesetta So;  Location: [facility];  Service: General;  Laterality: N/A;  . MALONEY DILATION N/A 09/29/2013   Procedure: Venia Minks DILATION;  Surgeon: Daneil Dolin, MD;  Location: [facility];  Service: Endoscopy;  Laterality: N/A;  54  . PARTIAL COLECTOMY  01/30/2012   Procedure: PARTIAL COLECTOMY;  Surgeon: Jamesetta So, MD;  Location: [facility];  Service: General;  Laterality: N/A;  . POLYPECTOMY N/A 09/29/2013   Procedure: POLYPECTOMY;  Surgeon: Daneil Dolin, MD;  Location: [facility];  Service: Endoscopy;  Laterality: N/A;  . TONSILLECTOMY      Current Outpatient Medications  Medication Sig Dispense Refill  . albuterol (PROVENTIL HFA;VENTOLIN HFA) 108 (90 Base) MCG/ACT inhaler Inhale 1-2 puffs into the lungs every 6 (six) hours as needed for wheezing. 1 Inhaler 0  . albuterol (PROVENTIL) (2.5 MG/3ML) 0.083% nebulizer solution Take 2.5 mg by nebulization every 6 (six) hours as  needed. Shortness of breath.    . amphetamine-dextroamphetamine (ADDERALL) 20 MG tablet Take 10 mg by mouth daily. For attention    . budesonide-formoterol (SYMBICORT) 160-4.5 MCG/ACT inhaler Inhale 2 puffs into the lungs 2 (two) times daily. 1 Inhaler 5  . celecoxib (CELEBREX) 100 MG capsule Take 1 capsule (100 mg total) by mouth 2 (two) times daily. 10 capsule 0  . cetirizine (ZYRTEC) 10 MG tablet Take 10 mg by mouth every morning.     . Cholecalciferol (VITAMIN D3) 2000 units TABS Take 1 tablet by mouth daily.    Marland Kitchen doxycycline (VIBRAMYCIN) 100 MG capsule Take 1 capsule (100 mg total) by mouth 2 (two) times daily. 20 capsule 0  . escitalopram (LEXAPRO) 20 MG tablet Take 20 mg by mouth daily.     Marland Kitchen estradiol (ESTRACE) 2 MG tablet Take 2 mg by mouth daily.    . fluticasone (FLONASE) 50 MCG/ACT nasal spray Place 2 sprays into both nostrils daily. 16 g 5  . furosemide (LASIX) 40 MG tablet Take 40 mg by mouth daily.     . lansoprazole (PREVACID) 30 MG capsule Take 30 mg by mouth daily at 12 noon.    . Melatonin 3 MG SUBL Place 1 tablet under the tongue at bedtime.    . Oxycodone HCl 10 MG TABS Take 10 mg by mouth every 4 (four) hours as needed (for pain).    . potassium chloride SA (K-DUR,KLOR-CON) 20 MEQ tablet Take 20 mEq by mouth daily.     . pramipexole (MIRAPEX) 0.5 MG tablet Take 0.5 mg by mouth at bedtime.     . predniSONE (DELTASONE) 10 MG tablet Take 3 tablets (30 mg total) by mouth daily. 2 tabs po daily x 4 days 12 tablet 0  . vitamin E 100 UNIT capsule Take 100 Units by mouth daily.     No current facility-administered medications for this visit.    Allergies as of 03/26/2020 - Review Complete 01/16/2018  Allergen Reaction Noted  . Ativan [lorazepam] Other (See Comments) 01/29/2012  . Clarithromycin Itching and Nausea And Vomiting   . Ibuprofen Other (See Comments) 01/08/2013  . Pseudoephedrine Hives   . Reglan [metoclopramide] Other (See Comments) 06/14/2013  . Stadol  [butorphanol tartrate] Nausea And Vomiting 07/19/2012  . Sulfa antibiotics Nausea And Vomiting 08/28/2011  . Toradol [ketorolac tromethamine] Other (See Comments) 07/30/2012  . Factive [gemifloxacin mesylate] Rash 08/28/2011  . Other Rash 07/19/2012  . Prochlorperazine edisylate Anxiety   . Promethazine hcl Anxiety   . Tofranil-pm Rash 01/02/2012  . Trimethobenzamide hcl Anxiety     Family History  Problem Relation Age of Onset  . Coronary artery disease Mother   . Heart attack Mother   . Pulmonary embolism Mother   . Alcohol abuse Father   . COPD Father   . Lung disease Father   . Asthma Father   . Anesthesia problems  Neg Hx   . Hypotension Neg Hx   . Malignant hyperthermia Neg Hx   . Pseudochol deficiency Neg Hx   . Other Neg Hx   . Allergic rhinitis Neg Hx   . Angioedema Neg Hx   . Atopy Neg Hx   . Eczema Neg Hx   . Immunodeficiency Neg Hx   . Urticaria Neg Hx     Social History   Socioeconomic History  . Marital status: Widowed    Spouse name: Not on file  . Number of children: 1  . Years of education: Not on file  . Highest education level: Not on file  Occupational History  . Occupation: disabled    Fish farm manager: UNEMPLOYED  Tobacco Use  . Smoking status: Current Every Day Smoker    Packs/day: 0.10    Years: 7.00    Pack years: 0.70    Types: E-cigarettes  . Smokeless tobacco: Current User  Substance and Sexual Activity  . Alcohol use: No  . Drug use: No  . Sexual activity: Yes    Birth control/protection: Surgical  Other Topics Concern  . Not on file  Social History Narrative  . Not on file   Social Determinants of Health   Financial Resource Strain:   . Difficulty of Paying Living Expenses:   Food Insecurity:   . Worried About Charity fundraiser in the Last Year:   . Arboriculturist in the Last Year:   Transportation Needs:   . Film/video editor (Medical):   Marland Kitchen Lack of Transportation (Non-Medical):   Physical Activity:   . Days of  Exercise per Week:   . Minutes of Exercise per Session:   Stress:   . Feeling of Stress :   Social Connections:   . Frequency of Communication with Friends and Family:   . Frequency of Social Gatherings with Friends and Family:   . Attends Religious Services:   . Active Member of Clubs or Organizations:   . Attends Archivist Meetings:   Marland Kitchen Marital Status:   Intimate Partner Violence:   . Fear of Current or Ex-Partner:   . Emotionally Abused:   Marland Kitchen Physically Abused:   . Sexually Abused:     Review of Systems: Gen: Denies any fever, chills, fatigue, weight loss, lack of appetite.  CV: Denies chest pain, heart palpitations, peripheral edema, syncope.  Resp: Denies shortness of breath at rest or with exertion. Denies wheezing or cough.  GI: Denies dysphagia or odynophagia. Denies jaundice, hematemesis, fecal incontinence. GU : Denies urinary burning, urinary frequency, urinary hesitancy MS: Denies joint pain, muscle weakness, cramps, or limitation of movement.  Derm: Denies rash, itching, dry skin Psych: Denies depression, anxiety, memory loss, and confusion Heme: Denies bruising, bleeding, and enlarged lymph nodes.  Physical Exam: LMP 12/29/1993  General:   Alert and oriented. Pleasant and cooperative. Well-nourished and well-developed.  Head:  Normocephalic and atraumatic. Eyes:  Without icterus, sclera clear and conjunctiva pink.  Ears:  Normal auditory acuity. Nose:  No deformity, discharge,  or lesions. Mouth:  No deformity or lesions, oral mucosa pink.  Neck:  Supple, without mass or thyromegaly. Lungs:  Clear to auscultation bilaterally. No wheezes, rales, or rhonchi. No distress.  Heart:  S1, S2 present without murmurs appreciated.  Abdomen:  +BS, soft, non-tender and non-distended. No HSM noted. No guarding or rebound. No masses appreciated.  Rectal:  Deferred  Msk:  Symmetrical without gross deformities. Normal posture. Pulses:  Normal pulses  noted. Extremities:  Without clubbing or edema. Neurologic:  Alert and  oriented x4;  grossly normal neurologically. Skin:  Intact without significant lesions or rashes. Cervical Nodes:  No significant cervical adenopathy. Psych:  Alert and cooperative. Normal mood and affect.

## 2020-03-26 ENCOUNTER — Ambulatory Visit: Payer: Medicare Other | Admitting: Gastroenterology

## 2020-03-26 DIAGNOSIS — J0100 Acute maxillary sinusitis, unspecified: Secondary | ICD-10-CM | POA: Diagnosis not present

## 2020-04-05 NOTE — Progress Notes (Deleted)
Referring Provider:*** Primary Care Physician:  Sinda Du, MD Primary Gastroenterologist:  Dr. Rayne Du chief complaint on file.   HPI:   [name] is a 59 y.o. female presenting for nausea and dysphagia.   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  Last colonoscopy in March 2016 with normal exam and recommended repeat in 5 years (currently due).  Last seen in our office in February 2016.   Past Medical History:  Diagnosis Date  . Alopecia, unspecified   . Anxiety   . Arthritis   . Bronchitis   . Cancer [facility])    carcinoid tumor of appendix  . Carcinoid tumor of appendix   . 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 Reoperation for her lymph node dissection revealed all nodes to be absolutely negative.   . Cervicalgia   . COPD (chronic obstructive pulmonary disease) ([place])   . Depression with anxiety   . Dysphagia   . Fibromyalgia   . GERD (gastroesophageal reflux disease)   . IBS (irritable bowel syndrome)   . Interstitial cystitis   . Kidney stones   . Migraine   . PONV (postoperative nausea and vomiting)   . PTSD (post-traumatic stress disorder)   . Renal failure   . RLS (restless legs syndrome)   . Shortness of breath   . Tobacco use disorder   . UTI (lower urinary tract infection)     Past Surgical History:  Procedure Laterality Date  . ABDOMINAL HYSTERECTOMY    . ABDOMINAL SURGERY    . APPENDECTOMY  01/05/2012   with incidental finding of carcinoid  . CARDIAC CATHETERIZATION     no stents needed  . CARDIAC SURGERY    . CHOLECYSTECTOMY    . COLON RESECTION  01/30/2012   Procedure: HAND  ASSISTED LAPAROSCOPIC COLON RESECTION;  Surgeon: Jamesetta So, MD;  Location: [facility];  Service: General;  Laterality: N/A;  . COLON SURGERY    . COLONOSCOPY WITH PROPOFOL N/A 09/29/2013   [id number] and colonic polyps-removed as described above Status post right hemicolectomy  . COLONOSCOPY WITH PROPOFOL N/A 03/15/2015   Procedure: COLONOSCOPY WITH PROPOFOL;  Surgeon: Daneil Dolin, MD; normal exam.  Repeat in 5 years.  . CYSTOSTOMY W/ BLADDER DILATION     x 52 [place]  . ESOPHAGEAL DILATION  01/01/2012   RMR: Normal esophagus - status post dilation with 74 F Maloney dilation, gastric polyps benign path  . ESOPHAGOGASTRODUODENOSCOPY (EGD) WITH PROPOFOL N/A 09/29/2013   [id number] appearing Schatzki's ring-status post dilation as described above. Hiatal hernia.  Marland Kitchen GIVENS CAPSULE STUDY  01/27/2012   normal  . HEMICOLECTOMY    . ileo-colonoscopy  01/01/12   RMR: multiple colonic polyps, largest polyp in proximal sigmoid colon. Tubulovillous adenoma with high grade dysplasia, tubular adenoma  . LAPAROSCOPIC APPENDECTOMY  01/05/2012   Procedure: APPENDECTOMY LAPAROSCOPIC;  Surgeon: Jamesetta So;  Location: [facility];  Service: General;  Laterality: N/A;  . MALONEY DILATION N/A 09/29/2013   Procedure: Venia Minks DILATION;  Surgeon: Daneil Dolin, MD;  Location: [facility];  Service: Endoscopy;  Laterality: N/A;  55  . PARTIAL COLECTOMY  01/30/2012   Procedure: PARTIAL  COLECTOMY;  Surgeon: Jamesetta So, MD;  Location: [facility];  Service: General;  Laterality: N/A;  . POLYPECTOMY N/A 09/29/2013   Procedure: POLYPECTOMY;  Surgeon: Daneil Dolin, MD;  Location: [facility];  Service: Endoscopy;  Laterality: N/A;  . TONSILLECTOMY      Current Outpatient Medications  Medication Sig Dispense Refill  . albuterol (PROVENTIL HFA;VENTOLIN HFA) 108 (90 Base) MCG/ACT inhaler Inhale 1-2 puffs into the lungs every 6 (six) hours as needed for wheezing. 1 Inhaler 0  . albuterol (PROVENTIL) (2.5 MG/3ML) 0.083% nebulizer  solution Take 2.5 mg by nebulization every 6 (six) hours as needed. Shortness of breath.    . amphetamine-dextroamphetamine (ADDERALL) 20 MG tablet Take 10 mg by mouth daily. For attention    . budesonide-formoterol (SYMBICORT) 160-4.5 MCG/ACT inhaler Inhale 2 puffs into the lungs 2 (two) times daily. 1 Inhaler 5  . celecoxib (CELEBREX) 100 MG capsule Take 1 capsule (100 mg total) by mouth 2 (two) times daily. 10 capsule 0  . cetirizine (ZYRTEC) 10 MG tablet Take 10 mg by mouth every morning.     . Cholecalciferol (VITAMIN D3) 2000 units TABS Take 1 tablet by mouth daily.    Marland Kitchen doxycycline (VIBRAMYCIN) 100 MG capsule Take 1 capsule (100 mg total) by mouth 2 (two) times daily. 20 capsule 0  . escitalopram (LEXAPRO) 20 MG tablet Take 20 mg by mouth daily.     Marland Kitchen estradiol (ESTRACE) 2 MG tablet Take 2 mg by mouth daily.    . fluticasone (FLONASE) 50 MCG/ACT nasal spray Place 2 sprays into both nostrils daily. 16 g 5  . furosemide (LASIX) 40 MG tablet Take 40 mg by mouth daily.     . lansoprazole (PREVACID) 30 MG capsule Take 30 mg by mouth daily at 12 noon.    . Melatonin 3 MG SUBL Place 1 tablet under the tongue at bedtime.    . Oxycodone HCl 10 MG TABS Take 10 mg by mouth every 4 (four) hours as needed (for pain).    . potassium chloride SA (K-DUR,KLOR-CON) 20 MEQ tablet Take 20 mEq by mouth daily.     . pramipexole (MIRAPEX) 0.5 MG tablet Take 0.5 mg by mouth at bedtime.     . predniSONE (DELTASONE) 10 MG tablet Take 3 tablets (30 mg total) by mouth daily. 2 tabs po daily x 4 days 12 tablet 0  . vitamin E 100 UNIT capsule Take 100 Units by mouth daily.     No current facility-administered medications for this visit.    Allergies as of 04/06/2020 - Review Complete 01/16/2018  Allergen Reaction Noted  . Ativan [lorazepam] Other (See Comments) 01/29/2012  . Clarithromycin Itching and Nausea And Vomiting   . Ibuprofen Other (See Comments) 01/08/2013  . Pseudoephedrine Hives   . Reglan  [metoclopramide] Other (See Comments) 06/14/2013  . Stadol [butorphanol tartrate] Nausea And Vomiting 07/19/2012  . Sulfa antibiotics Nausea And Vomiting 08/28/2011  . Toradol [ketorolac tromethamine] Other (See Comments) 07/30/2012  . Factive [gemifloxacin mesylate] Rash 08/28/2011  . Other Rash 07/19/2012  . Prochlorperazine edisylate Anxiety   . Promethazine hcl Anxiety   . Tofranil-pm Rash 01/02/2012  . Trimethobenzamide hcl Anxiety     Family History  Problem Relation Age of Onset  . Coronary artery disease Mother   . Heart attack Mother   . Pulmonary embolism Mother   . Alcohol abuse Father   . COPD Father   . Lung disease Father   . Asthma Father   .  Anesthesia problems Neg Hx   . Hypotension Neg Hx   . Malignant hyperthermia Neg Hx   . Pseudochol deficiency Neg Hx   . Other Neg Hx   . Allergic rhinitis Neg Hx   . Angioedema Neg Hx   . Atopy Neg Hx   . Eczema Neg Hx   . Immunodeficiency Neg Hx   . Urticaria Neg Hx     Social History   Socioeconomic History  . Marital status: Widowed    Spouse name: Not on file  . Number of children: 1  . Years of education: Not on file  . Highest education level: Not on file  Occupational History  . Occupation: disabled    Fish farm manager: UNEMPLOYED  Tobacco Use  . Smoking status: Current Every Day Smoker    Packs/day: 0.10    Years: 7.00    Pack years: 0.70    Types: E-cigarettes  . Smokeless tobacco: Current User  Substance and Sexual Activity  . Alcohol use: No  . Drug use: No  . Sexual activity: Yes    Birth control/protection: Surgical  Other Topics Concern  . Not on file  Social History Narrative  . Not on file   Social Determinants of Health   Financial Resource Strain:   . Difficulty of Paying Living Expenses:   Food Insecurity:   . Worried About Charity fundraiser in the Last Year:   . Arboriculturist in the Last Year:   Transportation Needs:   . Film/video editor (Medical):   Marland Kitchen Lack of  Transportation (Non-Medical):   Physical Activity:   . Days of Exercise per Week:   . Minutes of Exercise per Session:   Stress:   . Feeling of Stress :   Social Connections:   . Frequency of Communication with Friends and Family:   . Frequency of Social Gatherings with Friends and Family:   . Attends Religious Services:   . Active Member of Clubs or Organizations:   . Attends Archivist Meetings:   Marland Kitchen Marital Status:   Intimate Partner Violence:   . Fear of Current or Ex-Partner:   . Emotionally Abused:   Marland Kitchen Physically Abused:   . Sexually Abused:     Review of Systems: Gen: Denies any fever, chills, fatigue, weight loss, lack of appetite.  CV: Denies chest pain, heart palpitations, peripheral edema, syncope.  Resp: Denies shortness of breath at rest or with exertion. Denies wheezing or cough.  GI: Denies dysphagia or odynophagia. Denies jaundice, hematemesis, fecal incontinence. GU : Denies urinary burning, urinary frequency, urinary hesitancy MS: Denies joint pain, muscle weakness, cramps, or limitation of movement.  Derm: Denies rash, itching, dry skin Psych: Denies depression, anxiety, memory loss, and confusion Heme: Denies bruising, bleeding, and enlarged lymph nodes.  Physical Exam: LMP 12/29/1993  General:   Alert and oriented. Pleasant and cooperative. Well-nourished and well-developed.  Head:  Normocephalic and atraumatic. Eyes:  Without icterus, sclera clear and conjunctiva pink.  Ears:  Normal auditory acuity. Nose:  No deformity, discharge,  or lesions. Mouth:  No deformity or lesions, oral mucosa pink.  Neck:  Supple, without mass or thyromegaly. Lungs:  Clear to auscultation bilaterally. No wheezes, rales, or rhonchi. No distress.  Heart:  S1, S2 present without murmurs appreciated.  Abdomen:  +BS, soft, non-tender and non-distended. No HSM noted. No guarding or rebound. No masses appreciated.  Rectal:  Deferred  Msk:  Symmetrical without gross  deformities. Normal posture. Pulses:  Normal pulses noted. Extremities:  Without clubbing or edema. Neurologic:  Alert and  oriented x4;  grossly normal neurologically. Skin:  Intact without significant lesions or rashes. Cervical Nodes:  No significant cervical adenopathy. Psych:  Alert and cooperative. Normal mood and affect.

## 2020-04-06 ENCOUNTER — Ambulatory Visit: Payer: Medicare Other | Admitting: Gastroenterology

## 2020-04-20 DIAGNOSIS — Z79891 Long term (current) use of opiate analgesic: Secondary | ICD-10-CM | POA: Diagnosis not present

## 2020-04-20 DIAGNOSIS — Z79899 Other long term (current) drug therapy: Secondary | ICD-10-CM | POA: Diagnosis not present

## 2020-04-20 DIAGNOSIS — R0602 Shortness of breath: Secondary | ICD-10-CM | POA: Diagnosis not present

## 2020-04-20 DIAGNOSIS — G5603 Carpal tunnel syndrome, bilateral upper limbs: Secondary | ICD-10-CM | POA: Diagnosis not present

## 2020-05-30 DIAGNOSIS — J209 Acute bronchitis, unspecified: Secondary | ICD-10-CM | POA: Diagnosis not present

## 2020-05-30 DIAGNOSIS — Z79891 Long term (current) use of opiate analgesic: Secondary | ICD-10-CM | POA: Diagnosis not present

## 2020-05-30 DIAGNOSIS — Z79899 Other long term (current) drug therapy: Secondary | ICD-10-CM | POA: Diagnosis not present

## 2020-05-30 DIAGNOSIS — R0602 Shortness of breath: Secondary | ICD-10-CM | POA: Diagnosis not present

## 2020-07-03 DIAGNOSIS — M130 Polyarthritis, unspecified: Secondary | ICD-10-CM | POA: Diagnosis not present

## 2020-07-03 DIAGNOSIS — Z79899 Other long term (current) drug therapy: Secondary | ICD-10-CM | POA: Diagnosis not present

## 2020-07-03 DIAGNOSIS — M797 Fibromyalgia: Secondary | ICD-10-CM | POA: Diagnosis not present

## 2020-07-03 DIAGNOSIS — Z79891 Long term (current) use of opiate analgesic: Secondary | ICD-10-CM | POA: Diagnosis not present

## 2020-08-08 DIAGNOSIS — M130 Polyarthritis, unspecified: Secondary | ICD-10-CM | POA: Diagnosis not present

## 2020-08-08 DIAGNOSIS — M797 Fibromyalgia: Secondary | ICD-10-CM | POA: Diagnosis not present

## 2020-08-08 DIAGNOSIS — Z79899 Other long term (current) drug therapy: Secondary | ICD-10-CM | POA: Diagnosis not present

## 2020-09-07 DIAGNOSIS — Z79899 Other long term (current) drug therapy: Secondary | ICD-10-CM | POA: Diagnosis not present

## 2020-09-07 DIAGNOSIS — J069 Acute upper respiratory infection, unspecified: Secondary | ICD-10-CM | POA: Diagnosis not present

## 2020-09-07 DIAGNOSIS — M797 Fibromyalgia: Secondary | ICD-10-CM | POA: Diagnosis not present

## 2020-09-07 DIAGNOSIS — M130 Polyarthritis, unspecified: Secondary | ICD-10-CM | POA: Diagnosis not present

## 2020-09-12 DIAGNOSIS — H04123 Dry eye syndrome of bilateral lacrimal glands: Secondary | ICD-10-CM | POA: Diagnosis not present

## 2020-10-02 DIAGNOSIS — Z79899 Other long term (current) drug therapy: Secondary | ICD-10-CM | POA: Diagnosis not present

## 2020-10-02 DIAGNOSIS — M79641 Pain in right hand: Secondary | ICD-10-CM | POA: Diagnosis not present

## 2020-11-08 DIAGNOSIS — M79642 Pain in left hand: Secondary | ICD-10-CM | POA: Diagnosis not present

## 2020-11-08 DIAGNOSIS — Z79899 Other long term (current) drug therapy: Secondary | ICD-10-CM | POA: Diagnosis not present

## 2020-11-08 DIAGNOSIS — M79641 Pain in right hand: Secondary | ICD-10-CM | POA: Diagnosis not present

## 2020-11-23 DIAGNOSIS — R109 Unspecified abdominal pain: Secondary | ICD-10-CM | POA: Diagnosis not present

## 2020-11-27 NOTE — Progress Notes (Deleted)
Referring Provider:*** Primary Care Physician:  Sinda Du, MD Primary Gastroenterologist:  Dr. Gala Romney  No chief complaint on file.   HPI:   [name] is a 59 y.o. female presenting today for ***.   History of appendix cancer 12/2011 s/p right hemicolectomy 01/2012 with no lymph node involvement, adenomatous colon polyps, chronic LLQ abdominal pain, last colonoscopy in 2016 with normal exam and recommended repeat in 5 years. Also with history of GERD and dysphagia with  EGD 09/29/13 with non-critical Schatzki's ring s/p dilation small hiatal hernia, otherwise normal. We have not seen patient since 2016.   Today:    Due for colonosocpy  Past Medical History:  Diagnosis Date  . Alopecia, unspecified   . Anxiety   . Arthritis   . Bronchitis   . Cancer [facility])    carcinoid tumor of appendix  . Carcinoid tumor of appendix   . 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 Reoperation for her lymph node dissection revealed all nodes to be absolutely negative.   . Cervicalgia   . COPD (chronic obstructive pulmonary disease) ([place])   . Depression with anxiety   . Dysphagia   . Fibromyalgia   . GERD (gastroesophageal reflux disease)   . IBS (irritable bowel syndrome)   . Interstitial cystitis   . Kidney stones   . Migraine   . PONV (postoperative nausea and vomiting)   . PTSD (post-traumatic stress disorder)   . Renal failure   . RLS (restless legs syndrome)   . Shortness of breath   . Tobacco use disorder   . UTI (lower urinary tract infection)     Past Surgical History:  Procedure Laterality Date  . ABDOMINAL HYSTERECTOMY    . ABDOMINAL SURGERY    . APPENDECTOMY  01/05/2012   with incidental finding of carcinoid  . CARDIAC CATHETERIZATION     no stents needed  . CARDIAC SURGERY    . CHOLECYSTECTOMY    . COLON RESECTION  01/30/2012    Procedure: HAND ASSISTED LAPAROSCOPIC COLON RESECTION;  Surgeon: Jamesetta So, MD;  Location: [facility];  Service: General;  Laterality: N/A;  . COLON SURGERY    . COLONOSCOPY WITH PROPOFOL N/A 09/29/2013   [id number] and colonic polyps-removed as described above Status post right hemicolectomy  . COLONOSCOPY WITH PROPOFOL N/A 03/15/2015   Procedure: COLONOSCOPY WITH PROPOFOL;  Surgeon: Daneil Dolin, MD; normal exam.  Repeat in 5 years.  . CYSTOSTOMY W/ BLADDER DILATION     x 52 [place]  . ESOPHAGEAL DILATION  01/01/2012   RMR: Normal esophagus - status post dilation with 60 F Maloney dilation, gastric polyps benign path  . ESOPHAGOGASTRODUODENOSCOPY (EGD) WITH PROPOFOL N/A 09/29/2013   [id number] appearing Schatzki's ring-status post dilation as described above. Hiatal hernia.  Marland Kitchen GIVENS CAPSULE STUDY  01/27/2012   normal  . HEMICOLECTOMY    . ileo-colonoscopy  01/01/12   RMR: multiple colonic polyps, largest polyp in proximal sigmoid colon. Tubulovillous adenoma with high grade dysplasia, tubular adenoma  . LAPAROSCOPIC APPENDECTOMY  01/05/2012   Procedure: APPENDECTOMY LAPAROSCOPIC;  Surgeon: Jamesetta So;  Location: [facility];  Service: General;  Laterality: N/A;  . MALONEY DILATION N/A 09/29/2013   Procedure: Venia Minks DILATION;  Surgeon: Daneil Dolin, MD;  Location: [facility];  Service: Endoscopy;  Laterality: N/A;  55  . PARTIAL COLECTOMY  01/30/2012  Procedure: PARTIAL COLECTOMY;  Surgeon: Jamesetta So, MD;  Location: [facility];  Service: General;  Laterality: N/A;  . POLYPECTOMY N/A 09/29/2013   Procedure: POLYPECTOMY;  Surgeon: Daneil Dolin, MD;  Location: [facility];  Service: Endoscopy;  Laterality: N/A;  . TONSILLECTOMY      Current Outpatient Medications  Medication Sig Dispense Refill  . albuterol (PROVENTIL HFA;VENTOLIN HFA) 108 (90 Base) MCG/ACT inhaler Inhale 1-2 puffs into the lungs every 6 (six) hours as needed for wheezing. 1 Inhaler 0  . albuterol (PROVENTIL) (2.5 MG/3ML)  0.083% nebulizer solution Take 2.5 mg by nebulization every 6 (six) hours as needed. Shortness of breath.    . amphetamine-dextroamphetamine (ADDERALL) 20 MG tablet Take 10 mg by mouth daily. For attention    . budesonide-formoterol (SYMBICORT) 160-4.5 MCG/ACT inhaler Inhale 2 puffs into the lungs 2 (two) times daily. 1 Inhaler 5  . celecoxib (CELEBREX) 100 MG capsule Take 1 capsule (100 mg total) by mouth 2 (two) times daily. 10 capsule 0  . cetirizine (ZYRTEC) 10 MG tablet Take 10 mg by mouth every morning.     . Cholecalciferol (VITAMIN D3) 2000 units TABS Take 1 tablet by mouth daily.    Marland Kitchen doxycycline (VIBRAMYCIN) 100 MG capsule Take 1 capsule (100 mg total) by mouth 2 (two) times daily. 20 capsule 0  . escitalopram (LEXAPRO) 20 MG tablet Take 20 mg by mouth daily.     Marland Kitchen estradiol (ESTRACE) 2 MG tablet Take 2 mg by mouth daily.    . fluticasone (FLONASE) 50 MCG/ACT nasal spray Place 2 sprays into both nostrils daily. 16 g 5  . furosemide (LASIX) 40 MG tablet Take 40 mg by mouth daily.     . lansoprazole (PREVACID) 30 MG capsule Take 30 mg by mouth daily at 12 noon.    . Melatonin 3 MG SUBL Place 1 tablet under the tongue at bedtime.    . Oxycodone HCl 10 MG TABS Take 10 mg by mouth every 4 (four) hours as needed (for pain).    . potassium chloride SA (K-DUR,KLOR-CON) 20 MEQ tablet Take 20 mEq by mouth daily.     . pramipexole (MIRAPEX) 0.5 MG tablet Take 0.5 mg by mouth at bedtime.     . predniSONE (DELTASONE) 10 MG tablet Take 3 tablets (30 mg total) by mouth daily. 2 tabs po daily x 4 days 12 tablet 0  . vitamin E 100 UNIT capsule Take 100 Units by mouth daily.     No current facility-administered medications for this visit.    Allergies as of 11/28/2020 - Review Complete 01/16/2018  Allergen Reaction Noted  . Ativan [lorazepam] Other (See Comments) 01/29/2012  . Clarithromycin Itching and Nausea And Vomiting   . Ibuprofen Other (See Comments) 01/08/2013  . Pseudoephedrine Hives   .  Reglan [metoclopramide] Other (See Comments) 06/14/2013  . Stadol [butorphanol tartrate] Nausea And Vomiting 07/19/2012  . Sulfa antibiotics Nausea And Vomiting 08/28/2011  . Toradol [ketorolac tromethamine] Other (See Comments) 07/30/2012  . Factive [gemifloxacin mesylate] Rash 08/28/2011  . Other Rash 07/19/2012  . Prochlorperazine edisylate Anxiety   . Promethazine hcl Anxiety   . Tofranil-pm Rash 01/02/2012  . Trimethobenzamide hcl Anxiety     Family History  Problem Relation Age of Onset  . Coronary artery disease Mother   . Heart attack Mother   . Pulmonary embolism Mother   . Alcohol abuse Father   . COPD Father   . Lung disease Father   . Asthma Father   .  Anesthesia problems Neg Hx   . Hypotension Neg Hx   . Malignant hyperthermia Neg Hx   . Pseudochol deficiency Neg Hx   . Other Neg Hx   . Allergic rhinitis Neg Hx   . Angioedema Neg Hx   . Atopy Neg Hx   . Eczema Neg Hx   . Immunodeficiency Neg Hx   . Urticaria Neg Hx     Social History   Socioeconomic History  . Marital status: Widowed    Spouse name: Not on file  . Number of children: 1  . Years of education: Not on file  . Highest education level: Not on file  Occupational History  . Occupation: disabled    Fish farm manager: UNEMPLOYED  Tobacco Use  . Smoking status: Current Every Day Smoker    Packs/day: 0.10    Years: 7.00    Pack years: 0.70    Types: E-cigarettes  . Smokeless tobacco: Current User  Vaping Use  . Vaping Use: Some days  Substance and Sexual Activity  . Alcohol use: No  . Drug use: No  . Sexual activity: Yes    Birth control/protection: Surgical  Other Topics Concern  . Not on file  Social History Narrative  . Not on file   Social Determinants of Health   Financial Resource Strain:   . Difficulty of Paying Living Expenses: Not on file  Food Insecurity:   . Worried About Charity fundraiser in the Last Year: Not on file  . Ran Out of Food in the Last Year: Not on file   Transportation Needs:   . Lack of Transportation (Medical): Not on file  . Lack of Transportation (Non-Medical): Not on file  Physical Activity:   . Days of Exercise per Week: Not on file  . Minutes of Exercise per Session: Not on file  Stress:   . Feeling of Stress : Not on file  Social Connections:   . Frequency of Communication with Friends and Family: Not on file  . Frequency of Social Gatherings with Friends and Family: Not on file  . Attends Religious Services: Not on file  . Active Member of Clubs or Organizations: Not on file  . Attends Archivist Meetings: Not on file  . Marital Status: Not on file  Intimate Partner Violence:   . Fear of Current or Ex-Partner: Not on file  . Emotionally Abused: Not on file  . Physically Abused: Not on file  . Sexually Abused: Not on file    Review of Systems: Gen: Denies any fever, chills, fatigue, weight loss, lack of appetite.  CV: Denies chest pain, heart palpitations, peripheral edema, syncope.  Resp: Denies shortness of breath at rest or with exertion. Denies wheezing or cough.  GI: Denies dysphagia or odynophagia. Denies jaundice, hematemesis, fecal incontinence. GU : Denies urinary burning, urinary frequency, urinary hesitancy MS: Denies joint pain, muscle weakness, cramps, or limitation of movement.  Derm: Denies rash, itching, dry skin Psych: Denies depression, anxiety, memory loss, and confusion Heme: Denies bruising, bleeding, and enlarged lymph nodes.  Physical Exam: LMP 12/29/1993  General:   Alert and oriented. Pleasant and cooperative. Well-nourished and well-developed.  Head:  Normocephalic and atraumatic. Eyes:  Without icterus, sclera clear and conjunctiva pink.  Ears:  Normal auditory acuity. Nose:  No deformity, discharge,  or lesions. Mouth:  No deformity or lesions, oral mucosa pink.  Neck:  Supple, without mass or thyromegaly. Lungs:  Clear to auscultation bilaterally. No wheezes, rales, or  rhonchi. No distress.  Heart:  S1, S2 present without murmurs appreciated.  Abdomen:  +BS, soft, non-tender and non-distended. No HSM noted. No guarding or rebound. No masses appreciated.  Rectal:  Deferred  Msk:  Symmetrical without gross deformities. Normal posture. Pulses:  Normal pulses noted. Extremities:  Without clubbing or edema. Neurologic:  Alert and  oriented x4;  grossly normal neurologically. Skin:  Intact without significant lesions or rashes. Cervical Nodes:  No significant cervical adenopathy. Psych:  Alert and cooperative. Normal mood and affect.

## 2020-11-28 ENCOUNTER — Ambulatory Visit: Payer: Medicare Other | Admitting: Gastroenterology

## 2020-12-06 DIAGNOSIS — M25552 Pain in left hip: Secondary | ICD-10-CM | POA: Diagnosis not present

## 2020-12-06 DIAGNOSIS — Z79899 Other long term (current) drug therapy: Secondary | ICD-10-CM | POA: Diagnosis not present

## 2021-01-06 ENCOUNTER — Encounter: Payer: Self-pay | Admitting: Gastroenterology

## 2021-01-06 NOTE — Progress Notes (Deleted)
Primary Care Physician:  Sinda Du, MD Primary Gastroenterologist:  Dr. Gala Romney No chief complaint on file.   HPI:   [name] is a 60 y.o. 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  Last colonoscopy in March 2016 with normal exam, recommended repeat in 5 years. Last EGD in October 2014 with noncritical Schatzki's ring s/p dilation, hiatal hernia.  We have not seen patient in office since February 2016. She has no showed or canceled multiple appointments.    Today:    Due for colonosocpy  Past Medical History:  Diagnosis Date  . Alopecia, unspecified   . Anxiety   . Arthritis   . Bronchitis   . Cancer [facility])    carcinoid tumor of appendix  . Carcinoid tumor of appendix   . 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 Reoperation for her lymph node dissection revealed all nodes to be absolutely negative.   . Cervicalgia   . COPD (chronic obstructive pulmonary disease) ([place])   . Depression with anxiety   . Dysphagia   . Fibromyalgia   . GERD (gastroesophageal reflux disease)   . IBS (irritable bowel syndrome)   . Interstitial cystitis   . Kidney stones   . Migraine   . PONV (postoperative nausea and vomiting)   . PTSD (post-traumatic stress disorder)   . Renal failure   . RLS (restless legs syndrome)   . Shortness of breath   . Tobacco use disorder   . UTI (lower urinary tract infection)      Past Surgical History:  Procedure Laterality Date  . ABDOMINAL HYSTERECTOMY    . ABDOMINAL SURGERY    . APPENDECTOMY  01/05/2012   with incidental finding of carcinoid  . CARDIAC CATHETERIZATION     no stents needed  . CARDIAC SURGERY    . CHOLECYSTECTOMY    . COLON RESECTION  01/30/2012   Procedure: HAND ASSISTED LAPAROSCOPIC COLON RESECTION;  Surgeon: Jamesetta So, MD;  Location: [facility];  Service: General;  Laterality: N/A;  . COLON SURGERY    . COLONOSCOPY WITH PROPOFOL N/A 09/29/2013   [id number] and colonic polyps-removed as described above Status post right hemicolectomy  . COLONOSCOPY WITH PROPOFOL N/A 03/15/2015   Procedure: COLONOSCOPY WITH PROPOFOL;  Surgeon: Daneil Dolin, MD; normal exam.  Repeat in 5 years.  . CYSTOSTOMY W/ BLADDER DILATION     x 52 [place]  . ESOPHAGEAL DILATION  01/01/2012   RMR: Normal esophagus - status post dilation with 47 F [place]oney dilation, gastric polyps benign path  . ESOPHAGOGASTRODUODENOSCOPY (EGD) WITH PROPOFOL N/A 09/29/2013   [id number] appearing Schatzki's ring-status post dilation as described above. Hiatal hernia.  Marland Kitchen GIVENS CAPSULE STUDY  01/27/2012   normal  . HEMICOLECTOMY    . ileo-colonoscopy  01/01/12   RMR: multiple colonic polyps, largest polyp in proximal sigmoid colon. Tubulovillous adenoma with high grade dysplasia, tubular adenoma  . LAPAROSCOPIC APPENDECTOMY  01/05/2012   Procedure: APPENDECTOMY LAPAROSCOPIC;  Surgeon: Jamesetta So;  Location:  [facility];  Service: General;  Laterality: N/A;  . MALONEY DILATION N/A 09/29/2013   Procedure: Venia Minks DILATION;  Surgeon: Daneil Dolin, MD;  Location: [facility];  Service: Endoscopy;  Laterality: N/A;  55  . PARTIAL COLECTOMY  01/30/2012   Procedure: PARTIAL COLECTOMY;  Surgeon: Jamesetta So, MD;  Location: [facility];  Service: General;  Laterality: N/A;  . POLYPECTOMY N/A 09/29/2013   Procedure: POLYPECTOMY;  Surgeon: Daneil Dolin, MD;  Location: [facility];  Service: Endoscopy;   Laterality: N/A;  . TONSILLECTOMY      Current Outpatient Medications  Medication Sig Dispense Refill  . albuterol (PROVENTIL HFA;VENTOLIN HFA) 108 (90 Base) MCG/ACT inhaler Inhale 1-2 puffs into the lungs every 6 (six) hours as needed for wheezing. 1 Inhaler 0  . albuterol (PROVENTIL) (2.5 MG/3ML) 0.083% nebulizer solution Take 2.5 mg by nebulization every 6 (six) hours as needed. Shortness of breath.    . amphetamine-dextroamphetamine (ADDERALL) 20 MG tablet Take 10 mg by mouth daily. For attention    . budesonide-formoterol (SYMBICORT) 160-4.5 MCG/ACT inhaler Inhale 2 puffs into the lungs 2 (two) times daily. 1 Inhaler 5  . celecoxib (CELEBREX) 100 MG capsule Take 1 capsule (100 mg total) by mouth 2 (two) times daily. 10 capsule 0  . cetirizine (ZYRTEC) 10 MG tablet Take 10 mg by mouth every morning.     . Cholecalciferol (VITAMIN D3) 2000 units TABS Take 1 tablet by mouth daily.    Marland Kitchen doxycycline (VIBRAMYCIN) 100 MG capsule Take 1 capsule (100 mg total) by mouth 2 (two) times daily. 20 capsule 0  . escitalopram (LEXAPRO) 20 MG tablet Take 20 mg by mouth daily.     Marland Kitchen estradiol (ESTRACE) 2 MG tablet Take 2 mg by mouth daily.    . fluticasone (FLONASE) 50 MCG/ACT nasal spray Place 2 sprays into both nostrils daily. 16 g 5  . furosemide (LASIX) 40 MG tablet Take 40 mg by mouth daily.     . lansoprazole (PREVACID) 30 MG capsule Take 30 mg by mouth daily at 12 noon.    . Melatonin 3 MG SUBL Place 1 tablet under the tongue at bedtime.    . Oxycodone HCl 10 MG TABS Take 10 mg by mouth every 4 (four) hours as needed (for pain).    . potassium chloride SA (K-DUR,KLOR-CON) 20 MEQ tablet Take 20 mEq by mouth daily.     . pramipexole (MIRAPEX) 0.5 MG tablet Take 0.5 mg by mouth at bedtime.     . predniSONE (DELTASONE) 10 MG tablet Take 3 tablets (30 mg total) by mouth daily. 2 tabs po daily x 4 days 12 tablet 0  . vitamin E 100 UNIT capsule Take 100 Units by mouth daily.     No current  facility-administered medications for this visit.    Allergies as of 01/07/2021 - Review Complete 01/16/2018  Allergen Reaction Noted  . Ativan [lorazepam] Other (See Comments) 01/29/2012  . Clarithromycin Itching and Nausea And Vomiting   . Ibuprofen Other (See Comments) 01/08/2013  . Pseudoephedrine Hives   . Reglan [metoclopramide] Other (See Comments) 06/14/2013  . Stadol [butorphanol tartrate] Nausea And Vomiting 07/19/2012  . Sulfa antibiotics Nausea And Vomiting 08/28/2011  . Toradol [ketorolac tromethamine] Other (See Comments) 07/30/2012  . Factive [gemifloxacin mesylate] Rash 08/28/2011  . Other Rash 07/19/2012  . Prochlorperazine edisylate Anxiety   . Promethazine hcl Anxiety   . Tofranil-pm Rash 01/02/2012  . Trimethobenzamide hcl Anxiety     Family History  Problem  Relation Age of Onset  . Coronary artery disease Mother   . Heart attack Mother   . Pulmonary embolism Mother   . Alcohol abuse Father   . COPD Father   . Lung disease Father   . Asthma Father   . Anesthesia problems Neg Hx   . Hypotension Neg Hx   . Malignant hyperthermia Neg Hx   . Pseudochol deficiency Neg Hx   . Other Neg Hx   . Allergic rhinitis Neg Hx   . Angioedema Neg Hx   . Atopy Neg Hx   . Eczema Neg Hx   . Immunodeficiency Neg Hx   . Urticaria Neg Hx     Social History   Socioeconomic History  . Marital status: Widowed    Spouse name: Not on file  . Number of children: 1  . Years of education: Not on file  . Highest education level: Not on file  Occupational History  . Occupation: disabled    Fish farm manager: UNEMPLOYED  Tobacco Use  . Smoking status: Current Every Day Smoker    Packs/day: 0.10    Years: 7.00    Pack years: 0.70    Types: E-cigarettes  . Smokeless tobacco: Current User  Vaping Use  . Vaping Use: Some days  Substance and Sexual Activity  . Alcohol use: No  . Drug use: No  . Sexual activity: Yes    Birth control/protection: Surgical  Other Topics Concern   . Not on file  Social History Narrative  . Not on file   Social Determinants of Health   Financial Resource Strain: Not on file  Food Insecurity: Not on file  Transportation Needs: Not on file  Physical Activity: Not on file  Stress: Not on file  Social Connections: Not on file  Intimate Partner Violence: Not on file    Review of Systems: Gen: Denies any fever, chills, fatigue, weight loss, lack of appetite.  CV: Denies chest pain, heart palpitations, peripheral edema, syncope.  Resp: Denies shortness of breath at rest or with exertion. Denies wheezing or cough.  GI: Denies dysphagia or odynophagia. Denies jaundice, hematemesis, fecal incontinence. GU : Denies urinary burning, urinary frequency, urinary hesitancy MS: Denies joint pain, muscle weakness, cramps, or limitation of movement.  Derm: Denies rash, itching, dry skin Psych: Denies depression, anxiety, memory loss, and confusion Heme: Denies bruising, bleeding, and enlarged lymph nodes.  Physical Exam: LMP 12/29/1993  General:   Alert and oriented. Pleasant and cooperative. Well-nourished and well-developed.  Head:  Normocephalic and atraumatic. Eyes:  Without icterus, sclera clear and conjunctiva pink.  Ears:  Normal auditory acuity. Nose:  No deformity, discharge,  or lesions. Mouth:  No deformity or lesions, oral mucosa pink.  Neck:  Supple, without mass or thyromegaly. Lungs:  Clear to auscultation bilaterally. No wheezes, rales, or rhonchi. No distress.  Heart:  S1, S2 present without murmurs appreciated.  Abdomen:  +BS, soft, non-tender and non-distended. No HSM noted. No guarding or rebound. No masses appreciated.  Rectal:  Deferred  Msk:  Symmetrical without gross deformities. Normal posture. Pulses:  Normal pulses noted. Extremities:  Without clubbing or edema. Neurologic:  Alert and  oriented x4;  grossly normal neurologically. Skin:  Intact without significant lesions or rashes. Cervical Nodes:  No  significant cervical adenopathy. Psych:  Alert and cooperative. Normal mood and affect.

## 2021-01-07 ENCOUNTER — Ambulatory Visit: Payer: Medicare Other | Admitting: Gastroenterology

## 2021-01-07 ENCOUNTER — Telehealth: Payer: Self-pay | Admitting: Internal Medicine

## 2021-01-07 NOTE — Telephone Encounter (Signed)
Patient was a no show and has been several times.  Letter sent in the past

## 2021-01-07 NOTE — Telephone Encounter (Signed)
Reviewed

## 2021-01-10 DIAGNOSIS — Z79899 Other long term (current) drug therapy: Secondary | ICD-10-CM | POA: Diagnosis not present

## 2021-01-10 DIAGNOSIS — Z9189 Other specified personal risk factors, not elsewhere classified: Secondary | ICD-10-CM | POA: Diagnosis not present

## 2021-01-10 DIAGNOSIS — U071 COVID-19: Secondary | ICD-10-CM | POA: Diagnosis not present

## 2021-09-10 ENCOUNTER — Other Ambulatory Visit (HOSPITAL_COMMUNITY): Payer: Self-pay | Admitting: Pulmonary Disease

## 2021-09-10 DIAGNOSIS — Z1231 Encounter for screening mammogram for malignant neoplasm of breast: Secondary | ICD-10-CM

## 2021-09-13 ENCOUNTER — Ambulatory Visit (HOSPITAL_COMMUNITY): Payer: Medicare Other

## 2021-09-16 ENCOUNTER — Ambulatory Visit (HOSPITAL_COMMUNITY)
Admission: RE | Admit: 2021-09-16 | Discharge: 2021-09-16 | Disposition: A | Discharge status: Home / Self Care | Payer: Medicare Other | Source: Ambulatory Visit | Attending: Pulmonary Disease | Admitting: Pulmonary Disease

## 2021-09-16 ENCOUNTER — Other Ambulatory Visit: Payer: Self-pay

## 2021-09-16 ENCOUNTER — Ambulatory Visit (HOSPITAL_COMMUNITY): Payer: Medicare Other

## 2021-09-16 DIAGNOSIS — Z1231 Encounter for screening mammogram for malignant neoplasm of breast: Secondary | ICD-10-CM | POA: Insufficient documentation

## 2021-09-20 ENCOUNTER — Other Ambulatory Visit (HOSPITAL_COMMUNITY): Payer: Self-pay | Admitting: Pulmonary Disease

## 2021-09-20 DIAGNOSIS — N63 Unspecified lump in unspecified breast: Secondary | ICD-10-CM

## 2021-09-24 ENCOUNTER — Ambulatory Visit (HOSPITAL_COMMUNITY)
Admission: RE | Admit: 2021-09-24 | Discharge: 2021-09-24 | Disposition: A | Discharge status: Home / Self Care | Payer: Medicare Other | Source: Ambulatory Visit | Attending: Pulmonary Disease | Admitting: Pulmonary Disease

## 2021-09-24 ENCOUNTER — Other Ambulatory Visit: Payer: Self-pay

## 2021-09-24 DIAGNOSIS — N63 Unspecified lump in unspecified breast: Secondary | ICD-10-CM

## 2021-09-24 DIAGNOSIS — N6321 Unspecified lump in the left breast, upper outer quadrant: Secondary | ICD-10-CM | POA: Insufficient documentation

## 2022-01-06 ENCOUNTER — Telehealth: Payer: Self-pay | Admitting: Internal Medicine

## 2022-01-06 NOTE — Telephone Encounter (Signed)
Patient called and said she was very overdue for a tcs, she has not been here in many years,  does she need an office visit or a referral to basically be a new patient?

## 2022-01-06 NOTE — Telephone Encounter (Signed)
Pt had last TCS with Propofol with Dr. Gala Romney 03/15/2015.  I would offer pt nurse visit by telephone please.

## 2022-02-27 ENCOUNTER — Ambulatory Visit (INDEPENDENT_AMBULATORY_CARE_PROVIDER_SITE_OTHER): Payer: Self-pay | Admitting: *Deleted

## 2022-02-27 ENCOUNTER — Other Ambulatory Visit: Payer: Self-pay

## 2022-02-27 VITALS — Ht 62.0 in | Wt 166.0 lb

## 2022-02-27 DIAGNOSIS — Z86010 Personal history of colonic polyps: Secondary | ICD-10-CM

## 2022-02-27 NOTE — Progress Notes (Signed)
Pt informed me that she would like to have EGD and TCS.  Issues with Jerrye Bushy. Scheduled virtual visit with Aliene Altes, PA-C for 03/05/2022 at 1:00. ?

## 2022-02-27 NOTE — Progress Notes (Signed)
Gastroenterology Pre-Procedure Review ? ?Request Date: 02/27/2022 ?Requesting Physician: 5 year recall, Last TCS done 03/15/2015 by Dr. Gala Romney, normal colon, status post right hemicolectomy, hx of colonic adenoma, hx appendiceal carcinoid ? ?PATIENT REVIEW QUESTIONS: The patient responded to the following health history questions as indicated:   ? ?1. Diabetes Melitis: no ?2. Joint replacements in the past 12 months: no ?3. Major health problems in the past 3 months: no ?4. Has an artificial valve or MVP: yes, MVP ?5. Has a defibrillator: no ?6. Has been advised in past to take antibiotics in advance of a procedure like teeth cleaning: yes, but doesn't have to do it anymore ?7. Family history of colon cancer: yes, sister: age 40 ?31. Alcohol Use: no ?9. Illicit drug Use: no ?10. History of sleep apnea: no ?11. History of coronary artery or other vascular stents placed within the last 12 months: no ?12. History of any prior anesthesia complications: yes, legs kept jumping during procedure ?13. Body mass index is 30.36 kg/m?. ?   ?MEDICATIONS & ALLERGIES:    ?Patient reports the following regarding taking any blood thinners:   ?Plavix? no ?Aspirin? no ?Coumadin? no ?Brilinta? no ?Xarelto? no ?Eliquis? no ?Pradaxa? no ?Savaysa? no ?Effient? no ? ?Patient confirms/reports the following medications:  ?Current Outpatient Medications  ?Medication Sig Dispense Refill  ? albuterol (PROVENTIL HFA;VENTOLIN HFA) 108 (90 Base) MCG/ACT inhaler Inhale 1-2 puffs into the lungs every 6 (six) hours as needed for wheezing. 1 Inhaler 0  ? amphetamine-dextroamphetamine (ADDERALL) 20 MG tablet Take 10 mg by mouth daily. For attention    ? cetirizine (ZYRTEC) 10 MG tablet Take 10 mg by mouth every morning.     ? Cholecalciferol (VITAMIN D3) 2000 units TABS Take 1 tablet by mouth daily.    ? estradiol (ESTRACE) 2 MG tablet Take 2 mg by mouth daily.    ? fluticasone (FLONASE) 50 MCG/ACT nasal spray Place 2 sprays into both nostrils daily. 16 g 5   ? furosemide (LASIX) 40 MG tablet Take 40 mg by mouth daily.     ? omeprazole (PRILOSEC) 40 MG capsule Take 40 mg by mouth daily.    ? Oxycodone HCl 10 MG TABS Take 10 mg by mouth every 6 (six) hours.    ? potassium chloride SA (K-DUR,KLOR-CON) 20 MEQ tablet Take 20 mEq by mouth daily.     ? pramipexole (MIRAPEX) 0.5 MG tablet Take 0.5 mg by mouth at bedtime.     ? UNABLE TO FIND Take 1 capsule by mouth daily. Med Name: Apolline weight loss medicine    ? ?No current facility-administered medications for this visit.  ? ? ?Patient confirms/reports the following allergies:  ?Allergies  ?Allergen Reactions  ? Ativan [Lorazepam] Other (See Comments)  ?  Hallucinations  ? Clarithromycin Itching and Nausea And Vomiting  ? Ibuprofen Other (See Comments)  ?  GI bleed  ? Pseudoephedrine Hives  ? Reglan [Metoclopramide] Other (See Comments)  ?  Bad stomach pain   ? Stadol [Butorphanol Tartrate] Nausea And Vomiting  ? Sulfa Antibiotics Nausea And Vomiting  ? Toradol [Ketorolac Tromethamine] Other (See Comments)  ?  abd pain ?  ? Factive [Gemifloxacin Mesylate] Rash  ? Other Rash  ?  All decongestants ?**can take guafenisin  ? Prochlorperazine Edisylate Anxiety  ? Promethazine Hcl Anxiety  ? Tofranil-Pm Rash  ? Trimethobenzamide Hcl Anxiety  ? ? ?No orders of the defined types were placed in this encounter. ? ? ?AUTHORIZATION INFORMATION ?Primary Insurance: ,  ID #: ,  Group #:  ?Pre-Cert / Auth required:  ?Pre-Cert / Auth #:  ? ?Secondary Insurance: ,  ID #: ,  Group #:  ?Pre-Cert / Auth required:  ?Pre-Cert / Auth #:  ? ?SCHEDULE INFORMATION: ?Procedure has been scheduled as follows:  ?Date: , Time:   ?Location: [facility] with Dr. Gala Romney ? ?This Gastroenterology Pre-Precedure Review Form is being routed to the following provider(s): Roseanne Kaufman, NP ?  ?

## 2022-03-03 NOTE — Progress Notes (Signed)
Primary Care Physician:  Sinda Du, MD  Primary GI: Dr. Gala Romney  Patient Location: Home   Provider Location: [facility] office   Reason for Visit: Schedule colonoscopy, dysphagia, GERD   Persons present on the virtual encounter, with roles: Aliene Altes, PA-C (provider), [name] (patient)   Total time (minutes) spent on medical discussion: 12 minutes  Virtual Visit via Video Note Due to COVID-19, visit is conducted virtually and was requested by patient.   I connected with [name] on 03/05/22 at  1:00 PM EST by video and verified that I am speaking with the correct person using two identifiers.   I discussed the limitations, risks, security and privacy concerns of performing an evaluation and management service by video and the availability of in person appointments. I also discussed with the patient that there may be a patient responsible charge related to this service. The patient expressed understanding and agreed to proceed.  Chief Complaint  Patient presents with   Gastroesophageal Reflux    Consult for TCS and EGD     History of Present Illness: [name] is a 61 y.o. female presenting today to discuss scheduling EGD and colonoscopy.   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  Last colonoscopy in March 2016 with normal exam, recommended repeat in 5 years. Last EGD in October 2014 with noncritical Schatzki's ring s/p dilation, hiatal hernia.    We have not seen patient in office since February 2016. She has no showed or canceled multiple appointments.    She called the office  in January 2023 stating she was overdue for colonoscopy.  She was scheduled for a nurse triage visit, but requested to have an EGD due to GERD issues and was a scheduled for an office visit.   Today:   GERD: Well controlled on omeprazole 40 mg daily. No nausea or vomiting.   Solid food dysphagia. Few days a week.  Started back about 6 months ago. Prior dilations helpful.  No regurgitation.  Items will pass when drinking liquids.  No constipation. Occasional diarrhea, but nothing routine. No brpbr or melena. No abdominal pain. No unintentional weight loss.   Rare ibuprofen.  Oxycodone for fibromyalgia and RA.    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 Reoperation for her lymph node dissection revealed all nodes to be absolutely negative.    Cervicalgia    COPD (chronic obstructive pulmonary disease) (HCC)    Depression with anxiety    Dysphagia    Fibromyalgia    GERD (gastroesophageal reflux disease)    IBS (irritable bowel syndrome)    Interstitial cystitis    Kidney stones    Migraine    PONV (postoperative nausea and vomiting)    PTSD (post-traumatic stress disorder)    Renal failure  RLS (restless legs syndrome)    Shortness of breath    Tobacco use disorder    UTI (lower urinary tract infection)      Past Surgical History:  Procedure Laterality Date   ABDOMINAL HYSTERECTOMY     ABDOMINAL SURGERY     APPENDECTOMY  01/05/2012   with incidental finding of carcinoid   CARDIAC CATHETERIZATION     no stents needed   CARDIAC SURGERY     CHOLECYSTECTOMY     COLON RESECTION  01/30/2012   Procedure: HAND ASSISTED LAPAROSCOPIC COLON RESECTION;  Surgeon:  Jamesetta So, MD;  Location: [facility];  Service: General;  Laterality: N/A;   COLON SURGERY     COLONOSCOPY WITH PROPOFOL N/A 09/29/2013   ZOX:WRUEAV and colonic polyps-removed as described above Status post right hemicolectomy   COLONOSCOPY WITH PROPOFOL N/A 03/15/2015   Procedure: COLONOSCOPY WITH PROPOFOL;  Surgeon: Daneil Dolin, MD; normal exam.  Repeat in 5 years.   CYSTOSTOMY W/ BLADDER DILATION     x 52    ESOPHAGEAL DILATION  01/01/2012   RMR: Normal esophagus - status post dilation with 33 F Maloney dilation, gastric polyps benign path   ESOPHAGOGASTRODUODENOSCOPY (EGD) WITH PROPOFOL N/A 09/29/2013   WUJ:[id number] appearing Schatzki's ring-status post dilation as described above. Hiatal hernia.   GIVENS CAPSULE STUDY  01/27/2012   normal   HEMICOLECTOMY     ileo-colonoscopy  01/01/12   RMR: multiple colonic polyps, largest polyp in proximal sigmoid colon. Tubulovillous adenoma with high grade dysplasia, tubular adenoma   LAPAROSCOPIC APPENDECTOMY  01/05/2012   Procedure: APPENDECTOMY LAPAROSCOPIC;  Surgeon: Jamesetta So;  Location: [facility];  Service: General;  Laterality: N/A;   MALONEY DILATION N/A 09/29/2013   Procedure: Venia Minks DILATION;  Surgeon: Daneil Dolin, MD;  Location: [facility];  Service: Endoscopy;  Laterality: N/A;  55   PARTIAL COLECTOMY  01/30/2012   Procedure: PARTIAL COLECTOMY;  Surgeon: Jamesetta So, MD;  Location: [facility];  Service: General;  Laterality: N/A;   POLYPECTOMY N/A 09/29/2013   Procedure: POLYPECTOMY;  Surgeon: Daneil Dolin, MD;  Location: [facility];  Service: Endoscopy;  Laterality: N/A;   TONSILLECTOMY       Current Meds  Medication Sig   albuterol (PROVENTIL HFA;VENTOLIN HFA) 108 (90 Base) MCG/ACT inhaler Inhale 1-2 puffs into the lungs every 6 (six) hours as needed for wheezing.   amphetamine-dextroamphetamine (ADDERALL) 20 MG tablet Take 5 mg by mouth daily. For attention   cetirizine (ZYRTEC) 10 MG tablet Take 10 mg by mouth every  morning.    Cholecalciferol (VITAMIN D3) 2000 units TABS Take 1 tablet by mouth daily.   doxycycline (VIBRA-TABS) 100 MG tablet Take 100 mg by mouth daily at 6 (six) AM.   estradiol (ESTRACE) 2 MG tablet Take 2 mg by mouth daily.   fluticasone (FLONASE) 50 MCG/ACT nasal spray Place 2 sprays into both nostrils daily.   furosemide (LASIX) 40 MG tablet Take 40 mg by mouth daily.    ibuprofen (ADVIL) 100 MG tablet Take 100 mg by mouth as needed for fever.   omeprazole (PRILOSEC) 40 MG capsule Take 40 mg by mouth daily.   Oxycodone HCl 10 MG TABS Take 10 mg by mouth every 6 (six) hours.   potassium chloride SA (K-DUR,KLOR-CON) 20 MEQ tablet Take 20 mEq by mouth daily.    pramipexole (MIRAPEX) 0.5 MG tablet Take 0.5 mg by mouth at bedtime.    UNABLE TO FIND Take 1 capsule by mouth daily.  Med Name: Apolline weight loss medicine     Family History  Problem Relation Age of Onset   Coronary artery disease Mother    Heart attack Mother    Pulmonary embolism Mother    Alcohol abuse Father    COPD Father    Lung disease Father    Asthma Father    Colon cancer Sister        1   Anesthesia problems Neg Hx    Hypotension Neg Hx    Malignant hyperthermia Neg Hx    Pseudochol deficiency Neg Hx    Other Neg Hx    Allergic rhinitis Neg Hx    Angioedema Neg Hx    Atopy Neg Hx    Eczema Neg Hx    Immunodeficiency Neg Hx    Urticaria Neg Hx     Social History   Socioeconomic History   Marital status: Widowed    Spouse name: Not on file   Number of children: 1   Years of education: Not on file   Highest education level: Not on file  Occupational History   Occupation: disabled    Employer: UNEMPLOYED  Tobacco Use   Smoking status: Former    Packs/day: 0.10    Years: 7.00    Pack years: 0.70    Types: E-cigarettes, Cigarettes   Smokeless tobacco: Current   Tobacco comments:    Vapes occasionally  Vaping Use   Vaping Use: Some days  Substance and Sexual Activity   Alcohol use: No    Drug use: No   Sexual activity: Yes    Birth control/protection: Surgical  Other Topics Concern   Not on file  Social History Narrative   Not on file   Social Determinants of Health   Financial Resource Strain: Not on file  Food Insecurity: Not on file  Transportation Needs: Not on file  Physical Activity: Not on file  Stress: Not on file  Social Connections: Not on file       Review of Systems: Gen: Denies fever, chills, cold or flulike symptoms, presyncope, syncope. CV: Denies chest pain, palpitations. Resp: Denies dyspnea or cough.   GI: see HPI Derm: Denies rash. Psych: Denies depression, anxiety. Heme:   Observations/Objective: No distress. Alert and oriented. Pleasant. Well nourished. Normal mood and affect. Unable to perform complete physical exam due to video encounter.    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  Also with complaint of dysphagia.  History of adenomatous colon polyps:  Overdue for surveillance colonoscopy.  Last colonoscopy in March 2016 with normal exam, recommended repeat in 5 years.  She does have history of adenomatous polyps including a tubulovillous adenoma with high-grade dysplasia in 2013.  Family history significant for sister with colon cancer around age 18.  Currently without significant GI symptoms.  No alarm symptoms.  Dysphagia: History of dysphagia in the setting of Schatzki's ring s/p dilation in 2014.  She reports recurrent solid food dysphagia over the last 6 months occurring a few days a week.  No regurgitation of foods.  Query recurrent Schatzki's ring vs esophageal ring or web. Less likely malignancy.  She will need EGD for  possible dilation for further evaluation and therapeutic intervention.  GERD: Chronic.  Well-controlled on omeprazole 40 mg  daily.   Plan:  Proceed with EGD with possible esophageal dilation and colonoscopy with propofol with Dr. Gala Romney in the near future.   The risks, benefits, and alternatives have been discussed with the patient in detail. The patient states understanding and desires to proceed. ASA 3 Avoid tough textures, chopped meats finely, eat slowly, take small bites, chew thoroughly, drink only liquids throughout meals. Recommended proceeding to the emergency room if something gets stuck in her esophagus and would not come up or go down. Continue omeprazole 40 mg daily 30 minutes before breakfast. Follow-up after procedures.      I discussed the assessment and treatment plan with the patient. The patient was provided an opportunity to ask questions and all were answered. The patient agreed with the plan and demonstrated an understanding of the instructions.   The patient was advised to call back or seek an in-person evaluation if the symptoms worsen or if the condition fails to improve as anticipated.  I provided 12 minutes of video-face-to-face time during this encounter.  Aliene Altes, PA-C [facility]

## 2022-03-05 ENCOUNTER — Telehealth (INDEPENDENT_AMBULATORY_CARE_PROVIDER_SITE_OTHER): Payer: Medicare Other | Admitting: Gastroenterology

## 2022-03-05 ENCOUNTER — Encounter: Payer: Self-pay | Admitting: Gastroenterology

## 2022-03-05 ENCOUNTER — Telehealth: Payer: Self-pay | Admitting: *Deleted

## 2022-03-05 ENCOUNTER — Other Ambulatory Visit: Payer: Self-pay

## 2022-03-05 VITALS — Ht 61.0 in | Wt 159.0 lb

## 2022-03-05 DIAGNOSIS — Z86010 Personal history of colonic polyps: Secondary | ICD-10-CM | POA: Diagnosis not present

## 2022-03-05 DIAGNOSIS — R1310 Dysphagia, unspecified: Secondary | ICD-10-CM | POA: Diagnosis not present

## 2022-03-05 DIAGNOSIS — K219 Gastro-esophageal reflux disease without esophagitis: Secondary | ICD-10-CM

## 2022-03-05 NOTE — Telephone Encounter (Signed)
[name], you are scheduled for a virtual visit with your provider today.  Just as we do with appointments in the office, we must obtain your consent to participate.  Your consent will be active for this visit and any virtual visit you may have with one of our providers in the next 365 days.  If you have a MyChart account, I can also send a copy of this consent to you electronically.  All virtual visits are billed to your insurance company just like a traditional visit in the office.  As this is a virtual visit, video technology does not allow for your provider to perform a traditional examination.  This may limit your provider's ability to fully assess your condition.  If your provider identifies any concerns that need to be evaluated in person or the need to arrange testing such as labs, EKG, etc, we will make arrangements to do so.  Although advances in technology are sophisticated, we cannot ensure that it will always work on either your end or our end.  If the connection with a video visit is poor, we may have to switch to a telephone visit.  With either a video or telephone visit, we are not always able to ensure that we have a secure connection.   I need to obtain your verbal consent now.   Are you willing to proceed with your visit today?  ?

## 2022-03-05 NOTE — Telephone Encounter (Signed)
Pt consented to a virtual visit. 

## 2022-03-05 NOTE — Patient Instructions (Signed)
We will arrange for you to have a colonoscopy and upper endoscopy with possible dilation of your esophagus in the near future with Dr. Gala Romney. ? ?Continue taking omeprazole 40 mg daily 30 minutes before breakfast. ? ?For now, avoid tough textures, chopped meats finely, eat slowly, take small bites, chew thoroughly, and drink plenty of liquids throughout your meals. ? ?If something gets stuck in your esophagus and will not come up or go down, proceed to the emergency room. ? ?It was nice meeting you today! ? ?Aliene Altes, PA-C ?[facility] ? ?

## 2022-03-06 ENCOUNTER — Telehealth: Payer: Self-pay

## 2022-03-06 ENCOUNTER — Other Ambulatory Visit: Payer: Self-pay

## 2022-03-06 MED ORDER — CLENPIQ 10-3.5-12 MG-GM -GM/160ML PO SOLN: 1.0000 | Freq: Once | ORAL | 0 refills | Status: AC

## 2022-03-06 NOTE — Telephone Encounter (Signed)
Called pt, TCS/EGD/-/+DIL w/Propofol ASA 3 w/Dr. Gala Romney scheduled for 04/03/22 at 10:00am. Rx for prep sent to pharmacy. Orders entered. ? ?PA for TCS/EGD/DIL submitted via [facility] website. PA# [id number], valid 04/03/22-07/02/22. ?

## 2022-03-06 NOTE — Telephone Encounter (Signed)
Pre-op appt 04/01/22. Appt letter mailed with procedure instructions. ?

## 2022-03-18 ENCOUNTER — Emergency Department (HOSPITAL_COMMUNITY)
Admission: EM | Admit: 2022-03-18 | Discharge: 2022-03-18 | Disposition: A | Discharge status: Home / Self Care | Payer: Medicare Other | Attending: Emergency Medicine | Admitting: Emergency Medicine

## 2022-03-18 ENCOUNTER — Other Ambulatory Visit: Payer: Self-pay

## 2022-03-18 ENCOUNTER — Emergency Department (HOSPITAL_COMMUNITY): Payer: Medicare Other

## 2022-03-18 ENCOUNTER — Encounter (HOSPITAL_COMMUNITY): Payer: Self-pay

## 2022-03-18 DIAGNOSIS — M62838 Other muscle spasm: Secondary | ICD-10-CM | POA: Diagnosis not present

## 2022-03-18 DIAGNOSIS — M542 Cervicalgia: Secondary | ICD-10-CM | POA: Insufficient documentation

## 2022-03-18 MED ORDER — HYDROMORPHONE HCL 1 MG/ML IJ SOLN
1.0000 mg | Freq: Once | INTRAMUSCULAR | Status: AC
  Administered 2022-03-18: 1 mg via INTRAMUSCULAR
  Filled 2022-03-18: qty 1

## 2022-03-18 MED ORDER — PREDNISONE 10 MG PO TABS: ORAL_TABLET | ORAL | 0 refills | Status: DC

## 2022-03-18 MED ORDER — DIPHENHYDRAMINE HCL 50 MG/ML IJ SOLN
12.5000 mg | Freq: Once | INTRAMUSCULAR | Status: AC
  Administered 2022-03-18: 12.5 mg via INTRAMUSCULAR
  Filled 2022-03-18: qty 1

## 2022-03-18 MED ORDER — METHOCARBAMOL 500 MG PO TABS
500.0000 mg | ORAL_TABLET | Freq: Once | ORAL | Status: AC
  Administered 2022-03-18: 500 mg via ORAL
  Filled 2022-03-18: qty 1

## 2022-03-18 MED ORDER — METHOCARBAMOL 500 MG PO TABS: 500.0000 mg | ORAL_TABLET | Freq: Three times a day (TID) | ORAL | 0 refills | Status: DC

## 2022-03-18 MED ORDER — HYDROMORPHONE HCL 1 MG/ML IJ SOLN
1.0000 mg | Freq: Once | INTRAMUSCULAR | Status: AC
Start: 2022-03-18 — End: 2022-03-18
  Administered 2022-03-18: 1 mg via INTRAMUSCULAR
  Filled 2022-03-18: qty 1

## 2022-03-18 NOTE — ED Provider Notes (Signed)
?[facility] ?Provider Note ? ? ?CSN: [id number] ?Arrival date & time: 03/18/22  1007 ? ?  ? ?History ? ?Chief Complaint  ?Patient presents with  ? Neck Pain  ? ? ?[name] is a 61 y.o. female. ? ? ?Neck Pain ?Associated symptoms: no headaches, no numbness and no weakness   ? ?  ? ?[name] is a 61 y.o. female with past medical history that includes surgery to her neck in 2018 with hardware placement who presents to the Emergency Department complaining of right-sided neck pain x2 months.  Pain is gradually worsening.  She describes sharp pain through the right side of her neck down into her shoulder area.  Pain is worse with movement of her head and right arm.  No known injury.  She describes a "popping" of her neck with movement.  She is concerned there is a disruption of the hardware.  She denies any recent traumas or falls.  No numbness or weakness of the upper extremities, she also denies headaches dizziness chest pain or visual changes.  She has tried application of a TENS unit without relief. ? ? ?Home Medications ?Prior to Admission medications   ?Medication Sig Start Date End Date Taking? Authorizing Provider  ?albuterol (PROVENTIL HFA;VENTOLIN HFA) 108 (90 Base) MCG/ACT inhaler Inhale 1-2 puffs into the lungs every 6 (six) hours as needed for wheezing. 9/37/34   Delora Fuel, MD  ?amphetamine-dextroamphetamine (ADDERALL) 20 MG tablet Take 5 mg by mouth daily. For attention 10/31/11   [provider]  ?cetirizine (ZYRTEC) 10 MG tablet Take 10 mg by mouth every morning.     [provider]  ?Cholecalciferol (VITAMIN D3) 2000 units TABS Take 1 tablet by mouth daily.    [provider]  ?doxycycline (VIBRA-TABS) 100 MG tablet Take 100 mg by mouth daily at 6 (six) AM. 02/11/22   [provider]  ?estradiol (ESTRACE) 2 MG tablet Take 2 mg by mouth daily. 09/08/16   [provider]  ?fluticasone (FLONASE) 50 MCG/ACT nasal spray Place 2 sprays  into both nostrils daily. 08/07/16   Valentina Shaggy, MD  ?furosemide (LASIX) 40 MG tablet Take 40 mg by mouth daily.     [provider]  ?ibuprofen (ADVIL) 100 MG tablet Take 100 mg by mouth as needed for fever.    [provider]  ?omeprazole (PRILOSEC) 40 MG capsule Take 40 mg by mouth daily.    [provider]  ?Oxycodone HCl 10 MG TABS Take 10 mg by mouth every 6 (six) hours.    [provider]  ?potassium chloride SA (K-DUR,KLOR-CON) 20 MEQ tablet Take 20 mEq by mouth daily.  08/19/13   [provider]  ?pramipexole (MIRAPEX) 0.5 MG tablet Take 0.5 mg by mouth at bedtime.     [provider]  ?UNABLE TO FIND Take 1 capsule by mouth daily. Med Name: Apolline weight loss medicine    [provider]  ?   ? ?Allergies    ?Ativan [lorazepam], Clarithromycin, Ibuprofen, Pseudoephedrine, Reglan [metoclopramide], Stadol [butorphanol tartrate], Sulfa antibiotics, Toradol [ketorolac tromethamine], Factive [gemifloxacin mesylate], Other, Prochlorperazine edisylate, Promethazine hcl, Tofranil-pm, and Trimethobenzamide hcl   ? ?Review of Systems   ?Review of Systems  ?Musculoskeletal:  Positive for neck pain. Negative for arthralgias and back pain.  ?Skin:  Negative for rash.  ?Neurological:  Negative for dizziness, syncope, weakness, numbness and headaches.  ?All other systems reviewed and are negative. ? ?Physical Exam ?Updated Vital Signs ?BP Marland Kitchen)  149/91 (BP Location: Right Arm)   Pulse 95   Temp 98 ?F (36.7 ?C) (Oral)   Resp 18   Ht '5\' 1"'$  (1.549 m)   Wt 69.9 kg   LMP 12/29/1993   SpO2 100%   BMI 29.10 kg/m?  ?Physical Exam ?Vitals and nursing note reviewed.  ?Constitutional:   ?   Appearance: She is not toxic-appearing.  ?   Comments: Patient is tearful and appears anxious  ?HENT:  ?   Head: Atraumatic.  ?   Mouth/Throat:  ?   Mouth: Mucous membranes are moist.  ?Eyes:  ?   Conjunctiva/sclera: Conjunctivae normal.  ?   Pupils: Pupils are equal,  round, and reactive to light.  ?Neck:  ?   Trachea: Phonation normal.  ?   Comments: Diffuse tenderness to palpation of the right cervical paraspinal, trapezius and upper rhomboid muscles.  No bony step-offs or significant midline tenderness ?Cardiovascular:  ?   Rate and Rhythm: Normal rate and regular rhythm.  ?   Pulses: Normal pulses.  ?Pulmonary:  ?   Effort: Pulmonary effort is normal. No respiratory distress.  ?   Breath sounds: Normal breath sounds.  ?Chest:  ?   Chest wall: No tenderness.  ?Abdominal:  ?   Palpations: Abdomen is soft.  ?   Tenderness: There is no abdominal tenderness.  ?Musculoskeletal:     ?   General: Normal range of motion.  ?   Cervical back: Tenderness present. No rigidity. Muscular tenderness present.  ?Lymphadenopathy:  ?   Cervical: No cervical adenopathy.  ?Skin: ?   General: Skin is warm.  ?   Capillary Refill: Capillary refill takes less than 2 seconds.  ?   Findings: No erythema or rash.  ?Neurological:  ?   General: No focal deficit present.  ?   Mental Status: She is alert.  ?   Sensory: No sensory deficit.  ?   Motor: No weakness.  ? ? ?ED Results / Procedures / Treatments   ?Labs ?(all labs ordered are listed, but only abnormal results are displayed) ?Labs Reviewed - No data to display ? ?EKG ?None ? ?Radiology ?DG Cervical Spine Complete ? ?Result Date: 03/18/2022 ?CLINICAL DATA:  Neck pain EXAM: CERVICAL SPINE - COMPLETE 4+ VIEW COMPARISON:  12/11/2016 FINDINGS: No recent fracture is seen. There is reversal of lordosis. There is previous anterior surgical fusion at C5-C6 and C6-C7 levels. There is minimal anterolisthesis at C3-C4 and C4-C5 levels. There is encroachment of neural foramina at C3-C4 and C4-C5 levels, more so on the left side. Prevertebral soft tissues are unremarkable. IMPRESSION: No recent fracture is seen. There is minimal anterolisthesis at C3-C4 and C4-C5 levels. There is encroachment of neural foramina at C3-C4 and C4-C5 levels more so on the left side.  Previous anterior surgical fusion at C5-C6 and C6-C7 levels. Electronically Signed   By: Elmer Picker M.D.   On: 03/18/2022 12:04   ? ?Procedures ?Procedures  ? ? ?Medications Ordered in ED ?Medications - No data to display ? ?ED Course/ Medical Decision Making/ A&P ?  ?                        ?Medical Decision Making ?Patient here for evaluation of right-sided neck pain x2 months, gradually worsening.  No reported injury or trauma.  Reports history of cervical surgery in 2018.  She hears popping noises with movement of her neck and is concerned about displaced hardware. ? ?On exam,  patient is anxious and tearful, appears uncomfortable but nontoxic.  Vitals she is mildly hypertensive but otherwise vitals are reassuring.  There are no focal neurodeficits on her exam.  She does have diffuse tenderness along the right paraspinal muscles and upper rhomboid muscles.  Likely related to spasms.  We will address her pain here and obtain imaging.  Anticipate discharge home. ? ?Amount and/or Complexity of Data Reviewed ?Radiology: ordered. ?   Details: XR of the C spine neg for fx. some encroachment of neural foramina C3-4 and C4-5 levels mostly left side.  pt's current sx's are right side. ? ?Risk ?Prescription drug management. ? ? ?On recheck, pt's reports improvement of her pain.  I feel her pain is related to muscular spasms. There is some encroachment of neural foramina and minimal anterolisthesis superior to her fusion which could be contributing to her sx's, but encroachment worse on left and pt's sx's are right sided.  No radicular sx's ? ?Pt appropriate for d/c home.  Will give steroid and she will f/u closely with neurosurgery.  Return precautions discussed.  ? ? ? ? ? ? ? ? ?Final Clinical Impression(s) / ED Diagnoses ?Final diagnoses:  ?Neck pain  ?Muscle spasms of neck  ? ? ?Rx / DC Orders ?ED Discharge Orders   ? ? None  ? ?  ? ? ?  ?Kem Parkinson, PA-C ?03/19/22 2329 ? ?  ?Sherwood Gambler, MD ?03/22/22  1452 ? ?

## 2022-03-18 NOTE — Discharge Instructions (Signed)
Continue taking your pain medication as directed.  You may start the prednisone today.  Alternate with ice and heat to your neck.  Call the neurosurgeons office to arrange a follow-up appointment. ?

## 2022-03-18 NOTE — ED Triage Notes (Signed)
Pt reports had surgery on her neck in 2018 and for past 2 months has been having pain in r side of  neck.  Says has progressively gotten worse.  ?

## 2022-03-31 ENCOUNTER — Other Ambulatory Visit (HOSPITAL_COMMUNITY): Payer: Self-pay | Admitting: Neurosurgery

## 2022-03-31 DIAGNOSIS — G992 Myelopathy in diseases classified elsewhere: Secondary | ICD-10-CM

## 2022-03-31 NOTE — Patient Instructions (Signed)
? ? ? ? ? ? ? ? ? ? Analissa L Cox ? 03/31/2022  ?  ? '@PREFPERIOPPHARMACY'$ @ ? ? Your procedure is scheduled on  04/03/2022. ? ? Report to [facility] at  [place] A.M. ? ? Call this number if you have problems the morning of surgery: ? [phone] ? ? Remember: ? Follow the diet and prep instructions given to you by the office. ? ?Use your inhaler before you come and bring your rescue inhaler with you. ?  ? ? Take these medicines the morning of surgery with A SIP OF WATER  ? ?zyrtec, robaxin (if needed), prilosec, oxycodone(if needed). ?  ? ? Do not wear jewelry, make-up or nail polish. ? Do not wear lotions, powders, or perfumes, or deodorant. ? Do not shave 48 hours prior to surgery.  Men may shave face and neck. ? Do not bring valuables to the hospital. ? [facility] is not responsible for any belongings or valuables. ? ?Contacts, dentures or bridgework may not be worn into surgery.  Leave your suitcase in the car.  After surgery it may be brought to your room. ? ?For patients admitted to the hospital, discharge time will be determined by your treatment team. ? ?Patients discharged the day of surgery will not be allowed to drive home and must have someone with them for 24 hours.  ? ? ?Special instructions:   DO NOT smoke tobacco or vape for 24 hours before your procedure. ? ?Please read over the following fact sheets that you were given. ?Anesthesia Post-op Instructions and Care and Recovery After Surgery ?  ? ? ? Upper Endoscopy, Adult, Care After ?This sheet gives you information about how to care for yourself after your procedure. Your health care provider may also give you more specific instructions. If you have problems or questions, contact your health care provider. ?What can I expect after the procedure? ?After the procedure, it is common to have: ?A sore throat. ?Mild stomach pain or discomfort. ?Bloating. ?Nausea. ?Follow these instructions at home: ? ?Follow instructions from your health care provider about what  to eat or drink after your procedure. ?Return to your normal activities as told by your health care provider. Ask your health care provider what activities are safe for you. ?Take over-the-counter and prescription medicines only as told by your health care provider. ?If you were given a sedative during the procedure, it can affect you for several hours. Do not drive or operate machinery until your health care provider says that it is safe. ?Keep all follow-up visits as told by your health care provider. This is important. ?Contact a health care provider if you have: ?A sore throat that lasts longer than one day. ?Trouble swallowing. ?Get help right away if: ?You vomit blood or your vomit looks like coffee grounds. ?You have: ?A fever. ?Bloody, black, or tarry stools. ?A severe sore throat or you cannot swallow. ?Difficulty breathing. ?Severe pain in your chest or abdomen. ?Summary ?After the procedure, it is common to have a sore throat, mild stomach discomfort, bloating, and nausea. ?If you were given a sedative during the procedure, it can affect you for several hours. Do not drive or operate machinery until your health care provider says that it is safe. ?Follow instructions from your health care provider about what to eat or drink after your procedure. ?Return to your normal activities as told by your health care provider. ?This information is not intended to replace advice given to you by your  health care provider. Make sure you discuss any questions you have with your health care provider. ?Document Revised: 10/21/2019 Document Reviewed: 05/17/2018 ?Elsevier Patient Education ? [place]. ?Esophageal Dilatation ?Esophageal dilatation, also called esophageal dilation, is a procedure to widen or open a blocked or narrowed part of the esophagus. The esophagus is the part of the body that moves food and liquid from the mouth to the stomach. You may need this procedure if: ?You have a buildup of scar tissue  in your esophagus that makes it difficult, painful, or impossible to swallow. This can be caused by gastroesophageal reflux disease (GERD). ?You have cancer of the esophagus. ?There is a problem with how food moves through your esophagus. ?In some cases, you may need this procedure repeated at a later time to dilate the esophagus gradually. ?Tell a health care provider about: ?Any allergies you have. ?All medicines you are taking, including vitamins, herbs, eye drops, creams, and over-the-counter medicines. ?Any problems you or family members have had with anesthetic medicines. ?Any blood disorders you have. ?Any surgeries you have had. ?Any medical conditions you have. ?Any antibiotic medicines you are required to take before dental procedures. ?Whether you are pregnant or may be pregnant. ?What are the risks? ?Generally, this is a safe procedure. However, problems may occur, including: ?Bleeding due to a tear in the lining of the esophagus. ?A hole, or perforation, in the esophagus. ?What happens before the procedure? ?Ask your health care provider about: ?Changing or stopping your regular medicines. This is especially important if you are taking diabetes medicines or blood thinners. ?Taking medicines such as aspirin and ibuprofen. These medicines can thin your blood. Do not take these medicines unless your health care provider tells you to take them. ?Taking over-the-counter medicines, vitamins, herbs, and supplements. ?Follow instructions from your health care provider about eating or drinking restrictions. ?Plan to have a responsible adult take you home from the hospital or clinic. ?Plan to have a responsible adult care for you for the time you are told after you leave the hospital or clinic. This is important. ?What happens during the procedure? ?You may be given a medicine to help you relax (sedative). ?A numbing medicine may be sprayed into the back of your throat, or you may gargle the medicine. ?Your  health care provider may perform the dilatation using various surgical instruments, such as: ?Simple dilators. This instrument is carefully placed in the esophagus to stretch it. ?Guided wire bougies. This involves using an endoscope to insert a wire into the esophagus. A dilator is passed over this wire to enlarge the esophagus. Then the wire is removed. ?Balloon dilators. An endoscope with a small balloon is inserted into the esophagus. The balloon is inflated to stretch the esophagus and open it up. ?The procedure may vary among health care providers and hospitals. ?What can I expect after the procedure? ?Your blood pressure, heart rate, breathing rate, and blood oxygen level will be monitored until you leave the hospital or clinic. ?Your throat may feel slightly sore and numb. This will get better over time. ?You will not be allowed to eat or drink until your throat is no longer numb. ?When you are able to drink, urinate, and sit on the edge of the bed without nausea or dizziness, you may be able to return home. ?Follow these instructions at home: ?Take over-the-counter and prescription medicines only as told by your health care provider. ?If you were given a sedative during the procedure, it can  affect you for several hours. Do not drive or operate machinery until your health care provider says that it is safe. ?Plan to have a responsible adult care for you for the time you are told. This is important. ?Follow instructions from your health care provider about any eating or drinking restrictions. ?Do not use any products that contain nicotine or tobacco, such as cigarettes, e-cigarettes, and chewing tobacco. If you need help quitting, ask your health care provider. ?Keep all follow-up visits. This is important. ?Contact a health care provider if: ?You have a fever. ?You have pain that is not relieved by medicine. ?Get help right away if: ?You have chest pain. ?You have trouble breathing. ?You have trouble  swallowing. ?You vomit blood. ?You have black, tarry, or bloody stools. ?These symptoms may represent a serious problem that is an emergency. Do not wait to see if the symptoms will go away. Get medical help r

## 2022-04-01 ENCOUNTER — Encounter (HOSPITAL_COMMUNITY)
Admission: RE | Admit: 2022-04-01 | Discharge: 2022-04-01 | Disposition: A | Discharge status: Home / Self Care | Payer: Medicare Other | Source: Ambulatory Visit | Attending: Internal Medicine | Admitting: Internal Medicine

## 2022-04-01 ENCOUNTER — Encounter (HOSPITAL_COMMUNITY): Payer: Self-pay

## 2022-04-01 DIAGNOSIS — Z79899 Other long term (current) drug therapy: Secondary | ICD-10-CM

## 2022-04-01 DIAGNOSIS — Z01818 Encounter for other preprocedural examination: Secondary | ICD-10-CM | POA: Diagnosis present

## 2022-04-01 HISTORY — DX: Nonrheumatic mitral (valve) prolapse: I34.1

## 2022-04-01 LAB — BASIC METABOLIC PANEL
Anion gap: 7 (ref 5–15)
BUN: 13 mg/dL (ref 6–20)
CO2: 29 mmol/L (ref 22–32)
Calcium: 9 mg/dL (ref 8.9–10.3)
Chloride: 103 mmol/L (ref 98–111)
Creatinine, Ser: 0.83 mg/dL (ref 0.44–1.00)
GFR, Estimated: >60 mL/min (ref >60)
Glucose, Bld: 125 mg/dL — ABNORMAL HIGH (ref 70–99)
Potassium: 4 mmol/L (ref 3.5–5.1)
Sodium: 139 mmol/L (ref 135–145)

## 2022-04-03 ENCOUNTER — Ambulatory Visit (HOSPITAL_BASED_OUTPATIENT_CLINIC_OR_DEPARTMENT_OTHER): Payer: Medicare Other | Admitting: Anesthesiology

## 2022-04-03 ENCOUNTER — Encounter (HOSPITAL_COMMUNITY)
Admission: RE | Disposition: A | Discharge status: Home / Self Care | Payer: Self-pay | Source: Home / Self Care | Attending: Internal Medicine

## 2022-04-03 ENCOUNTER — Ambulatory Visit (HOSPITAL_COMMUNITY): Payer: Medicare Other | Admitting: Anesthesiology

## 2022-04-03 ENCOUNTER — Encounter (HOSPITAL_COMMUNITY): Payer: Self-pay | Admitting: Internal Medicine

## 2022-04-03 ENCOUNTER — Ambulatory Visit (HOSPITAL_COMMUNITY)
Admission: RE | Admit: 2022-04-03 | Discharge: 2022-04-03 | Disposition: A | Discharge status: Home / Self Care | Payer: Medicare Other | Attending: Internal Medicine | Admitting: Internal Medicine

## 2022-04-03 DIAGNOSIS — M797 Fibromyalgia: Secondary | ICD-10-CM | POA: Insufficient documentation

## 2022-04-03 DIAGNOSIS — R1310 Dysphagia, unspecified: Secondary | ICD-10-CM | POA: Diagnosis not present

## 2022-04-03 DIAGNOSIS — N289 Disorder of kidney and ureter, unspecified: Secondary | ICD-10-CM | POA: Insufficient documentation

## 2022-04-03 DIAGNOSIS — G2581 Restless legs syndrome: Secondary | ICD-10-CM | POA: Diagnosis not present

## 2022-04-03 DIAGNOSIS — D124 Benign neoplasm of descending colon: Secondary | ICD-10-CM | POA: Insufficient documentation

## 2022-04-03 DIAGNOSIS — K635 Polyp of colon: Secondary | ICD-10-CM

## 2022-04-03 DIAGNOSIS — Z1211 Encounter for screening for malignant neoplasm of colon: Secondary | ICD-10-CM | POA: Diagnosis not present

## 2022-04-03 DIAGNOSIS — K621 Rectal polyp: Secondary | ICD-10-CM | POA: Diagnosis not present

## 2022-04-03 DIAGNOSIS — Z86010 Personal history of colonic polyps: Secondary | ICD-10-CM

## 2022-04-03 DIAGNOSIS — I341 Nonrheumatic mitral (valve) prolapse: Secondary | ICD-10-CM | POA: Insufficient documentation

## 2022-04-03 DIAGNOSIS — Z79891 Long term (current) use of opiate analgesic: Secondary | ICD-10-CM | POA: Diagnosis not present

## 2022-04-03 DIAGNOSIS — K219 Gastro-esophageal reflux disease without esophagitis: Secondary | ICD-10-CM | POA: Diagnosis not present

## 2022-04-03 DIAGNOSIS — K222 Esophageal obstruction: Secondary | ICD-10-CM | POA: Insufficient documentation

## 2022-04-03 DIAGNOSIS — J449 Chronic obstructive pulmonary disease, unspecified: Secondary | ICD-10-CM | POA: Diagnosis not present

## 2022-04-03 DIAGNOSIS — Z87891 Personal history of nicotine dependence: Secondary | ICD-10-CM | POA: Insufficient documentation

## 2022-04-03 DIAGNOSIS — K317 Polyp of stomach and duodenum: Secondary | ICD-10-CM | POA: Insufficient documentation

## 2022-04-03 DIAGNOSIS — Z9049 Acquired absence of other specified parts of digestive tract: Secondary | ICD-10-CM | POA: Insufficient documentation

## 2022-04-03 DIAGNOSIS — Z8589 Personal history of malignant neoplasm of other organs and systems: Secondary | ICD-10-CM | POA: Insufficient documentation

## 2022-04-03 HISTORY — PX: MALONEY DILATION: SHX5535

## 2022-04-03 HISTORY — PX: ESOPHAGOGASTRODUODENOSCOPY (EGD) WITH PROPOFOL: SHX5813

## 2022-04-03 HISTORY — PX: COLONOSCOPY WITH PROPOFOL: SHX5780

## 2022-04-03 HISTORY — PX: BIOPSY: SHX5522

## 2022-04-03 HISTORY — PX: POLYPECTOMY: SHX5525

## 2022-04-03 SURGERY — COLONOSCOPY WITH PROPOFOL
Anesthesia: General

## 2022-04-03 MED ORDER — PROPOFOL 500 MG/50ML IV EMUL
INTRAVENOUS | Status: AC
  Filled 2022-04-03: qty 50

## 2022-04-03 MED ORDER — PROPOFOL 500 MG/50ML IV EMUL
INTRAVENOUS | Status: DC | PRN
  Administered 2022-04-03: 150 ug/kg/min via INTRAVENOUS

## 2022-04-03 MED ORDER — LIDOCAINE HCL (PF) 2 % IJ SOLN
INTRAMUSCULAR | Status: AC
Start: 2022-04-03 — End: ?
  Filled 2022-04-03: qty 5

## 2022-04-03 MED ORDER — LACTATED RINGERS IV SOLN
INTRAVENOUS | Status: DC
Start: 2022-04-03 — End: 2022-04-03
  Administered 2022-04-03: 1000 mL via INTRAVENOUS

## 2022-04-03 MED ORDER — KETAMINE HCL 50 MG/5ML IJ SOSY
PREFILLED_SYRINGE | INTRAMUSCULAR | Status: AC
  Filled 2022-04-03: qty 5

## 2022-04-03 MED ORDER — LIDOCAINE HCL 1 % IJ SOLN
INTRAMUSCULAR | Status: DC | PRN
  Administered 2022-04-03: 50 mg via INTRADERMAL

## 2022-04-03 MED ORDER — KETAMINE HCL 10 MG/ML IJ SOLN
INTRAMUSCULAR | Status: DC | PRN
  Administered 2022-04-03: 20 mg via INTRAVENOUS

## 2022-04-03 MED ORDER — PROPOFOL 10 MG/ML IV BOLUS
INTRAVENOUS | Status: DC | PRN
  Administered 2022-04-03: 40 mg via INTRAVENOUS
  Administered 2022-04-03: 80 mg via INTRAVENOUS

## 2022-04-03 NOTE — Op Note (Signed)
[facility] ?Patient Name: [name] ?Procedure Date: 04/03/2022 8:46 AM ?MRN: [id number] ?Date of Birth: [birth year] ?Attending MD: Norvel Richards , MD ?CSN: [id number] ?Age: 61 ?Admit Type: Outpatient ?Procedure:                Colonoscopy ?Indications:              High risk colon cancer surveillance: Personal  ?                          history of colonic polyps ?Providers:                Norvel Richards, MD, Janeece Riggers, RN, Ulice Dash  ?                          Blima Singer, Technician ?Referring MD:              ?Medicines:                Propofol per Anesthesia ?Complications:            No immediate complications. ?Estimated Blood Loss:     Estimated blood loss: none. ?Procedure:                Pre-Anesthesia Assessment: ?                          - Prior to the procedure, a History and Physical  ?                          was performed, and patient medications and  ?                          allergies were reviewed. The patient's tolerance of  ?                          previous anesthesia was also reviewed. The risks  ?                          and benefits of the procedure and the sedation  ?                          options and risks were discussed with the patient.  ?                          All questions were answered, and informed consent  ?                          was obtained. Prior Anticoagulants: The patient has  ?                          taken no previous anticoagulant or antiplatelet  ?                          agents. ASA Grade Assessment: III - A patient with  ?  severe systemic disease. After reviewing the risks  ?                          and benefits, the patient was deemed in  ?                          satisfactory condition to undergo the procedure. ?                          After obtaining informed consent, the colonoscope  ?                          was passed under direct vision. Throughout the  ?                          procedure, the  patient's blood pressure, pulse, and  ?                          oxygen saturations were monitored continuously. The  ?                          [id number] scope was introduced through  ?                          the anus and advanced to anastomosis with small  ?                          bowel. ?Scope In: 9:43:25 AM ?Scope Out: 9:55:54 AM ?Scope Withdrawal Time: 0 hours 8 minutes 8 seconds  ?Total Procedure Duration: 0 hours 12 minutes 29 seconds  ?Findings: ?     The perianal and digital rectal examinations were normal. ?     Two sessile polyps were found in the descending colon. The polyps were 2  ?     to 4 mm in size. These polyps were removed with a cold snare. Resection  ?     and retrieval were complete. Estimated blood loss was minimal. ?     A 3 mm polyp was found in the rectum. The polyp was sessile. The polyp  ?     was removed with a cold snare. Resection and retrieval were complete.  ?     Estimated blood loss was minimal. ?     The exam was otherwise without abnormality. ?Impression:               - Two 2 to 4 mm polyps in the descending colon,  ?                          removed with a cold snare. Resected and retrieved. ?                          - One 3 mm polyp in the rectum, removed with a cold  ?                          snare. Resected and retrieved. ?                          -  The examination was otherwise normal. Status post  ?                          right hemicolectomy. ?Moderate Sedation: ?     Moderate (conscious) sedation was personally administered by an  ?     anesthesia professional. The following parameters were monitored: oxygen  ?     saturation, heart rate, blood pressure, respiratory rate, EKG, adequacy  ?     of pulmonary ventilation, and response to care. ?Recommendation:           - Patient has a contact number available for  ?                          emergencies. The signs and symptoms of potential  ?                          delayed complications were discussed  with the  ?                          patient. Return to normal activities tomorrow.  ?                          Written discharge instructions were provided to the  ?                          patient. ?                          - Advance diet as tolerated. ?                          - Continue present medications. ?                          - Repeat colonoscopy date to be determined after  ?                          pending pathology results are reviewed for  ?                          surveillance. ?                          - Return to GI office in 6 months. ?Procedure Code(s):        --- Professional --- ?                          [phone], Colonoscopy, flexible; with removal of  ?                          tumor(s), polyp(s), or other lesion(s) by snare  ?                          technique ?Diagnosis Code(s):        --- Professional --- ?  Z86.010, Personal history of colonic polyps ?                          K63.5, Polyp of colon ?                          K62.1, Rectal polyp ?CPT copyright 2019 American Medical Association. All rights reserved. ?The codes documented in this report are preliminary and upon coder review may  ?be revised to meet current compliance requirements. ?Cristopher Estimable. Charleigh Correnti, MD ?Norvel Richards, MD ?04/03/2022 10:18:09 AM ?This report has been signed electronically. ?Number of Addenda: 0 ?

## 2022-04-03 NOTE — Anesthesia Postprocedure Evaluation (Signed)
Anesthesia Post Note ? ?Patient: [name] ? ?Procedure(s) Performed: COLONOSCOPY WITH PROPOFOL ?ESOPHAGOGASTRODUODENOSCOPY (EGD) WITH PROPOFOL ?MALONEY DILATION ?BIOPSY ?POLYPECTOMY ? ?Patient location during evaluation: Short Stay ?Anesthesia Type: General ?Level of consciousness: awake and alert ?Pain management: pain level controlled ?Vital Signs Assessment: post-procedure vital signs reviewed and stable ?Respiratory status: spontaneous breathing ?Cardiovascular status: blood pressure returned to baseline ?Postop Assessment: no apparent nausea or vomiting ?Anesthetic complications: no ? ? ?No notable events documented. ? ? ?Last Vitals:  ?Vitals:  ? 04/03/22 0902 04/03/22 1002  ?BP: 130/67 (!) 143/86  ?Pulse:  84  ?Resp: 13 16  ?Temp: 36.8 ?C 36.6 ?C  ?SpO2: 97% 99%  ?  ?Last Pain:  ?Vitals:  ? 04/03/22 1002  ?TempSrc: Oral  ?PainSc: 0-No pain  ? ? ?  ?  ?  ?  ?  ?  ? ?Adelaido Nicklaus ? ? ? ? ?

## 2022-04-03 NOTE — H&P (Signed)
?$'@LOGO'b$ @ ? ? ?Primary Care Physician:  Emelia Loron, NP ?Primary Gastroenterologist:  Dr.  Marland Kitchen ?Pre-Procedure History & Physical: ?HPI:  [name] is a 61 y.o. female here for EGD for recurrent dysphagia in the setting of a known Schatzki's ring dilated previously.  Also here for surveillance colonoscopy history of advanced adenoma removed in the past.  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  Overdue for surveillance colonoscopy. ? ?No bowel symptoms.  Reflux well controlled on omeprazole. ? ?Past Medical History:  ?Diagnosis Date  ? Alopecia, unspecified   ? Anxiety   ? Arthritis   ? Bronchitis   ? Cancer [facility])   ? carcinoid tumor of appendix  ? Carcinoid tumor of appendix   ? 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 Reoperation for her lymph node dissection revealed all nodes to be absolutely negative.   ? Cervicalgia   ? COPD (chronic obstructive pulmonary disease) ([place])   ? Depression with anxiety   ? Dysphagia   ? Fibromyalgia   ? GERD (gastroesophageal reflux disease)   ? IBS (irritable bowel syndrome)   ? Interstitial cystitis   ? Kidney stones   ? Migraine   ? Mitral valve prolapse   ? PONV (postoperative nausea and vomiting)   ? PTSD (post-traumatic stress disorder)   ? Renal failure   ? RLS (restless legs syndrome)   ? Shortness of breath   ? Tobacco use disorder   ? UTI (lower urinary tract infection)   ? ? ?Past Surgical History:  ?Procedure Laterality Date  ? ABDOMINAL HYSTERECTOMY    ? ABDOMINAL SURGERY    ? APPENDECTOMY  01/05/2012  ? with incidental finding of carcinoid  ? CARDIAC CATHETERIZATION    ? no stents needed  ? CARDIAC SURGERY    ? CHOLECYSTECTOMY    ? COLON RESECTION  01/30/2012  ? Procedure: HAND ASSISTED LAPAROSCOPIC COLON RESECTION;  Surgeon: Jamesetta So, MD;  Location: [facility];  Service: General;   Laterality: N/A;  ? COLON SURGERY    ? COLONOSCOPY WITH PROPOFOL N/A 09/29/2013  ? MPN:TIRWER and colonic polyps-removed as described above Status post right hemicolectomy  ? COLONOSCOPY WITH PROPOFOL N/A 03/15/2015  ? Procedure: COLONOSCOPY WITH PROPOFOL;  Surgeon: Daneil Dolin, MD; normal exam.  Repeat in 5 years.  ? CYSTOSTOMY W/ BLADDER DILATION    ? x 52 [place]  ? ESOPHAGEAL DILATION  01/01/2012  ? RMR: Normal esophagus - status post dilation with 69 F Maloney dilation, gastric polyps benign path  ? ESOPHAGOGASTRODUODENOSCOPY (EGD) WITH PROPOFOL N/A 09/29/2013  ? [id number] appearing Schatzki's ring-status post dilation as described above. Hiatal hernia.  ? GIVENS CAPSULE STUDY  01/27/2012  ? normal  ? HAND SURGERY Bilateral   ? HEMICOLECTOMY    ? ileo-colonoscopy  01/01/2012  ? RMR: multiple colonic polyps, largest polyp in proximal sigmoid colon. Tubulovillous adenoma with high grade dysplasia, tubular adenoma  ? LAPAROSCOPIC APPENDECTOMY  01/05/2012  ? Procedure: APPENDECTOMY LAPAROSCOPIC;  Surgeon: Jamesetta So;  Location: [facility];  Service: General;  Laterality: N/A;  ? MALONEY DILATION N/A 09/29/2013  ? Procedure: MALONEY DILATION;  Surgeon: Daneil Dolin, MD;  Location: [facility];  Service: Endoscopy;  Laterality: N/A;  55  ? PARTIAL COLECTOMY  01/30/2012  ? Procedure: PARTIAL COLECTOMY;  Surgeon: Jamesetta So, MD;  Location:  [facility];  Service: General;  Laterality: N/A;  ? POLYPECTOMY N/A 09/29/2013  ? Procedure: POLYPECTOMY;  Surgeon: Daneil Dolin, MD;  Location: [facility];  Service: Endoscopy;  Laterality: N/A;  ? TONSILLECTOMY    ? ? ?Prior to Admission medications   ?Medication Sig Start Date End Date Taking? Authorizing Provider  ?albuterol (PROVENTIL HFA;VENTOLIN HFA) 108 (90 Base) MCG/ACT inhaler Inhale 1-2 puffs into the lungs every 6 (six) hours as needed for wheezing. 1/61/09  Yes Delora Fuel, MD  ?amphetamine-dextroamphetamine (ADDERALL) 10 MG tablet Take 5-10 mg by mouth daily.  For attention 10/31/11  Yes [provider]  ?cetirizine (ZYRTEC) 10 MG tablet Take 10 mg by mouth every morning.    Yes [provider]  ?Cholecalciferol (VITAMIN D3) 2000 units TABS Take 2,000 Units by mouth daily.   Yes [provider]  ?estradiol (ESTRACE) 2 MG tablet Take 2 mg by mouth daily. 09/08/16  Yes [provider]  ?fluticasone (FLONASE) 50 MCG/ACT nasal spray Place 2 sprays into both nostrils daily. 08/07/16  Yes Valentina Shaggy, MD  ?furosemide (LASIX) 40 MG tablet Take 40 mg by mouth daily.    Yes [provider]  ?ibuprofen (ADVIL) 100 MG tablet Take 100 mg by mouth every 8 (eight) hours as needed for pain or fever.   Yes [provider]  ?methocarbamol (ROBAXIN) 500 MG tablet Take 1 tablet (500 mg total) by mouth 3 (three) times daily. ?Patient taking differently: Take 500 mg by mouth every 8 (eight) hours as needed for muscle spasms. 03/18/22  Yes Triplett, Tammy, PA-C  ?omeprazole (PRILOSEC) 40 MG capsule Take 40 mg by mouth daily.   Yes [provider]  ?Oxycodone HCl 10 MG TABS Take 10 mg by mouth every 6 (six) hours.   Yes [provider]  ?potassium chloride SA (K-DUR,KLOR-CON) 20 MEQ tablet Take 20 mEq by mouth daily.  08/19/13  Yes [provider]  ?pramipexole (MIRAPEX) 0.5 MG tablet Take 0.5 mg by mouth at bedtime.    Yes [provider]  ? ? ?Allergies as of 03/06/2022 - Review Complete 03/05/2022  ?Allergen Reaction Noted  ? Ativan [lorazepam] Other (See Comments) 01/29/2012  ? Clarithromycin Itching and Nausea And Vomiting   ? Ibuprofen Other (See Comments) 01/08/2013  ? Pseudoephedrine Hives   ? Reglan [metoclopramide] Other (See Comments) 06/14/2013  ? Stadol [butorphanol tartrate] Nausea And Vomiting 07/19/2012  ? Sulfa antibiotics Nausea And Vomiting 08/28/2011  ? Toradol [ketorolac tromethamine] Other (See Comments) 07/30/2012  ? Factive [gemifloxacin mesylate] Rash 08/28/2011  ? Other Rash  07/19/2012  ? Prochlorperazine edisylate Anxiety   ? Promethazine hcl Anxiety   ? Tofranil-pm Rash 01/02/2012  ? Trimethobenzamide hcl Anxiety   ? ? ?Family History  ?Problem Relation Age of Onset  ? Coronary artery disease Mother   ? Heart attack Mother   ? Pulmonary embolism Mother   ? Alcohol abuse Father   ? COPD Father   ? Lung disease Father   ? Asthma Father   ? Colon cancer Sister   ?     50  ? Anesthesia problems Neg Hx   ? Hypotension Neg Hx   ? Malignant hyperthermia Neg Hx   ? Pseudochol deficiency Neg Hx   ? Other Neg Hx   ? Allergic rhinitis Neg Hx   ? Angioedema Neg Hx   ? Atopy Neg Hx   ? Eczema Neg Hx   ? Immunodeficiency Neg Hx   ? Urticaria Neg Hx   ? ? ?  Social History  ? ?Socioeconomic History  ? Marital status: Widowed  ?  Spouse name: Not on file  ? Number of children: 1  ? Years of education: Not on file  ? Highest education level: Not on file  ?Occupational History  ? Occupation: disabled  ?  Employer: UNEMPLOYED  ?Tobacco Use  ? Smoking status: Former  ?  Packs/day: 0.10  ?  Years: 7.00  ?  Pack years: 0.70  ?  Types: E-cigarettes, Cigarettes  ? Smokeless tobacco: Current  ? Tobacco comments:  ?  Vapes occasionally  ?Vaping Use  ? Vaping Use: Some days  ?Substance and Sexual Activity  ? Alcohol use: No  ? Drug use: No  ? Sexual activity: Yes  ?  Birth control/protection: Surgical  ?Other Topics Concern  ? Not on file  ?Social History Narrative  ? Not on file  ? ?Social Determinants of Health  ? ?Financial Resource Strain: Not on file  ?Food Insecurity: Not on file  ?Transportation Needs: Not on file  ?Physical Activity: Not on file  ?Stress: Not on file  ?Social Connections: Not on file  ?Intimate Partner Violence: Not on file  ? ? ?Review of Systems: ?See HPI, otherwise negative ROS ? ?Physical Exam: ?BP 130/67   Temp 98.3 ?F (36.8 ?C) (Oral)   Resp 13   LMP 12/29/1993   SpO2 97%  ?General:   Alert,  Well-developed, well-nourished, pleasant and cooperative in NAD ?Mouth:  No deformity  or lesions. ?Neck:  Supple; no masses or thyromegaly. No significant cervical adenopathy. ?Lungs:  Clear throughout to auscultation.   No wheezes, crackles, or rhonchi. No acute distress. ?Heart:  Regular rate

## 2022-04-03 NOTE — Transfer of Care (Signed)
Immediate Anesthesia Transfer of Care Note ? ?Patient: [name] ? ?Procedure(s) Performed: COLONOSCOPY WITH PROPOFOL ?ESOPHAGOGASTRODUODENOSCOPY (EGD) WITH PROPOFOL ?MALONEY DILATION ?BIOPSY ?POLYPECTOMY ? ?Patient Location: Short Stay ? ?Anesthesia Type:General ? ?Level of Consciousness: awake ? ?Airway & Oxygen Therapy: Patient Spontanous Breathing ? ?Post-op Assessment: Report given to RN ? ?Post vital signs: Reviewed and stable ? ?Last Vitals:  ?Vitals Value Taken Time  ?BP    ?Temp    ?Pulse    ?Resp    ?SpO2    ? ? ?Last Pain:  ?Vitals:  ? 04/03/22 0902  ?TempSrc: Oral  ?PainSc: 0-No pain  ?   ? ?Patients Stated Pain Goal: 8 (04/03/22 0902) ? ?Complications: No notable events documented. ?

## 2022-04-03 NOTE — Op Note (Signed)
[facility] ?Patient Name: [name] ?Procedure Date: 04/03/2022 8:46 AM ?MRN: [id number] ?Date of Birth: [birth year] ?Attending MD: Norvel Richards , MD ?CSN: [id number] ?Age: 61 ?Admit Type: Outpatient ?Procedure:                Upper GI endoscopy ?Indications:              Dysphagia ?Providers:                Norvel Richards, MD, Janeece Riggers, RN, Ulice Dash  ?                          Blima Singer, Technician ?Referring MD:              ?Medicines:                Propofol per Anesthesia ?Complications:            No immediate complications. ?Estimated Blood Loss:     Estimated blood loss was minimal. ?Procedure:                Pre-Anesthesia Assessment: ?                          - Prior to the procedure, a History and Physical  ?                          was performed, and patient medications and  ?                          allergies were reviewed. The patient's tolerance of  ?                          previous anesthesia was also reviewed. The risks  ?                          and benefits of the procedure and the sedation  ?                          options and risks were discussed with the patient.  ?                          All questions were answered, and informed consent  ?                          was obtained. Prior Anticoagulants: The patient has  ?                          taken no previous anticoagulant or antiplatelet  ?                          agents. ASA Grade Assessment: III - A patient with  ?                          severe systemic disease. After reviewing the risks  ?  and benefits, the patient was deemed in  ?                          satisfactory condition to undergo the procedure. ?                          After obtaining informed consent, the endoscope was  ?                          passed under direct vision. Throughout the  ?                          procedure, the patient's blood pressure, pulse, and  ?                          oxygen saturations  were monitored continuously. The  ?                          GIF-H190 ([id number]) scope was introduced through the  ?                          mouth, and advanced to the second part of duodenum.  ?                          The upper GI endoscopy was accomplished without  ?                          difficulty. The patient tolerated the procedure  ?                          well. ?Scope In: 9:28:55 AM ?Scope Out: 9:35:48 AM ?Total Procedure Duration: 0 hours 6 minutes 53 seconds  ?Findings: ?     A mild Schatzki ring was found at the gastroesophageal junction. No  ?     nodularity. No Barrett's epithelium. No esophagitis. Scope easily  ?     traversed the ring. Multiple 2 to 3 mm pedunculated polyps in the  ?     stomach. They appeared hyperplastic. ?     The duodenal bulb and second portion of the duodenum were normal. The  ?     scope was withdrawn. Dilation was performed with a Maloney dilator with  ?     mild resistance at 57 Fr. The scope was withdrawn. Dilation was  ?     performed with a Maloney dilator with mild resistance at 56 Fr. The  ?     dilation site was examined following endoscope reinsertion and showed  ?     mild mucosal disruption. Estimated blood loss: none. Finally, one of the  ?     gastric polyps was removed with cold biopsy forcep. ?Impression:               - Mild Schatzki ring. Dilated. Gastric  ?                          polyps???status post biopsy/removal ?                          -  Normal duodenal bulb and second portion of the  ?                          duodenum. ?                          - ?Moderate Sedation: ?     Moderate (conscious) sedation was personally administered by an  ?     anesthesia professional. The following parameters were monitored: oxygen  ?     saturation, heart rate, blood pressure, respiratory rate, EKG, adequacy  ?     of pulmonary ventilation, and response to care. ?Recommendation:           - Patient has a contact number available for  ?                           emergencies. The signs and symptoms of potential  ?                          delayed complications were discussed with the  ?                          patient. Return to normal activities tomorrow.  ?                          Written discharge instructions were provided to the  ?                          patient. ?                          - Advance diet as tolerated. ?                          - Continue present medications. Follow-up on  ?                          pathology. See colonoscopy report. ?                          - Return to my office in 6 months. ?Procedure Code(s):        --- Professional --- ?                          [phone], Esophagogastroduodenoscopy, flexible,  ?                          transoral; diagnostic, including collection of  ?                          specimen(s) by brushing or washing, when performed  ?                          (separate procedure) ?                          43450, Dilation of esophagus, by unguided sound or  ?  bougie, single or multiple passes ?Diagnosis Code(s):        --- Professional --- ?                          K22.2, Esophageal obstruction ?                          R13.10, Dysphagia, unspecified ?CPT copyright 2019 [employer]. All rights reserved. ?The codes documented in this report are preliminary and upon coder review may  ?be revised to meet current compliance requirements. ?Cristopher Estimable. Charon Smedberg, MD ?Norvel Richards, MD ?04/03/2022 10:14:34 AM ?This report has been signed electronically. ?Number of Addenda: 0 ?

## 2022-04-03 NOTE — Discharge Instructions (Signed)
?Colonoscopy ?Discharge Instructions ? ?Read the instructions outlined below and refer to this sheet in the next few weeks. These discharge instructions provide you with general information on caring for yourself after you leave the hospital. Your doctor may also give you specific instructions. While your treatment has been planned according to the most current medical practices available, unavoidable complications occasionally occur. If you have any problems or questions after discharge, call Dr. Gala Romney at [phone]. ?ACTIVITY ?You may resume your regular activity, but move at a slower pace for the next 24 hours.  ?Take frequent rest periods for the next 24 hours.  ?Walking will help get rid of the air and reduce the bloated feeling in your belly (abdomen).  ?No driving for 24 hours (because of the medicine (anesthesia) used during the test).   ?Do not sign any important legal documents or operate any machinery for 24 hours (because of the anesthesia used during the test).  ?NUTRITION ?Drink plenty of fluids.  ?You may resume your normal diet as instructed by your doctor.  ?Begin with a light meal and progress to your normal diet. Heavy or fried foods are harder to digest and may make you feel sick to your stomach (nauseated).  ?Avoid alcoholic beverages for 24 hours or as instructed.  ?MEDICATIONS ?You may resume your normal medications unless your doctor tells you otherwise.  ?WHAT YOU CAN EXPECT TODAY ?Some feelings of bloating in the abdomen.  ?Passage of more gas than usual.  ?Spotting of blood in your stool or on the toilet paper.  ?IF YOU HAD POLYPS REMOVED DURING THE COLONOSCOPY: ?No aspirin products for 7 days or as instructed.  ?No alcohol for 7 days or as instructed.  ?Eat a soft diet for the next 24 hours.  ?FINDING OUT THE RESULTS OF YOUR TEST ?Not all test results are available during your visit. If your test results are not back during the visit, make an appointment with your caregiver to find out the  results. Do not assume everything is normal if you have not heard from your caregiver or the medical facility. It is important for you to follow up on all of your test results.  ?SEEK IMMEDIATE MEDICAL ATTENTION IF: ?You have more than a spotting of blood in your stool.  ?Your belly is swollen (abdominal distention).  ?You are nauseated or vomiting.  ?You have a temperature over 101.  ?You have abdominal pain or discomfort that is severe or gets worse throughout the day.   ?EGD ?Discharge instructions ?Please read the instructions outlined below and refer to this sheet in the next few weeks. These discharge instructions provide you with general information on caring for yourself after you leave the hospital. Your doctor may also give you specific instructions. While your treatment has been planned according to the most current medical practices available, unavoidable complications occasionally occur. If you have any problems or questions after discharge, please call your doctor. ?ACTIVITY ?You may resume your regular activity but move at a slower pace for the next 24 hours.  ?Take frequent rest periods for the next 24 hours.  ?Walking will help expel (get rid of) the air and reduce the bloated feeling in your abdomen.  ?No driving for 24 hours (because of the anesthesia (medicine) used during the test).  ?You may shower.  ?Do not sign any important legal documents or operate any machinery for 24 hours (because of the anesthesia used during the test).  ?NUTRITION ?Drink plenty of fluids.  ?You may  resume your normal diet.  ?Begin with a light meal and progress to your normal diet.  ?Avoid alcoholic beverages for 24 hours or as instructed by your caregiver.  ?MEDICATIONS ?You may resume your normal medications unless your caregiver tells you otherwise.  ?WHAT YOU CAN EXPECT TODAY ?You may experience abdominal discomfort such as a feeling of fullness or ?gas? pains.  ?FOLLOW-UP ?Your doctor will discuss the results of  your test with you.  ?SEEK IMMEDIATE MEDICAL ATTENTION IF ANY OF THE FOLLOWING OCCUR: ?Excessive nausea (feeling sick to your stomach) and/or vomiting.  ?Severe abdominal pain and distention (swelling).  ?Trouble swallowing.  ?Temperature over 101? F (37.8? C).  ?Rectal bleeding or vomiting of blood.   ? ? ?Polyps removed from your stomach and colon ? ?Your esophagus was stretched today ? ?At patient request, I called Jashay Roddy at [phone] results ? ?Office visit with [place] in 6 months ? ?Further recommendations to follow pending review of pathology report ?

## 2022-04-03 NOTE — Anesthesia Preprocedure Evaluation (Addendum)
Anesthesia Evaluation  ?Patient identified by MRN, date of birth, ID band ?Patient awake ? ? ? ?Reviewed: ?Allergy & Precautions, NPO status , Patient's Chart, lab work & pertinent test results ? ?History of Anesthesia Complications ?(+) PONV and history of anesthetic complications ? ?Airway ?Mallampati: II ? ?TM Distance: >3 FB ?Neck ROM: Full ? ? ? Dental ? ?(+) Dental Advisory Given, Implants ?  ?Pulmonary ?shortness of breath and with exertion, asthma , COPD, former smoker,  ?  ?Pulmonary exam normal ?breath sounds clear to auscultation ? ? ? ? ? ? Cardiovascular ?Exercise Tolerance: Good ?Normal cardiovascular exam+ Valvular Problems/Murmurs MVP  ?Rhythm:Regular Rate:Normal ? ? ?  ?Neuro/Psych ? Headaches, PSYCHIATRIC DISORDERS Anxiety Depression  Neuromuscular disease (RLS)   ? GI/Hepatic ?Neg liver ROS, GERD  Medicated and Controlled,Colon resection  ?  ?Endo/Other  ?negative endocrine ROS ? Renal/GU ?Renal disease  ?negative genitourinary ?  ?Musculoskeletal ? ?(+) Arthritis , Fibromyalgia -, narcotic dependent ? Abdominal ?  ?Peds ?negative pediatric ROS ?(+)  Hematology ?negative hematology ROS ?(+)   ?Anesthesia Other Findings ?Low back pain ? Reproductive/Obstetrics ?negative OB ROS ? ?  ? ? ? ? ? ? ? ? ? ? ? ? ? ?  ?  ? ? ? ? ? ? ? ?Anesthesia Physical ?Anesthesia Plan ? ?ASA: 2 ? ?Anesthesia Plan: General  ? ?Post-op Pain Management: Minimal or no pain anticipated  ? ?Induction: Intravenous ? ?PONV Risk Score and Plan: Propofol infusion ? ?Airway Management Planned: Nasal Cannula and Natural Airway ? ?Additional Equipment:  ? ?Intra-op Plan:  ? ?Post-operative Plan:  ? ?Informed Consent: I have reviewed the patients History and Physical, chart, labs and discussed the procedure including the risks, benefits and alternatives for the proposed anesthesia with the patient or authorized representative who has indicated his/her understanding and acceptance.  ? ? ? ?Dental  advisory given ? ?Plan Discussed with: CRNA and Surgeon ? ?Anesthesia Plan Comments: (Anticholinergics like glycopyrrolate cause excessive dryness  Of eyes, will avoid these meds)  ? ? ? ? ? ?Anesthesia Quick Evaluation ? ?

## 2022-04-03 NOTE — OR Nursing (Signed)
No cecum. at anastomosis at [phone] ?

## 2022-04-04 ENCOUNTER — Ambulatory Visit (HOSPITAL_BASED_OUTPATIENT_CLINIC_OR_DEPARTMENT_OTHER)
Admission: RE | Admit: 2022-04-04 | Discharge: 2022-04-04 | Disposition: A | Discharge status: Home / Self Care | Payer: Medicare Other | Source: Ambulatory Visit | Attending: Neurosurgery | Admitting: Neurosurgery

## 2022-04-04 DIAGNOSIS — M47819 Spondylosis without myelopathy or radiculopathy, site unspecified: Secondary | ICD-10-CM | POA: Diagnosis not present

## 2022-04-04 DIAGNOSIS — G992 Myelopathy in diseases classified elsewhere: Secondary | ICD-10-CM | POA: Diagnosis present

## 2022-04-04 DIAGNOSIS — M4802 Spinal stenosis, cervical region: Secondary | ICD-10-CM | POA: Insufficient documentation

## 2022-04-04 DIAGNOSIS — M4312 Spondylolisthesis, cervical region: Secondary | ICD-10-CM | POA: Insufficient documentation

## 2022-04-04 LAB — SURGICAL PATHOLOGY

## 2022-04-05 ENCOUNTER — Encounter: Payer: Self-pay | Admitting: Internal Medicine

## 2022-04-09 ENCOUNTER — Encounter (HOSPITAL_COMMUNITY): Payer: Self-pay | Admitting: Internal Medicine

## 2022-04-15 ENCOUNTER — Other Ambulatory Visit (HOSPITAL_COMMUNITY): Payer: Medicare Other

## 2022-08-27 ENCOUNTER — Inpatient Hospital Stay: Payer: Medicare Other | Admitting: Hematology

## 2022-09-10 ENCOUNTER — Other Ambulatory Visit (HOSPITAL_COMMUNITY): Payer: Self-pay | Admitting: Neurosurgery

## 2022-09-10 ENCOUNTER — Other Ambulatory Visit: Payer: Self-pay | Admitting: Neurosurgery

## 2022-09-10 DIAGNOSIS — M48062 Spinal stenosis, lumbar region with neurogenic claudication: Secondary | ICD-10-CM

## 2022-09-19 ENCOUNTER — Ambulatory Visit (HOSPITAL_COMMUNITY)
Admission: RE | Admit: 2022-09-19 | Discharge: 2022-09-19 | Disposition: A | Discharge status: Home / Self Care | Payer: Medicare Other | Source: Ambulatory Visit | Attending: Neurosurgery | Admitting: Neurosurgery

## 2022-09-19 DIAGNOSIS — M48062 Spinal stenosis, lumbar region with neurogenic claudication: Secondary | ICD-10-CM | POA: Diagnosis present

## 2022-09-28 NOTE — Progress Notes (Unsigned)
Primary Care Physician:  Emelia Loron, NP  Primary Gastroenterologist: Cristopher Estimable. Rourk, MD  Patient Location: Home Reason for Visit: dysphagia  Persons present on the virtual encounter, with roles: Patient - [name]; Provider - Venetia Night, NP   Total time (minutes) spent on medical discussion: 13 minutes  Virtual Visit Encounter Note Visit is conducted virtually and was requested by patient.   I connected with [name] on 10/01/22 at 10:30 AM EDT by video and verified that I am speaking with the correct person using two identifiers.   I discussed the limitations, risks, security and privacy concerns of performing an evaluation and management service by video and the availability of in person appointments. I also discussed with the patient that there may be a patient responsible charge related to this service. The patient expressed understanding and agreed to proceed.  Chief Complaint  Patient presents with   Follow-up    Follow up on dysphagia    History of Present Illness: [name] is a 61 y.o. 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  Colonoscopy March 2016 with normal exam and recommended repeat in 5 years. EGD October 2014 with non critical Schatzki ring s/p dilation.   Last seen via video visit March 2023 to discuss TCS and EGD given GERD issues. Noted some solid food dysphagia a few days per week. No liquid dysphagia. Denied constipation. Reported intermittent diarrhea. Rare NSAID use. GERD well controlled on omeprazole 40 mg daily. Scheduled for EGD/ED and TCS.   Colonoscopy 04/03/22:  two 2-10m tubular adenomas in descending colon and a 331mhyperplastic polyp in the rectum. S/p right hemicolectomy. Advised repeat in 5 years.   EGD 04/03/22: mild Schatzki ring s/p dilation. Benign gastric polyps. Normal duodenum  Today: Swallowing was good for a little while after her endoscopy but lately it has been worsening over the last month.   Feels like food is having a hard time going down and chest hurts sometimes when her food does not go down all the way, sometimes she is coughing food back up when she gets strangled, mostly with solids like breads or other sandwiches. Common with meats. No issues with medications. No worsening of her reflux, no burning. Has been under a lot of stress lately due to her back pain as well.   Reports when she gets stressed he band tightens back up. Usually goes about a year or 2 between stretching of her esophagus. States she has had some barium pill esophograms done in the past that were all normal. Had neck surgery and had a cage and disks placed at C4-C6 and feels as though this at times can cause her issues. She would like to proceed with repeat upper endoscopy and not complete BPE.   Medications Current Meds  Medication Sig   albuterol (PROVENTIL HFA;VENTOLIN HFA) 108 (90 Base) MCG/ACT inhaler Inhale 1-2 puffs into the lungs every 6 (six) hours as needed for wheezing.   amphetamine-dextroamphetamine (ADDERALL) 10 MG tablet Take 5-10 mg by mouth daily. For attention   cetirizine (ZYRTEC) 10 MG tablet Take 10 mg by mouth every morning.    Cholecalciferol (VITAMIN  D3) 2000 units TABS Take 2,000 Units by mouth daily.   estradiol (ESTRACE) 2 MG tablet Take 2 mg by mouth daily.   fluticasone (FLONASE) 50 MCG/ACT nasal spray Place 2 sprays into both nostrils daily.   furosemide (LASIX) 40 MG tablet Take 40 mg by mouth daily.    ibuprofen (ADVIL) 100 MG tablet Take 100 mg by mouth every 8 (eight) hours as needed for pain or fever.   omeprazole (PRILOSEC)  40 MG capsule Take 40 mg by mouth daily.   Oxycodone HCl 10 MG TABS Take 10 mg by mouth every 6 (six) hours.   potassium chloride SA (K-DUR,KLOR-CON) 20 MEQ tablet Take 20 mEq by mouth daily.    pramipexole (MIRAPEX) 0.5 MG tablet Take 0.5 mg by mouth at bedtime.      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 Reoperation for her lymph node dissection revealed all nodes to be absolutely negative.    Cervicalgia    COPD (chronic obstructive pulmonary disease) (HCC)    Depression with anxiety    Dysphagia    Fibromyalgia    GERD (gastroesophageal reflux disease)    IBS (irritable bowel syndrome)    Interstitial cystitis    Kidney stones    Migraine    Mitral valve prolapse    PONV (postoperative nausea and vomiting)    PTSD (post-traumatic stress disorder)    Renal failure    RLS (restless legs syndrome)    Shortness of breath    Tobacco use disorder    UTI (lower urinary tract infection)     Past Surgical History:  Procedure Laterality Date   ABDOMINAL HYSTERECTOMY     ABDOMINAL SURGERY     APPENDECTOMY  01/05/2012   with incidental finding of carcinoid   BIOPSY  04/03/2022   Procedure: BIOPSY;  Surgeon: Daneil Dolin, MD;  [facility];  Service: Endoscopy;;   CARDIAC CATHETERIZATION     no stents needed   [place] RESECTION  01/30/2012   Procedure: HAND ASSISTED LAPAROSCOPIC COLON RESECTION;  Surgeon: Jamesetta So, MD;  Location: [facility];  Service: General;  Laterality: N/A;   COLON SURGERY     COLONOSCOPY WITH PROPOFOL N/A 09/29/2013   PJA:SNKNLZ and colonic polyps-removed as described above  Status post right hemicolectomy   COLONOSCOPY WITH PROPOFOL N/A 03/15/2015   Procedure: COLONOSCOPY WITH PROPOFOL;  Surgeon: Daneil Dolin, MD; normal exam.  Repeat in 5 years.   COLONOSCOPY WITH PROPOFOL N/A 04/03/2022   Procedure: COLONOSCOPY WITH PROPOFOL;  Surgeon: Daneil Dolin, MD;  [facility];  Service: Endoscopy;  Laterality: N/A;  10:00am   CYSTOSTOMY W/ BLADDER DILATION     x 52 [place]   ESOPHAGEAL DILATION  01/01/2012   RMR: Normal esophagus - status post dilation with 36 F [place] dilation, gastric polyps benign path   ESOPHAGOGASTRODUODENOSCOPY (EGD) WITH PROPOFOL N/A 09/29/2013   JQB:HALPFXTKWIO appearing Schatzki's ring-status post dilation as described above. Hiatal hernia.   ESOPHAGOGASTRODUODENOSCOPY (EGD) WITH PROPOFOL N/A 04/03/2022   Procedure: ESOPHAGOGASTRODUODENOSCOPY (EGD) WITH  PROPOFOL;  Surgeon: Daneil Dolin, MD;  [facility];  Service: Endoscopy;  Laterality: N/A;   GIVENS CAPSULE STUDY  01/27/2012   normal   HAND SURGERY Bilateral    HEMICOLECTOMY     ileo-colonoscopy  01/01/2012   RMR: multiple colonic polyps, largest polyp in proximal sigmoid colon. Tubulovillous adenoma with high grade dysplasia, tubular adenoma   LAPAROSCOPIC APPENDECTOMY  01/05/2012   Procedure: APPENDECTOMY LAPAROSCOPIC;  Surgeon: Jamesetta So;  Location: [facility];  Service: General;  Laterality: N/A;   [place] DILATION N/A 09/29/2013   Procedure: Venia Minks DILATION;  Surgeon: Daneil Dolin, MD;  Location: [facility];  Service: Endoscopy;  Laterality: N/A;  [place] N/A 04/03/2022   Procedure: Venia Minks DILATION;  Surgeon: Daneil Dolin, MD;  [facility];  Service: Endoscopy;  Laterality: N/A;   PARTIAL COLECTOMY  01/30/2012   Procedure: PARTIAL COLECTOMY;  Surgeon: Jamesetta So, MD;  Location: [facility];  Service: General;  Laterality: N/A;   POLYPECTOMY N/A 09/29/2013   Procedure: POLYPECTOMY;  Surgeon: Daneil Dolin, MD;  Location: [facility];  Service: Endoscopy;  Laterality: N/A;   POLYPECTOMY  04/03/2022   Procedure: POLYPECTOMY;  Surgeon: Daneil Dolin, MD;  [facility];  Service: Endoscopy;;   TONSILLECTOMY      Family History  Problem Relation Age of Onset   Coronary artery disease Mother    Heart attack Mother    Pulmonary embolism Mother    Alcohol abuse Father    COPD Father    Lung disease Father    Asthma Father    Colon cancer Sister        66   Anesthesia problems Neg Hx    Hypotension Neg Hx    Malignant hyperthermia Neg Hx    Pseudochol deficiency Neg Hx    Other Neg Hx    Allergic rhinitis Neg Hx    Angioedema Neg Hx    Atopy Neg Hx    Eczema Neg Hx    Immunodeficiency Neg Hx    Urticaria Neg Hx     Social History   Socioeconomic History   Marital status: Widowed    Spouse name: Not on file   Number of children: 1   Years of education: Not on file   Highest education level: Not on file  Occupational History   Occupation: disabled    Employer: UNEMPLOYED  Tobacco Use   Smoking status: Former    Packs/day: 0.10    Years: 7.00    Total pack years: 0.70    Types: E-cigarettes, Cigarettes   Smokeless tobacco: Current   Tobacco comments:    Vapes occasionally  Vaping Use   Vaping Use: Some days  Substance and Sexual Activity   Alcohol use: No   Drug use: No   Sexual activity: Yes    Birth control/protection: Surgical  Other Topics Concern   Not on file  Social History Narrative   Not on file   Social Determinants of Health   Financial Resource Strain: Not on file  Food Insecurity: Not on file  Transportation Needs: Not on file  Physical Activity: Not on file  Stress: Not on file  Social Connections: Not on file      Review of Systems: Gen: Denies fever, chills, anorexia. Denies fatigue, weakness, weight loss.  CV: Denies chest pain, palpitations, syncope, peripheral edema, and claudication. Resp: Denies dyspnea at rest, cough, wheezing, coughing up  blood,  and pleurisy. GI: see HPI Derm: Denies rash, itching, dry skin Psych: Denies depression, anxiety, memory loss, confusion. No homicidal or suicidal ideation.  Heme: Denies bruising, bleeding, and enlarged lymph nodes.  Observations/Objective: No distress. Alert and oriented. Pleasant. Well nourished. Normal mood and affect. Unable to perform complete physical exam due to video encounter.   Assessment: Dysphagia: History of Schatzki rings in the past with prior dilation. Most recent EGD in April again with a Schatzki's ring requiring dilation. Symptoms have gotten worse over the last month but did well initially after her dilation earlier this year. Feels it is related to her recent stress and has needed repeat EGD in the past due to BPE being normal and then would do fine.  She would prefer to not do BPE and insists on repeat upper endoscopy with dilation.  Stress could be a likely cause of recurrent ring or web however could have a component of dysmotility.  She states her symptoms are very similar to what she was experiencing prior to her last upper endoscopy.  GERD: Denies any breakthrough symptoms.  Well-controlled omeprazole 40 mg daily  History of adenomatous colon polyps: Evidence of tubular adenomas and tubulovillous adenoma with high grade dysplasia in 2013. Normal colonoscopy in 2016. Recent colonoscopy April 2023 with 2 tubular adenomas and a single benign rectal polyp. Due for surveillance in 2028.    Plan:  Proceed with upper endoscopy with esophageal dilation with propofol by Dr. Gala Romney in near future: the risks, benefits, and alternatives have been discussed with the patient in detail. The patient states understanding and desires to proceed. ASA 3 Continue Omeprazole 40 mg daily Adequate chewing precautions discussed. Follow-up 2 months post procedure, appropriate for virtual visit.   Follow Up Instructions:  I discussed the assessment and treatment plan with the  patient. The patient was provided an opportunity to ask questions and all were answered. The patient agreed with the plan and demonstrated an understanding of the instructions.   The patient was advised to call back or seek an in-person evaluation if the symptoms worsen or if the condition fails to improve as anticipated.    Venetia Night, MSN, APRN, FNP-BC, AGACNP-BC [facility]

## 2022-09-29 ENCOUNTER — Inpatient Hospital Stay: Payer: Medicare Other | Attending: Hematology | Admitting: Hematology

## 2022-10-01 ENCOUNTER — Encounter: Payer: Self-pay | Admitting: Gastroenterology

## 2022-10-01 ENCOUNTER — Encounter: Payer: Self-pay | Admitting: *Deleted

## 2022-10-01 ENCOUNTER — Telehealth: Payer: Self-pay | Admitting: *Deleted

## 2022-10-01 ENCOUNTER — Telehealth (INDEPENDENT_AMBULATORY_CARE_PROVIDER_SITE_OTHER): Payer: Medicare Other | Admitting: Gastroenterology

## 2022-10-01 VITALS — Ht 61.5 in | Wt 158.0 lb

## 2022-10-01 DIAGNOSIS — R1310 Dysphagia, unspecified: Secondary | ICD-10-CM

## 2022-10-01 DIAGNOSIS — Z86010 Personal history of colonic polyps: Secondary | ICD-10-CM | POA: Diagnosis not present

## 2022-10-01 DIAGNOSIS — K219 Gastro-esophageal reflux disease without esophagitis: Secondary | ICD-10-CM | POA: Diagnosis not present

## 2022-10-01 NOTE — Telephone Encounter (Signed)
PA: APPROVED Authorization #: [id number]  DOS: 11/05/22-12/28/22

## 2022-10-01 NOTE — Patient Instructions (Addendum)
We are scheduling you for an upper endoscopy with esophageal dilation in the near future with Dr. Gala Romney.  Continue taking omeprazole 40 mg daily.  Please ensure that you chew your food adequately and eat slow, alternating with sips of liquids.  If you begin to feel like food is getting stuck in your esophagus and not able to swallow it further or have regurgitation of food, please had to the ED for further evaluation.  It was a pleasure to see you today. I want to create trusting relationships with patients. If you receive a survey regarding your visit,  I greatly appreciate you taking time to fill this out on paper or through your MyChart. I value your feedback.  Venetia Night, MSN, FNP-BC, AGACNP-BC [facility]

## 2022-10-15 ENCOUNTER — Encounter: Payer: Self-pay | Admitting: *Deleted

## 2022-10-31 NOTE — Patient Instructions (Signed)
[name]  10/31/2022     '@[email or web address]'$ @   Your procedure is scheduled on Wednesday, November 8.  Report to [facility] at 0730 A.M.  Call this number if you have problems the morning of surgery:  [phone]  If you experience any cold or flu symptoms such as cough, fever, chills, shortness of breath, etc. between now and your scheduled surgery, please notify us at the above number.   Remember:  Do not eat or drink after midnight.   Take these medicines the morning of surgery with A SIP OF WATER zyrtec, flonase, prilosec, oxycodone and use nebulizer    Do not wear jewelry, make-up or nail polish.  Do not wear lotions, powders, or perfumes, or deodorant.  Do not shave 48 hours prior to surgery.  Men may shave face and neck.  Do not bring valuables to the hospital.  [facility] is not responsible for any belongings or valuables.  Contacts, dentures or bridgework may not be worn into surgery.  Leave your suitcase in the car.  After surgery it may be brought to your room.  For patients admitted to the hospital, discharge time will be determined by your treatment team.  Patients discharged the day of surgery will not be allowed to drive home.   Name and phone number of your driver:   family Special instructions:  Follow diet instructions in the letter from the office.  Please read over the following fact sheets that you were given. Anesthesia Post-op Instructions and Care and Recovery After Surgery      Upper Endoscopy, Adult, Care After After the procedure, it is common to have a sore throat. It is also common to have: Mild stomach pain or discomfort. Bloating. Nausea. Follow these instructions at home: The instructions below may help you care for yourself at home. Your health care provider may give you more instructions. If you have questions, ask your health care provider. If you were given a sedative during the procedure, it can affect you for several hours. Do  not drive or operate machinery until your health care provider says that it is safe. If you will be going home right after the procedure, plan to have a responsible adult: Take you home from the hospital or clinic. You will not be allowed to drive. Care for you for the time you are told. Follow instructions from your health care provider about what you may eat and drink. Return to your normal activities as told by your health care provider. Ask your health care provider what activities are safe for you. Take over-the-counter and prescription medicines only as told by your health care provider. Contact a health care provider if you: Have a sore throat that lasts longer than one day. Have trouble swallowing. Have a fever. Get help right away if you: Vomit blood or your vomit looks like coffee grounds. Have bloody, black, or tarry stools. Have a very bad sore throat or you cannot swallow. Have difficulty breathing or very bad pain in your chest or abdomen. These symptoms may be an emergency. Get help right away. Call 911. Do not wait to see if the symptoms will go away. Do not drive yourself to the hospital. Summary After the procedure, it is common to have a sore throat, mild stomach discomfort, bloating, and nausea. If you were given a sedative during the procedure, it can affect you for several hours. Do not drive until your health care provider says that it is safe.  Follow instructions from your health care provider about what you may eat and drink. Return to your normal activities as told by your health care provider. This information is not intended to replace advice given to you by your health care provider. Make sure you discuss any questions you have with your health care provider. Document Revised: 03/26/2022 Document Reviewed: 03/26/2022 Elsevier Patient Education  [place] After The following information offers guidance on how to care  for yourself after your procedure. Your health care provider may also give you more specific instructions. If you have problems or questions, contact your health care provider. What can I expect after the procedure? After the procedure, it is common to have: Tiredness. Little or no memory about what happened during or after the procedure. Impaired judgment when it comes to making decisions. Nausea or vomiting. Some trouble with balance. Follow these instructions at home: For the time period you were told by your health care provider:  Rest. Do not participate in activities where you could fall or become injured. Do not drive or use machinery. Do not drink alcohol. Do not take sleeping pills or medicines that cause drowsiness. Do not make important decisions or sign legal documents. Do not take care of children on your own. Medicines Take over-the-counter and prescription medicines only as told by your health care provider. If you were prescribed antibiotics, take them as told by your health care provider. Do not stop using the antibiotic even if you start to feel better. Eating and drinking Follow instructions from your health care provider about what you may eat and drink. Drink enough fluid to keep your urine pale yellow. If you vomit: Drink clear fluids slowly and in small amounts as you are able. Clear fluids include water, ice chips, low-calorie sports drinks, and fruit juice that has water added to it (diluted fruit juice). Eat light and bland foods in small amounts as you are able. These foods include bananas, applesauce, rice, lean meats, toast, and crackers. General instructions  Have a responsible adult stay with you for the time you are told. It is important to have someone help care for you until you are awake and alert. If you have sleep apnea, surgery and some medicines can increase your risk for breathing problems. Follow instructions from your health care provider about  wearing your sleep device: When you are sleeping. This includes during daytime naps. While taking prescription pain medicines, sleeping medicines, or medicines that make you drowsy. Do not use any products that contain nicotine or tobacco. These products include cigarettes, chewing tobacco, and vaping devices, such as e-cigarettes. If you need help quitting, ask your health care provider. Contact a health care provider if: You feel nauseous or vomit every time you eat or drink. You feel light-headed. You are still sleepy or having trouble with balance after 24 hours. You get a rash. You have a fever. You have redness or swelling around the IV site. Get help right away if: You have trouble breathing. You have new confusion after you get home. These symptoms may be an emergency. Get help right away. Call 911. Do not wait to see if the symptoms will go away. Do not drive yourself to the hospital. This information is not intended to replace advice given to you by your health care provider. Make sure you discuss any questions you have with your health care provider. Document Revised: 05/12/2022 Document Reviewed: 05/12/2022 Elsevier Patient Education  2023 Elsevier  Inc.  

## 2022-11-03 ENCOUNTER — Encounter (HOSPITAL_COMMUNITY)
Admission: RE | Admit: 2022-11-03 | Discharge: 2022-11-03 | Disposition: A | Discharge status: Home / Self Care | Payer: Medicare Other | Source: Ambulatory Visit | Attending: Internal Medicine | Admitting: Internal Medicine

## 2022-11-03 DIAGNOSIS — Z01818 Encounter for other preprocedural examination: Secondary | ICD-10-CM

## 2022-11-04 ENCOUNTER — Telehealth (INDEPENDENT_AMBULATORY_CARE_PROVIDER_SITE_OTHER): Payer: Self-pay | Admitting: *Deleted

## 2022-11-04 ENCOUNTER — Encounter: Payer: Self-pay | Admitting: *Deleted

## 2022-11-04 NOTE — Telephone Encounter (Signed)
Pt has been rescheduled for 12/11/22 at 7:30 am with Dr. Gala Romney. New instructions to be mailed along with pre-op date.

## 2022-11-04 NOTE — Telephone Encounter (Signed)
Per endo pt did not show up for PAT and was on for procedure tomorrow. She has been taken off scheduled.   Called pt, LMOVM to call back to reschedule if she wishes to do so.

## 2022-11-05 ENCOUNTER — Encounter: Payer: Self-pay | Admitting: *Deleted

## 2022-11-05 ENCOUNTER — Encounter: Payer: Self-pay | Admitting: Internal Medicine

## 2022-12-02 NOTE — Patient Instructions (Signed)
[name]  12/02/2022     '@PREFPERIOPPHARMACY'$ @   Your procedure is scheduled on  12/11/2022.   Report to [facility] at  0600  A.M.   Call this number if you have problems the morning of surgery:  [phone]  If you experience any cold or flu symptoms such as cough, fever, chills, shortness of breath, etc. between now and your scheduled surgery, please notify us at the above number.   Remember:  Follow the diet instructions given to you by the office.      Use your inhaler before you come and bring your rescue inhaler with you.     Take these medicines the morning of surgery with A SIP OF WATER      zyrtec, omeprazole, oxycodone(if needed), mirapex.     Do not wear jewelry, make-up or nail polish.  Do not wear lotions, powders, or perfumes, or deodorant.  Do not shave 48 hours prior to surgery.  Men may shave face and neck.  Do not bring valuables to the hospital.  [facility] is not responsible for any belongings or valuables.  Contacts, dentures or bridgework may not be worn into surgery.  Leave your suitcase in the car.  After surgery it may be brought to your room.  For patients admitted to the hospital, discharge time will be determined by your treatment team.  Patients discharged the day of surgery will not be allowed to drive home and must have someone with them for 24 hours.    Special instructions:   DO NOT smoke tobacco or vape for 24 hours before your procedure.  Please read over the following fact sheets that you were given. Anesthesia Post-op Instructions and Care and Recovery After Surgery      Upper Endoscopy, Adult, Care After After the procedure, it is common to have a sore throat. It is also common to have: Mild stomach pain or discomfort. Bloating. Nausea. Follow these instructions at home: The instructions below may help you care for yourself at home. Your health care provider may give you more instructions. If you have  questions, ask your health care provider. If you were given a sedative during the procedure, it can affect you for several hours. Do not drive or operate machinery until your health care provider says that it is safe. If you will be going home right after the procedure, plan to have a responsible adult: Take you home from the hospital or clinic. You will not be allowed to drive. Care for you for the time you are told. Follow instructions from your health care provider about what you may eat and drink. Return to your normal activities as told by your health care provider. Ask your health care provider what activities are safe for you. Take over-the-counter and prescription medicines only as told by your health care provider. Contact a health care provider if you: Have a sore throat that lasts longer than one day. Have trouble swallowing. Have a fever. Get help right away if you: Vomit blood or your vomit looks like coffee grounds. Have bloody, black, or tarry stools. Have a very bad sore throat or you cannot swallow. Have difficulty breathing or very bad pain in your chest or abdomen. These symptoms may be an emergency. Get help right away. Call 911. Do not wait to see if the symptoms will go away. Do not drive yourself to the hospital. Summary After the procedure, it is common  to have a sore throat, mild stomach discomfort, bloating, and nausea. If you were given a sedative during the procedure, it can affect you for several hours. Do not drive until your health care provider says that it is safe. Follow instructions from your health care provider about what you may eat and drink. Return to your normal activities as told by your health care provider. This information is not intended to replace advice given to you by your health care provider. Make sure you discuss any questions you have with your health care provider. Document Revised: 03/26/2022 Document Reviewed: 03/26/2022 Elsevier  Patient Education  [place]. Esophageal Dilatation Esophageal dilatation, also called esophageal dilation, is a procedure to widen or open a blocked or narrowed part of the esophagus. The esophagus is the part of the body that moves food and liquid from the mouth to the stomach. You may need this procedure if: You have a buildup of scar tissue in your esophagus that makes it difficult, painful, or impossible to swallow. This can be caused by gastroesophageal reflux disease (GERD). You have cancer of the esophagus. There is a problem with how food moves through your esophagus. In some cases, you may need this procedure repeated at a later time to dilate the esophagus gradually. Tell a health care provider about: Any allergies you have. All medicines you are taking, including vitamins, herbs, eye drops, creams, and over-the-counter medicines. Any problems you or family members have had with anesthetic medicines. Any blood disorders you have. Any surgeries you have had. Any medical conditions you have. Any antibiotic medicines you are required to take before dental procedures. Whether you are pregnant or may be pregnant. What are the risks? Generally, this is a safe procedure. However, problems may occur, including: Bleeding due to a tear in the lining of the esophagus. A hole, or perforation, in the esophagus. What happens before the procedure? Ask your health care provider about: Changing or stopping your regular medicines. This is especially important if you are taking diabetes medicines or blood thinners. Taking medicines such as aspirin and ibuprofen. These medicines can thin your blood. Do not take these medicines unless your health care provider tells you to take them. Taking over-the-counter medicines, vitamins, herbs, and supplements. Follow instructions from your health care provider about eating or drinking restrictions. Plan to have a responsible adult take you home from  the hospital or clinic. Plan to have a responsible adult care for you for the time you are told after you leave the hospital or clinic. This is important. What happens during the procedure? You may be given a medicine to help you relax (sedative). A numbing medicine may be sprayed into the back of your throat, or you may gargle the medicine. Your health care provider may perform the dilatation using various surgical instruments, such as: Simple dilators. This instrument is carefully placed in the esophagus to stretch it. Guided wire bougies. This involves using an endoscope to insert a wire into the esophagus. A dilator is passed over this wire to enlarge the esophagus. Then the wire is removed. Balloon dilators. An endoscope with a small balloon is inserted into the esophagus. The balloon is inflated to stretch the esophagus and open it up. The procedure may vary among health care providers and hospitals. What can I expect after the procedure? Your blood pressure, heart rate, breathing rate, and blood oxygen level will be monitored until you leave the hospital or clinic. Your throat may feel slightly sore  and numb. This will get better over time. You will not be allowed to eat or drink until your throat is no longer numb. When you are able to drink, urinate, and sit on the edge of the bed without nausea or dizziness, you may be able to return home. Follow these instructions at home: Take over-the-counter and prescription medicines only as told by your health care provider. If you were given a sedative during the procedure, it can affect you for several hours. Do not drive or operate machinery until your health care provider says that it is safe. Plan to have a responsible adult care for you for the time you are told. This is important. Follow instructions from your health care provider about any eating or drinking restrictions. Do not use any products that contain nicotine or tobacco, such as  cigarettes, e-cigarettes, and chewing tobacco. If you need help quitting, ask your health care provider. Keep all follow-up visits. This is important. Contact a health care provider if: You have a fever. You have pain that is not relieved by medicine. Get help right away if: You have chest pain. You have trouble breathing. You have trouble swallowing. You vomit blood. You have black, tarry, or bloody stools. These symptoms may represent a serious problem that is an emergency. Do not wait to see if the symptoms will go away. Get medical help right away. Call your local emergency services (911 in the [place] Do not drive yourself to the hospital. Summary Esophageal dilatation, also called esophageal dilation, is a procedure to widen or open a blocked or narrowed part of the esophagus. Plan to have a responsible adult take you home from the hospital or clinic. For this procedure, a numbing medicine may be sprayed into the back of your throat, or you may gargle the medicine. Do not drive or operate machinery until your health care provider says that it is safe. This information is not intended to replace advice given to you by your health care provider. Make sure you discuss any questions you have with your health care provider. Document Revised: 05/02/2020 Document Reviewed: 05/02/2020 Elsevier Patient Education  [place] The following information offers guidance on how to care for yourself after your procedure. Your health care provider may also give you more specific instructions. If you have problems or questions, contact your health care provider. What can I expect after the procedure? After the procedure, it is common to have: Tiredness. Little or no memory about what happened during or after the procedure. Impaired judgment when it comes to making decisions. Nausea or vomiting. Some trouble with balance. Follow these instructions at  home: For the time period you were told by your health care provider:  Rest. Do not participate in activities where you could fall or become injured. Do not drive or use machinery. Do not drink alcohol. Do not take sleeping pills or medicines that cause drowsiness. Do not make important decisions or sign legal documents. Do not take care of children on your own. Medicines Take over-the-counter and prescription medicines only as told by your health care provider. If you were prescribed antibiotics, take them as told by your health care provider. Do not stop using the antibiotic even if you start to feel better. Eating and drinking Follow instructions from your health care provider about what you may eat and drink. Drink enough fluid to keep your urine pale yellow. If you vomit: Drink clear fluids slowly and in  small amounts as you are able. Clear fluids include water, ice chips, low-calorie sports drinks, and fruit juice that has water added to it (diluted fruit juice). Eat light and bland foods in small amounts as you are able. These foods include bananas, applesauce, rice, lean meats, toast, and crackers. General instructions  Have a responsible adult stay with you for the time you are told. It is important to have someone help care for you until you are awake and alert. If you have sleep apnea, surgery and some medicines can increase your risk for breathing problems. Follow instructions from your health care provider about wearing your sleep device: When you are sleeping. This includes during daytime naps. While taking prescription pain medicines, sleeping medicines, or medicines that make you drowsy. Do not use any products that contain nicotine or tobacco. These products include cigarettes, chewing tobacco, and vaping devices, such as e-cigarettes. If you need help quitting, ask your health care provider. Contact a health care provider if: You feel nauseous or vomit every time you eat  or drink. You feel light-headed. You are still sleepy or having trouble with balance after 24 hours. You get a rash. You have a fever. You have redness or swelling around the IV site. Get help right away if: You have trouble breathing. You have new confusion after you get home. These symptoms may be an emergency. Get help right away. Call 911. Do not wait to see if the symptoms will go away. Do not drive yourself to the hospital. This information is not intended to replace advice given to you by your health care provider. Make sure you discuss any questions you have with your health care provider. Document Revised: 05/12/2022 Document Reviewed: 05/12/2022 Elsevier Patient Education  [place].

## 2022-12-04 ENCOUNTER — Encounter (HOSPITAL_COMMUNITY): Payer: Self-pay

## 2022-12-04 ENCOUNTER — Telehealth: Payer: Self-pay | Admitting: *Deleted

## 2022-12-04 ENCOUNTER — Encounter (HOSPITAL_COMMUNITY)
Admission: RE | Admit: 2022-12-04 | Discharge: 2022-12-04 | Disposition: A | Discharge status: Home / Self Care | Payer: Medicare Other | Source: Ambulatory Visit | Attending: Internal Medicine | Admitting: Internal Medicine

## 2022-12-04 NOTE — Telephone Encounter (Signed)
Pt called in and wants to cancel procedure for now. She will call back when she is ready to r/s.

## 2022-12-04 NOTE — Telephone Encounter (Signed)
LMOVM to call back 

## 2022-12-04 NOTE — Telephone Encounter (Signed)
-----   Message from Encarnacion Chu, RN sent at 12/04/2022  9:29 AM EST ----- Regarding: no show Happy Thursday ladies! [name] did not show for her pre-op this morning.

## 2022-12-11 ENCOUNTER — Encounter (HOSPITAL_COMMUNITY): Admission: RE | Payer: Self-pay | Source: Ambulatory Visit

## 2022-12-11 ENCOUNTER — Ambulatory Visit (HOSPITAL_COMMUNITY): Admission: RE | Admit: 2022-12-11 | Payer: Medicare Other | Source: Ambulatory Visit | Admitting: Internal Medicine

## 2022-12-11 SURGERY — ESOPHAGOGASTRODUODENOSCOPY (EGD) WITH PROPOFOL
Anesthesia: Monitor Anesthesia Care

## 2023-01-07 ENCOUNTER — Other Ambulatory Visit (HOSPITAL_COMMUNITY): Payer: Self-pay | Admitting: Pulmonary Disease

## 2023-01-07 DIAGNOSIS — Z1231 Encounter for screening mammogram for malignant neoplasm of breast: Secondary | ICD-10-CM

## 2023-01-12 ENCOUNTER — Ambulatory Visit (HOSPITAL_COMMUNITY): Payer: Medicare Other

## 2023-01-19 ENCOUNTER — Ambulatory Visit (HOSPITAL_COMMUNITY)
Admission: RE | Admit: 2023-01-19 | Discharge: 2023-01-19 | Disposition: A | Discharge status: Home / Self Care | Payer: Medicare Other | Source: Ambulatory Visit | Attending: Pulmonary Disease | Admitting: Pulmonary Disease

## 2023-01-19 DIAGNOSIS — Z1231 Encounter for screening mammogram for malignant neoplasm of breast: Secondary | ICD-10-CM | POA: Diagnosis not present

## 2023-01-21 ENCOUNTER — Encounter (HOSPITAL_COMMUNITY): Payer: Self-pay | Admitting: Nurse Practitioner

## 2023-01-21 ENCOUNTER — Encounter (HOSPITAL_COMMUNITY): Payer: Self-pay | Admitting: Obstetrics and Gynecology

## 2023-01-21 ENCOUNTER — Encounter (HOSPITAL_COMMUNITY): Payer: Self-pay | Admitting: Pulmonary Disease

## 2023-01-26 ENCOUNTER — Other Ambulatory Visit (HOSPITAL_COMMUNITY): Payer: Self-pay | Admitting: Obstetrics and Gynecology

## 2023-01-26 DIAGNOSIS — R928 Other abnormal and inconclusive findings on diagnostic imaging of breast: Secondary | ICD-10-CM

## 2023-01-29 ENCOUNTER — Ambulatory Visit (HOSPITAL_COMMUNITY)
Admission: RE | Admit: 2023-01-29 | Discharge: 2023-01-29 | Disposition: A | Discharge status: Home / Self Care | Payer: Medicare Other | Source: Ambulatory Visit | Attending: Obstetrics and Gynecology | Admitting: Obstetrics and Gynecology

## 2023-01-29 ENCOUNTER — Other Ambulatory Visit (HOSPITAL_COMMUNITY): Payer: Self-pay | Admitting: Obstetrics and Gynecology

## 2023-01-29 DIAGNOSIS — R928 Other abnormal and inconclusive findings on diagnostic imaging of breast: Secondary | ICD-10-CM | POA: Insufficient documentation

## 2023-02-10 ENCOUNTER — Ambulatory Visit (HOSPITAL_COMMUNITY)
Admission: RE | Admit: 2023-02-10 | Discharge: 2023-02-10 | Disposition: A | Discharge status: Home / Self Care | Payer: Medicare Other | Source: Ambulatory Visit | Attending: Obstetrics and Gynecology | Admitting: Obstetrics and Gynecology

## 2023-02-10 ENCOUNTER — Other Ambulatory Visit (HOSPITAL_COMMUNITY): Payer: Self-pay | Admitting: Obstetrics and Gynecology

## 2023-02-10 ENCOUNTER — Encounter (HOSPITAL_COMMUNITY): Payer: Self-pay

## 2023-02-10 DIAGNOSIS — R928 Other abnormal and inconclusive findings on diagnostic imaging of breast: Secondary | ICD-10-CM | POA: Insufficient documentation

## 2023-02-10 HISTORY — PX: BREAST BIOPSY: SHX20

## 2023-02-10 MED ORDER — LIDOCAINE-EPINEPHRINE (PF) 1 %-1:200000 IJ SOLN
INTRAMUSCULAR | Status: AC
  Administered 2023-02-10: 10 mL
  Filled 2023-02-10: qty 30

## 2023-02-10 MED ORDER — LIDOCAINE HCL (PF) 2 % IJ SOLN
INTRAMUSCULAR | Status: AC
  Administered 2023-02-10: 10 mL
  Filled 2023-02-10: qty 10

## 2023-02-10 NOTE — Progress Notes (Signed)
PT tolerated right breast biopsy well today with NAD noted. PT verbalized understanding of discharge instructions. PT ambulated back to the mammogram area this time and given ice packs.

## 2023-02-11 LAB — SURGICAL PATHOLOGY

## 2023-03-11 ENCOUNTER — Other Ambulatory Visit (HOSPITAL_COMMUNITY): Payer: Medicare Other

## 2023-07-20 ENCOUNTER — Ambulatory Visit: Payer: Medicare Other | Admitting: Podiatry

## 2024-02-19 ENCOUNTER — Other Ambulatory Visit (HOSPITAL_COMMUNITY): Payer: Self-pay | Admitting: Obstetrics and Gynecology

## 2024-02-19 ENCOUNTER — Encounter (HOSPITAL_COMMUNITY): Payer: Self-pay | Admitting: Pulmonary Disease

## 2024-02-19 DIAGNOSIS — Z1231 Encounter for screening mammogram for malignant neoplasm of breast: Secondary | ICD-10-CM

## 2024-03-09 ENCOUNTER — Ambulatory Visit (HOSPITAL_COMMUNITY): Payer: Medicare Other

## 2024-03-15 ENCOUNTER — Other Ambulatory Visit (HOSPITAL_COMMUNITY): Payer: Self-pay | Admitting: Obstetrics and Gynecology

## 2024-03-15 ENCOUNTER — Encounter (HOSPITAL_COMMUNITY): Payer: Self-pay | Admitting: Obstetrics and Gynecology

## 2024-03-15 DIAGNOSIS — N644 Mastodynia: Secondary | ICD-10-CM

## 2024-04-07 ENCOUNTER — Ambulatory Visit (HOSPITAL_COMMUNITY)

## 2024-04-07 ENCOUNTER — Inpatient Hospital Stay (HOSPITAL_COMMUNITY): Admission: RE | Admit: 2024-04-07 | Source: Ambulatory Visit

## 2024-04-21 ENCOUNTER — Inpatient Hospital Stay (HOSPITAL_COMMUNITY): Admission: RE | Admit: 2024-04-21 | Source: Ambulatory Visit

## 2024-04-21 ENCOUNTER — Ambulatory Visit (HOSPITAL_COMMUNITY)

## 2024-04-30 ENCOUNTER — Emergency Department (HOSPITAL_COMMUNITY)

## 2024-04-30 ENCOUNTER — Emergency Department (HOSPITAL_COMMUNITY)
Admission: EM | Admit: 2024-04-30 | Discharge: 2024-04-30 | Disposition: A | Discharge status: Home / Self Care | Attending: Emergency Medicine | Admitting: Emergency Medicine

## 2024-04-30 ENCOUNTER — Encounter (HOSPITAL_COMMUNITY): Payer: Self-pay | Admitting: Emergency Medicine

## 2024-04-30 ENCOUNTER — Other Ambulatory Visit: Payer: Self-pay

## 2024-04-30 DIAGNOSIS — R1084 Generalized abdominal pain: Secondary | ICD-10-CM | POA: Diagnosis present

## 2024-04-30 LAB — CBC WITH DIFFERENTIAL/PLATELET
Abs Immature Granulocytes: 0.02 10*3/uL (ref 0.00–0.07)
Basophils Absolute: 0.1 10*3/uL (ref 0.0–0.1)
Basophils Relative: 1 %
Eosinophils Absolute: 0.3 10*3/uL (ref 0.0–0.5)
Eosinophils Relative: 3 %
HCT: 37.6 % (ref 36.0–46.0)
Hemoglobin: 12.7 g/dL (ref 12.0–15.0)
Immature Granulocytes: 0 %
Lymphocytes Relative: 39 %
Lymphs Abs: 3.3 10*3/uL (ref 0.7–4.0)
MCH: 29.6 pg (ref 26.0–34.0)
MCHC: 33.8 g/dL (ref 30.0–36.0)
MCV: 87.6 fL (ref 80.0–100.0)
Monocytes Absolute: 0.5 10*3/uL (ref 0.1–1.0)
Monocytes Relative: 6 %
Neutro Abs: 4.3 10*3/uL (ref 1.7–7.7)
Neutrophils Relative %: 51 %
Platelets: 311 10*3/uL (ref 150–400)
RBC: 4.29 MIL/uL (ref 3.87–5.11)
RDW: 12.4 % (ref 11.5–15.5)
WBC: 8.5 10*3/uL (ref 4.0–10.5)
nRBC: 0 % (ref 0.0–0.2)

## 2024-04-30 LAB — URINALYSIS, ROUTINE W REFLEX MICROSCOPIC
Bilirubin Urine: NEGATIVE
Glucose, UA: NEGATIVE mg/dL
Hgb urine dipstick: NEGATIVE
Ketones, ur: NEGATIVE mg/dL
Leukocytes,Ua: NEGATIVE
Nitrite: NEGATIVE
Protein, ur: NEGATIVE mg/dL
Specific Gravity, Urine: >1.046 — ABNORMAL HIGH (ref 1.005–1.030)
pH: 6 (ref 5.0–8.0)

## 2024-04-30 LAB — COMPREHENSIVE METABOLIC PANEL WITH GFR
ALT: 14 U/L (ref 0–44)
AST: 14 U/L — ABNORMAL LOW (ref 15–41)
Albumin: 3.5 g/dL (ref 3.5–5.0)
Alkaline Phosphatase: 69 U/L (ref 38–126)
Anion gap: 9 (ref 5–15)
BUN: 16 mg/dL (ref 8–23)
CO2: 26 mmol/L (ref 22–32)
Calcium: 9 mg/dL (ref 8.9–10.3)
Chloride: 100 mmol/L (ref 98–111)
Creatinine, Ser: 0.94 mg/dL (ref 0.44–1.00)
GFR, Estimated: >60 mL/min (ref >60)
Glucose, Bld: 114 mg/dL — ABNORMAL HIGH (ref 70–99)
Potassium: 3.7 mmol/L (ref 3.5–5.1)
Sodium: 135 mmol/L (ref 135–145)
Total Bilirubin: 0.3 mg/dL (ref 0.0–1.2)
Total Protein: 6.6 g/dL (ref 6.5–8.1)

## 2024-04-30 LAB — LIPASE, BLOOD: Lipase: 27 U/L (ref 11–51)

## 2024-04-30 MED ORDER — MORPHINE SULFATE (PF) 4 MG/ML IV SOLN
4.0000 mg | Freq: Once | INTRAVENOUS | Status: AC
  Administered 2024-04-30: 4 mg via INTRAVENOUS
  Filled 2024-04-30: qty 1

## 2024-04-30 MED ORDER — SUCRALFATE 1 G PO TABS: 1.0000 g | ORAL_TABLET | Freq: Three times a day (TID) | ORAL | 0 refills | Status: DC

## 2024-04-30 MED ORDER — DICYCLOMINE HCL 20 MG PO TABS
20.0000 mg | ORAL_TABLET | Freq: Two times a day (BID) | ORAL | 0 refills | Status: DC
Start: 2024-04-30 — End: 2024-08-09

## 2024-04-30 MED ORDER — DIPHENHYDRAMINE HCL 50 MG/ML IJ SOLN
25.0000 mg | Freq: Once | INTRAMUSCULAR | Status: AC
  Administered 2024-04-30: 25 mg via INTRAVENOUS
  Filled 2024-04-30: qty 1

## 2024-04-30 MED ORDER — HYDROMORPHONE HCL 1 MG/ML IJ SOLN
1.0000 mg | Freq: Once | INTRAMUSCULAR | Status: AC
  Administered 2024-04-30: 1 mg via INTRAVENOUS
  Filled 2024-04-30: qty 1

## 2024-04-30 MED ORDER — SODIUM CHLORIDE 0.9 % IV BOLUS
1000.0000 mL | Freq: Once | INTRAVENOUS | Status: AC
  Administered 2024-04-30: 1000 mL via INTRAVENOUS

## 2024-04-30 MED ORDER — ONDANSETRON HCL 4 MG/2ML IJ SOLN
4.0000 mg | Freq: Once | INTRAMUSCULAR | Status: AC
  Administered 2024-04-30: 4 mg via INTRAVENOUS
  Filled 2024-04-30: qty 2

## 2024-04-30 MED ORDER — DICYCLOMINE HCL 10 MG PO CAPS
10.0000 mg | ORAL_CAPSULE | Freq: Once | ORAL | Status: AC
  Administered 2024-04-30: 10 mg via ORAL
  Filled 2024-04-30: qty 1

## 2024-04-30 MED ORDER — IOHEXOL 300 MG/ML  SOLN
100.0000 mL | Freq: Once | INTRAMUSCULAR | Status: AC | PRN
  Administered 2024-04-30: 100 mL via INTRAVENOUS

## 2024-04-30 NOTE — ED Triage Notes (Signed)
 Pt to ER states she ahs been being treated for UTI for last week but continues with abdominal pain, bloating, back pain and nausea.  States took a course of Levaquin  and then started Macrobid yesterday.

## 2024-04-30 NOTE — ED Provider Notes (Signed)
 [place] EMERGENCY DEPARTMENT AT [facility] Provider Note   CSN: [id number] Arrival date & time: 04/30/24  1823     History  Chief Complaint  Patient presents with   Abdominal Pain    [name] is a 63 y.o. female.  Patient is a 63 year old female who presents emergency department the chief complaint of right-sided abdominal pain which has been ongoing for approximate the past 3 weeks.  Patient notes that she has been seen by her primary care doctor and has been treated for urinary tract infection with Levaquin  and Macrobid.  Patient notes that she is continue to have worsening symptoms despite this.  She denies any associated fever or chills.  She admits to associated nausea without vomiting.  She has had no constipation or diarrhea.  She denies any associated dizziness, lightheadedness or syncope.  She has had no associated chest pain or shortness of breath.  She denies any recent falls or blunt abdominal wall trauma.   Abdominal Pain      Home Medications Prior to Admission medications   Medication Sig Start Date End Date Taking? Authorizing Provider  albuterol  (PROVENTIL  HFA;VENTOLIN  HFA) 108 (90 Base) MCG/ACT inhaler Inhale 1-2 puffs into the lungs every 6 (six) hours as needed for wheezing. 01/17/18   Alissa April, MD  amphetamine -dextroamphetamine  (ADDERALL) 10 MG tablet Take 5-10 mg by mouth daily. For attention 10/31/11   [provider]  cetirizine (ZYRTEC) 10 MG tablet Take 10 mg by mouth every morning.     [provider]  Cholecalciferol (VITAMIN D3) 2000 units TABS Take 2,000 Units by mouth daily.    [provider]  estradiol  (ESTRACE ) 2 MG tablet Take 2 mg by mouth daily. 09/08/16   [provider]  fluticasone  (FLONASE ) 50 MCG/ACT nasal spray Place 2 sprays into both nostrils daily. 08/07/16   Rochester Chuck, MD  furosemide (LASIX) 40 MG tablet Take 40 mg by mouth daily.     [provider]  ibuprofen   (ADVIL ) 100 MG tablet Take 100 mg by mouth every 8 (eight) hours as needed for pain or fever.    [provider]  omeprazole  (PRILOSEC) 40 MG capsule Take 40 mg by mouth daily.    [provider]  Oxycodone  HCl 10 MG TABS Take 10 mg by mouth every 6 (six) hours.    [provider]  potassium chloride  SA (K-DUR,KLOR-CON ) 20 MEQ tablet Take 20 mEq by mouth daily.  08/19/13   [provider]  pramipexole  (MIRAPEX ) 1.5 MG tablet Take 1.5 mg by mouth daily. 10/04/22   [provider]      Allergies    Ativan  [lorazepam ], Clarithromycin, Ibuprofen , Pseudoephedrine, Reglan  [metoclopramide ], Stadol [butorphanol tartrate], Sulfa antibiotics, Toradol  [ketorolac  tromethamine ], Factive [gemifloxacin mesylate], Other, Prochlorperazine edisylate, Promethazine hcl, Tofranil-pm, and Trimethobenzamide hcl    Review of Systems   Review of Systems  Gastrointestinal:  Positive for abdominal pain.  All other systems reviewed and are negative.   Physical Exam Updated Vital Signs BP (!) 152/74   Pulse 83   Temp 97.9 F (36.6 C) (Oral)   Resp 18   Ht 5\' 1"  (1.549 m)   Wt 74.8 kg   LMP 12/29/1993   SpO2 98%   BMI 31.18 kg/m  Physical Exam Vitals and nursing note reviewed.  Constitutional:      Appearance: Normal appearance.  HENT:     Head: Normocephalic and atraumatic.     Nose: Nose normal.  Mouth/Throat:     Mouth: Mucous membranes are moist.  Eyes:     Extraocular Movements: Extraocular movements intact.     Conjunctiva/sclera: Conjunctivae normal.     Pupils: Pupils are equal, round, and reactive to light.  Cardiovascular:     Rate and Rhythm: Normal rate and regular rhythm.     Pulses: Normal pulses.     Heart sounds: Normal heart sounds. No murmur heard.    No gallop.  Pulmonary:     Effort: Pulmonary effort is normal. No respiratory distress.     Breath sounds: Normal breath sounds. No stridor. No wheezing, rhonchi or rales.  Abdominal:      General: Abdomen is flat. Bowel sounds are normal. There is no distension. There are no signs of injury.     Palpations: Abdomen is soft.     Tenderness: There is abdominal tenderness in the right upper quadrant and right lower quadrant. There is no guarding.     Hernia: No hernia is present.  Musculoskeletal:        General: Normal range of motion.     Cervical back: Normal range of motion and neck supple.  Skin:    General: Skin is warm and dry.  Neurological:     General: No focal deficit present.     Mental Status: She is alert and oriented to person, place, and time. Mental status is at baseline.  Psychiatric:        Mood and Affect: Mood normal.        Behavior: Behavior normal.        Thought Content: Thought content normal.        Judgment: Judgment normal.     ED Results / Procedures / Treatments   Labs (all labs ordered are listed, but only abnormal results are displayed) Labs Reviewed  URINE CULTURE  COMPREHENSIVE METABOLIC PANEL WITH GFR  LIPASE, BLOOD  CBC WITH DIFFERENTIAL/PLATELET  URINALYSIS, ROUTINE W REFLEX MICROSCOPIC    EKG None  Radiology No results found.  Procedures Procedures    Medications Ordered in ED Medications  morphine  (PF) 4 MG/ML injection 4 mg (has no administration in time range)  ondansetron  (ZOFRAN ) injection 4 mg (has no administration in time range)    ED Course/ Medical Decision Making/ A&P                                 Medical Decision Making Amount and/or Complexity of Data Reviewed Labs: ordered. Radiology: ordered.  Risk Prescription drug management.   This patient presents to the ED for concern of abdominal pain differential diagnosis includes acute cholecystitis, small bowel obstruction, diverticulitis, ovarian torsion or cyst, tubo-ovarian abscess, pyelonephritis, kidney stone, mesenteric ischemia, pancreatitis    Additional history obtained:  Additional history obtained from medical  records External records from outside source obtained and reviewed including none   Lab Tests:  I Ordered, and personally interpreted labs.  The pertinent results include: No leukocytosis, no anemia, normal kidney function liver function, normal electrolytes, negative lipase, unremarkable urinalysis   Imaging Studies ordered:  I ordered imaging studies including CT scan of the abdomen and pelvis I independently visualized and interpreted imaging which showed no acute intra-abdominal process I agree with the radiologist interpretation   Medicines ordered and prescription drug management:  I ordered medication including Bentyl, Dilaudid , IV fluids, morphine , Zofran  for abdominal pain Reevaluation of the patient after these medicines showed that the  patient improved I have reviewed the patients home medicines and have made adjustments as needed   Problem List / ED Course:  Patient notes that she is feeling much better at this time and is stable for discharge home.  Discussed with patient's overall workup in the emergency department has been unremarkable.  Do suspect that she may be suffering from underlying spasms within her bowels at this point.  Did discuss with patient need for close follow-up with her gastroenterologist on outpatient basis for reevaluation.  No acute surgical process was noted on CT scan of the abdomen and pelvis.  Urinalysis was unremarkable with no indication of urinary tract infection.  Blood work demonstrated no acute abnormalities.  Will continue symptomatic treatment on outpatient basis.  Strict return precautions were discussed for any new or worsening symptoms.  Patient voiced understanding to the plan and had no additional questions.   Social Determinants of Health:  None           Final Clinical Impression(s) / ED Diagnoses Final diagnoses:  None    Rx / DC Orders ED Discharge Orders     None         Emmalene Hare 04/30/24 2056    Guadalupe Lee, MD 05/01/24 2042

## 2024-04-30 NOTE — Discharge Instructions (Addendum)
 Please follow-up closely with your primary care doctor and with GI on an outpatient basis.  Return to emergency department immediately for any new or worsening symptoms.

## 2024-04-30 NOTE — ED Notes (Signed)
 Patient transported to CT

## 2024-05-02 LAB — URINE CULTURE: Culture: NO GROWTH

## 2024-05-03 ENCOUNTER — Ambulatory Visit: Admitting: Internal Medicine

## 2024-05-05 ENCOUNTER — Encounter (HOSPITAL_COMMUNITY): Payer: Self-pay

## 2024-05-10 NOTE — Progress Notes (Deleted)
 GI Office Note    Referring Provider: Forest Idol, NP Primary Care Physician:  Forest Idol, NP Primary Gastroenterologist: Windsor Hatcher.Rourk, MD  Date:  05/10/2024  ID:  [name], DOB 1961/12/14, MRN [id number]   Chief Complaint   No chief complaint on file.  History of Present Illness  [name] is a 63 y.o. 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 Noted some solid food dysphagia a few days per week. No liquid dysphagia. Denied constipation. Reported intermittent diarrhea. Rare NSAID use. GERD well controlled on omeprazole  40 mg daily. Scheduled for EGD/ED and TCS.    Colonoscopy 04/03/22:  -two 2-60mm tubular adenomas in descending colon  -3mm hyperplastic polyp in the rectum.  -S/p right hemicolectomy.  -Advised repeat in 5 years.    EGD 04/03/22:  -mild Schatzki ring s/p dilation.  -Benign gastric polyps.  -Normal duodenum  Last office visit 10/01/22 completed virtually.  Reported worsening dysphagia over the last month.  Feeling food is hard to go down her chest hurts at times.  Occasional regurgitation.  Occurring mostly with meats and with breads.  No issues with medications or liquids.  Reportedly going usually about 2 years between stretching.  Had barium esophagram's in the past that were all normal.  Reports history of prior neck surgery with fusion of her discs.  She declined BPE and wanted to proceed with repeat EGD.  She was scheduled with EGD with  dilation and advised continue Meprazole 40 mg once daily.  Follow-up post procedure.   She was attempted to be contacted to get scheduled for her upper endoscopy and procedure was canceled and attempt rescheduling.  Procedure was never completed and she stated she will call back to reschedule when she was ready.  Today:    Wt Readings from Last 3 Encounters:  04/30/24 165 lb (74.8 kg)  10/01/22 158 lb (71.7 kg)  04/01/22 159 lb (72.1 kg)    Current Outpatient Medications  Medication Sig Dispense Refill   amphetamine -dextroamphetamine  (ADDERALL) 10 MG tablet Take 5-10 mg by mouth daily. For attention     cetirizine (ZYRTEC) 10 MG tablet Take 10 mg by mouth every morning.      Cholecalciferol (VITAMIN D3) 2000 units TABS Take 2,000 Units by mouth daily.     dicyclomine  (BENTYL ) 20 MG tablet Take 1 tablet (20 mg total) by mouth 2 (two) times daily. 20 tablet 0   estradiol  (ESTRACE ) 2 MG tablet Take 2 mg by mouth daily.     fluticasone  (FLONASE ) 50 MCG/ACT nasal spray Place 2 sprays into both nostrils daily. 16 g 5   furosemide (LASIX) 40 MG tablet Take 40 mg by mouth daily.      ibuprofen  (ADVIL ) 100 MG tablet Take 100 mg by mouth every 8 (eight) hours as needed for pain or fever.     levofloxacin  (LEVAQUIN ) 500 MG tablet Take 500 mg by mouth daily.     metroNIDAZOLE  (METROGEL ) 0.75 % vaginal gel Place 1 Applicatorful vaginally at bedtime.     nitrofurantoin,  macrocrystal-monohydrate, (MACROBID) 100 MG capsule Take 100 mg by mouth 2 (two) times daily.     omeprazole  (PRILOSEC) 40 MG capsule Take 40 mg by mouth daily.     Oxycodone  HCl 10 MG TABS Take 10 mg by mouth every 6 (six) hours.     potassium chloride  SA (K-DUR,KLOR-CON ) 20 MEQ tablet Take 20 mEq by mouth daily.      pramipexole  (MIRAPEX ) 1.5 MG tablet Take 1.5 mg by mouth daily.     PROAIR  RESPICLICK 108 (90 Base) MCG/ACT AEPB Inhale 1 puff into the lungs every 4 (four) hours as needed (wheezing, shortness of breath).      sucralfate  (CARAFATE ) 1 g tablet Take 1 tablet (1 g total) by mouth 4 (four) times daily -  with meals and at bedtime. 30 tablet 0   No current facility-administered medications for this visit.    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 Reoperation for her lymph node dissection revealed all nodes to be absolutely negative.    Cervicalgia    COPD (chronic obstructive pulmonary disease) (HCC)    Depression with anxiety    Dysphagia    Fibromyalgia    GERD (gastroesophageal reflux disease)    IBS (irritable bowel syndrome)    Interstitial cystitis    Kidney stones    Migraine    Mitral valve prolapse    PONV (postoperative nausea and vomiting)    PTSD (post-traumatic stress disorder)    Renal failure    RLS (restless legs syndrome)    Shortness of breath    Tobacco use disorder    UTI (lower urinary tract infection)     Past Surgical History:  Procedure Laterality Date   ABDOMINAL HYSTERECTOMY     ABDOMINAL SURGERY     APPENDECTOMY  01/05/2012   with incidental finding of carcinoid   BIOPSY  04/03/2022   Procedure: BIOPSY;  Surgeon: Suzette Espy, MD;  [facility];  Service: Endoscopy;;   BREAST BIOPSY Right 02/10/2023   US  RT BREAST BX W LOC DEV 1ST LESION IMG BX SPEC US  GUIDE 02/10/2023 [facility]   CARDIAC CATHETERIZATION     no stents needed   CARDIAC SURGERY     CHOLECYSTECTOMY     COLON RESECTION  01/30/2012   Procedure: HAND ASSISTED LAPAROSCOPIC COLON RESECTION;  Surgeon: Beau Bound, MD;  Location: [facility];  Service: General;  Laterality: N/A;   COLON SURGERY     COLONOSCOPY WITH PROPOFOL  N/A 09/29/2013   [id number] and colonic  polyps-removed as described above Status post right hemicolectomy   COLONOSCOPY WITH PROPOFOL  N/A 03/15/2015   Procedure: COLONOSCOPY WITH PROPOFOL ;  Surgeon: Suzette Espy, MD; normal exam.  Repeat in 5 years.   COLONOSCOPY WITH PROPOFOL  N/A 04/03/2022   Procedure: COLONOSCOPY WITH PROPOFOL ;  Surgeon: Suzette Espy, MD;  [facility];  Service: Endoscopy;  Laterality: N/A;  10:00am   CYSTOSTOMY W/ BLADDER DILATION     x 52 Solomon   ESOPHAGEAL DILATION  01/01/2012   RMR: Normal esophagus - status post dilation with 54 F Maloney dilation, gastric polyps benign path  ESOPHAGOGASTRODUODENOSCOPY (EGD) WITH PROPOFOL  N/A 09/29/2013   [id number] appearing Schatzki's ring-status post dilation as described above. Hiatal hernia.   ESOPHAGOGASTRODUODENOSCOPY (EGD) WITH PROPOFOL  N/A 04/03/2022   Procedure: ESOPHAGOGASTRODUODENOSCOPY (EGD) WITH PROPOFOL ;  Surgeon: Suzette Espy, MD;  [facility];  Service: Endoscopy;  Laterality: N/A;   GIVENS CAPSULE STUDY  01/27/2012   normal   HAND SURGERY Bilateral    HEMICOLECTOMY     ileo-colonoscopy  01/01/2012   RMR: multiple colonic polyps, largest polyp in proximal sigmoid colon. Tubulovillous adenoma with high grade dysplasia, tubular adenoma   LAPAROSCOPIC APPENDECTOMY  01/05/2012   Procedure: APPENDECTOMY LAPAROSCOPIC;  Surgeon: Beau Bound;  Location: [facility];  Service: General;  Laterality: N/A;   MALONEY DILATION N/A 09/29/2013   Procedure: Londa Rival DILATION;  Surgeon: Suzette Espy, MD;  Location: [facility];  Service: Endoscopy;  Laterality: N/A;  54   MALONEY DILATION N/A 04/03/2022   Procedure: Londa Rival DILATION;  Surgeon: Suzette Espy, MD;  [facility];  Service: Endoscopy;  Laterality: N/A;   PARTIAL COLECTOMY  01/30/2012   Procedure: PARTIAL COLECTOMY;  Surgeon: Beau Bound, MD;  Location: [facility];  Service: General;  Laterality: N/A;   POLYPECTOMY N/A 09/29/2013   Procedure: POLYPECTOMY;  Surgeon:  Suzette Espy, MD;  Location: [facility];  Service: Endoscopy;  Laterality: N/A;   POLYPECTOMY  04/03/2022   Procedure: POLYPECTOMY;  Surgeon: Suzette Espy, MD;  [facility];  Service: Endoscopy;;   TONSILLECTOMY      Family History  Problem Relation Age of Onset   Coronary artery disease Mother    Heart attack Mother    Pulmonary embolism Mother    Alcohol abuse Father    COPD Father    Lung disease Father    Asthma Father    Colon cancer Sister        68   Anesthesia problems Neg Hx    Hypotension Neg Hx    Malignant hyperthermia Neg Hx    Pseudochol deficiency Neg Hx    Other Neg Hx    Allergic rhinitis Neg Hx    Angioedema Neg Hx    Atopy Neg Hx    Eczema Neg Hx    Immunodeficiency Neg Hx    Urticaria Neg Hx     Allergies as of 05/11/2024 - Review Complete 04/30/2024  Allergen Reaction Noted   Ativan  [lorazepam ] Other (See Comments) 01/29/2012   Prochlorperazine edisylate Anaphylaxis, Anxiety, Dermatitis, and Other (See Comments) 11/23/2020   Promethazine hcl Anaphylaxis, Anxiety, and Dermatitis 11/23/2020   Trimethobenzamide hcl Anaphylaxis and Anxiety 11/23/2020   Butorphanol Nausea And Vomiting and Nausea Only 07/19/2012   Clarithromycin Itching, Nausea And Vomiting, and Nausea Only 10/15/2021   Ibuprofen  Other (See Comments) 01/08/2013   Pseudoephedrine Hives    Reglan  [metoclopramide ] Other (See Comments) 06/14/2013   Stadol [butorphanol tartrate] Nausea And Vomiting 07/19/2012   Sulfa antibiotics Nausea And Vomiting 08/28/2011   Toradol  [ketorolac  tromethamine ] Other (See Comments) 07/30/2012   Factive [gemifloxacin mesylate] Rash 08/28/2011   Other Rash 07/19/2012   Tofranil-pm Rash 01/02/2012    Social History   Socioeconomic History   Marital status: Widowed    Spouse name: Not on file   Number of children: 1   Years of education: Not on file   Highest education level: Not on file  Occupational History   Occupation: disabled     Employer: UNEMPLOYED  Tobacco Use   Smoking status: Former  Current packs/day: 0.10    Average packs/day: 0.1 packs/day for 7.0 years (0.7 ttl pk-yrs)    Types: E-cigarettes, Cigarettes   Smokeless tobacco: Current   Tobacco comments:    Vapes occasionally  Vaping Use   Vaping status: Some Days  Substance and Sexual Activity   Alcohol use: No   Drug use: No   Sexual activity: Yes    Birth control/protection: Surgical  Other Topics Concern   Not on file  Social History Narrative   Not on file   Social Drivers of Health   Financial Resource Strain: Not on file  Food Insecurity: Not on file  Transportation Needs: Not on file  Physical Activity: Not on file  Stress: Not on file  Social Connections: Not on file   Review of Systems   Gen: Denies fever, chills, anorexia. Denies fatigue, weakness, weight loss.  CV: Denies chest pain, palpitations, syncope, peripheral edema, and claudication. Resp: Denies dyspnea at rest, cough, wheezing, coughing up blood, and pleurisy. GI: See HPI Derm: Denies rash, itching, dry skin Psych: Denies depression, anxiety, memory loss, confusion. No homicidal or suicidal ideation.  Heme: Denies bruising, bleeding, and enlarged lymph nodes.  Physical Exam   LMP 12/29/1993   General:   Alert and oriented. No distress noted. Pleasant and cooperative.  Head:  Normocephalic and atraumatic. Eyes:  Conjuctiva clear without scleral icterus. Mouth:  Oral mucosa pink and moist. Good dentition. No lesions. Lungs:  Clear to auscultation bilaterally. No wheezes, rales, or rhonchi. No distress.  Heart:  S1, S2 present without murmurs appreciated.  Abdomen:  +BS, soft, non-tender and non-distended. No rebound or guarding. No HSM or masses noted. Rectal: *** Msk:  Symmetrical without gross deformities. Normal posture. Extremities:  Without edema. Neurologic:  Alert and  oriented x4 Psych:  Alert and cooperative. Normal mood and affect.  Assessment   [name] is a 63 y.o. 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  GERD:    Dysphagia:     Due for surveillance colonoscopy in 2028.  PLAN   ***     Julian Obey, MSN, FNP-BC, AGACNP-BC [facility]

## 2024-05-11 ENCOUNTER — Ambulatory Visit: Admitting: Gastroenterology

## 2024-05-12 ENCOUNTER — Ambulatory Visit (HOSPITAL_COMMUNITY)
Admission: RE | Admit: 2024-05-12 | Discharge: 2024-05-12 | Disposition: A | Discharge status: Home / Self Care | Source: Ambulatory Visit | Attending: Obstetrics and Gynecology | Admitting: Obstetrics and Gynecology

## 2024-05-12 ENCOUNTER — Encounter (HOSPITAL_COMMUNITY): Payer: Self-pay

## 2024-05-12 DIAGNOSIS — N644 Mastodynia: Secondary | ICD-10-CM | POA: Insufficient documentation

## 2024-05-16 ENCOUNTER — Ambulatory Visit: Admitting: Internal Medicine

## 2024-07-08 ENCOUNTER — Other Ambulatory Visit: Payer: Self-pay

## 2024-07-08 ENCOUNTER — Emergency Department (HOSPITAL_COMMUNITY)
Admission: EM | Admit: 2024-07-08 | Discharge: 2024-07-09 | Discharge status: Against Medical Advice | Attending: Emergency Medicine | Admitting: Emergency Medicine

## 2024-07-08 ENCOUNTER — Encounter (HOSPITAL_COMMUNITY): Payer: Self-pay | Admitting: Emergency Medicine

## 2024-07-08 DIAGNOSIS — K047 Periapical abscess without sinus: Secondary | ICD-10-CM | POA: Diagnosis not present

## 2024-07-08 DIAGNOSIS — Z5321 Procedure and treatment not carried out due to patient leaving prior to being seen by health care provider: Secondary | ICD-10-CM | POA: Insufficient documentation

## 2024-07-08 DIAGNOSIS — K0889 Other specified disorders of teeth and supporting structures: Secondary | ICD-10-CM | POA: Diagnosis present

## 2024-07-08 DIAGNOSIS — R600 Localized edema: Secondary | ICD-10-CM | POA: Diagnosis present

## 2024-07-08 NOTE — ED Triage Notes (Signed)
 Pt c/o facial swelling after having two root canals done yesterday. Was placed on abx.

## 2024-07-09 ENCOUNTER — Emergency Department (HOSPITAL_COMMUNITY)
Admission: EM | Admit: 2024-07-09 | Discharge: 2024-07-09 | Disposition: A | Discharge status: Home / Self Care | Source: Home / Self Care | Attending: Emergency Medicine | Admitting: Emergency Medicine

## 2024-07-09 ENCOUNTER — Other Ambulatory Visit: Payer: Self-pay

## 2024-07-09 ENCOUNTER — Encounter (HOSPITAL_COMMUNITY): Payer: Self-pay

## 2024-07-09 DIAGNOSIS — K047 Periapical abscess without sinus: Secondary | ICD-10-CM | POA: Insufficient documentation

## 2024-07-09 MED ORDER — CLINDAMYCIN PHOSPHATE 900 MG/6ML IJ SOLN
600.0000 mg | Freq: Once | INTRAMUSCULAR | Status: DC
  Filled 2024-07-09: qty 4

## 2024-07-09 MED ORDER — BUPIVACAINE-EPINEPHRINE (PF) 0.5% -1:200000 IJ SOLN
1.8000 mL | Freq: Once | INTRAMUSCULAR | Status: AC
  Administered 2024-07-09: 1.8 mL
  Filled 2024-07-09: qty 1.8

## 2024-07-09 MED ORDER — ACETAMINOPHEN 325 MG PO TABS
650.0000 mg | ORAL_TABLET | Freq: Once | ORAL | Status: DC
  Filled 2024-07-09: qty 2

## 2024-07-09 MED ORDER — DIPHENHYDRAMINE HCL 50 MG/ML IJ SOLN
25.0000 mg | Freq: Once | INTRAMUSCULAR | Status: AC
  Administered 2024-07-09: 25 mg via INTRAVENOUS

## 2024-07-09 MED ORDER — DIPHENHYDRAMINE HCL 25 MG PO CAPS
25.0000 mg | ORAL_CAPSULE | Freq: Once | ORAL | Status: DC
Start: 2024-07-09 — End: 2024-07-09

## 2024-07-09 MED ORDER — HYDROCODONE-ACETAMINOPHEN 5-325 MG PO TABS
1.0000 | ORAL_TABLET | Freq: Once | ORAL | Status: AC
  Administered 2024-07-09: 1 via ORAL
  Filled 2024-07-09: qty 1

## 2024-07-09 MED ORDER — DIPHENHYDRAMINE HCL 50 MG/ML IJ SOLN
25.0000 mg | Freq: Once | INTRAMUSCULAR | Status: DC
  Filled 2024-07-09: qty 1

## 2024-07-09 MED ORDER — CLINDAMYCIN PHOSPHATE 600 MG/50ML IV SOLN
600.0000 mg | Freq: Once | INTRAVENOUS | Status: AC
  Administered 2024-07-09: 600 mg via INTRAVENOUS
  Filled 2024-07-09: qty 50

## 2024-07-09 NOTE — ED Triage Notes (Addendum)
 Patient reports right lower dental pain with jaw swelling.  That area had a bridge and it was taken out.  Reports n/v and pain so bad she can't sleep.Reports has been on 2 different antibiotics with no relief.

## 2024-07-09 NOTE — ED Provider Notes (Signed)
 [place] EMERGENCY DEPARTMENT AT [facility] Provider Note   CSN: [id number] Arrival date & time: 07/09/24  1007     Patient presents with: Dental Pain   [name] is a 63 y.o. female.  He has history of appendiceal cancer status post right hemicolectomy and chemotherapy.  Presents ER for right lower dental pain and swelling.  She states she developed a lot of dental problems after undergoing chemotherapy.  She has had multiple root canals in the past, she had 1 on the right side several months ago but developed increased swelling and pain on the side over the past couple of days.  She has been on Levaquin  recently for a left sided dental infection which had resolved, when she called her dentist and told her that the other side was swollen they recommended she restart the clindamycin  that she had at home from a prior infection.  She just started this last night and has had only 1 dose she reports.  Comes in today because she still having pain, she been trying ibuprofen  at home without relief.  She denies fever or chills.  No trouble swallowing or breathing.  She is unfortunately allergic to, and states it causes severe stomach pain vomiting and a rash.  Patient messaged her dentist and got a text message back advising her to go to urgent care or the ER and get an injection of antibiotics to get ahead of the infection and continue the oral antibiotics and she will see her in follow-up.    Dental Pain      Prior to Admission medications   Medication Sig Start Date End Date Taking? Authorizing Provider  amphetamine -dextroamphetamine  (ADDERALL) 10 MG tablet Take 5-10 mg by mouth daily. For attention 10/31/11   [provider]  cetirizine (ZYRTEC) 10 MG tablet Take 10 mg by mouth every morning.     [provider]  Cholecalciferol (VITAMIN D3) 2000 units TABS Take 2,000 Units by mouth daily.    [provider]  dicyclomine  (BENTYL ) 20 MG tablet Take  1 tablet (20 mg total) by mouth 2 (two) times daily. 04/30/24   Daralene Lonni BIRCH, PA-C  estradiol  (ESTRACE ) 2 MG tablet Take 2 mg by mouth daily. 09/08/16   [provider]  fluticasone  (FLONASE ) 50 MCG/ACT nasal spray Place 2 sprays into both nostrils daily. 08/07/16   Iva Marty Saltness, MD  furosemide (LASIX) 40 MG tablet Take 40 mg by mouth daily.     [provider]  ibuprofen  (ADVIL ) 100 MG tablet Take 100 mg by mouth every 8 (eight) hours as needed for pain or fever.    [provider]  levofloxacin  (LEVAQUIN ) 500 MG tablet Take 500 mg by mouth daily. 03/29/24   [provider]  metroNIDAZOLE  (METROGEL ) 0.75 % vaginal gel Place 1 Applicatorful vaginally at bedtime. 04/30/24   [provider]  nitrofurantoin, macrocrystal-monohydrate, (MACROBID) 100 MG capsule Take 100 mg by mouth 2 (two) times daily. 04/30/24   [provider]  omeprazole  (PRILOSEC) 40 MG capsule Take 40 mg by mouth daily.    [provider]  Oxycodone  HCl 10 MG TABS Take 10 mg by mouth every 6 (six) hours.    [provider]  potassium chloride  SA (K-DUR,KLOR-CON ) 20 MEQ tablet Take 20 mEq by mouth daily.  08/19/13   [provider]  pramipexole  (MIRAPEX ) 1.5 MG tablet Take 1.5 mg by mouth daily. 10/04/22   [provider]  PROAIR  RESPICLICK 108 (90 Base)  MCG/ACT AEPB Inhale 1 puff into the lungs every 4 (four) hours as needed (wheezing, shortness of breath). 04/27/24   [provider]  sucralfate  (CARAFATE ) 1 g tablet Take 1 tablet (1 g total) by mouth 4 (four) times daily -  with meals and at bedtime. 04/30/24   Daralene Lonni BIRCH, PA-C    Allergies: Ativan  [lorazepam ], Prochlorperazine edisylate, Promethazine hcl, Trimethobenzamide hcl, Butorphanol, Clarithromycin, Ibuprofen , Pseudoephedrine, Reglan  [metoclopramide ], Stadol [butorphanol tartrate], Sulfa antibiotics, Toradol  [ketorolac  tromethamine ], Factive [gemifloxacin  mesylate], Other, and Tofranil-pm    Review of Systems  Updated Vital Signs BP 133/61 (BP Location: Right Arm)   Pulse 88   Temp 97.8 F (36.6 C) (Oral)   Resp 18   Ht 5' 2 (1.575 m)   Wt 77.1 kg   LMP 12/29/1993   SpO2 96%   BMI 31.09 kg/m   Physical Exam Vitals and nursing note reviewed.  Constitutional:      General: She is not in acute distress.    Appearance: She is well-developed.     Comments: Patient is tearful, appears uncomfortable  HENT:     Head: Normocephalic and atraumatic.     Mouth/Throat:     Mouth: Mucous membranes are moist.     Pharynx: No oropharyngeal exudate or posterior oropharyngeal erythema.     Comments: Moderate swelling right mandible, no sublingual, submental or submandibular swelling, there is no trismus.  Patient speaks in full and clear sentences, she handles oral secretions well.  Right lower molars are broken and very diseased unfortunately, there is diffuse tenderness in this area without fluctuance. Eyes:     Extraocular Movements: Extraocular movements intact.     Conjunctiva/sclera: Conjunctivae normal.     Pupils: Pupils are equal, round, and reactive to light.  Cardiovascular:     Rate and Rhythm: Normal rate and regular rhythm.     Heart sounds: No murmur heard. Pulmonary:     Effort: Pulmonary effort is normal. No respiratory distress.     Breath sounds: Normal breath sounds.  Abdominal:     Palpations: Abdomen is soft.     Tenderness: There is no abdominal tenderness.  Musculoskeletal:        General: No swelling.     Cervical back: Neck supple.  Skin:    General: Skin is warm and dry.     Capillary Refill: Capillary refill takes less than 2 seconds.  Neurological:     General: No focal deficit present.     Mental Status: She is alert and oriented to person, place, and time.  Psychiatric:        Mood and Affect: Mood normal.     (all labs ordered are listed, but only abnormal results are displayed) Labs Reviewed -  No data to display  EKG: None  Radiology: No results found.   Dental Block  Date/Time: 07/09/2024 2:20 PM  Performed by: Suellen Sherran LABOR, PA-C Authorized by: Suellen Sherran LABOR, PA-C   Consent:    Consent obtained:  Verbal   Consent given by:  Patient   Risks discussed:  Allergic reaction, infection, swelling, hematoma, intravascular injection, pain and unsuccessful block    Medications Ordered in the ED  acetaminophen  (TYLENOL ) tablet 650 mg (650 mg Oral Not Given 07/09/24 1514)  HYDROcodone -acetaminophen  (NORCO/VICODIN) 5-325 MG per tablet 1 tablet (1 tablet Oral Given 07/09/24 1151)  bupivacaine -epinephrine  (PF) (MARCAINE  W/ EPI) 0.5% -1:200000 injection 1.8 mL (1.8 mLs Infiltration Given 07/09/24 1155)  clindamycin  (CLEOCIN ) IVPB 600 mg (0 mg  Intravenous Stopped 07/09/24 1519)  diphenhydrAMINE  (BENADRYL ) injection 25 mg (25 mg Intravenous Given 07/09/24 1430)                                    Medical Decision Making Ddx: dental abscess, dental caries, dental fracture, alveolar osteitis, ludwigs angina, other ED course: Patient present with pain in the right lower jaw. They speak in full and clear sentences,  handle their oral secretions well. There is no sign of deep space abscess. There is no drainable collection.  Does have some localized swelling Pain management: Pt given a dental block for pain control as well as Norco.  She states she cannot have Toradol , she reviews Tylenol . Antibiotics provided for dental infection in the ED, she reports that she has clindamycin  at home prescribed by her dentist to continue taking.  She is already established with a dentist and is considering switching and seeing her husband's dentist as she has been unfortunately unhappy with her current dentist care.  Patient does have prescription for chronic oxycodone  I discussed with her I cannot provide her with further narcotics related to her dental pain.  Patient was requesting injection of  Benadryl , states she has a mild itching of her face and felt this would also potentially help her sleep as pain was preventing her from getting rest.  This was provided to help with the itching.   Risk OTC drugs. Prescription drug management.        Final diagnoses:  Dental abscess    ED Discharge Orders     None          Suellen Sherran LABOR, PA-C 07/09/24 1521    Elnor Jayson LABOR, DO 07/10/24 2113

## 2024-07-09 NOTE — Discharge Instructions (Addendum)
 You are seen today for a dental abscess.  At your dentist request we gave you IV antibiotics for 1 dose, continue taking your prescribed antibiotics at home and pain medicine at home.  Follow-up closely with a dentist on Monday.  Come back to the ER if you have fever, worsening swelling, trouble swallowing or breathing or any other worrisome changes.

## 2024-08-01 ENCOUNTER — Telehealth: Payer: Self-pay | Admitting: Internal Medicine

## 2024-08-01 NOTE — Telephone Encounter (Signed)
 Patient left voicemail to schedule appointment. Called patient back to schedule her no answer left voicemail.

## 2024-08-04 ENCOUNTER — Telehealth: Payer: Self-pay | Admitting: *Deleted

## 2024-08-04 NOTE — Telephone Encounter (Signed)
 Several attempts to reach patient. No response. Letter mailed.

## 2024-08-04 NOTE — Telephone Encounter (Signed)
 Calls in leaves a message in pain and went to ER and has had cancer and now has abd pain needs an appointment to be seen and we do not give pain meds

## 2024-08-05 NOTE — Telephone Encounter (Signed)
 Noted

## 2024-08-09 ENCOUNTER — Telehealth: Payer: Self-pay | Admitting: Gastroenterology

## 2024-08-09 ENCOUNTER — Ambulatory Visit: Admitting: Gastroenterology

## 2024-08-09 ENCOUNTER — Encounter: Payer: Self-pay | Admitting: Gastroenterology

## 2024-08-09 VITALS — BP 138/82 | HR 76 | Temp 97.6°F | Ht 62.0 in | Wt 174.6 lb

## 2024-08-09 DIAGNOSIS — R109 Unspecified abdominal pain: Secondary | ICD-10-CM | POA: Diagnosis not present

## 2024-08-09 DIAGNOSIS — R1319 Other dysphagia: Secondary | ICD-10-CM | POA: Diagnosis not present

## 2024-08-09 DIAGNOSIS — K219 Gastro-esophageal reflux disease without esophagitis: Secondary | ICD-10-CM

## 2024-08-09 DIAGNOSIS — K589 Irritable bowel syndrome without diarrhea: Secondary | ICD-10-CM

## 2024-08-09 MED ORDER — DICYCLOMINE HCL 10 MG PO CAPS: ORAL_CAPSULE | ORAL | 0 refills | Status: DC

## 2024-08-09 NOTE — Patient Instructions (Addendum)
 Restart bentyl  10mg  up to 3 times daily for abdominal pain. Use lowest effective dose. Hold if you have constipation.   Continue omeprazole  40mg  daily before breakfast.  We will be in touch to schedule upper endoscopy and possible colonoscopy once discussed with Dr. Shaaron.

## 2024-08-09 NOTE — Telephone Encounter (Signed)
 Discussed with Dr. Shaaron. Ok to proceed with colonoscopy/EGD/ED with Dr. Shaaron.  ASA 3.  Hold bentyl  3 days before. Use bisacodyl  10mg  daily for 3 days before onset of bowel prep. Dx: history of right hemicolectomy for carcinoid tumor, new right sided abdominal pain with negative CT, gerd, dysphagia, h/o colon polyps.

## 2024-08-09 NOTE — Progress Notes (Signed)
 GI Office Note    Referring Provider: Austin Mutton, MD Primary Care Physician:  Austin Mutton, MD  Primary Gastroenterologist: Ozell Hollingshead, MD   Chief Complaint   Chief Complaint  Patient presents with   Abdominal Pain    Right side pain. Everything see eats she has pains     History of Present Illness   [name] is a 63 y.o. female presenting today for further evaluation of right sided abdominal pain. She was last seen at time of virtual ov in 2023. She was scheduled for EGD/ED couple of times but never completed.  Patient reports 3 month history of right sided abdominal pain. Gradually worsening. Now occurring daily. Worse with meals. Some improvement with bentyl  or levsin  (she does not have active prescription). Some improvement with passing gas or having a BM. Took a laxative last week and her abdominal pain was much better. Some nausea when pain is bad. Sometimes she just cries in pain. She has chronic pain from arthritis and fibromyalgia. Takes oxycodone  regularly for this pain. Had been taking ibuprofen  200mg  TID for dental pain recently. No ASA powders. Her reflux is poorly controlled. Complains of solid food dysphagia. No melena, brbpr. Has BM most days. Sometimes has some intermittent constipation and diarrhea.   States this right sided abdominal pain is new since her last colonoscopy over 2 years ago.   Seen in the ED in 04/2024 for this pain. CT and labs as outlined.   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 S/p right hemicolectomy. Advised repeat in 5 years.    EGD 04/03/22: mild Schatzki ring s/p dilation. Benign gastric polyps. Normal duodenum   CT A/P with contrast 04/30/24: IMPRESSION: 1. No acute abnormality in the abdomen or pelvis. 2. Partial right hemicolectomy with patent anastomosis in the right hemiabdomen. 3. Aortic Atherosclerosis (ICD10-I70.0).    Medications    Current Outpatient Medications  Medication Sig Dispense Refill   amphetamine -dextroamphetamine  (ADDERALL) 10 MG tablet Take 5-10 mg by mouth daily. For attention     cetirizine (ZYRTEC) 10 MG tablet Take 10 mg by mouth every morning.      Cholecalciferol (VITAMIN D3) 2000 units TABS Take 2,000 Units by mouth daily.     estradiol  (ESTRACE ) 2 MG tablet Take 2 mg by mouth daily.     fluticasone  (FLONASE ) 50 MCG/ACT nasal spray Place 2 sprays into both nostrils daily. 16 g 5   furosemide (LASIX) 40 MG tablet Take 40 mg by mouth daily.      ibuprofen  (ADVIL ) 100 MG tablet Take 100 mg by mouth every 8 (eight) hours as needed for pain or fever.     nitrofurantoin, macrocrystal-monohydrate, (MACROBID) 100 MG capsule Take 100 mg by mouth 2 (two) times daily.     omeprazole  (PRILOSEC) 40 MG capsule Take 40 mg by mouth daily.     Oxycodone  HCl 10 MG TABS Take 10 mg by mouth every 6 (six) hours.     potassium chloride  SA (K-DUR,KLOR-CON ) 20 MEQ tablet Take 20 mEq by mouth daily.      pramipexole  (MIRAPEX ) 1.5 MG tablet Take 1.5 mg by mouth daily.     PROAIR  RESPICLICK 108 (90 Base) MCG/ACT AEPB Inhale 1 puff into the lungs every 4 (four) hours as needed (wheezing, shortness of breath).     sucralfate  (CARAFATE ) 1 g tablet Take 1 tablet (1 g total) by mouth 4 (four) times  daily -  with meals and at bedtime. 30 tablet 0   No current facility-administered medications for this visit.    Allergies   Allergies as of 08/09/2024 - Review Complete 08/09/2024  Allergen Reaction Noted   Ativan  [lorazepam ] Other (See Comments) 01/29/2012   Prochlorperazine edisylate Anaphylaxis, Anxiety, Dermatitis, and Other (See Comments) 11/23/2020   Promethazine hcl Anaphylaxis, Anxiety, and Dermatitis 11/23/2020   Trimethobenzamide hcl Anaphylaxis and Anxiety 11/23/2020   Butorphanol Nausea And Vomiting and Nausea Only 07/19/2012   Clarithromycin Itching, Nausea And Vomiting, and Nausea Only 10/15/2021   Ibuprofen   Other (See Comments) 01/08/2013   Pseudoephedrine Hives    Reglan  [metoclopramide ] Other (See Comments) 06/14/2013   Stadol [butorphanol tartrate] Nausea And Vomiting 07/19/2012   Sulfa antibiotics Nausea And Vomiting 08/28/2011   Toradol  [ketorolac  tromethamine ] Other (See Comments) 07/30/2012   Factive [gemifloxacin mesylate] Rash 08/28/2011   Other Rash 07/19/2012   Tofranil-pm Rash 01/02/2012     Past Medical History   Past Medical History:  Diagnosis Date   Alopecia, unspecified    Anxiety    Arthritis    Bronchitis    Cancer (HCC)    carcinoid tumor of appendix   Carcinoid tumor of appendix    Carcinoid, 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 Reoperation for her lymph node dissection revealed all nodes to be absolutely negative.    Cervicalgia    COPD (chronic obstructive pulmonary disease) (HCC)    Depression with anxiety    Dysphagia    Fibromyalgia    GERD (gastroesophageal reflux disease)    IBS (irritable bowel syndrome)    Interstitial cystitis    Kidney stones    Migraine    Mitral valve prolapse    PONV (postoperative nausea and vomiting)    PTSD (post-traumatic stress disorder)    Renal failure    RLS (restless legs syndrome)    Shortness of breath    Tobacco use disorder    UTI (lower urinary tract infection)     Past Surgical History   Past Surgical History:  Procedure Laterality Date   ABDOMINAL HYSTERECTOMY     ABDOMINAL SURGERY     APPENDECTOMY  01/05/2012   with incidental finding of carcinoid   BIOPSY  04/03/2022   Procedure: BIOPSY;  Surgeon: Shaaron Lamar HERO, MD;  Location: [facility];  Service: Endoscopy;;   BREAST BIOPSY Right 02/10/2023   Benign breast, predominantly mature adipose, with fibrocystic changes including stromal fibrosis and cystic dilatation of ducts/US  RT BREAST BX W LOC DEV 1ST LESION IMG BX  SPEC US  GUIDE 02/10/2023 [facility]   CARDIAC CATHETERIZATION     no stents needed   CARDIAC SURGERY     CHOLECYSTECTOMY     COLON RESECTION  01/30/2012   Procedure: HAND ASSISTED LAPAROSCOPIC COLON RESECTION;  Surgeon: Oneil DELENA Budge, MD;  Location: [facility];  Service: General;  Laterality: N/A;   COLON SURGERY     COLONOSCOPY WITH PROPOFOL  N/A 09/29/2013   MFM:Mzrujo and colonic polyps-removed as described above Status post right hemicolectomy   COLONOSCOPY WITH PROPOFOL  N/A 03/15/2015   Procedure: COLONOSCOPY WITH PROPOFOL ;  Surgeon: Lamar HERO Shaaron, MD; normal exam.  Repeat in 5 years.   COLONOSCOPY WITH PROPOFOL  N/A 04/03/2022   Procedure: COLONOSCOPY WITH PROPOFOL ;  Surgeon: Shaaron Lamar HERO, MD;  Location: [facility];  Service: Endoscopy;  Laterality: N/A;  10:00am   CYSTOSTOMY W/ BLADDER DILATION     x 52    ESOPHAGEAL DILATION  01/01/2012   RMR: Normal esophagus - status post dilation with 54 F Maloney dilation, gastric polyps benign path   ESOPHAGOGASTRODUODENOSCOPY (EGD) WITH PROPOFOL  N/A 09/29/2013   MFM:Wnwrmpuprjo appearing Schatzki's ring-status post dilation as described above. Hiatal hernia.   ESOPHAGOGASTRODUODENOSCOPY (EGD) WITH PROPOFOL  N/A 04/03/2022   Procedure: ESOPHAGOGASTRODUODENOSCOPY (EGD) WITH PROPOFOL ;  Surgeon: Shaaron Lamar HERO, MD;  Location: [facility];  Service: Endoscopy;  Laterality: N/A;   GIVENS CAPSULE STUDY  01/27/2012   normal   HAND SURGERY Bilateral    HEMICOLECTOMY     ileo-colonoscopy  01/01/2012   RMR: multiple colonic polyps, largest polyp in proximal sigmoid colon. Tubulovillous adenoma with high grade dysplasia, tubular adenoma   LAPAROSCOPIC APPENDECTOMY  01/05/2012   Procedure: APPENDECTOMY LAPAROSCOPIC;  Surgeon: Oneil DELENA Budge;  Location: [facility];  Service: General;  Laterality: N/A;   MALONEY DILATION N/A 09/29/2013   Procedure: AGAPITO DILATION;  Surgeon: Lamar HERO Shaaron, MD;  Location: [facility];  Service: Endoscopy;   Laterality: N/A;  55   MALONEY DILATION N/A 04/03/2022   Procedure: AGAPITO DILATION;  Surgeon: Shaaron Lamar HERO, MD;  Location: [facility];  Service: Endoscopy;  Laterality: N/A;   PARTIAL COLECTOMY  01/30/2012   Procedure: PARTIAL COLECTOMY;  Surgeon: Oneil DELENA Budge, MD;  Location: [facility];  Service: General;  Laterality: N/A;   POLYPECTOMY N/A 09/29/2013   Procedure: POLYPECTOMY;  Surgeon: Lamar HERO Shaaron, MD;  Location: [facility];  Service: Endoscopy;  Laterality: N/A;   POLYPECTOMY  04/03/2022   Procedure: POLYPECTOMY;  Surgeon: Shaaron Lamar HERO, MD;  Location: [facility];  Service: Endoscopy;;   TONSILLECTOMY      Past Family History   Family History  Problem Relation Age of Onset   Coronary artery disease Mother    Heart attack Mother    Pulmonary embolism Mother    Alcohol abuse Father    COPD Father    Lung disease Father    Asthma Father    Colon cancer Sister        43   Anesthesia problems Neg Hx    Hypotension Neg Hx    Malignant hyperthermia Neg Hx    Pseudochol deficiency Neg Hx    Other Neg Hx    Allergic rhinitis Neg Hx    Angioedema Neg Hx    Atopy Neg Hx    Eczema Neg Hx    Immunodeficiency Neg Hx    Urticaria Neg Hx     Past Social History   Social History   Socioeconomic History   Marital status: Widowed    Spouse name: Not on file   Number of children: 1   Years of education: Not on file   Highest education level: Not on file  Occupational History   Occupation: disabled    Employer: UNEMPLOYED  Tobacco Use   Smoking status: Former    Current packs/day: 0.10    Average packs/day: 0.1 packs/day for 7.0 years (0.7 ttl pk-yrs)    Types: E-cigarettes, Cigarettes   Smokeless tobacco: Current   Tobacco comments:    Vapes occasionally  Vaping Use   Vaping status: Some Days  Substance and Sexual Activity   Alcohol use: No   Drug use: No   Sexual activity: Yes    Birth control/protection: Surgical  Other Topics Concern   Not on file   Social History Narrative  Not on file   Social Drivers of Health   Financial Resource Strain: Not on file  Food Insecurity: Not on file  Transportation Needs: Not on file  Physical Activity: Not on file  Stress: Not on file  Social Connections: Not on file  Intimate Partner Violence: Not on file    Review of Systems   General: Negative for anorexia, weight loss, fever, chills, fatigue, weakness. ENT: Negative for hoarseness, difficulty swallowing , nasal congestion. CV: Negative for chest pain, angina, palpitations, dyspnea on exertion, peripheral edema.  Respiratory: Negative for dyspnea at rest, dyspnea on exertion, cough, sputum, wheezing.  GI: See history of present illness. GU:  Negative for dysuria, hematuria, urinary incontinence, urinary frequency, nocturnal urination.  Endo: Negative for unusual weight change.     Physical Exam   BP 138/82 (BP Location: Right Arm, Patient Position: Sitting, Cuff Size: Large)   Pulse 76   Temp 97.6 F (36.4 C) (Temporal)   Ht 5' 2 (1.575 m)   Wt 174 lb 9.6 oz (79.2 kg)   LMP 12/29/1993   BMI 31.93 kg/m    General: Well-nourished, well-developed in no acute distress.  Eyes: No icterus. Mouth: Oropharyngeal mucosa moist and pink   Lungs: Clear to auscultation bilaterally.  Heart: Regular rate and rhythm, no murmurs rubs or gallops.  Abdomen: Bowel sounds are normal, nontender, nondistended, no hepatosplenomegaly or masses,  no abdominal bruits or hernia , no rebound or guarding.  Rectal: not performed Extremities: No lower extremity edema. No clubbing or deformities. Neuro: Alert and oriented x 4   Skin: Warm and dry, no jaundice.   Psych: Alert and cooperative, normal mood and affect.  Labs   Lab Results  Component Value Date   NA 135 04/30/2024   CL 100 04/30/2024   K 3.7 04/30/2024   CO2 26 04/30/2024   BUN 16 04/30/2024   CREATININE 0.94 04/30/2024   GFRNONAA >60 04/30/2024   CALCIUM 9.0 04/30/2024   PHOS 2.7  10/19/2012   ALBUMIN 3.5 04/30/2024   GLUCOSE 114 (H) 04/30/2024   Lab Results  Component Value Date   WBC 8.5 04/30/2024   HGB 12.7 04/30/2024   HCT 37.6 04/30/2024   MCV 87.6 04/30/2024   PLT 311 04/30/2024   Lab Results  Component Value Date   ALT 14 04/30/2024   AST 14 (L) 04/30/2024   ALKPHOS 69 04/30/2024   BILITOT 0.3 04/30/2024   Lab Results  Component Value Date   LIPASE 27 04/30/2024    Imaging Studies   No results found.  Assessment/Plan:   GERD/dysphagia: poorly controlled -continue omeprazole  40mg  daily -EGD/ED with Dr. Shaaron. ASA 3.  I have discussed the risks, alternatives, benefits with regards to but not limited to the risk of reaction to medication, bleeding, infection, perforation and the patient is agreeable to proceed. Written consent to be obtained.  Right sided abdominal pain: history of right hemicolectomy for carcinoid tumor previously, new right sdied abd pain with unremarkable CT. H/o colon polyps. Given her prior history of carcinoid tumor and new onset right sided abd pain, discussed with Dr. Shaaron, proceed with colonoscopy -colonoscopy. ASA 3.  I have discussed the risks, alternatives, benefits with regards to but not limited to the risk of reaction to medication, bleeding, infection, perforation and the patient is agreeable to proceed. Written consent to be obtained. -bentyl  10mg  TID prn.             Sonny RAMAN. Ezzard, MHS, PA-C [facility]

## 2024-08-10 ENCOUNTER — Encounter: Payer: Self-pay | Admitting: *Deleted

## 2024-08-10 MED ORDER — NA SULFATE-K SULFATE-MG SULF 17.5-3.13-1.6 GM/177ML PO SOLN
1.0000 | Freq: Once | ORAL | 0 refills | Status: AC
Start: 2024-08-10 — End: 2024-08-10

## 2024-08-10 NOTE — Telephone Encounter (Signed)
 Pt called back. Scheduled fo r8/20. Aware will call back with pre-op appt. Instructions sent via mychart. Rx for prep sent to pharmacy. Aware when to stop bentyl  and needs to start bisacodyl .

## 2024-08-10 NOTE — Addendum Note (Signed)
 Addended by: JEANELL GRAEME RAMAN on: 08/10/2024 09:35 AM   Modules accepted: Orders

## 2024-08-10 NOTE — Telephone Encounter (Signed)
 LMOVM to call back

## 2024-08-11 NOTE — Patient Instructions (Signed)
 [name]  08/11/2024     @PREFPERIOPPHARMACY @   Your procedure is scheduled on 08/17/2024.   Report to [facility] at  0900  A.M.    Call this number if you have problems the morning of surgery:  [phone]  If you experience any cold or flu symptoms such as cough, fever, chills, shortness of breath, etc. between now and your scheduled surgery, please notify us  at the above number.   Remember:  Follow the diet and prep instructions given to you by the office.   You may drink clear liquids until 0700 am on 08/17/2024.    Clear liquids allowed are:                    Water , Juice (No red color; non-citric and without pulp; diabetics please choose diet or no sugar options), Carbonated beverages (diabetics please choose diet or no sugar options), Clear Tea (No creamer, milk, or cream, including half & half and powdered creamer), Black Coffee Only (No creamer, milk or cream, including half & half and powdered creamer), and Clear Sports drink (No red color; diabetics please choose diet or no sugar options)    Take these medicines the morning of surgery with A SIP OF WATER                 omeprazole , oxycodone  (if needed), pramipexole .    Do not wear jewelry, make-up or nail polish, including gel polish,  artificial nails, or any other type of covering on natural nails (fingers and  toes).  Do not wear lotions, powders, or perfumes, or deodorant.  Do not shave 48 hours prior to surgery.  Men may shave face and neck.  Do not bring valuables to the hospital.  [facility] is not responsible for any belongings or valuables.  Contacts, dentures or bridgework may not be worn into surgery.  Leave your suitcase in the car.  After surgery it may be brought to your room.  For patients admitted to the hospital, discharge time will be determined by your treatment team.  Patients discharged the day of surgery will not be allowed to drive home and must have someone with them for  24 hours.   Special instructions:   DO NOT smoke tobacco or vape for 24 hours before your procedure.  Please read over the following fact sheets that you were given. Anesthesia Post-op Instructions and Care and Recovery After Surgery      Upper Endoscopy, Adult, Care After After the procedure, it is common to have a sore throat. It is also common to have: Mild stomach pain or discomfort. Bloating. Nausea. Follow these instructions at home: The instructions below may help you care for yourself at home. Your health care provider may give you more instructions. If you have questions, ask your health care provider. If you were given a sedative during the procedure, it can affect you for several hours. Do not drive or operate machinery until your health care provider says that it is safe. If you will be going home right after the procedure, plan to have a responsible adult: Take you home from the hospital or clinic. You will not be allowed to drive. Care for you for the time you are told. Follow instructions from your health care provider about what you may eat and drink. Return to your normal activities as told by your health care provider. Ask your health care provider what activities are  safe for you. Take over-the-counter and prescription medicines only as told by your health care provider. Contact a health care provider if you: Have a sore throat that lasts longer than one day. Have trouble swallowing. Have a fever. Get help right away if you: Vomit blood or your vomit looks like coffee grounds. Have bloody, black, or tarry stools. Have a very bad sore throat or you cannot swallow. Have difficulty breathing or very bad pain in your chest or abdomen. These symptoms may be an emergency. Get help right away. Call 911. Do not wait to see if the symptoms will go away. Do not drive yourself to the hospital. Summary After the procedure, it is common to have a sore throat, mild stomach  discomfort, bloating, and nausea. If you were given a sedative during the procedure, it can affect you for several hours. Do not drive until your health care provider says that it is safe. Follow instructions from your health care provider about what you may eat and drink. Return to your normal activities as told by your health care provider. This information is not intended to replace advice given to you by your health care provider. Make sure you discuss any questions you have with your health care provider. Document Revised: 03/26/2022 Document Reviewed: 03/26/2022 Elsevier Patient Education  2024 [employer].Esophageal Dilatation Esophageal dilatation, or dilation, is done to stretch a blocked or narrowed part of your esophagus. The esophagus is the part of your body that moves food from your mouth to your stomach. You may need to have it stretched if: You have a lot of scar tissue and it makes it hard or painful to swallow. You have cancer of the esophagus. There's a problem with how food moves through your esophagus. In some cases, you may need to have this procedure done more than once. Tell a health care provider about: Any allergies you have. All medicines you're taking, including vitamins, herbs, eye drops, creams, and over-the-counter medicines. Any problems you or family members have had with anesthesia. Any bleeding problems you have. Any surgeries you've had. Any medical conditions you have. Whether you're pregnant or may be pregnant. What are the risks? Your health care provider will talk with you about risks. These may include: Bleeding. A hole or tear in your esophagus. What happens before the procedure? When to stop eating and drinking Follow instructions from your provider about what you may eat and drink. These may include: 8 hours before your procedure Stop eating most foods. Do not eat meat, fried foods, or fatty foods. Eat only light foods, such as toast or  crackers. All liquids are okay except energy drinks and alcohol. 6 hours before your procedure Stop eating. Drink only clear liquids, such as water , clear fruit juice, black coffee, plain tea, and sports drinks. Do not drink energy drinks or alcohol. 2 hours before your procedure Stop drinking all liquids. You may be allowed to take medicines with small sips of water . If you don't follow your provider's instructions, your procedure may be delayed or canceled. Medicines Ask your provider about: Changing or stopping your regular medicines. These include any diabetes medicines or blood thinners you take. Taking medicines such as aspirin and ibuprofen . These medicines can thin your blood. Do not take them unless your provider tells you to. Taking over-the-counter medicines, vitamins, herbs, and supplements. General instructions If you'll be going home right after the procedure, plan to have a responsible adult: Take you home from the hospital or clinic.  You won't be allowed to drive. Care for you for the time you're told. What happens during the procedure? You may be given: A sedative. This helps you relax. Anesthesia. This keeps you from feeling pain. It will numb certain areas of your body. The stretching may be done with: Simple dilators. These are tools put in your esophagus to stretch it. Guide wires. These wires are put in using a tube called an endoscope. A dilator is put over the wires to stretch your esophagus. Then the wires are taken out. A balloon. The balloon is on the end of a tube. It's inflated to stretch your esophagus. The procedure may vary among providers and hospitals. What can I expect after the procedure? Your blood pressure, heart rate, breathing rate, and blood oxygen  level will be monitored until you leave the hospital or clinic. Your throat may feel sore and numb. This will get better over time. You won't be allowed to eat or drink until your throat is no longer  numb. You may be able to go home when you can: Drink. Pee. Sit on the edge of the bed without nausea or dizziness. Follow these instructions at home: Activity If you were given a sedative during the procedure, it can affect you for several hours. Do not drive or operate machinery until your provider says it's safe. Return to your normal activities as told by your provider. Ask your provider what activities are safe for you. General instructions Take over-the-counter and prescription medicines only as told by your provider. Follow instructions from your provider about what you may eat and drink. Do not use any products that contain nicotine or tobacco. These products include cigarettes, chewing tobacco, and vaping devices, such as e-cigarettes. If you need help quitting, ask your provider. Keep all follow-up visits. Your provider will make sure the procedure worked. Where to find more information American Society for Gastrointestinal Endoscopy (ASGE): asge.org Contact a health care provider if: You have trouble swallowing. You have a fever. Your pain doesn't get better with medicine. Get help right away if: You have chest pain. You have trouble breathing. You vomit blood. Your poop is: Black. Tarry. Bloody. These symptoms may be an emergency. Get help right away. Call 911. Do not wait to see if the symptoms will go away. Do not drive yourself to the hospital. This information is not intended to replace advice given to you by your health care provider. Make sure you discuss any questions you have with your health care provider. Document Revised: 03/13/2023 Document Reviewed: 03/13/2023 Elsevier Patient Education  2024 Else[employer] Inc.Colonoscopy, Adult, Care After The following information offers guidance on how to care for yourself after your procedure. Your health care provider may also give you more specific instructions. If you have problems or questions, contact your health care  provider. What can I expect after the procedure? After the procedure, it is common to have: A small amount of blood in your stool for 24 hours after the procedure. Some gas. Mild cramping or bloating of your abdomen. Follow these instructions at home: Eating and drinking  Drink enough fluid to keep your urine pale yellow. Follow instructions from your health care provider about eating or drinking restrictions. Resume your normal diet as told by your health care provider. Avoid heavy or fried foods that are hard to digest. Activity Rest as told by your health care provider. Avoid sitting for a long time without moving. Get up to take short walks every 1-2 hours. This  is important to improve blood flow and breathing. Ask for help if you feel weak or unsteady. Return to your normal activities as told by your health care provider. Ask your health care provider what activities are safe for you. Managing cramping and bloating  Try walking around when you have cramps or feel bloated. If directed, apply heat to your abdomen as told by your health care provider. Use the heat source that your health care provider recommends, such as a moist heat pack or a heating pad. Place a towel between your skin and the heat source. Leave the heat on for 20-30 minutes. Remove the heat if your skin turns bright red. This is especially important if you are unable to feel pain, heat, or cold. You have a greater risk of getting burned. General instructions If you were given a sedative during the procedure, it can affect you for several hours. Do not drive or operate machinery until your health care provider says that it is safe. For the first 24 hours after the procedure: Do not sign important documents. Do not drink alcohol. Do your regular daily activities at a slower pace than normal. Eat soft foods that are easy to digest. Take over-the-counter and prescription medicines only as told by your health care  provider. Keep all follow-up visits. This is important. Contact a health care provider if: You have blood in your stool 2-3 days after the procedure. Get help right away if: You have more than a small spotting of blood in your stool. You have large blood clots in your stool. You have swelling of your abdomen. You have nausea or vomiting. You have a fever. You have increasing pain in your abdomen that is not relieved with medicine. These symptoms may be an emergency. Get help right away. Call 911. Do not wait to see if the symptoms will go away. Do not drive yourself to the hospital. Summary After the procedure, it is common to have a small amount of blood in your stool. You may also have mild cramping and bloating of your abdomen. If you were given a sedative during the procedure, it can affect you for several hours. Do not drive or operate machinery until your health care provider says that it is safe. Get help right away if you have a lot of blood in your stool, nausea or vomiting, a fever, or increased pain in your abdomen. This information is not intended to replace advice given to you by your health care provider. Make sure you discuss any questions you have with your health care provider. Document Revised: 01/27/2023 Document Reviewed: 08/07/2021 Elsevier Patient Education  2024 Elsevier Inc.General Anesthesia, Adult, Care After The following information offers guidance on how to care for yourself after your procedure. Your health care provider may also give you more specific instructions. If you have problems or questions, contact your health care provider. What can I expect after the procedure? After the procedure, it is common for people to: Have pain or discomfort at the IV site. Have nausea or vomiting. Have a sore throat or hoarseness. Have trouble concentrating. Feel cold or chills. Feel weak, sleepy, or tired (fatigue). Have soreness and body aches. These can affect parts  of the body that were not involved in surgery. Follow these instructions at home: For the time period you were told by your health care provider:  Rest. Do not participate in activities where you could fall or become injured. Do not drive or use machinery. Do not  drink alcohol. Do not take sleeping pills or medicines that cause drowsiness. Do not make important decisions or sign legal documents. Do not take care of children on your own. General instructions Drink enough fluid to keep your urine pale yellow. If you have sleep apnea, surgery and certain medicines can increase your risk for breathing problems. Follow instructions from your health care provider about wearing your sleep device: Anytime you are sleeping, including during daytime naps. While taking prescription pain medicines, sleeping medicines, or medicines that make you drowsy. Return to your normal activities as told by your health care provider. Ask your health care provider what activities are safe for you. Take over-the-counter and prescription medicines only as told by your health care provider. Do not use any products that contain nicotine or tobacco. These products include cigarettes, chewing tobacco, and vaping devices, such as e-cigarettes. These can delay incision healing after surgery. If you need help quitting, ask your health care provider. Contact a health care provider if: You have nausea or vomiting that does not get better with medicine. You vomit every time you eat or drink. You have pain that does not get better with medicine. You cannot urinate or have bloody urine. You develop a skin rash. You have a fever. Get help right away if: You have trouble breathing. You have chest pain. You vomit blood. These symptoms may be an emergency. Get help right away. Call 911. Do not wait to see if the symptoms will go away. Do not drive yourself to the hospital. Summary After the procedure, it is common to have a  sore throat, hoarseness, nausea, vomiting, or to feel weak, sleepy, or fatigue. For the time period you were told by your health care provider, do not drive or use machinery. Get help right away if you have difficulty breathing, have chest pain, or vomit blood. These symptoms may be an emergency. This information is not intended to replace advice given to you by your health care provider. Make sure you discuss any questions you have with your health care provider. Document Revised: 03/14/2022 Document Reviewed: 03/14/2022 Elsevier Patient Education  2024 [employer].

## 2024-08-15 ENCOUNTER — Encounter (HOSPITAL_COMMUNITY): Payer: Self-pay

## 2024-08-15 ENCOUNTER — Encounter (HOSPITAL_COMMUNITY)
Admission: RE | Admit: 2024-08-15 | Discharge: 2024-08-15 | Disposition: A | Discharge status: Home / Self Care | Source: Ambulatory Visit | Attending: Internal Medicine | Admitting: Internal Medicine

## 2024-08-15 DIAGNOSIS — Z01818 Encounter for other preprocedural examination: Secondary | ICD-10-CM | POA: Diagnosis present

## 2024-08-15 DIAGNOSIS — Z01810 Encounter for preprocedural cardiovascular examination: Secondary | ICD-10-CM | POA: Insufficient documentation

## 2024-08-15 DIAGNOSIS — F17200 Nicotine dependence, unspecified, uncomplicated: Secondary | ICD-10-CM | POA: Diagnosis not present

## 2024-08-15 HISTORY — DX: Pneumonia, unspecified organism: J18.9

## 2024-08-17 ENCOUNTER — Ambulatory Visit (HOSPITAL_COMMUNITY): Admitting: Anesthesiology

## 2024-08-17 ENCOUNTER — Other Ambulatory Visit: Payer: Self-pay

## 2024-08-17 ENCOUNTER — Ambulatory Visit (HOSPITAL_BASED_OUTPATIENT_CLINIC_OR_DEPARTMENT_OTHER): Admitting: Anesthesiology

## 2024-08-17 ENCOUNTER — Encounter (HOSPITAL_COMMUNITY)
Admission: RE | Disposition: A | Discharge status: Home / Self Care | Payer: Self-pay | Source: Home / Self Care | Attending: Internal Medicine

## 2024-08-17 ENCOUNTER — Encounter (HOSPITAL_COMMUNITY): Payer: Self-pay | Admitting: Internal Medicine

## 2024-08-17 ENCOUNTER — Ambulatory Visit (HOSPITAL_COMMUNITY)
Admission: RE | Admit: 2024-08-17 | Discharge: 2024-08-17 | Disposition: A | Discharge status: Home / Self Care | Attending: Internal Medicine | Admitting: Internal Medicine

## 2024-08-17 DIAGNOSIS — R0602 Shortness of breath: Secondary | ICD-10-CM | POA: Diagnosis not present

## 2024-08-17 DIAGNOSIS — K219 Gastro-esophageal reflux disease without esophagitis: Secondary | ICD-10-CM | POA: Diagnosis not present

## 2024-08-17 DIAGNOSIS — R1314 Dysphagia, pharyngoesophageal phase: Secondary | ICD-10-CM | POA: Insufficient documentation

## 2024-08-17 DIAGNOSIS — Z791 Long term (current) use of non-steroidal anti-inflammatories (NSAID): Secondary | ICD-10-CM | POA: Insufficient documentation

## 2024-08-17 DIAGNOSIS — F17290 Nicotine dependence, other tobacco product, uncomplicated: Secondary | ICD-10-CM | POA: Diagnosis not present

## 2024-08-17 DIAGNOSIS — I10 Essential (primary) hypertension: Secondary | ICD-10-CM

## 2024-08-17 DIAGNOSIS — Z87891 Personal history of nicotine dependence: Secondary | ICD-10-CM | POA: Diagnosis not present

## 2024-08-17 DIAGNOSIS — K31A19 Gastric intestinal metaplasia without dysplasia, unspecified site: Secondary | ICD-10-CM | POA: Diagnosis not present

## 2024-08-17 DIAGNOSIS — D125 Benign neoplasm of sigmoid colon: Secondary | ICD-10-CM | POA: Diagnosis not present

## 2024-08-17 DIAGNOSIS — R1011 Right upper quadrant pain: Secondary | ICD-10-CM | POA: Insufficient documentation

## 2024-08-17 DIAGNOSIS — K2950 Unspecified chronic gastritis without bleeding: Secondary | ICD-10-CM | POA: Insufficient documentation

## 2024-08-17 DIAGNOSIS — R519 Headache, unspecified: Secondary | ICD-10-CM | POA: Diagnosis not present

## 2024-08-17 DIAGNOSIS — F17210 Nicotine dependence, cigarettes, uncomplicated: Secondary | ICD-10-CM | POA: Diagnosis not present

## 2024-08-17 DIAGNOSIS — R1310 Dysphagia, unspecified: Secondary | ICD-10-CM

## 2024-08-17 DIAGNOSIS — Z9049 Acquired absence of other specified parts of digestive tract: Secondary | ICD-10-CM | POA: Insufficient documentation

## 2024-08-17 DIAGNOSIS — Z79899 Other long term (current) drug therapy: Secondary | ICD-10-CM | POA: Diagnosis not present

## 2024-08-17 DIAGNOSIS — F419 Anxiety disorder, unspecified: Secondary | ICD-10-CM | POA: Diagnosis not present

## 2024-08-17 DIAGNOSIS — F32A Depression, unspecified: Secondary | ICD-10-CM | POA: Insufficient documentation

## 2024-08-17 DIAGNOSIS — K635 Polyp of colon: Secondary | ICD-10-CM | POA: Insufficient documentation

## 2024-08-17 DIAGNOSIS — K259 Gastric ulcer, unspecified as acute or chronic, without hemorrhage or perforation: Secondary | ICD-10-CM | POA: Diagnosis not present

## 2024-08-17 DIAGNOSIS — J449 Chronic obstructive pulmonary disease, unspecified: Secondary | ICD-10-CM | POA: Insufficient documentation

## 2024-08-17 DIAGNOSIS — Z800 Family history of malignant neoplasm of digestive organs: Secondary | ICD-10-CM | POA: Insufficient documentation

## 2024-08-17 DIAGNOSIS — R1031 Right lower quadrant pain: Secondary | ICD-10-CM | POA: Insufficient documentation

## 2024-08-17 DIAGNOSIS — K3189 Other diseases of stomach and duodenum: Secondary | ICD-10-CM | POA: Diagnosis not present

## 2024-08-17 HISTORY — PX: COLONOSCOPY: SHX5424

## 2024-08-17 HISTORY — PX: ESOPHAGOGASTRODUODENOSCOPY: SHX5428

## 2024-08-17 HISTORY — PX: ESOPHAGEAL DILATION: SHX303

## 2024-08-17 SURGERY — COLONOSCOPY
Anesthesia: General

## 2024-08-17 MED ORDER — MIDAZOLAM HCL 2 MG/2ML IJ SOLN
INTRAMUSCULAR | Status: DC | PRN
  Administered 2024-08-17: 2 mg via INTRAVENOUS

## 2024-08-17 MED ORDER — LACTATED RINGERS IV SOLN: INTRAVENOUS | Status: DC

## 2024-08-17 MED ORDER — PROPOFOL 500 MG/50ML IV EMUL
INTRAVENOUS | Status: DC | PRN
  Administered 2024-08-17: 200 ug/kg/min via INTRAVENOUS

## 2024-08-17 MED ORDER — PHENYLEPHRINE 80 MCG/ML (10ML) SYRINGE FOR IV PUSH (FOR BLOOD PRESSURE SUPPORT)
PREFILLED_SYRINGE | INTRAVENOUS | Status: DC | PRN
  Administered 2024-08-17 (×2): 160 ug via INTRAVENOUS

## 2024-08-17 MED ORDER — PROPOFOL 10 MG/ML IV BOLUS
INTRAVENOUS | Status: DC | PRN
  Administered 2024-08-17: 50 mg via INTRAVENOUS
  Administered 2024-08-17: 100 mg via INTRAVENOUS

## 2024-08-17 MED ORDER — LIDOCAINE 2% (20 MG/ML) 5 ML SYRINGE
INTRAMUSCULAR | Status: DC | PRN
  Administered 2024-08-17: 100 mg via INTRAVENOUS

## 2024-08-17 MED ORDER — PANTOPRAZOLE SODIUM 40 MG PO TBEC: 40.0000 mg | DELAYED_RELEASE_TABLET | Freq: Two times a day (BID) | ORAL | 3 refills | Status: AC

## 2024-08-17 MED ORDER — MIDAZOLAM HCL 2 MG/2ML IJ SOLN
INTRAMUSCULAR | Status: AC
  Filled 2024-08-17: qty 2

## 2024-08-17 NOTE — Anesthesia Preprocedure Evaluation (Signed)
 Anesthesia Evaluation  Patient identified by MRN, date of birth, ID band Patient awake    Reviewed: Allergy & Precautions, H&P , NPO status , Patient's Chart, lab work & pertinent test results, reviewed documented beta blocker date and time   History of Anesthesia Complications (+) PONV and history of anesthetic complications  Airway Mallampati: II  TM Distance: >3 FB Neck ROM: full    Dental no notable dental hx.    Pulmonary neg pulmonary ROS, shortness of breath, asthma , pneumonia, COPD, former smoker   Pulmonary exam normal breath sounds clear to auscultation       Cardiovascular Exercise Tolerance: Good hypertension, negative cardio ROS  Rhythm:regular Rate:Normal     Neuro/Psych  Headaches PSYCHIATRIC DISORDERS Anxiety Depression     Neuromuscular disease negative neurological ROS  negative psych ROS   GI/Hepatic negative GI ROS, Neg liver ROS,GERD  ,,  Endo/Other  negative endocrine ROS    Renal/GU Renal diseasenegative Renal ROS  negative genitourinary   Musculoskeletal   Abdominal   Peds  Hematology negative hematology ROS (+)   Anesthesia Other Findings   Reproductive/Obstetrics negative OB ROS                              Anesthesia Physical Anesthesia Plan  ASA: 3  Anesthesia Plan: General   Post-op Pain Management:    Induction:   PONV Risk Score and Plan: Propofol  infusion  Airway Management Planned:   Additional Equipment:   Intra-op Plan:   Post-operative Plan:   Informed Consent: I have reviewed the patients History and Physical, chart, labs and discussed the procedure including the risks, benefits and alternatives for the proposed anesthesia with the patient or authorized representative who has indicated his/her understanding and acceptance.     Dental Advisory Given  Plan Discussed with: CRNA  Anesthesia Plan Comments:         Anesthesia  Quick Evaluation

## 2024-08-17 NOTE — Op Note (Signed)
 [facility] Patient Name: [name] Procedure Date: 08/17/2024 10:12 AM MRN: [id number] Date of Birth: 04/05/1961 Attending MD: [name] , MD, [id number] CSN: [id number] Age: 63 Admit Type: Outpatient Procedure:                Colonoscopy Indications:              Abdominal pain in the right lower quadrant,                            Abdominal pain in the right upper quadrant Providers:                [name], MD, Leandrew Edelman RN, RN,                            Jon Loge Referring MD:              Medicines:                Propofol  per Anesthesia Complications:            No immediate complications. Estimated Blood Loss:     Estimated blood loss was minimal. Procedure:                Pre-Anesthesia Assessment:                           - Prior to the procedure, a History and Physical                            was performed, and patient medications and                            allergies were reviewed. The patient's tolerance of                            previous anesthesia was also reviewed. The risks                            and benefits of the procedure and the sedation                            options and risks were discussed with the patient.                            All questions were answered, and informed consent                            was obtained. Prior Anticoagulants: The patient has                            taken no anticoagulant or antiplatelet agents. ASA                            Grade Assessment: III - A patient with severe  systemic disease. After reviewing the risks and                            benefits, the patient was deemed in satisfactory                            condition to undergo the procedure.                           After obtaining informed consent, the colonoscope                            was passed under direct vision. Throughout the                             procedure, the patient's blood pressure, pulse, and                            oxygen  saturations were monitored continuously. The                            [id number]-[id number] ([id number]) Colon was introduced through                            the anus and advanced to the 10 cm into the ileum.                            The colonoscopy was performed without difficulty.                            The patient tolerated the procedure well. The                            quality of the bowel preparation was adequate. Scope In: 10:52:34 AM Scope Out: 11:02:51 AM Scope Withdrawal Time: 0 hours 6 minutes 40 seconds  Total Procedure Duration: 0 hours 10 minutes 17 seconds  Findings:      The perianal and digital rectal examinations were normal.      A 4 mm polyp was found in the sigmoid colon. The polyp was sessile. The       polyp was removed with a cold snare. Resection and retrieval were       complete. Estimated blood loss was minimal. Status post prior Hemi       colectomy with blunt proximal end of the colon identified neoterminal       ileum intubated to 10 cm. The segment of the GI tract appeared normal.       No other abnormalities observed. Rectal mucosa seen well on?"face. Rectal       vault too small to retroflex. Impression:               - One 4 mm polyp in the sigmoid colon, removed with                            a cold snare. Resected and retrieved. Status post  right hemicolectomy normal-appearing anastomosis                            and neoterminal ileum                           I suspect patient's right-sided abdominal pain is                            due to peptic ulcer disease (see EGD report) Moderate Sedation:      Moderate (conscious) sedation was personally administered by an       anesthesia professional. The following parameters were monitored: oxygen        saturation, heart rate, blood pressure, respiratory rate, EKG, adequacy       of  pulmonary ventilation, and response to care. Recommendation:           - Patient has a contact number available for                            emergencies. The signs and symptoms of potential                            delayed complications were discussed with the                            patient. Return to normal activities tomorrow.                            Written discharge instructions were provided to the                            patient.                           - Advance diet as tolerated. Follow-up on                            pathology. Further recommendations to follow. Procedure Code(s):        --- Professional ---                           [phone], Colonoscopy, flexible; with removal of                            tumor(s), polyp(s), or other lesion(s) by snare                            technique Diagnosis Code(s):        --- Professional ---                           D12.5, Benign neoplasm of sigmoid colon                           R10.31, Right lower quadrant pain  R10.11, Right upper quadrant pain CPT copyright 2022 American Medical Association. All rights reserved. The codes documented in this report are preliminary and upon coder review may  be revised to meet current compliance requirements. Lamar HERO. Roshanna Cimino, MD [name], MD 08/17/2024 11:18:03 AM This report has been signed electronically. Number of Addenda: 0

## 2024-08-17 NOTE — Transfer of Care (Signed)
 Immediate Anesthesia Transfer of Care Note  Patient: [name]  Procedure(s) Performed: COLONOSCOPY EGD (ESOPHAGOGASTRODUODENOSCOPY) DILATION, ESOPHAGUS  Patient Location: Short Stay  Anesthesia Type:General  Level of Consciousness: awake, alert , oriented, and patient cooperative  Airway & Oxygen  Therapy: Patient Spontanous Breathing  Post-op Assessment: Report given to RN, Post -op Vital signs reviewed and stable, and Patient moving all extremities X 4  Post vital signs: Reviewed and stable  Last Vitals:  Vitals Value Taken Time  BP 127/58 08/17/24 11:08  Temp 36.7 C 08/17/24 11:08  Pulse 67 08/17/24 11:08  Resp 19 08/17/24 11:08  SpO2 98 % 08/17/24 11:08    Last Pain:  Vitals:   08/17/24 1108  TempSrc: Axillary  PainSc: 0-No pain         Complications: No notable events documented.

## 2024-08-17 NOTE — Discharge Instructions (Signed)
 EGD Discharge instructions Please read the instructions outlined below and refer to this sheet in the next few weeks. These discharge instructions provide you with general information on caring for yourself after you leave the hospital. Your doctor may also give you specific instructions. While your treatment has been planned according to the most current medical practices available, unavoidable complications occasionally occur. If you have any problems or questions after discharge, please call your doctor. ACTIVITY You may resume your regular activity but move at a slower pace for the next 24 hours.  Take frequent rest periods for the next 24 hours.  Walking will help expel (get rid of) the air and reduce the bloated feeling in your abdomen.  No driving for 24 hours (because of the anesthesia (medicine) used during the test).  You may shower.  Do not sign any important legal documents or operate any machinery for 24 hours (because of the anesthesia used during the test).  NUTRITION Drink plenty of fluids.  You may resume your normal diet.  Begin with a light meal and progress to your normal diet.  Avoid alcoholic beverages for 24 hours or as instructed by your caregiver.  MEDICATIONS You may resume your normal medications unless your caregiver tells you otherwise.  WHAT YOU CAN EXPECT TODAY You may experience abdominal discomfort such as a feeling of fullness or "gas" pains.  FOLLOW-UP Your doctor will discuss the results of your test with you.  SEEK IMMEDIATE MEDICAL ATTENTION IF ANY OF THE FOLLOWING OCCUR: Excessive nausea (feeling sick to your stomach) and/or vomiting.  Severe abdominal pain and distention (swelling).  Trouble swallowing.  Temperature over 101 F (37.8 C).  Rectal bleeding or vomiting of blood.      Colonoscopy Discharge Instructions  Read the instructions outlined below and refer to this sheet in the next few weeks. These discharge instructions provide you with  general information on caring for yourself after you leave the hospital. Your doctor may also give you specific instructions. While your treatment has been planned according to the most current medical practices available, unavoidable complications occasionally occur. If you have any problems or questions after discharge, call Dr. Shaaron at [phone]. ACTIVITY You may resume your regular activity, but move at a slower pace for the next 24 hours.  Take frequent rest periods for the next 24 hours.  Walking will help get rid of the air and reduce the bloated feeling in your belly (abdomen).  No driving for 24 hours (because of the medicine (anesthesia) used during the test).   Do not sign any important legal documents or operate any machinery for 24 hours (because of the anesthesia used during the test).  NUTRITION Drink plenty of fluids.  You may resume your normal diet as instructed by your doctor.  Begin with a light meal and progress to your normal diet. Heavy or fried foods are harder to digest and may make you feel sick to your stomach (nauseated).  Avoid alcoholic beverages for 24 hours or as instructed.  MEDICATIONS You may resume your normal medications unless your doctor tells you otherwise.  WHAT YOU CAN EXPECT TODAY Some feelings of bloating in the abdomen.  Passage of more gas than usual.  Spotting of blood in your stool or on the toilet paper.  IF YOU HAD POLYPS REMOVED DURING THE COLONOSCOPY: No aspirin products for 7 days or as instructed.  No alcohol for 7 days or as instructed.  Eat a soft diet for the next 24 hours.  FINDING OUT THE RESULTS OF YOUR TEST Not all test results are available during your visit. If your test results are not back during the visit, make an appointment with your caregiver to find out the results. Do not assume everything is normal if you have not heard from your caregiver or the medical facility. It is important for you to follow up on all of your test  results.  SEEK IMMEDIATE MEDICAL ATTENTION IF: You have more than a spotting of blood in your stool.  Your belly is swollen (abdominal distention).  You are nauseated or vomiting.  You have a temperature over 101.  You have abdominal pain or discomfort that is severe or gets worse throughout the day.       Your esophagus was stretched today.  You have a stomach ulcer which likely explains your right sided abdominal pain.  Moreover, this ulcer has been caused most likely by ibuprofen /Advil .   biopsies taken.   stop omeprazole .  Stop all NSAIDs.  See your primary care provider about nonaspirin pain reliever    Begin pantoprazole  40 mg orally twice daily before breakfast and supper.  New prescription has been called in to your pharmacy from the office   your colon looked good except for 1 small polyp removed  Further recommendations to follow pending review of pathology report   you will need a repeat EGD in 12 weeks to verify ulcer healing   office visit with us  in about 12 weeks   at patient request, call Glynn Freas at [phone] reviewed findings and recommendations

## 2024-08-17 NOTE — Op Note (Signed)
 [facility] Patient Name: [name] Procedure Date: 08/17/2024 10:13 AM MRN: [id number] Date of Birth: 1961-05-02 Attending MD: [name] , MD, [id number] CSN: [id number] Age: 63 Admit Type: Outpatient Procedure:                Upper GI endoscopy Indications:              Abdominal pain in the right upper quadrant,                            Abdominal pain in the right lower quadrant;                            esophageal dysphagia Providers:                [name], MD, Leandrew Edelman RN, RN,                            Jon Loge Referring MD:              Medicines:                Propofol  per Anesthesia Complications:            No immediate complications. Estimated Blood Loss:     Estimated blood loss was minimal. Procedure:                Pre-Anesthesia Assessment:                           - Prior to the procedure, a History and Physical                            was performed, and patient medications and                            allergies were reviewed. The patient's tolerance of                            previous anesthesia was also reviewed. The risks                            and benefits of the procedure and the sedation                            options and risks were discussed with the patient.                            All questions were answered, and informed consent                            was obtained. Prior Anticoagulants: The patient has                            taken no anticoagulant or antiplatelet agents. ASA  Grade Assessment: III - A patient with severe                            systemic disease. After reviewing the risks and                            benefits, the patient was deemed in satisfactory                            condition to undergo the procedure.                           After obtaining informed consent, the endoscope was                            passed under direct  vision. Throughout the                            procedure, the patient's blood pressure, pulse, and                            oxygen  saturations were monitored continuously. The                            [id number] ([id number]) Upper was introduced through                            the mouth, and advanced to the second part of                            duodenum. The upper GI endoscopy was accomplished                            without difficulty. The patient tolerated the                            procedure well. Scope In: 10:38:04 AM Scope Out: 10:46:11 AM Total Procedure Duration: 0 hours 8 minutes 7 seconds  Findings:      The examined esophagus was normal. Patchy erythema and erosions in the       gastric body and antrum. Patient had (1) 6 mm cratered ulcer in the       pyloric channel. Clean-based. Please see photos.      The duodenal bulb and second portion of the duodenum were normal. The       scope was withdrawn. Dilation was performed with a Maloney dilator with       mild resistance at 54 Fr. The dilation site was examined following       endoscope reinsertion and showed no change. Estimated blood loss: none.       Finally, biopsies of the abnormal gastric mucosa and gastric ulcer were       taken separately. Impression:               - Normal esophagus. Dilated. Cratered pyloric  channel ulcer with a clean base - status post                            gastric mucosa and ulcer biopsy -likely goes a long                            way to explain her right-sided abdominal pain                           - Normal duodenal bulb and second portion of the                            duodenum. Moderate Sedation:      Moderate (conscious) sedation was personally administered by an       anesthesia professional. The following parameters were monitored: oxygen        saturation, heart rate, blood pressure, respiratory rate, EKG, adequacy       of pulmonary  ventilation, and response to care. Recommendation:           - Patient has a contact number available for                            emergencies. The signs and symptoms of potential                            delayed complications were discussed with the                            patient. Return to normal activities tomorrow.                            Written discharge instructions were provided to the                            patient.                           - Advance diet as tolerated. Stop NSAIDs. Stop                            omeprazole  begin pantoprazole  40 mg orally twice                            daily taken before breakfast and supper. Follow-up                            on pathology                           See colonoscopy report. Procedure Code(s):        --- Professional ---                           [phone], Esophagogastroduodenoscopy, flexible,  transoral; diagnostic, including collection of                            specimen(s) by brushing or washing, when performed                            (separate procedure)                           43450, Dilation of esophagus, by unguided sound or                            bougie, single or multiple passes Diagnosis Code(s):        --- Professional ---                           R10.11, Right upper quadrant pain                           R10.31, Right lower quadrant pain CPT copyright 2022 American Medical Association. All rights reserved. The codes documented in this report are preliminary and upon coder review may  be revised to meet current compliance requirements. Lamar HERO. Cola Highfill, MD [name], MD 08/17/2024 11:07:24 AM This report has been signed electronically. Number of Addenda: 0

## 2024-08-17 NOTE — H&P (Signed)
 @LOGO @   Primary Care Physician:  Austin Mutton, MD Primary Gastroenterologist:  Dr. Shaaron  Pre-Procedure History & Physical: HPI:  [name] is a 63 y.o. female here for  for further evaluation of incessant right sided abdominal pain and dysphagia.  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  Longstanding esophageal dysphagia   With esophageal ring dilated multiple times in the past..  Here for both an EGD and colonoscopy.  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 Reoperation for her lymph node dissection revealed all nodes to be absolutely negative.    Cervicalgia    COPD (chronic obstructive pulmonary disease) (HCC)    Depression with anxiety    Dysphagia    Fibromyalgia    GERD (gastroesophageal reflux disease)    IBS (irritable bowel syndrome)    Interstitial cystitis    Kidney stones    Migraine    Mitral valve prolapse    Pneumonia    1990's   PONV (postoperative nausea and vomiting)    PTSD (post-traumatic stress disorder)    Renal failure    RLS (restless legs syndrome)    Shortness of breath    Tobacco use disorder    UTI (lower urinary tract infection)     Past Surgical History:  Procedure Laterality Date   ABDOMINAL HYSTERECTOMY     ABDOMINAL SURGERY     APPENDECTOMY  01/05/2012   with incidental finding of carcinoid   BIOPSY  04/03/2022   Procedure: BIOPSY;  Surgeon: Shaaron Lamar HERO, MD;  Location: [facility];  Service: Endoscopy;;   BREAST BIOPSY Right 02/10/2023   Benign breast, predominantly mature adipose, with fibrocystic changes including stromal fibrosis and cystic dilatation  of ducts/US  RT BREAST BX W LOC DEV 1ST LESION IMG BX SPEC US  GUIDE 02/10/2023 [facility]   CARDIAC CATHETERIZATION     no stents needed   CARDIAC SURGERY     CHOLECYSTECTOMY     COLON RESECTION  01/30/2012   Procedure: HAND ASSISTED LAPAROSCOPIC COLON RESECTION;  Surgeon: Oneil DELENA Budge, MD;  Location: [facility];  Service: General;  Laterality: N/A;   COLON SURGERY     COLONOSCOPY WITH PROPOFOL  N/A 09/29/2013   MFM:Mzrujo and colonic polyps-removed as described above Status post right hemicolectomy   COLONOSCOPY WITH PROPOFOL  N/A 03/15/2015   Procedure: COLONOSCOPY WITH PROPOFOL ;  Surgeon: Lamar HERO Shaaron, MD; normal exam.  Repeat in 5 years.   COLONOSCOPY WITH PROPOFOL  N/A 04/03/2022   Procedure: COLONOSCOPY WITH PROPOFOL ;  Surgeon: Shaaron Lamar HERO, MD;  Location: [facility];  Service: Endoscopy;  Laterality: N/A;  10:00am   CYSTOSTOMY W/ BLADDER DILATION     x 52    ESOPHAGEAL DILATION  01/01/2012   RMR: Normal esophagus - status post dilation with 54 F Maloney dilation, gastric polyps benign path   ESOPHAGOGASTRODUODENOSCOPY (EGD) WITH PROPOFOL  N/A 09/29/2013   MFM:Wnwrmpuprjo appearing Schatzki's ring-status post dilation as described above. Hiatal hernia.   ESOPHAGOGASTRODUODENOSCOPY (EGD) WITH PROPOFOL  N/A 04/03/2022   Procedure: ESOPHAGOGASTRODUODENOSCOPY (EGD) WITH PROPOFOL ;  Surgeon: Shaaron Lamar HERO, MD;  [facility];  Service: Endoscopy;  Laterality: N/A;   GIVENS CAPSULE STUDY  01/27/2012   normal   HAND SURGERY Bilateral    HEMICOLECTOMY     ileo-colonoscopy  01/01/2012   RMR: multiple colonic polyps, largest polyp in proximal sigmoid colon. Tubulovillous adenoma with high grade dysplasia, tubular adenoma   LAPAROSCOPIC APPENDECTOMY  01/05/2012   Procedure: APPENDECTOMY LAPAROSCOPIC;  Surgeon: Oneil DELENA Budge;  Location: [facility];  Service: General;  Laterality: N/A;   MALONEY DILATION N/A 09/29/2013   Procedure: AGAPITO DILATION;  Surgeon: Lamar CHRISTELLA Hollingshead, MD;  Location: [facility];  Service: Endoscopy;  Laterality: N/A;  55   MALONEY DILATION N/A 04/03/2022   Procedure: AGAPITO DILATION;  Surgeon: Hollingshead Lamar CHRISTELLA, MD;  Location: [facility];  Service: Endoscopy;  Laterality: N/A;   NECK SURGERY  2014   PARTIAL COLECTOMY  01/30/2012   Procedure: PARTIAL COLECTOMY;  Surgeon: Oneil DELENA Budge, MD;  Location: [facility];  Service: General;  Laterality: N/A;   POLYPECTOMY N/A 09/29/2013   Procedure: POLYPECTOMY;  Surgeon: Lamar CHRISTELLA Hollingshead, MD;  Location: [facility];  Service: Endoscopy;  Laterality: N/A;   POLYPECTOMY  04/03/2022   Procedure: POLYPECTOMY;  Surgeon: Hollingshead Lamar CHRISTELLA, MD;  Location[facility];  Service: Endoscopy;;   TONSILLECTOMY      Prior to Admission medications   Medication Sig Start Date End Date Taking? Authorizing Provider  amphetamine -dextroamphetamine  (ADDERALL) 10 MG tablet Take 5-10 mg by mouth daily. For attention 10/31/11  Yes [provider]  cetirizine (ZYRTEC) 10 MG tablet Take 10 mg by mouth every morning.    Yes [provider]  Cholecalciferol (VITAMIN D3) 2000 units TABS Take 2,000 Units by mouth daily.   Yes [provider]  clindamycin  (CLEOCIN ) 300 MG capsule Take 300 mg by mouth 3 (three) times daily.   Yes [provider]  dicyclomine  (BENTYL ) 10 MG capsule Take 1 capsule up to three times daily for abdominal pain. Use lowest effective dose/ 08/09/24  Yes Ezzard Sonny RAMAN, PA-C  estradiol  (ESTRACE ) 2 MG tablet Take 2 mg by mouth daily. 09/08/16  Yes [provider]  furosemide (LASIX) 40 MG tablet Take 40 mg by mouth daily.    Yes [provider]  ibuprofen  (ADVIL ) 100 MG tablet Take 100 mg by mouth every 8 (eight) hours as needed for pain or fever.   Yes [provider]  omeprazole  (PRILOSEC) 40 MG capsule Take 40 mg by mouth daily.   Yes [provider]  Oxycodone  HCl 10 MG TABS Take 10 mg by mouth every 6 (six) hours.   Yes [provider]  potassium chloride  SA (K-DUR,KLOR-CON ) 20 MEQ tablet Take 20 mEq by mouth daily.  08/19/13  Yes [provider]  pramipexole  (MIRAPEX ) 1.5 MG tablet Take 1.5 mg by mouth daily. 10/04/22  Yes [provider]  fluticasone  (FLONASE ) 50 MCG/ACT nasal spray Place 2 sprays into both nostrils daily. 08/07/16   Iva Marty Saltness, MD  nitrofurantoin, macrocrystal-monohydrate, (MACROBID) 100 MG capsule Take 100 mg by mouth 2 (two) times daily. 04/30/24   [provider]  PROAIR  RESPICLICK 108 (90 Base) MCG/ACT AEPB Inhale 1 puff into the lungs every 4 (four) hours as needed (wheezing, shortness of breath). 04/27/24   [provider]  sucralfate  (CARAFATE ) 1 g tablet Take 1 tablet (1 g total) by mouth 4 (four) times daily -  with meals and at bedtime. 04/30/24   Daralene Lonni BIRCH, PA-C    Allergies as  of 08/10/2024 - Review Complete 08/09/2024  Allergen Reaction Noted   Ativan  [lorazepam ] Other (See Comments) 01/29/2012   Prochlorperazine edisylate Anaphylaxis, Anxiety, Dermatitis, and Other (See Comments) 11/23/2020   Promethazine hcl Anaphylaxis, Anxiety, and Dermatitis 11/23/2020   Trimethobenzamide hcl Anaphylaxis and Anxiety 11/23/2020   Butorphanol Nausea And Vomiting and Nausea Only 07/19/2012   Clarithromycin Itching, Nausea And Vomiting, and Nausea Only 10/15/2021   Ibuprofen  Other (See Comments) 01/08/2013   Pseudoephedrine Hives    Reglan  [metoclopramide ] Other (See Comments) 06/14/2013   Stadol [butorphanol tartrate] Nausea And Vomiting 07/19/2012   Sulfa antibiotics Nausea And Vomiting 08/28/2011   Toradol  [ketorolac  tromethamine ] Other (See Comments) 07/30/2012   Factive [gemifloxacin mesylate] Rash 08/28/2011   Other Rash 07/19/2012   Tofranil-pm Rash 01/02/2012    Family History  Problem Relation Age of Onset   Coronary artery disease Mother    Heart attack Mother    Pulmonary embolism Mother    Alcohol abuse Father    COPD  Father    Lung disease Father    Asthma Father    Colon cancer Sister        63   Cancer Sister        oral cancer   Anesthesia problems Neg Hx    Hypotension Neg Hx    Malignant hyperthermia Neg Hx    Pseudochol deficiency Neg Hx    Other Neg Hx    Allergic rhinitis Neg Hx    Angioedema Neg Hx    Atopy Neg Hx    Eczema Neg Hx    Immunodeficiency Neg Hx    Urticaria Neg Hx     Social History   Socioeconomic History   Marital status: Married    Spouse name: Not on file   Number of children: 1   Years of education: Not on file   Highest education level: Not on file  Occupational History   Occupation: disabled    Employer: UNEMPLOYED  Tobacco Use   Smoking status: Former    Current packs/day: 0.10    Average packs/day: 0.1 packs/day for 7.0 years (0.7 ttl pk-yrs)    Types: E-cigarettes, Cigarettes   Smokeless tobacco: Current   Tobacco comments:    Vapes occasionally  Vaping Use   Vaping status: Some Days  Substance and Sexual Activity   Alcohol use: No   Drug use: No   Sexual activity: Yes    Birth control/protection: Surgical  Other Topics Concern   Not on file  Social History Narrative   Not on file   Social Drivers of Health   Financial Resource Strain: Not on file  Food Insecurity: Not on file  Transportation Needs: Not on file  Physical Activity: Not on file  Stress: Not on file  Social Connections: Not on file  Intimate Partner Violence: Not on file    Review of Systems: See HPI, otherwise negative ROS  Physical Exam: BP 129/82   Pulse 80   Temp 98.1 F (36.7 C) (Oral)   Resp 16   LMP 12/29/1993   SpO2 98%  General:   Alert,  Well-developed, well-nourished, pleasant and cooperative in NAD Neck:  Supple; no masses or thyromegaly. No significant cervical adenopathy. Lungs:  Clear throughout to auscultation.   No wheezes, crackles, or rhonchi. No acute distress. Heart:  Regular rate and rhythm; no murmurs, clicks, rubs,  or  gallops. Abdomen: Non-distended, normal bowel sounds.  Soft and nontender without appreciable mass or hepatosplenomegaly.   Impression/Plan:  64 year old lady with longstanding esophageal dysphagia history of recurrent Schatzki's ring dilated previously.  Also incessant right sided abdominal pain.  History of appendiceal carcinoid resected previously felt to been cured with resection.  CT recently did not determine a cause of her pain.  She does take a clear basis.  I have offered the patient both an EGD with esophageal dilation is feasible/appropriate as well as a colonoscopy today.  The risks, benefits, limitations, imponderables and alternatives regarding both EGD and colonoscopy have been reviewed with the patient. Questions have been answered. All parties agreeable.       Notice: This dictation was prepared with Dragon dictation along with smaller phrase technology. Any transcriptional errors that result from this process are unintentional and may not be corrected upon review.

## 2024-08-18 ENCOUNTER — Encounter (HOSPITAL_COMMUNITY): Payer: Self-pay | Admitting: Internal Medicine

## 2024-08-19 LAB — SURGICAL PATHOLOGY

## 2024-08-22 NOTE — Anesthesia Postprocedure Evaluation (Signed)
 Anesthesia Post Note  Patient: [name]  Procedure(s) Performed: COLONOSCOPY EGD (ESOPHAGOGASTRODUODENOSCOPY) DILATION, ESOPHAGUS  Patient location during evaluation: Phase II Anesthesia Type: General Level of consciousness: awake Pain management: pain level controlled Vital Signs Assessment: post-procedure vital signs reviewed and stable Respiratory status: spontaneous breathing and respiratory function stable Cardiovascular status: blood pressure returned to baseline and stable Postop Assessment: no headache and no apparent nausea or vomiting Anesthetic complications: no Comments: Late entry   No notable events documented.   Last Vitals:  Vitals:   08/17/24 0935 08/17/24 1108  BP: 129/82 (!) 127/58  Pulse: 80 67  Resp: 16 19  Temp: 36.7 C 36.7 C  SpO2: 98% 98%    Last Pain:  Vitals:   08/17/24 1108  TempSrc: Axillary  PainSc: 0-No pain                 Yvonna [name]

## 2024-08-28 ENCOUNTER — Ambulatory Visit: Payer: Self-pay | Admitting: Internal Medicine

## 2024-10-25 ENCOUNTER — Other Ambulatory Visit (HOSPITAL_COMMUNITY): Payer: Self-pay | Admitting: Internal Medicine

## 2024-10-25 DIAGNOSIS — Z122 Encounter for screening for malignant neoplasm of respiratory organs: Secondary | ICD-10-CM

## 2024-11-02 ENCOUNTER — Telehealth: Payer: Self-pay

## 2024-11-02 NOTE — Telephone Encounter (Signed)
 Pt called stating that she is having issues with her reflux at night stating that it burns and that she is unable to go to sleep because of it. Pt is currently taking pantoprazole  twice daily and states that she believes it is causing her to have diarrhea. Pt asked if there was anything that she could take to help at night, I advised pt that she can take famotidine  otc at bedtime to try to get some relief. Pt is scheduled an ov with Dr. Shaaron on 11/29/2024. Please advise.

## 2024-11-02 NOTE — Telephone Encounter (Signed)
 Pt was made aware and verbalized understanding. Pt stated that she is taking pantoprazole  before breakfast and supper.

## 2024-11-02 NOTE — Telephone Encounter (Signed)
 Make sure she is taking pantoprazole  before breakfast and supper.  She can take famotidine  20mg  at bedtime.   No NSAIDs.

## 2024-11-29 ENCOUNTER — Ambulatory Visit: Admitting: Internal Medicine

## 2025-01-03 ENCOUNTER — Encounter: Payer: Self-pay | Admitting: *Deleted

## 2025-01-03 ENCOUNTER — Encounter: Payer: Self-pay | Admitting: Internal Medicine

## 2025-01-03 ENCOUNTER — Ambulatory Visit: Admitting: Internal Medicine

## 2025-01-03 VITALS — BP 136/77 | HR 71 | Temp 98.0°F | Ht 62.0 in | Wt 170.4 lb

## 2025-01-03 DIAGNOSIS — Z8711 Personal history of peptic ulcer disease: Secondary | ICD-10-CM

## 2025-01-03 DIAGNOSIS — R197 Diarrhea, unspecified: Secondary | ICD-10-CM

## 2025-01-03 DIAGNOSIS — R109 Unspecified abdominal pain: Secondary | ICD-10-CM | POA: Diagnosis not present

## 2025-01-03 DIAGNOSIS — K259 Gastric ulcer, unspecified as acute or chronic, without hemorrhage or perforation: Secondary | ICD-10-CM

## 2025-01-03 NOTE — H&P (View-Only) (Signed)
 "   Gastroenterology Progress Note    Primary Care Physician:  Austin Mutton, MD Primary Gastroenterologist:  Dr.   Pre-Procedure History & Physical: HPI:  [name] is a 64 y.o. 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  Biopsies negative for malignancy or H. pylori.  Was taking ibuprofen  and significant doses prescribed by her dentist for tooth abscesses.  She has refrained from taking NSAIDs; she has been on Protonix  twice daily along with Carafate  intermittently.  She continues to have a crampy diffuse abdominal pain and diarrhea-diarrhea is nonbloody.  She is not taking any more NSAIDs as restated.  In addition to taking ibuprofen  she has been on multiple doses of clindamycin  more recently amoxicillin  for the past couple of weeks.  She has additional bad teeth which need to be pulled out.  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  Did not require any additional therapy had some abdominal pain going back to May for which she had a CT which demonstrated no findings other than she status post extended right hemicolectomy.  Colonoscopy last year demonstrated only hyperplastic polyps; slated for 1 more screening examination in 10 years.  Distant history of colonic adenoma but she is placed back into the average risk pool.    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 Reoperation for her lymph node dissection revealed all nodes to be absolutely negative.    Cervicalgia    COPD (chronic obstructive pulmonary disease) (HCC)    Depression with anxiety    Dysphagia    Fibromyalgia    GERD  (gastroesophageal reflux disease)    IBS (irritable bowel syndrome)    Interstitial cystitis    Kidney stones    Migraine    Mitral valve prolapse    Pneumonia    1990's   PONV (postoperative nausea and vomiting)    PTSD (post-traumatic stress disorder)    Renal failure    RLS (restless legs syndrome)    Shortness of breath    Tobacco use disorder    UTI (lower urinary tract infection)     Past Surgical History:  Procedure Laterality Date   ABDOMINAL HYSTERECTOMY     ABDOMINAL SURGERY     APPENDECTOMY  01/05/2012   with incidental finding of carcinoid   BIOPSY  04/03/2022   Procedure: BIOPSY;  Surgeon: Shaaron Lamar HERO, MD;  [facility]: [facility];  Service: Endoscopy;;   BREAST BIOPSY Right 02/10/2023   Benign breast, predominantly mature adipose, with fibrocystic changes including stromal fibrosis and cystic dilatation of ducts/US  RT BREAST BX W LOC DEV 1ST LESION IMG BX SPEC US  GUIDE 02/10/2023 [facility]   CARDIAC CATHETERIZATION     no stents needed   CARDIAC SURGERY     CHOLECYSTECTOMY     COLON RESECTION  01/30/2012   Procedure: HAND ASSISTED LAPAROSCOPIC COLON RESECTION;  Surgeon: Oneil DELENA Budge, MD;  Location: [facility];  Service: General;  Laterality: N/A;   COLON SURGERY     COLONOSCOPY N/A 08/17/2024   Procedure: COLONOSCOPY;  Surgeon: Shaaron Lamar HERO,  MD;  [facility]: [facility];  Service: Endoscopy;  Laterality: N/A;  11:00am, asa 3   COLONOSCOPY WITH PROPOFOL  N/A 09/29/2013   MFM:Mzrujo and colonic polyps-removed as described above Status post right hemicolectomy   COLONOSCOPY WITH PROPOFOL  N/A 03/15/2015   Procedure: COLONOSCOPY WITH PROPOFOL ;  Surgeon: Lamar CHRISTELLA Hollingshead, MD; normal exam.  Repeat in 5 years.   COLONOSCOPY WITH PROPOFOL  N/A 04/03/2022   Procedure: COLONOSCOPY WITH PROPOFOL ;  Surgeon: Hollingshead Lamar CHRISTELLA, MD;  [facility]: [facility];  Service: Endoscopy;  Laterality: N/A;  10:00am   CYSTOSTOMY W/ BLADDER DILATION     x 52 [place]   ESOPHAGEAL  DILATION  01/01/2012   RMR: Normal esophagus - status post dilation with 54 F [place] dilation, gastric polyps benign path   ESOPHAGEAL DILATION N/A 08/17/2024   Procedure: DILATION, ESOPHAGUS;  Surgeon: Hollingshead Lamar CHRISTELLA, MD;  [facility]: [facility];  Service: Endoscopy;  Laterality: N/A;   ESOPHAGOGASTRODUODENOSCOPY N/A 08/17/2024   Procedure: EGD (ESOPHAGOGASTRODUODENOSCOPY);  Surgeon: Hollingshead Lamar CHRISTELLA, MD;  [facility]: [facility];  Service: Endoscopy;  Laterality: N/A;   ESOPHAGOGASTRODUODENOSCOPY (EGD) WITH PROPOFOL  N/A 09/29/2013   MFM:Wnwrmpuprjo appearing Schatzki's ring-status post dilation as described above. Hiatal hernia.   ESOPHAGOGASTRODUODENOSCOPY (EGD) WITH PROPOFOL  N/A 04/03/2022   Procedure: ESOPHAGOGASTRODUODENOSCOPY (EGD) WITH PROPOFOL ;  Surgeon: Hollingshead Lamar CHRISTELLA, MD;  [facility];  Service: Endoscopy;  Laterality: N/A;   GIVENS CAPSULE STUDY  01/27/2012   normal   HAND SURGERY Bilateral    HEMICOLECTOMY     ileo-colonoscopy  01/01/2012   RMR: multiple colonic polyps, largest polyp in proximal sigmoid colon. Tubulovillous adenoma with high grade dysplasia, tubular adenoma   LAPAROSCOPIC APPENDECTOMY  01/05/2012   Procedure: APPENDECTOMY LAPAROSCOPIC;  Surgeon: Oneil DELENA Budge;  Location: [facility];  Service: General;  Laterality: N/A;   MALONEY DILATION N/A 09/29/2013   Procedure: AGAPITO DILATION;  Surgeon: Lamar CHRISTELLA Hollingshead, MD;  Location: [facility];  Service: Endoscopy;  Laterality: N/A;  54   MALONEY DILATION N/A 04/03/2022   Procedure: AGAPITO DILATION;  Surgeon: Hollingshead Lamar CHRISTELLA, MD;  [facility]: [facility];  Service: Endoscopy;  Laterality: N/A;   NECK SURGERY  2014   PARTIAL COLECTOMY  01/30/2012   Procedure: PARTIAL COLECTOMY;  Surgeon: Oneil DELENA Budge, MD;  Location: [facility];  Service: General;  Laterality: N/A;   POLYPECTOMY N/A 09/29/2013   Procedure: POLYPECTOMY;  Surgeon: Lamar CHRISTELLA Hollingshead, MD;  Location: [facility];  Service: Endoscopy;  Laterality: N/A;    POLYPECTOMY  04/03/2022   Procedure: POLYPECTOMY;  Surgeon: Hollingshead Lamar CHRISTELLA, MD;  [facility]: [facility];  Service: Endoscopy;;   TONSILLECTOMY      Prior to Admission medications  Medication Sig Start Date End Date Taking? Authorizing Provider  albuterol  (VENTOLIN  HFA) 108 (90 Base) MCG/ACT inhaler Inhale 2 puffs into the lungs 4 (four) times daily.   Yes [provider]  amoxicillin  (AMOXIL ) 500 MG tablet Take 500 mg by mouth 3 (three) times daily. 12/13/24  Yes [provider]  amphetamine -dextroamphetamine  (ADDERALL) 10 MG tablet Take 5-10 mg by mouth daily. For attention 10/31/11  Yes [provider]  cetirizine (ZYRTEC) 10 MG tablet Take 10 mg by mouth every morning.    Yes [provider]  Cholecalciferol (VITAMIN D3) 2000 units TABS Take 2,000 Units by mouth daily.   Yes [provider]  clindamycin  (CLEOCIN ) 300 MG capsule Take 300 mg by mouth 3 (three) times daily.   Yes [provider]  estradiol  (ESTRACE ) 2 MG tablet Take 2 mg by mouth daily. 09/08/16  Yes [provider]  furosemide (LASIX) 40 MG tablet Take 40 mg by mouth daily.    Yes [provider]  LORazepam  (ATIVAN ) 0.5 MG tablet Take 0.5 mg by mouth 2 (two) times daily as needed. 09/26/24  Yes [provider]  oxyCODONE -acetaminophen  (PERCOCET) 10-325 MG tablet Take 1 tablet by mouth 4 (four) times daily. 12/19/24  Yes [provider]  pantoprazole  (PROTONIX ) 40 MG tablet Take 1 tablet (40 mg total) by mouth 2 (two) times daily before a meal. 08/17/24  Yes Kemyah Buser, Lamar HERO, MD  potassium chloride  SA (K-DUR,KLOR-CON ) 20 MEQ tablet Take 20 mEq by mouth daily.  08/19/13  Yes [provider]  pramipexole  (MIRAPEX ) 1.5 MG tablet Take 1.5 mg by mouth daily. 10/04/22  Yes [provider]  sucralfate  (CARAFATE ) 1 g tablet Take 1 g by mouth 4 (four) times daily.   Yes [provider]  valACYclovir  (VALTREX ) 500 MG tablet  SMARTSIG:4 Tablet(s) By Mouth   Yes [provider]    Allergies as of 01/03/2025 - Review Complete 01/03/2025  Allergen Reaction Noted   Ativan  [lorazepam ] Other (See Comments) 01/29/2012   Prochlorperazine edisylate Anaphylaxis, Anxiety, Dermatitis, and Other (See Comments) 11/23/2020   Promethazine hcl Anaphylaxis, Anxiety, and Dermatitis 11/23/2020   Trimethobenzamide hcl Anaphylaxis and Anxiety 11/23/2020   Butorphanol Nausea And Vomiting and Nausea Only 07/19/2012   Clarithromycin Itching, Nausea And Vomiting, and Nausea Only 10/15/2021   Ibuprofen  Other (See Comments) 01/08/2013   Pseudoephedrine Hives    Reglan  [metoclopramide ] Other (See Comments) 06/14/2013   Stadol [butorphanol tartrate] Nausea And Vomiting 07/19/2012   Sulfa antibiotics Nausea And Vomiting 08/28/2011   Toradol  [ketorolac  tromethamine ] Other (See Comments) 07/30/2012   Factive [gemifloxacin mesylate] Rash 08/28/2011   Other Rash 07/19/2012   Tofranil-pm Rash 01/02/2012    Family History  Problem Relation Age of Onset   Coronary artery disease Mother    Heart attack Mother    Pulmonary embolism Mother    Alcohol abuse Father    COPD Father    Lung disease Father    Asthma Father    Colon cancer Sister        65   Cancer Sister        oral cancer   Anesthesia problems Neg Hx    Hypotension Neg Hx    Malignant hyperthermia Neg Hx    Pseudochol deficiency Neg Hx    Other Neg Hx    Allergic rhinitis Neg Hx    Angioedema Neg Hx    Atopy Neg Hx    Eczema Neg Hx    Immunodeficiency Neg Hx    Urticaria Neg Hx     Social History   Socioeconomic History   Marital status: Married    Spouse name: Not on file   Number of children: 1   Years of education: Not on file   Highest education level: Not on file  Occupational History   Occupation: disabled    Employer: UNEMPLOYED  Tobacco Use   Smoking status: Former    Current packs/day: 0.10    Average packs/day: 0.1 packs/day for 7.0  years (0.7 ttl pk-yrs)    Types: E-cigarettes, Cigarettes   Smokeless tobacco: Current   Tobacco comments:    Vapes occasionally  Vaping Use   Vaping status: Some Days  Substance and Sexual Activity   Alcohol use: No   Drug use: No   Sexual  activity: Yes    Birth control/protection: Surgical  Other Topics Concern   Not on file  Social History Narrative   Not on file   Social Drivers of Health   Tobacco Use: High Risk (01/03/2025)   Patient History    Smoking Tobacco Use: Former    Smokeless Tobacco Use: Current    Passive Exposure: Not on [place] Strain: Not on file  Food Insecurity: Not on file  Transportation Needs: Not on file  Physical Activity: Not on file  Stress: Not on file  Social Connections: Not on file  Intimate Partner Violence: Not on file  Depression (PHQ2-9): Not on file  Alcohol Screen: Not on file  Housing: Not on file  Utilities: Not on file  Health Literacy: Not on file    Review of Systems   See HPI, otherwise negative ROS  Physical Exam: BP 136/77   Pulse 71   Temp 98 F (36.7 C) (Oral)   Ht 5' 2 (1.575 m)   Wt 170 lb 6.4 oz (77.3 kg)   LMP 12/29/1993   SpO2 95%   BMI 31.17 kg/m  General:   Alert,  Well-developed, well-nourished, pleasant and cooperative in NAD; she is accompanied by her husband. Neck:  Supple; no masses or thyromegaly. No significant cervical adenopathy. Lungs:  Clear throughout to auscultation.   No wheezes, crackles, or rhonchi. No acute distress. Heart:  Regular rate and rhythm; no murmurs, clicks, rubs,  or gallops. Abdomen: Non-distended, normal bowel sounds.  Soft and nontender without appreciable mass or hepatosplenomegaly.   Impression/Plan:   64 year old lady with a distant history of appendiceal carcinoid limited stage removed with appendectomy presented with abdominal pain last year off and on the setting of ibuprofen  use and periodontal abscesses.  EGD in August of last year demonstrated  multiple pyloric channel ulcers ulcers were biopsied all gastric mucosa no H. pylori or malignancy.  She stopped taking ibuprofen  and is been maintained on twice daily Protonix  and Carafate   However since August she has had extensive dental issues requiring long courses of clindamycin  and more currently amoxicillin  along the way she developed abdominal cramps and diarrhea.  In this setting, have to wonder about the possibility of C. difficile infection on top of everything else.  Nothing to suggest recurrent carcinoid or carcinoid syndrome.  He would be unusual for peptic ulcer disease to produce persistent symptoms as received here.  Moreover, diarrhea not make much sense in this setting.  I doubt were dealing with hypersecretory syndrome.  C. difficile infection may be an issue here and not peptic ulcer disease although she certainly needs a surveillance EGD to document healing  Recommendations:  Lets get a C. difficile toxin screen on your stool  We will move toward scheduling an upper endoscopy to follow-up on the gastric ulcer previously seen (ASA 3) room 1 okay.  Continue Protonix  40 mg twice daily and Carafate  as prescribed by your primary care doctor  Continue to avoid all nonsteroidal type medications.  Further recommendations to follow.  Notice: This dictation was prepared with Dragon dictation along with smaller phrase technology. Any transcriptional errors that result from this process are unintentional and may not be corrected upon review.  "

## 2025-01-03 NOTE — Patient Instructions (Addendum)
 Nice to see you again today  Lets get a C. difficile toxin screen on your stool  We will move toward scheduling an upper endoscopy to follow-up on the gastric ulcer previously seen (ASA 3) room 1 okay.  Continue Protonix  40 mg twice daily and Carafate  as prescribed by your primary care doctor  Continue to avoid all nonsteroidal type medications.  Further recommendations to follow.

## 2025-01-03 NOTE — Progress Notes (Signed)
 "   Gastroenterology Progress Note    Primary Care Physician:  Austin Mutton, MD Primary Gastroenterologist:  Dr.   Pre-Procedure History & Physical: HPI:  [name] is a 64 y.o. 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  Biopsies negative for malignancy or H. pylori.  Was taking ibuprofen  and significant doses prescribed by her dentist for tooth abscesses.  She has refrained from taking NSAIDs; she has been on Protonix  twice daily along with Carafate  intermittently.  She continues to have a crampy diffuse abdominal pain and diarrhea-diarrhea is nonbloody.  She is not taking any more NSAIDs as restated.  In addition to taking ibuprofen  she has been on multiple doses of clindamycin  more recently amoxicillin  for the past couple of weeks.  She has additional bad teeth which need to be pulled out.  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  Did not require any additional therapy had some abdominal pain going back to May for which she had a CT which demonstrated no findings other than she status post extended right hemicolectomy.  Colonoscopy last year demonstrated only hyperplastic polyps; slated for 1 more screening examination in 10 years.  Distant history of colonic adenoma but she is placed back into the average risk pool.    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 Reoperation for her lymph node dissection revealed all nodes to be absolutely negative.    Cervicalgia    COPD (chronic obstructive pulmonary disease) (HCC)    Depression with anxiety    Dysphagia    Fibromyalgia    GERD  (gastroesophageal reflux disease)    IBS (irritable bowel syndrome)    Interstitial cystitis    Kidney stones    Migraine    Mitral valve prolapse    Pneumonia    1990's   PONV (postoperative nausea and vomiting)    PTSD (post-traumatic stress disorder)    Renal failure    RLS (restless legs syndrome)    Shortness of breath    Tobacco use disorder    UTI (lower urinary tract infection)     Past Surgical History:  Procedure Laterality Date   ABDOMINAL HYSTERECTOMY     ABDOMINAL SURGERY     APPENDECTOMY  01/05/2012   with incidental finding of carcinoid   BIOPSY  04/03/2022   Procedure: BIOPSY;  Surgeon: Shaaron Lamar HERO, MD;  [facility]: [facility];  Service: Endoscopy;;   BREAST BIOPSY Right 02/10/2023   Benign breast, predominantly mature adipose, with fibrocystic changes including stromal fibrosis and cystic dilatation of ducts/US  RT BREAST BX W LOC DEV 1ST LESION IMG BX SPEC US  GUIDE 02/10/2023 [facility]   CARDIAC CATHETERIZATION     no stents needed   CARDIAC SURGERY     CHOLECYSTECTOMY     COLON RESECTION  01/30/2012   Procedure: HAND ASSISTED LAPAROSCOPIC COLON RESECTION;  Surgeon: Oneil DELENA Budge, MD;  Location: [facility];  Service: General;  Laterality: N/A;   COLON SURGERY     COLONOSCOPY N/A 08/17/2024   Procedure: COLONOSCOPY;  Surgeon: Shaaron Lamar HERO,  MD;  [facility]: [facility];  Service: Endoscopy;  Laterality: N/A;  11:00am, asa 3   COLONOSCOPY WITH PROPOFOL  N/A 09/29/2013   MFM:Mzrujo and colonic polyps-removed as described above Status post right hemicolectomy   COLONOSCOPY WITH PROPOFOL  N/A 03/15/2015   Procedure: COLONOSCOPY WITH PROPOFOL ;  Surgeon: Lamar CHRISTELLA Hollingshead, MD; normal exam.  Repeat in 5 years.   COLONOSCOPY WITH PROPOFOL  N/A 04/03/2022   Procedure: COLONOSCOPY WITH PROPOFOL ;  Surgeon: Hollingshead Lamar CHRISTELLA, MD;  [facility]: [facility];  Service: Endoscopy;  Laterality: N/A;  10:00am   CYSTOSTOMY W/ BLADDER DILATION     x 52 [place]   ESOPHAGEAL  DILATION  01/01/2012   RMR: Normal esophagus - status post dilation with 54 F [place] dilation, gastric polyps benign path   ESOPHAGEAL DILATION N/A 08/17/2024   Procedure: DILATION, ESOPHAGUS;  Surgeon: Hollingshead Lamar CHRISTELLA, MD;  [facility];  Service: Endoscopy;  Laterality: N/A;   ESOPHAGOGASTRODUODENOSCOPY N/A 08/17/2024   Procedure: EGD (ESOPHAGOGASTRODUODENOSCOPY);  Surgeon: Hollingshead Lamar CHRISTELLA, MD;  [facility];  Service: Endoscopy;  Laterality: N/A;   ESOPHAGOGASTRODUODENOSCOPY (EGD) WITH PROPOFOL  N/A 09/29/2013   MFM:Wnwrmpuprjo appearing Schatzki's ring-status post dilation as described above. Hiatal hernia.   ESOPHAGOGASTRODUODENOSCOPY (EGD) WITH PROPOFOL  N/A 04/03/2022   Procedure: ESOPHAGOGASTRODUODENOSCOPY (EGD) WITH PROPOFOL ;  Surgeon: Hollingshead Lamar CHRISTELLA, MD;  [facility];  Service: Endoscopy;  Laterality: N/A;   GIVENS CAPSULE STUDY  01/27/2012   normal   HAND SURGERY Bilateral    HEMICOLECTOMY     ileo-colonoscopy  01/01/2012   RMR: multiple colonic polyps, largest polyp in proximal sigmoid colon. Tubulovillous adenoma with high grade dysplasia, tubular adenoma   LAPAROSCOPIC APPENDECTOMY  01/05/2012   Procedure: APPENDECTOMY LAPAROSCOPIC;  Surgeon: Oneil DELENA Budge;  Location: [facility];  Service: General;  Laterality: N/A;   MALONEY DILATION N/A 09/29/2013   Procedure: AGAPITO DILATION;  Surgeon: Lamar CHRISTELLA Hollingshead, MD;  Location: [facility];  Service: Endoscopy;  Laterality: N/A;  54   MALONEY DILATION N/A 04/03/2022   Procedure: AGAPITO DILATION;  Surgeon: Hollingshead Lamar CHRISTELLA, MD;  [facility];  Service: Endoscopy;  Laterality: N/A;   NECK SURGERY  2014   PARTIAL COLECTOMY  01/30/2012   Procedure: PARTIAL COLECTOMY;  Surgeon: Oneil DELENA Budge, MD;  Location: [facility];  Service: General;  Laterality: N/A;   POLYPECTOMY N/A 09/29/2013   Procedure: POLYPECTOMY;  Surgeon: Lamar CHRISTELLA Hollingshead, MD;  Location: [facility];  Service: Endoscopy;  Laterality: N/A;    POLYPECTOMY  04/03/2022   Procedure: POLYPECTOMY;  Surgeon: Hollingshead Lamar CHRISTELLA, MD;  [facility]: [facility];  Service: Endoscopy;;   TONSILLECTOMY      Prior to Admission medications  Medication Sig Start Date End Date Taking? Authorizing Provider  albuterol  (VENTOLIN  HFA) 108 (90 Base) MCG/ACT inhaler Inhale 2 puffs into the lungs 4 (four) times daily.   Yes [provider]  amoxicillin  (AMOXIL ) 500 MG tablet Take 500 mg by mouth 3 (three) times daily. 12/13/24  Yes [provider]  amphetamine -dextroamphetamine  (ADDERALL) 10 MG tablet Take 5-10 mg by mouth daily. For attention 10/31/11  Yes [provider]  cetirizine (ZYRTEC) 10 MG tablet Take 10 mg by mouth every morning.    Yes [provider]  Cholecalciferol (VITAMIN D3) 2000 units TABS Take 2,000 Units by mouth daily.   Yes [provider]  clindamycin  (CLEOCIN ) 300 MG capsule Take 300 mg by mouth 3 (three) times daily.   Yes [provider]  estradiol  (ESTRACE ) 2 MG tablet Take 2 mg by mouth daily. 09/08/16  Yes [provider]  furosemide (LASIX) 40 MG tablet Take 40 mg by mouth daily.    Yes [provider]  LORazepam  (ATIVAN ) 0.5 MG tablet Take 0.5 mg by mouth 2 (two) times daily as needed. 09/26/24  Yes [provider]  oxyCODONE -acetaminophen  (PERCOCET) 10-325 MG tablet Take 1 tablet by mouth 4 (four) times daily. 12/19/24  Yes [provider]  pantoprazole  (PROTONIX ) 40 MG tablet Take 1 tablet (40 mg total) by mouth 2 (two) times daily before a meal. 08/17/24  Yes Kemyah Buser, Lamar HERO, MD  potassium chloride  SA (K-DUR,KLOR-CON ) 20 MEQ tablet Take 20 mEq by mouth daily.  08/19/13  Yes [provider]  pramipexole  (MIRAPEX ) 1.5 MG tablet Take 1.5 mg by mouth daily. 10/04/22  Yes [provider]  sucralfate  (CARAFATE ) 1 g tablet Take 1 g by mouth 4 (four) times daily.   Yes [provider]  valACYclovir  (VALTREX ) 500 MG tablet  SMARTSIG:4 Tablet(s) By Mouth   Yes [provider]    Allergies as of 01/03/2025 - Review Complete 01/03/2025  Allergen Reaction Noted   Ativan  [lorazepam ] Other (See Comments) 01/29/2012   Prochlorperazine edisylate Anaphylaxis, Anxiety, Dermatitis, and Other (See Comments) 11/23/2020   Promethazine hcl Anaphylaxis, Anxiety, and Dermatitis 11/23/2020   Trimethobenzamide hcl Anaphylaxis and Anxiety 11/23/2020   Butorphanol Nausea And Vomiting and Nausea Only 07/19/2012   Clarithromycin Itching, Nausea And Vomiting, and Nausea Only 10/15/2021   Ibuprofen  Other (See Comments) 01/08/2013   Pseudoephedrine Hives    Reglan  [metoclopramide ] Other (See Comments) 06/14/2013   Stadol [butorphanol tartrate] Nausea And Vomiting 07/19/2012   Sulfa antibiotics Nausea And Vomiting 08/28/2011   Toradol  [ketorolac  tromethamine ] Other (See Comments) 07/30/2012   Factive [gemifloxacin mesylate] Rash 08/28/2011   Other Rash 07/19/2012   Tofranil-pm Rash 01/02/2012    Family History  Problem Relation Age of Onset   Coronary artery disease Mother    Heart attack Mother    Pulmonary embolism Mother    Alcohol abuse Father    COPD Father    Lung disease Father    Asthma Father    Colon cancer Sister        65   Cancer Sister        oral cancer   Anesthesia problems Neg Hx    Hypotension Neg Hx    Malignant hyperthermia Neg Hx    Pseudochol deficiency Neg Hx    Other Neg Hx    Allergic rhinitis Neg Hx    Angioedema Neg Hx    Atopy Neg Hx    Eczema Neg Hx    Immunodeficiency Neg Hx    Urticaria Neg Hx     Social History   Socioeconomic History   Marital status: Married    Spouse name: Not on file   Number of children: 1   Years of education: Not on file   Highest education level: Not on file  Occupational History   Occupation: disabled    Employer: UNEMPLOYED  Tobacco Use   Smoking status: Former    Current packs/day: 0.10    Average packs/day: 0.1 packs/day for 7.0  years (0.7 ttl pk-yrs)    Types: E-cigarettes, Cigarettes   Smokeless tobacco: Current   Tobacco comments:    Vapes occasionally  Vaping Use   Vaping status: Some Days  Substance and Sexual Activity   Alcohol use: No   Drug use: No   Sexual  activity: Yes    Birth control/protection: Surgical  Other Topics Concern   Not on file  Social History Narrative   Not on file   Social Drivers of Health   Tobacco Use: High Risk (01/03/2025)   Patient History    Smoking Tobacco Use: Former    Smokeless Tobacco Use: Current    Passive Exposure: Not on [place] Strain: Not on file  Food Insecurity: Not on file  Transportation Needs: Not on file  Physical Activity: Not on file  Stress: Not on file  Social Connections: Not on file  Intimate Partner Violence: Not on file  Depression (PHQ2-9): Not on file  Alcohol Screen: Not on file  Housing: Not on file  Utilities: Not on file  Health Literacy: Not on file    Review of Systems   See HPI, otherwise negative ROS  Physical Exam: BP 136/77   Pulse 71   Temp 98 F (36.7 C) (Oral)   Ht 5' 2 (1.575 m)   Wt 170 lb 6.4 oz (77.3 kg)   LMP 12/29/1993   SpO2 95%   BMI 31.17 kg/m  General:   Alert,  Well-developed, well-nourished, pleasant and cooperative in NAD; she is accompanied by her husband. Neck:  Supple; no masses or thyromegaly. No significant cervical adenopathy. Lungs:  Clear throughout to auscultation.   No wheezes, crackles, or rhonchi. No acute distress. Heart:  Regular rate and rhythm; no murmurs, clicks, rubs,  or gallops. Abdomen: Non-distended, normal bowel sounds.  Soft and nontender without appreciable mass or hepatosplenomegaly.   Impression/Plan:   64 year old lady with a distant history of appendiceal carcinoid limited stage removed with appendectomy presented with abdominal pain last year off and on the setting of ibuprofen  use and periodontal abscesses.  EGD in August of last year demonstrated  multiple pyloric channel ulcers ulcers were biopsied all gastric mucosa no H. pylori or malignancy.  She stopped taking ibuprofen  and is been maintained on twice daily Protonix  and Carafate   However since August she has had extensive dental issues requiring long courses of clindamycin  and more currently amoxicillin  along the way she developed abdominal cramps and diarrhea.  In this setting, have to wonder about the possibility of C. difficile infection on top of everything else.  Nothing to suggest recurrent carcinoid or carcinoid syndrome.  He would be unusual for peptic ulcer disease to produce persistent symptoms as received here.  Moreover, diarrhea not make much sense in this setting.  I doubt were dealing with hypersecretory syndrome.  C. difficile infection may be an issue here and not peptic ulcer disease although she certainly needs a surveillance EGD to document healing  Recommendations:  Lets get a C. difficile toxin screen on your stool  We will move toward scheduling an upper endoscopy to follow-up on the gastric ulcer previously seen (ASA 3) room 1 okay.  Continue Protonix  40 mg twice daily and Carafate  as prescribed by your primary care doctor  Continue to avoid all nonsteroidal type medications.  Further recommendations to follow.  Notice: This dictation was prepared with Dragon dictation along with smaller phrase technology. Any transcriptional errors that result from this process are unintentional and may not be corrected upon review.  "

## 2025-01-08 ENCOUNTER — Ambulatory Visit: Payer: Self-pay | Admitting: Internal Medicine

## 2025-01-08 LAB — C DIFFICILE, CYTOTOXIN B

## 2025-01-08 LAB — C DIFFICILE TOXINS A+B W/RFLX: C difficile Toxins A+B, EIA: NEGATIVE

## 2025-01-13 ENCOUNTER — Encounter (HOSPITAL_COMMUNITY): Payer: Self-pay

## 2025-01-13 ENCOUNTER — Other Ambulatory Visit: Payer: Self-pay

## 2025-01-13 ENCOUNTER — Encounter (HOSPITAL_COMMUNITY)
Admission: RE | Admit: 2025-01-13 | Discharge: 2025-01-13 | Disposition: A | Discharge status: Home / Self Care | Source: Ambulatory Visit | Attending: Internal Medicine | Admitting: Internal Medicine

## 2025-01-18 ENCOUNTER — Ambulatory Visit (HOSPITAL_COMMUNITY)
Admission: RE | Admit: 2025-01-18 | Discharge: 2025-01-18 | Disposition: A | Discharge status: Home / Self Care | Attending: Internal Medicine | Admitting: Internal Medicine

## 2025-01-18 ENCOUNTER — Ambulatory Visit (HOSPITAL_COMMUNITY): Admitting: Anesthesiology

## 2025-01-18 ENCOUNTER — Encounter (HOSPITAL_COMMUNITY): Payer: Self-pay | Admitting: Internal Medicine

## 2025-01-18 ENCOUNTER — Encounter (HOSPITAL_COMMUNITY): Admission: RE | Disposition: A | Payer: Self-pay | Source: Home / Self Care | Attending: Internal Medicine

## 2025-01-18 DIAGNOSIS — Z9049 Acquired absence of other specified parts of digestive tract: Secondary | ICD-10-CM | POA: Diagnosis not present

## 2025-01-18 DIAGNOSIS — G709 Myoneural disorder, unspecified: Secondary | ICD-10-CM | POA: Insufficient documentation

## 2025-01-18 DIAGNOSIS — J449 Chronic obstructive pulmonary disease, unspecified: Secondary | ICD-10-CM | POA: Diagnosis not present

## 2025-01-18 DIAGNOSIS — K259 Gastric ulcer, unspecified as acute or chronic, without hemorrhage or perforation: Secondary | ICD-10-CM | POA: Insufficient documentation

## 2025-01-18 DIAGNOSIS — N289 Disorder of kidney and ureter, unspecified: Secondary | ICD-10-CM | POA: Insufficient documentation

## 2025-01-18 DIAGNOSIS — R1310 Dysphagia, unspecified: Secondary | ICD-10-CM

## 2025-01-18 DIAGNOSIS — F418 Other specified anxiety disorders: Secondary | ICD-10-CM | POA: Diagnosis not present

## 2025-01-18 DIAGNOSIS — M797 Fibromyalgia: Secondary | ICD-10-CM | POA: Diagnosis not present

## 2025-01-18 DIAGNOSIS — Z825 Family history of asthma and other chronic lower respiratory diseases: Secondary | ICD-10-CM | POA: Insufficient documentation

## 2025-01-18 DIAGNOSIS — Z800 Family history of malignant neoplasm of digestive organs: Secondary | ICD-10-CM | POA: Diagnosis not present

## 2025-01-18 DIAGNOSIS — R1314 Dysphagia, pharyngoesophageal phase: Secondary | ICD-10-CM | POA: Insufficient documentation

## 2025-01-18 DIAGNOSIS — K319 Disease of stomach and duodenum, unspecified: Secondary | ICD-10-CM | POA: Insufficient documentation

## 2025-01-18 DIAGNOSIS — Z79899 Other long term (current) drug therapy: Secondary | ICD-10-CM | POA: Insufficient documentation

## 2025-01-18 DIAGNOSIS — K219 Gastro-esophageal reflux disease without esophagitis: Secondary | ICD-10-CM | POA: Diagnosis not present

## 2025-01-18 DIAGNOSIS — Z860101 Personal history of adenomatous and serrated colon polyps: Secondary | ICD-10-CM | POA: Diagnosis not present

## 2025-01-18 DIAGNOSIS — K279 Peptic ulcer, site unspecified, unspecified as acute or chronic, without hemorrhage or perforation: Secondary | ICD-10-CM | POA: Diagnosis present

## 2025-01-18 DIAGNOSIS — Z13810 Encounter for screening for upper gastrointestinal disorder: Secondary | ICD-10-CM | POA: Diagnosis not present

## 2025-01-18 DIAGNOSIS — K3189 Other diseases of stomach and duodenum: Secondary | ICD-10-CM

## 2025-01-18 DIAGNOSIS — Z87891 Personal history of nicotine dependence: Secondary | ICD-10-CM | POA: Insufficient documentation

## 2025-01-18 DIAGNOSIS — K5710 Diverticulosis of small intestine without perforation or abscess without bleeding: Secondary | ICD-10-CM | POA: Diagnosis not present

## 2025-01-18 DIAGNOSIS — M1990 Unspecified osteoarthritis, unspecified site: Secondary | ICD-10-CM | POA: Diagnosis not present

## 2025-01-18 DIAGNOSIS — Z560 Unemployment, unspecified: Secondary | ICD-10-CM | POA: Diagnosis not present

## 2025-01-18 HISTORY — PX: ESOPHAGOGASTRODUODENOSCOPY: SHX5428

## 2025-01-18 HISTORY — PX: MALONEY DILATION: SHX5535

## 2025-01-18 MED ORDER — LACTATED RINGERS IV SOLN: INTRAVENOUS | Status: DC | PRN

## 2025-01-18 MED ORDER — ALUM & MAG HYDROXIDE-SIMETH 200-200-20 MG/5ML PO SUSP
30.0000 mL | Freq: Once | ORAL | Status: DC
  Filled 2025-01-18: qty 30

## 2025-01-18 MED ORDER — PHENYLEPHRINE 80 MCG/ML (10ML) SYRINGE FOR IV PUSH (FOR BLOOD PRESSURE SUPPORT)
PREFILLED_SYRINGE | INTRAVENOUS | Status: AC
  Filled 2025-01-18: qty 20

## 2025-01-18 MED ORDER — MIDAZOLAM HCL 2 MG/2ML IJ SOLN
INTRAMUSCULAR | Status: AC
  Filled 2025-01-18: qty 2

## 2025-01-18 MED ORDER — PROPOFOL 500 MG/50ML IV EMUL
INTRAVENOUS | Status: DC | PRN
  Administered 2025-01-18: 200 ug/kg/min via INTRAVENOUS

## 2025-01-18 MED ORDER — LIDOCAINE 2% (20 MG/ML) 5 ML SYRINGE
INTRAMUSCULAR | Status: AC
  Filled 2025-01-18: qty 10

## 2025-01-18 MED ORDER — PROPOFOL 10 MG/ML IV BOLUS
INTRAVENOUS | Status: DC | PRN
  Administered 2025-01-18: 100 mg via INTRAVENOUS

## 2025-01-18 MED ORDER — PHENYLEPHRINE 80 MCG/ML (10ML) SYRINGE FOR IV PUSH (FOR BLOOD PRESSURE SUPPORT)
PREFILLED_SYRINGE | INTRAVENOUS | Status: DC | PRN
  Administered 2025-01-18: 160 ug via INTRAVENOUS
  Administered 2025-01-18: 80 ug via INTRAVENOUS

## 2025-01-18 MED ORDER — PROPOFOL 1000 MG/100ML IV EMUL
INTRAVENOUS | Status: AC
  Filled 2025-01-18: qty 200

## 2025-01-18 MED ORDER — MIDAZOLAM HCL (PF) 2 MG/2ML IJ SOLN
INTRAMUSCULAR | Status: DC | PRN
  Administered 2025-01-18: 2 mg via INTRAVENOUS

## 2025-01-18 MED ORDER — LIDOCAINE 2% (20 MG/ML) 5 ML SYRINGE
INTRAMUSCULAR | Status: DC | PRN
  Administered 2025-01-18: 80 mg via INTRAVENOUS

## 2025-01-18 MED ORDER — LACTATED RINGERS IV SOLN: INTRAVENOUS | Status: DC

## 2025-01-18 NOTE — Discharge Instructions (Signed)
 EGD Discharge instructions Please read the instructions outlined below and refer to this sheet in the next few weeks. These discharge instructions provide you with general information on caring for yourself after you leave the hospital. Your doctor may also give you specific instructions. While your treatment has been planned according to the most current medical practices available, unavoidable complications occasionally occur. If you have any problems or questions after discharge, please call your doctor. ACTIVITY You may resume your regular activity but move at a slower pace for the next 24 hours.  Take frequent rest periods for the next 24 hours.  Walking will help expel (get rid of) the air and reduce the bloated feeling in your abdomen.  No driving for 24 hours (because of the anesthesia (medicine) used during the test).  You may shower.  Do not sign any important legal documents or operate any machinery for 24 hours (because of the anesthesia used during the test).  NUTRITION Drink plenty of fluids.  You may resume your normal diet.  Begin with a light meal and progress to your normal diet.  Avoid alcoholic beverages for 24 hours or as instructed by your caregiver.  MEDICATIONS You may resume your normal medications unless your caregiver tells you otherwise.  WHAT YOU CAN EXPECT TODAY You may experience abdominal discomfort such as a feeling of fullness or gas pains.  FOLLOW-UP Your doctor will discuss the results of your test with you.  SEEK IMMEDIATE MEDICAL ATTENTION IF ANY OF THE FOLLOWING OCCUR: Excessive nausea (feeling sick to your stomach) and/or vomiting.  Severe abdominal pain and distention (swelling).  Trouble swallowing.  Temperature over 101 F (37.8 C).  Rectal bleeding or vomiting of blood.      stomach ulcer persist.  Biopsies taken.  Your esophagus was stretched today   fasting serum gastrin level this morning   further recommendations to follow pending  review of lab report   please make sure that you are not taking any form of NSAID like ibuprofen  aspirin Motrin  or headache powders containing aspirin   continue Protonix  40 mg twice daily   further recommendations to follow pending review of pathology report   at patient request, I called Darina at [phone]-reviewed findings and recommendations

## 2025-01-18 NOTE — Anesthesia Preprocedure Evaluation (Signed)
"                                    Anesthesia Evaluation  Patient identified by MRN, date of birth, ID band Patient awake    Reviewed: Allergy & Precautions, H&P , NPO status , Patient's Chart, lab work & pertinent test results  History of Anesthesia Complications (+) PONV and history of anesthetic complications  Airway Mallampati: II  TM Distance: >3 FB Neck ROM: Full    Dental  (+) Edentulous Lower, Edentulous Upper   Pulmonary shortness of breath, asthma , pneumonia, COPD, former smoker   Pulmonary exam normal breath sounds clear to auscultation       Cardiovascular negative cardio ROS Normal cardiovascular exam Rhythm:Regular Rate:Normal     Neuro/Psych  Headaches PSYCHIATRIC DISORDERS Anxiety Depression     Neuromuscular disease    GI/Hepatic Neg liver ROS,GERD  ,,  Endo/Other  negative endocrine ROS    Renal/GU Renal disease  negative genitourinary   Musculoskeletal  (+) Arthritis ,  Fibromyalgia -  Abdominal   Peds negative pediatric ROS (+)  Hematology negative hematology ROS (+)   Anesthesia Other Findings   Reproductive/Obstetrics negative OB ROS                              Anesthesia Physical Anesthesia Plan  ASA: 2  Anesthesia Plan: General   Post-op Pain Management:    Induction: Intravenous  PONV Risk Score and Plan:   Airway Management Planned: Nasal Cannula and Natural Airway  Additional Equipment:   Intra-op Plan:   Post-operative Plan:   Informed Consent: I have reviewed the patients History and Physical, chart, labs and discussed the procedure including the risks, benefits and alternatives for the proposed anesthesia with the patient or authorized representative who has indicated his/her understanding and acceptance.     Dental advisory given  Plan Discussed with: CRNA  Anesthesia Plan Comments:         Anesthesia Quick Evaluation  "

## 2025-01-18 NOTE — Anesthesia Postprocedure Evaluation (Signed)
"   Anesthesia Post Note  Patient: KINZA GOUVEIA  Procedure(s) Performed: EGD (ESOPHAGOGASTRODUODENOSCOPY) DILATION, ESOPHAGUS, USING MALONEY DILATOR  Patient location during evaluation: PACU Anesthesia Type: General Level of consciousness: awake and alert Pain management: pain level controlled Vital Signs Assessment: post-procedure vital signs reviewed and stable Respiratory status: spontaneous breathing, nonlabored ventilation, respiratory function stable and patient connected to nasal cannula oxygen  Cardiovascular status: blood pressure returned to baseline and stable Postop Assessment: no apparent nausea or vomiting Anesthetic complications: no   No notable events documented.   Last Vitals:  Vitals:   01/18/25 0718 01/18/25 0804  BP: (!) 137/90 (!) 186/63  Pulse: 76 (!) 58  Resp: 18 15  Temp: 36.6 C 36.5 C  SpO2: 100% 96%    Last Pain:  Vitals:   01/18/25 0804  TempSrc: Oral  PainSc: 0-No pain                 Andrea Limes      "

## 2025-01-18 NOTE — Transfer of Care (Signed)
 Immediate Anesthesia Transfer of Care Note  Patient: [name]  Procedure(s) Performed: EGD (ESOPHAGOGASTRODUODENOSCOPY) DILATION, ESOPHAGUS, USING MALONEY DILATOR  Patient Location: Short Stay  Anesthesia Type:General  Level of Consciousness: awake, alert , oriented, and patient cooperative  Airway & Oxygen  Therapy: Patient Spontanous Breathing  Post-op Assessment: Report given to RN, Post -op Vital signs reviewed and stable, and Patient moving all extremities X 4  Post vital signs: Reviewed and stable  Last Vitals:  Vitals Value Taken Time  BP 186/63 01/18/25 08:04  Temp 36.5 C 01/18/25 08:04  Pulse 58 01/18/25 08:04  Resp 15 01/18/25 08:04  SpO2 96 % 01/18/25 08:04    Last Pain:  Vitals:   01/18/25 0804  TempSrc: Oral  PainSc: 0-No pain      Patients Stated Pain Goal: 5 (01/18/25 0718)  Complications: No notable events documented.

## 2025-01-18 NOTE — Op Note (Signed)
 [facility] Patient Name: [name] Procedure Date: 01/18/2025 6:53 AM MRN: [id number] Date of Birth: [birth year] Attending MD: [name] , MD, [id number] CSN: [id number] Age: 64 Admit Type: Outpatient Procedure:                Upper GI endoscopy Indications:              Surveillance procedure, Esophageal ulcer, Peptic                            ulcer; esophageal dysphagia Providers:                [name], MD, Madelin Hunter, RN, Dorcas Lenis, Technician Referring MD:              Medicines:                Propofol  per Anesthesia Complications:            No immediate complications. Estimated Blood Loss:     Estimated blood loss was minimal. Procedure:                Pre-Anesthesia Assessment:                           - Prior to the procedure, a History and Physical                            was performed, and patient medications and                            allergies were reviewed. The patient's tolerance of                            previous anesthesia was also reviewed. The risks                            and benefits of the procedure and the sedation                            options and risks were discussed with the patient.                            All questions were answered, and informed consent                            was obtained. Prior Anticoagulants: The patient has                            taken no anticoagulant or antiplatelet agents. ASA                            Grade Assessment: III - A patient with severe  systemic disease. After reviewing the risks and                            benefits, the patient was deemed in satisfactory                            condition to undergo the procedure.                           After obtaining informed consent, the endoscope was                            passed under direct vision. Throughout the                             procedure, the patient's blood pressure, pulse, and                            oxygen  saturations were monitored continuously. The                            [id number] ([id number]) Upper was introduced through                            the mouth, and advanced to the second part of                            duodenum. The upper GI endoscopy was accomplished                            without difficulty. The patient tolerated the                            procedure well. Scope In: 7:47:36 AM Scope Out: 7:56:47 AM Total Procedure Duration: 0 hours 9 minutes 11 seconds  Findings:      The examined esophagus was normal. Again, patchy erythema in the gastric       mucosa noted. Persisting 6 to 7 mm prepyloric ulcer with a clean base.       Did not see anything suspicious for infiltrative process. Pylorus       patent. Examination bulb second portion revealed only a D2 diverticulum.       Scope withdrawn a 54 French Maloney dilator was passed full insertion       easily. Relook back revealed no apparent complication to this maneuver.       Finally, biopsies of the gastric ulcer and gastric mucosa were taken for       histologic study. Impression:               - Normal esophagus. Status post Maloney dilation                           - Persisting pyloric channel ulcerstatus post                            biopsy. Status post  gastric mucosal biopsy. Moderate Sedation:      Moderate (conscious) sedation was personally administered by an       anesthesia professional. The following parameters were monitored: oxygen        saturation, heart rate, blood pressure, respiratory rate, EKG, adequacy       of pulmonary ventilation, and response to care. Recommendation:           - Patient has a contact number available for                            emergencies. The signs and symptoms of potential                            delayed complications were discussed with the                             patient. Return to normal activities tomorrow.                            Written discharge instructions were provided to the                            patient.                           - Advance diet as tolerated.                           - Continue present medications. Continue twice                            daily Protonix . Fasting gastrin level this morning.                            Follow-up on pathology. Discussed findings with                            husband. He endorses no NSAIDs at home.                           - Office visit in 6 weeks Procedure Code(s):        --- Professional ---                           [phone], Esophagogastroduodenoscopy, flexible,                            transoral; diagnostic, including collection of                            specimen(s) by brushing or washing, when performed                            (separate procedure) Diagnosis Code(s):        --- Professional ---  K22.10, Ulcer of esophagus without bleeding                           K27.9, Peptic ulcer, site unspecified, unspecified                            as acute or chronic, without hemorrhage or                            perforation CPT copyright 2022 [employer]. All rights reserved. The codes documented in this report are preliminary and upon coder review may  be revised to meet current compliance requirements. [name], MD [name], MD 01/18/2025 8:23:07 AM This report has been signed electronically. Number of Addenda: 0

## 2025-01-18 NOTE — Interval H&P Note (Signed)
 History and Physical Interval Note:  01/18/2025 7:26 AM  [name]  has presented today for surgery, with the diagnosis of gastric ulcer.  The various methods of treatment have been discussed with the patient and family. After consideration of risks, benefits and other options for treatment, the patient has consented to  Procedures with comments: EGD (ESOPHAGOGASTRODUODENOSCOPY) (N/A) - 7:30 am, asa 3 (ok rm 1) as a surgical intervention.  The patient's history has been reviewed, patient examined, no change in status, stable for surgery.  I have reviewed the patient's chart and labs.  Questions were answered to the patient's satisfaction.     Magdala Brahmbhatt  No change.  Surveillance EGD per plan.  The risks, benefits, limitations, alternatives and imponderables have been reviewed with the patient. Potential for esophageal dilation, biopsy, etc. have also been reviewed.  Questions have been answered. All parties agreeable.

## 2025-01-18 NOTE — Interval H&P Note (Signed)
 History and Physical Interval Note:  01/18/2025 7:30 AM  [name]  has presented today for surgery, with the diagnosis of gastric ulcer.  The various methods of treatment have been discussed with the patient and family. After consideration of risks, benefits and other options for treatment, the patient has consented to  Procedures with comments: EGD (ESOPHAGOGASTRODUODENOSCOPY) (N/A) - 7:30 am, asa 3 (ok rm 1) as a surgical intervention.  The patient's history has been reviewed, patient examined, no change in status, stable for surgery.  I have reviewed the patient's chart and labs.  Questions were answered to the patient's satisfaction.     Azarian Starace  No change.  Diarrhea is only intermittent now abdominal pain has improved patient notes intermittent esophageal dysphagia to solids.  EGD today to follow-up on gastric ulcer possible esophageal dilation is feasible/appropriate per plan.  The risks, benefits, limitations, alternatives and imponderables have been reviewed with the patient. Potential for esophageal dilation, biopsy, etc. have also been reviewed.  Questions have been answered. All parties agreeable.

## 2025-01-19 ENCOUNTER — Encounter (HOSPITAL_COMMUNITY): Payer: Self-pay | Admitting: Internal Medicine

## 2025-01-19 ENCOUNTER — Ambulatory Visit: Payer: Self-pay | Admitting: Internal Medicine

## 2025-01-19 LAB — GASTRIN: Gastrin: 124 pg/mL — ABNORMAL HIGH (ref 0–115)

## 2025-01-19 LAB — SURGICAL PATHOLOGY

## 2025-03-02 ENCOUNTER — Ambulatory Visit: Admitting: Gastroenterology
